# Patient Record
Sex: Male | Born: 1945 | Race: White | Hispanic: No | Smoking: Never smoker
Health system: Southern US, Community
[De-identification: ages and names within clinical notes are randomized; demographics above are authoritative.]

## PROBLEM LIST (undated history)

## (undated) DIAGNOSIS — C9110 Chronic lymphocytic leukemia of B-cell type not having achieved remission: Secondary | ICD-10-CM

## (undated) DIAGNOSIS — N529 Male erectile dysfunction, unspecified: Secondary | ICD-10-CM

## (undated) DIAGNOSIS — I10 Essential (primary) hypertension: Secondary | ICD-10-CM

## (undated) DIAGNOSIS — M1990 Unspecified osteoarthritis, unspecified site: Secondary | ICD-10-CM

## (undated) DIAGNOSIS — I2699 Other pulmonary embolism without acute cor pulmonale: Secondary | ICD-10-CM

## (undated) DIAGNOSIS — D689 Coagulation defect, unspecified: Secondary | ICD-10-CM

## (undated) DIAGNOSIS — G8929 Other chronic pain: Secondary | ICD-10-CM

## (undated) DIAGNOSIS — M79671 Pain in right foot: Secondary | ICD-10-CM

## (undated) DIAGNOSIS — E785 Hyperlipidemia, unspecified: Secondary | ICD-10-CM

## (undated) DIAGNOSIS — I319 Disease of pericardium, unspecified: Secondary | ICD-10-CM

## (undated) DIAGNOSIS — Z86718 Personal history of other venous thrombosis and embolism: Secondary | ICD-10-CM

## (undated) DIAGNOSIS — I8390 Asymptomatic varicose veins of unspecified lower extremity: Secondary | ICD-10-CM

## (undated) DIAGNOSIS — I824Z9 Acute embolism and thrombosis of unspecified deep veins of unspecified distal lower extremity: Secondary | ICD-10-CM

## (undated) HISTORY — DX: Male erectile dysfunction, unspecified: N52.9

## (undated) HISTORY — DX: Coagulation defect, unspecified: D68.9

## (undated) HISTORY — DX: Disease of pericardium, unspecified: I31.9

## (undated) HISTORY — PX: INGUINAL HERNIA REPAIR: SUR1180

## (undated) HISTORY — DX: Personal history of other venous thrombosis and embolism: Z86.718

## (undated) HISTORY — DX: Essential (primary) hypertension: I10

## (undated) HISTORY — DX: Other chronic pain: G89.29

## (undated) HISTORY — DX: Other pulmonary embolism without acute cor pulmonale: I26.99

## (undated) HISTORY — PX: OTHER SURGICAL HISTORY: SHX169

## (undated) HISTORY — DX: Chronic lymphocytic leukemia of B-cell type not having achieved remission: C91.10

## (undated) HISTORY — DX: Pain in right foot: M79.671

## (undated) HISTORY — DX: Hyperlipidemia, unspecified: E78.5

## (undated) HISTORY — DX: Unspecified osteoarthritis, unspecified site: M19.90

## (undated) HISTORY — PX: COLONOSCOPY: SHX174

---

## 2004-10-15 ENCOUNTER — Ambulatory Visit: Payer: Self-pay | Admitting: Oncology

## 2004-10-16 ENCOUNTER — Ambulatory Visit: Admission: RE | Admit: 2004-10-16 | Discharge: 2004-10-16 | Payer: Self-pay | Admitting: Oncology

## 2004-12-03 ENCOUNTER — Ambulatory Visit: Payer: Self-pay | Admitting: Oncology

## 2005-02-03 ENCOUNTER — Ambulatory Visit: Payer: Self-pay | Admitting: Oncology

## 2005-04-07 ENCOUNTER — Ambulatory Visit: Payer: Self-pay | Admitting: Oncology

## 2005-06-09 ENCOUNTER — Ambulatory Visit: Payer: Self-pay | Admitting: Oncology

## 2005-07-16 ENCOUNTER — Ambulatory Visit: Admission: RE | Admit: 2005-07-16 | Discharge: 2005-07-16 | Payer: Self-pay | Admitting: Oncology

## 2005-08-05 ENCOUNTER — Ambulatory Visit: Payer: Self-pay | Admitting: Oncology

## 2005-10-01 ENCOUNTER — Ambulatory Visit: Payer: Self-pay | Admitting: Oncology

## 2005-12-06 ENCOUNTER — Ambulatory Visit: Payer: Self-pay | Admitting: Oncology

## 2006-02-03 ENCOUNTER — Ambulatory Visit: Payer: Self-pay | Admitting: Oncology

## 2006-03-04 LAB — PROTIME-INR
INR: 2.4 (ref 2.00–3.50)
Protime: 18.8 Seconds (ref 10.6–13.4)

## 2006-03-29 ENCOUNTER — Ambulatory Visit: Payer: Self-pay | Admitting: Oncology

## 2006-04-04 LAB — PROTIME-INR
INR: 2.4 (ref 2.00–3.50)
Protime: 18.9 Seconds — ABNORMAL HIGH (ref 10.6–13.4)

## 2006-05-09 LAB — PROTIME-INR
INR: 2.3 (ref 2.00–3.50)
Protime: 18.6 Seconds — ABNORMAL HIGH (ref 10.6–13.4)

## 2006-05-25 ENCOUNTER — Ambulatory Visit: Payer: Self-pay | Admitting: Oncology

## 2006-06-02 LAB — PROTIME-INR
INR: 2.1 (ref 2.00–3.50)
Protime: 17.8 Seconds — ABNORMAL HIGH (ref 10.6–13.4)

## 2006-07-13 ENCOUNTER — Ambulatory Visit: Payer: Self-pay | Admitting: Oncology

## 2006-07-15 LAB — PROTIME-INR
INR: 2.3 (ref 2.00–3.50)
Protime: 27.6 Seconds — ABNORMAL HIGH (ref 10.6–13.4)

## 2006-08-12 LAB — PROTIME-INR
INR: 1.9 — ABNORMAL LOW (ref 2.00–3.50)
Protime: 22.8 Seconds — ABNORMAL HIGH (ref 10.6–13.4)

## 2006-09-07 ENCOUNTER — Ambulatory Visit: Payer: Self-pay | Admitting: Oncology

## 2006-09-09 LAB — PROTIME-INR
INR: 2.8 (ref 2.00–3.50)
Protime: 33.6 Seconds — ABNORMAL HIGH (ref 10.6–13.4)

## 2006-09-30 LAB — PROTIME-INR
INR: 2.9 (ref 2.00–3.50)
Protime: 34.8 Seconds — ABNORMAL HIGH (ref 10.6–13.4)

## 2006-10-21 LAB — PROTIME-INR
INR: 3 (ref 2.00–3.50)
Protime: 36 Seconds — ABNORMAL HIGH (ref 10.6–13.4)

## 2006-11-01 ENCOUNTER — Ambulatory Visit: Payer: Self-pay | Admitting: Oncology

## 2006-11-04 LAB — PROTIME-INR
INR: 2.1 (ref 2.00–3.50)
Protime: 25.2 Seconds — ABNORMAL HIGH (ref 10.6–13.4)

## 2006-11-30 LAB — PROTIME-INR
INR: 3.8 — ABNORMAL HIGH (ref 2.00–3.50)
Protime: 45.6 Seconds — ABNORMAL HIGH (ref 10.6–13.4)

## 2006-12-02 ENCOUNTER — Ambulatory Visit: Payer: Self-pay | Admitting: Oncology

## 2006-12-02 LAB — PROTIME-INR
INR: 1.7 — ABNORMAL LOW (ref 2.00–3.50)
Protime: 20.4 Seconds — ABNORMAL HIGH (ref 10.6–13.4)

## 2006-12-14 LAB — PROTIME-INR
INR: 2.5 (ref 2.00–3.50)
Protime: 30 Seconds — ABNORMAL HIGH (ref 10.6–13.4)

## 2007-01-05 ENCOUNTER — Ambulatory Visit (HOSPITAL_COMMUNITY): Admission: RE | Admit: 2007-01-05 | Discharge: 2007-01-06 | Payer: Self-pay | Admitting: General Surgery

## 2007-01-05 ENCOUNTER — Encounter (INDEPENDENT_AMBULATORY_CARE_PROVIDER_SITE_OTHER): Payer: Self-pay | Admitting: Specialist

## 2007-01-06 ENCOUNTER — Ambulatory Visit: Payer: Self-pay | Admitting: Oncology

## 2007-01-09 LAB — PROTIME-INR
INR: 1.4 — ABNORMAL LOW (ref 2.00–3.50)
Protime: 16.8 Seconds — ABNORMAL HIGH (ref 10.6–13.4)

## 2007-01-11 LAB — PROTIME-INR
INR: 1.5 — ABNORMAL LOW (ref 2.00–3.50)
Protime: 18 Seconds — ABNORMAL HIGH (ref 10.6–13.4)

## 2007-01-13 ENCOUNTER — Ambulatory Visit: Payer: Self-pay | Admitting: Oncology

## 2007-01-18 LAB — PROTIME-INR
INR: 2.5 (ref 2.00–3.50)
Protime: 30 Seconds — ABNORMAL HIGH (ref 10.6–13.4)

## 2007-02-03 LAB — PROTIME-INR
INR: 1.9 — ABNORMAL LOW (ref 2.00–3.50)
Protime: 22.8 Seconds — ABNORMAL HIGH (ref 10.6–13.4)

## 2007-02-20 LAB — PROTIME-INR
INR: 2.4 (ref 2.00–3.50)
Protime: 28.8 Seconds — ABNORMAL HIGH (ref 10.6–13.4)

## 2007-04-03 ENCOUNTER — Ambulatory Visit: Payer: Self-pay | Admitting: Oncology

## 2007-04-05 LAB — PROTIME-INR
INR: 2.2 (ref 2.00–3.50)
Protime: 26.4 Seconds — ABNORMAL HIGH (ref 10.6–13.4)

## 2007-05-10 LAB — PROTIME-INR
INR: 2.4 (ref 2.00–3.50)
Protime: 28.8 Seconds — ABNORMAL HIGH (ref 10.6–13.4)

## 2007-06-07 ENCOUNTER — Ambulatory Visit: Payer: Self-pay | Admitting: Oncology

## 2007-06-09 LAB — PROTIME-INR
INR: 2.7 (ref 2.00–3.50)
Protime: 32.4 Seconds — ABNORMAL HIGH (ref 10.6–13.4)

## 2007-07-07 LAB — PROTIME-INR
INR: 2 (ref 2.00–3.50)
Protime: 24 Seconds — ABNORMAL HIGH (ref 10.6–13.4)

## 2007-08-08 ENCOUNTER — Ambulatory Visit: Payer: Self-pay | Admitting: Oncology

## 2007-08-10 LAB — PROTIME-INR
INR: 2.8 (ref 2.00–3.50)
Protime: 33.6 Seconds — ABNORMAL HIGH (ref 10.6–13.4)

## 2007-09-07 LAB — PROTIME-INR
INR: 2.5 (ref 2.00–3.50)
Protime: 30 Seconds — ABNORMAL HIGH (ref 10.6–13.4)

## 2007-10-03 ENCOUNTER — Ambulatory Visit: Payer: Self-pay | Admitting: Oncology

## 2007-10-05 LAB — PROTIME-INR
INR: 2.4 (ref 2.00–3.50)
Protime: 28.8 Seconds — ABNORMAL HIGH (ref 10.6–13.4)

## 2007-11-02 LAB — PROTIME-INR
INR: 2.9 (ref 2.00–3.50)
Protime: 34.8 Seconds — ABNORMAL HIGH (ref 10.6–13.4)

## 2007-11-29 ENCOUNTER — Ambulatory Visit: Payer: Self-pay | Admitting: Oncology

## 2007-12-04 LAB — PROTIME-INR
INR: 2.4 (ref 2.00–3.50)
Protime: 28.8 Seconds — ABNORMAL HIGH (ref 10.6–13.4)

## 2008-01-02 LAB — PROTIME-INR
INR: 2 (ref 2.00–3.50)
Protime: 24 Seconds — ABNORMAL HIGH (ref 10.6–13.4)

## 2008-02-02 ENCOUNTER — Ambulatory Visit: Payer: Self-pay | Admitting: Oncology

## 2008-02-06 LAB — PROTIME-INR
INR: 2.6 (ref 2.00–3.50)
Protime: 31.2 Seconds — ABNORMAL HIGH (ref 10.6–13.4)

## 2008-03-29 ENCOUNTER — Ambulatory Visit: Payer: Self-pay | Admitting: Oncology

## 2008-04-02 LAB — PROTIME-INR
INR: 2.3 (ref 2.00–3.50)
Protime: 27.6 Seconds — ABNORMAL HIGH (ref 10.6–13.4)

## 2008-05-14 ENCOUNTER — Ambulatory Visit: Payer: Self-pay | Admitting: Oncology

## 2008-05-14 LAB — PROTIME-INR
INR: 3.2 (ref 2.00–3.50)
Protime: 38.4 Seconds — ABNORMAL HIGH (ref 10.6–13.4)

## 2008-06-04 LAB — PROTIME-INR
INR: 2.7 (ref 2.00–3.50)
Protime: 32.4 Seconds — ABNORMAL HIGH (ref 10.6–13.4)

## 2008-07-02 ENCOUNTER — Ambulatory Visit: Payer: Self-pay | Admitting: Oncology

## 2008-07-02 LAB — PROTIME-INR
INR: 2.2 (ref 2.00–3.50)
Protime: 26.4 Seconds — ABNORMAL HIGH (ref 10.6–13.4)

## 2008-08-09 LAB — PROTIME-INR
INR: 3.1 (ref 2.00–3.50)
Protime: 37.2 Seconds — ABNORMAL HIGH (ref 10.6–13.4)

## 2008-09-02 ENCOUNTER — Ambulatory Visit: Payer: Self-pay | Admitting: Oncology

## 2008-09-04 LAB — PROTIME-INR
INR: 3.1 (ref 2.00–3.50)
Protime: 37.2 Seconds — ABNORMAL HIGH (ref 10.6–13.4)

## 2008-10-02 LAB — PROTIME-INR
INR: 3.2 (ref 2.00–3.50)
Protime: 38.4 Seconds — ABNORMAL HIGH (ref 10.6–13.4)

## 2008-10-28 ENCOUNTER — Ambulatory Visit: Payer: Self-pay | Admitting: Oncology

## 2008-10-30 LAB — PROTIME-INR
INR: 3.9 — ABNORMAL HIGH (ref 2.00–3.50)
Protime: 46.8 Seconds — ABNORMAL HIGH (ref 10.6–13.4)

## 2008-11-28 LAB — PROTIME-INR
INR: 3.2 (ref 2.00–3.50)
Protime: 38.4 Seconds — ABNORMAL HIGH (ref 10.6–13.4)

## 2008-12-30 ENCOUNTER — Ambulatory Visit: Payer: Self-pay | Admitting: Oncology

## 2009-01-01 LAB — CBC WITH DIFFERENTIAL/PLATELET
BASO%: 0.6 % (ref 0.0–2.0)
Basophils Absolute: 0 10*3/uL (ref 0.0–0.1)
EOS%: 4.6 % (ref 0.0–7.0)
Eosinophils Absolute: 0.3 10*3/uL (ref 0.0–0.5)
HCT: 43.3 % (ref 38.7–49.9)
HGB: 15.1 g/dL (ref 13.0–17.1)
LYMPH%: 26.5 % (ref 14.0–48.0)
MCH: 31.9 pg (ref 28.0–33.4)
MCHC: 35 g/dL (ref 32.0–35.9)
MCV: 91.3 fL (ref 81.6–98.0)
MONO#: 0.6 10*3/uL (ref 0.1–0.9)
MONO%: 9 % (ref 0.0–13.0)
NEUT#: 3.9 10*3/uL (ref 1.5–6.5)
NEUT%: 59.3 % (ref 40.0–75.0)
Platelets: 137 10*3/uL — ABNORMAL LOW (ref 145–400)
RBC: 4.74 10*6/uL (ref 4.20–5.71)
RDW: 12.3 % (ref 11.2–14.6)
WBC: 6.6 10*3/uL (ref 4.0–10.0)
lymph#: 1.7 10*3/uL (ref 0.9–3.3)

## 2009-01-01 LAB — PROTIME-INR
INR: 2.6 (ref 2.00–3.50)
Protime: 31.2 Seconds — ABNORMAL HIGH (ref 10.6–13.4)

## 2009-01-29 LAB — CBC WITH DIFFERENTIAL/PLATELET
BASO%: 0.4 % (ref 0.0–2.0)
Basophils Absolute: 0 10*3/uL (ref 0.0–0.1)
EOS%: 5.3 % (ref 0.0–7.0)
Eosinophils Absolute: 0.4 10*3/uL (ref 0.0–0.5)
HCT: 45.7 % (ref 38.4–49.9)
HGB: 16.2 g/dL (ref 13.0–17.1)
LYMPH%: 26.6 % (ref 14.0–49.0)
MCH: 30.9 pg (ref 27.2–33.4)
MCHC: 35.4 g/dL (ref 32.0–36.0)
MCV: 87.2 fL (ref 79.3–98.0)
MONO#: 0.7 10*3/uL (ref 0.1–0.9)
MONO%: 9.8 % (ref 0.0–14.0)
NEUT#: 4.2 10*3/uL (ref 1.5–6.5)
NEUT%: 57.9 % (ref 39.0–75.0)
Platelets: 118 10*3/uL — ABNORMAL LOW (ref 140–400)
RBC: 5.24 10*6/uL (ref 4.20–5.82)
RDW: 12 % (ref 11.0–14.6)
WBC: 7.2 10*3/uL (ref 4.0–10.3)
lymph#: 1.9 10*3/uL (ref 0.9–3.3)
nRBC: 0 % (ref 0–0)

## 2009-01-29 LAB — PROTIME-INR
INR: 2.7 (ref 2.00–3.50)
Protime: 32.4 Seconds — ABNORMAL HIGH (ref 10.6–13.4)

## 2009-02-20 ENCOUNTER — Ambulatory Visit: Payer: Self-pay | Admitting: Oncology

## 2009-02-24 LAB — PROTIME-INR
INR: 2.2 (ref 2.00–3.50)
Protime: 26.4 Seconds — ABNORMAL HIGH (ref 10.6–13.4)

## 2009-04-02 LAB — PROTIME-INR
INR: 2.9 (ref 2.00–3.50)
Protime: 34.8 Seconds — ABNORMAL HIGH (ref 10.6–13.4)

## 2009-04-29 ENCOUNTER — Ambulatory Visit: Payer: Self-pay | Admitting: Oncology

## 2009-05-01 LAB — PROTIME-INR
INR: 2.3 (ref 2.00–3.50)
Protime: 27.6 Seconds — ABNORMAL HIGH (ref 10.6–13.4)

## 2009-05-30 ENCOUNTER — Ambulatory Visit: Payer: Self-pay | Admitting: Oncology

## 2009-06-04 LAB — CBC WITH DIFFERENTIAL/PLATELET
BASO%: 0.3 % (ref 0.0–2.0)
Basophils Absolute: 0 10*3/uL (ref 0.0–0.1)
EOS%: 4.3 % (ref 0.0–7.0)
Eosinophils Absolute: 0.3 10*3/uL (ref 0.0–0.5)
HCT: 41.9 % (ref 38.4–49.9)
HGB: 14.8 g/dL (ref 13.0–17.1)
LYMPH%: 31.9 % (ref 14.0–49.0)
MCH: 31.2 pg (ref 27.2–33.4)
MCHC: 35.3 g/dL (ref 32.0–36.0)
MCV: 88.2 fL (ref 79.3–98.0)
MONO#: 0.6 10*3/uL (ref 0.1–0.9)
MONO%: 8.9 % (ref 0.0–14.0)
NEUT#: 3.4 10*3/uL (ref 1.5–6.5)
NEUT%: 54.6 % (ref 39.0–75.0)
Platelets: 112 10*3/uL — ABNORMAL LOW (ref 140–400)
RBC: 4.75 10*6/uL (ref 4.20–5.82)
RDW: 12.4 % (ref 11.0–14.6)
WBC: 6.3 10*3/uL (ref 4.0–10.3)
lymph#: 2 10*3/uL (ref 0.9–3.3)

## 2009-06-04 LAB — CHCC SMEAR

## 2009-06-04 LAB — PROTIME-INR
INR: 1.7 — ABNORMAL LOW (ref 2.00–3.50)
Protime: 20.4 Seconds — ABNORMAL HIGH (ref 10.6–13.4)

## 2009-07-01 ENCOUNTER — Ambulatory Visit: Payer: Self-pay | Admitting: Oncology

## 2009-07-03 LAB — PROTIME-INR
INR: 2.3 (ref 2.00–3.50)
Protime: 27.6 Seconds — ABNORMAL HIGH (ref 10.6–13.4)

## 2009-07-31 ENCOUNTER — Ambulatory Visit: Payer: Self-pay | Admitting: Oncology

## 2009-08-05 LAB — PROTIME-INR
INR: 2.5 (ref 2.00–3.50)
Protime: 30 Seconds — ABNORMAL HIGH (ref 10.6–13.4)

## 2009-09-02 ENCOUNTER — Encounter: Payer: Self-pay | Admitting: Oncology

## 2009-09-02 ENCOUNTER — Ambulatory Visit: Admission: RE | Admit: 2009-09-02 | Discharge: 2009-09-02 | Payer: Self-pay | Admitting: Oncology

## 2009-09-02 ENCOUNTER — Ambulatory Visit: Payer: Self-pay | Admitting: Vascular Surgery

## 2009-09-02 ENCOUNTER — Ambulatory Visit: Payer: Self-pay | Admitting: Oncology

## 2009-09-04 LAB — PROTIME-INR
INR: 1.8 — ABNORMAL LOW (ref 2.00–3.50)
Protime: 21.6 Seconds — ABNORMAL HIGH (ref 10.6–13.4)

## 2009-10-03 ENCOUNTER — Ambulatory Visit: Payer: Self-pay | Admitting: Oncology

## 2009-10-07 LAB — PROTIME-INR
INR: 2.7 (ref 2.00–3.50)
Protime: 32.4 Seconds — ABNORMAL HIGH (ref 10.6–13.4)

## 2009-11-03 ENCOUNTER — Ambulatory Visit: Payer: Self-pay | Admitting: Oncology

## 2009-11-06 LAB — PROTIME-INR
INR: 3.5 (ref 2.00–3.50)
Protime: 42 Seconds — ABNORMAL HIGH (ref 10.6–13.4)

## 2009-11-20 LAB — PROTIME-INR
INR: 3.1 (ref 2.00–3.50)
Protime: 37.2 Seconds — ABNORMAL HIGH (ref 10.6–13.4)

## 2009-11-20 LAB — CBC WITH DIFFERENTIAL/PLATELET
BASO%: 0.5 % (ref 0.0–2.0)
Basophils Absolute: 0 10*3/uL (ref 0.0–0.1)
EOS%: 4.2 % (ref 0.0–7.0)
Eosinophils Absolute: 0.3 10*3/uL (ref 0.0–0.5)
HCT: 43 % (ref 38.4–49.9)
HGB: 15 g/dL (ref 13.0–17.1)
LYMPH%: 30.6 % (ref 14.0–49.0)
MCH: 31.8 pg (ref 27.2–33.4)
MCHC: 34.9 g/dL (ref 32.0–36.0)
MCV: 91.3 fL (ref 79.3–98.0)
MONO#: 0.5 10*3/uL (ref 0.1–0.9)
MONO%: 6.6 % (ref 0.0–14.0)
NEUT#: 4.7 10*3/uL (ref 1.5–6.5)
NEUT%: 58.1 % (ref 39.0–75.0)
Platelets: 121 10*3/uL — ABNORMAL LOW (ref 140–400)
RBC: 4.71 10*6/uL (ref 4.20–5.82)
RDW: 12.6 % (ref 11.0–14.6)
WBC: 8 10*3/uL (ref 4.0–10.3)
lymph#: 2.5 10*3/uL (ref 0.9–3.3)
nRBC: 0 % (ref 0–0)

## 2009-12-04 ENCOUNTER — Ambulatory Visit: Payer: Self-pay | Admitting: Oncology

## 2009-12-08 LAB — PROTIME-INR
INR: 2.9 (ref 2.00–3.50)
Protime: 34.8 Seconds — ABNORMAL HIGH (ref 10.6–13.4)

## 2010-01-06 ENCOUNTER — Ambulatory Visit: Payer: Self-pay | Admitting: Oncology

## 2010-01-08 LAB — PROTIME-INR
INR: 2.8 (ref 2.00–3.50)
Protime: 33.6 s — ABNORMAL HIGH (ref 10.6–13.4)

## 2010-02-03 LAB — PROTIME-INR
INR: 1.9 — ABNORMAL LOW (ref 2.00–3.50)
Protime: 22.8 Seconds — ABNORMAL HIGH (ref 10.6–13.4)

## 2010-02-03 LAB — CBC WITH DIFFERENTIAL/PLATELET
BASO%: 0.8 % (ref 0.0–2.0)
Basophils Absolute: 0.1 10*3/uL (ref 0.0–0.1)
EOS%: 4.4 % (ref 0.0–7.0)
Eosinophils Absolute: 0.3 10*3/uL (ref 0.0–0.5)
HCT: 46.1 % (ref 38.4–49.9)
HGB: 16.1 g/dL (ref 13.0–17.1)
LYMPH%: 29.2 % (ref 14.0–49.0)
MCH: 32 pg (ref 27.2–33.4)
MCHC: 34.9 g/dL (ref 32.0–36.0)
MCV: 91.7 fL (ref 79.3–98.0)
MONO#: 0.7 10*3/uL (ref 0.1–0.9)
MONO%: 9.2 % (ref 0.0–14.0)
NEUT#: 4.3 10*3/uL (ref 1.5–6.5)
NEUT%: 56.4 % (ref 39.0–75.0)
Platelets: 125 10*3/uL — ABNORMAL LOW (ref 140–400)
RBC: 5.03 10*6/uL (ref 4.20–5.82)
RDW: 12.3 % (ref 11.0–14.6)
WBC: 7.5 10*3/uL (ref 4.0–10.3)
lymph#: 2.2 10*3/uL (ref 0.9–3.3)

## 2010-02-26 ENCOUNTER — Ambulatory Visit: Payer: Self-pay | Admitting: Oncology

## 2010-03-02 LAB — PROTIME-INR
INR: 1.9 — ABNORMAL LOW (ref 2.00–3.50)
Protime: 22.8 Seconds — ABNORMAL HIGH (ref 10.6–13.4)

## 2010-03-30 ENCOUNTER — Ambulatory Visit: Payer: Self-pay | Admitting: Oncology

## 2010-03-30 LAB — PROTIME-INR
INR: 2.4 (ref 2.00–3.50)
Protime: 28.8 Seconds — ABNORMAL HIGH (ref 10.6–13.4)

## 2010-04-29 ENCOUNTER — Ambulatory Visit: Payer: Self-pay | Admitting: Oncology

## 2010-04-30 LAB — PROTIME-INR
INR: 2.8 (ref 2.00–3.50)
Protime: 33.6 Seconds — ABNORMAL HIGH (ref 10.6–13.4)

## 2010-04-30 LAB — CBC WITH DIFFERENTIAL/PLATELET
BASO%: 0.5 % (ref 0.0–2.0)
Basophils Absolute: 0 10*3/uL (ref 0.0–0.1)
EOS%: 4.9 % (ref 0.0–7.0)
Eosinophils Absolute: 0.4 10*3/uL (ref 0.0–0.5)
HCT: 42.2 % (ref 38.4–49.9)
HGB: 14.6 g/dL (ref 13.0–17.1)
LYMPH%: 31.9 % (ref 14.0–49.0)
MCH: 31.2 pg (ref 27.2–33.4)
MCHC: 34.6 g/dL (ref 32.0–36.0)
MCV: 90.2 fL (ref 79.3–98.0)
MONO#: 0.6 10*3/uL (ref 0.1–0.9)
MONO%: 8.2 % (ref 0.0–14.0)
NEUT#: 4 10*3/uL (ref 1.5–6.5)
NEUT%: 54.5 % (ref 39.0–75.0)
Platelets: 104 10*3/uL — ABNORMAL LOW (ref 140–400)
RBC: 4.68 10*6/uL (ref 4.20–5.82)
RDW: 12.5 % (ref 11.0–14.6)
WBC: 7.4 10*3/uL (ref 4.0–10.3)
lymph#: 2.4 10*3/uL (ref 0.9–3.3)
nRBC: 0 % (ref 0–0)

## 2010-06-02 ENCOUNTER — Ambulatory Visit: Payer: Self-pay | Admitting: Oncology

## 2010-06-04 LAB — CBC WITH DIFFERENTIAL/PLATELET
BASO%: 0.5 % (ref 0.0–2.0)
Basophils Absolute: 0 10*3/uL (ref 0.0–0.1)
EOS%: 3.9 % (ref 0.0–7.0)
Eosinophils Absolute: 0.3 10*3/uL (ref 0.0–0.5)
HCT: 44.9 % (ref 38.4–49.9)
HGB: 15.6 g/dL (ref 13.0–17.1)
LYMPH%: 30.5 % (ref 14.0–49.0)
MCH: 31.5 pg (ref 27.2–33.4)
MCHC: 34.7 g/dL (ref 32.0–36.0)
MCV: 90.7 fL (ref 79.3–98.0)
MONO#: 0.6 10*3/uL (ref 0.1–0.9)
MONO%: 7.3 % (ref 0.0–14.0)
NEUT#: 4.7 10*3/uL (ref 1.5–6.5)
NEUT%: 57.8 % (ref 39.0–75.0)
Platelets: 121 10*3/uL — ABNORMAL LOW (ref 140–400)
RBC: 4.95 10*6/uL (ref 4.20–5.82)
RDW: 12.6 % (ref 11.0–14.6)
WBC: 8 10*3/uL (ref 4.0–10.3)
lymph#: 2.5 10*3/uL (ref 0.9–3.3)
nRBC: 0 % (ref 0–0)

## 2010-06-04 LAB — PROTIME-INR
INR: 2 (ref 2.00–3.50)
Protime: 24 Seconds — ABNORMAL HIGH (ref 10.6–13.4)

## 2010-07-02 ENCOUNTER — Ambulatory Visit: Payer: Self-pay | Admitting: Oncology

## 2010-07-02 LAB — PROTIME-INR
INR: 2.5 (ref 2.00–3.50)
Protime: 30 Seconds — ABNORMAL HIGH (ref 10.6–13.4)

## 2010-08-06 ENCOUNTER — Ambulatory Visit: Payer: Self-pay | Admitting: Oncology

## 2010-08-06 LAB — CBC WITH DIFFERENTIAL/PLATELET
BASO%: 0.3 % (ref 0.0–2.0)
Basophils Absolute: 0 10*3/uL (ref 0.0–0.1)
EOS%: 4.1 % (ref 0.0–7.0)
Eosinophils Absolute: 0.4 10*3/uL (ref 0.0–0.5)
HCT: 46.7 % (ref 38.4–49.9)
HGB: 16.2 g/dL (ref 13.0–17.1)
LYMPH%: 26 % (ref 14.0–49.0)
MCH: 31.6 pg (ref 27.2–33.4)
MCHC: 34.7 g/dL (ref 32.0–36.0)
MCV: 91.2 fL (ref 79.3–98.0)
MONO#: 0.6 10*3/uL (ref 0.1–0.9)
MONO%: 5.7 % (ref 0.0–14.0)
NEUT#: 6.2 10*3/uL (ref 1.5–6.5)
NEUT%: 63.9 % (ref 39.0–75.0)
Platelets: 123 10*3/uL — ABNORMAL LOW (ref 140–400)
RBC: 5.12 10*6/uL (ref 4.20–5.82)
RDW: 12.5 % (ref 11.0–14.6)
WBC: 9.6 10*3/uL (ref 4.0–10.3)
lymph#: 2.5 10*3/uL (ref 0.9–3.3)
nRBC: 0 % (ref 0–0)

## 2010-08-06 LAB — PROTIME-INR
INR: 2.7 (ref 2.00–3.50)
Protime: 32.4 Seconds — ABNORMAL HIGH (ref 10.6–13.4)

## 2010-08-31 LAB — PROTIME-INR
INR: 2.1 (ref 2.00–3.50)
Protime: 25.2 Seconds — ABNORMAL HIGH (ref 10.6–13.4)

## 2010-09-29 ENCOUNTER — Ambulatory Visit: Payer: Self-pay | Admitting: Oncology

## 2010-10-01 LAB — PROTIME-INR
INR: 2.2 (ref 2.00–3.50)
Protime: 26.4 Seconds — ABNORMAL HIGH (ref 10.6–13.4)

## 2010-11-03 ENCOUNTER — Ambulatory Visit: Payer: Self-pay | Admitting: Oncology

## 2010-11-05 LAB — PROTIME-INR
INR: 1.9 — ABNORMAL LOW (ref 2.00–3.50)
Protime: 22.8 Seconds — ABNORMAL HIGH (ref 10.6–13.4)

## 2010-11-05 LAB — CBC WITH DIFFERENTIAL/PLATELET
BASO%: 0.8 % (ref 0.0–2.0)
Basophils Absolute: 0.1 10*3/uL (ref 0.0–0.1)
EOS%: 6.3 % (ref 0.0–7.0)
Eosinophils Absolute: 0.6 10*3/uL — ABNORMAL HIGH (ref 0.0–0.5)
HCT: 47.4 % (ref 38.4–49.9)
HGB: 16.5 g/dL (ref 13.0–17.1)
LYMPH%: 26.3 % (ref 14.0–49.0)
MCH: 31.8 pg (ref 27.2–33.4)
MCHC: 34.8 g/dL (ref 32.0–36.0)
MCV: 91.3 fL (ref 79.3–98.0)
MONO#: 0.7 10*3/uL (ref 0.1–0.9)
MONO%: 7.2 % (ref 0.0–14.0)
NEUT#: 5.4 10*3/uL (ref 1.5–6.5)
NEUT%: 59.4 % (ref 39.0–75.0)
Platelets: 120 10*3/uL — ABNORMAL LOW (ref 140–400)
RBC: 5.19 10*6/uL (ref 4.20–5.82)
RDW: 12.3 % (ref 11.0–14.6)
WBC: 9.1 10*3/uL (ref 4.0–10.3)
lymph#: 2.4 10*3/uL (ref 0.9–3.3)
nRBC: 0 % (ref 0–0)

## 2010-12-02 ENCOUNTER — Ambulatory Visit: Payer: Self-pay | Admitting: Cardiovascular Disease

## 2010-12-04 ENCOUNTER — Ambulatory Visit: Payer: Self-pay | Admitting: Oncology

## 2010-12-08 LAB — PROTIME-INR
INR: 2 (ref 2.00–3.50)
Protime: 24 Seconds — ABNORMAL HIGH (ref 10.6–13.4)

## 2011-01-07 ENCOUNTER — Telehealth (INDEPENDENT_AMBULATORY_CARE_PROVIDER_SITE_OTHER): Payer: Self-pay | Admitting: Radiology

## 2011-01-11 ENCOUNTER — Encounter: Payer: Self-pay | Admitting: *Deleted

## 2011-01-11 ENCOUNTER — Encounter (INDEPENDENT_AMBULATORY_CARE_PROVIDER_SITE_OTHER): Payer: Self-pay | Admitting: *Deleted

## 2011-01-11 ENCOUNTER — Encounter: Payer: Self-pay | Admitting: Cardiovascular Disease

## 2011-01-11 ENCOUNTER — Ambulatory Visit (HOSPITAL_COMMUNITY): Payer: MEDICARE | Attending: Cardiovascular Disease

## 2011-01-11 DIAGNOSIS — R948 Abnormal results of function studies of other organs and systems: Secondary | ICD-10-CM | POA: Insufficient documentation

## 2011-01-11 DIAGNOSIS — I4949 Other premature depolarization: Secondary | ICD-10-CM

## 2011-01-11 DIAGNOSIS — R9430 Abnormal result of cardiovascular function study, unspecified: Secondary | ICD-10-CM

## 2011-01-11 DIAGNOSIS — I491 Atrial premature depolarization: Secondary | ICD-10-CM

## 2011-01-12 ENCOUNTER — Other Ambulatory Visit: Payer: Self-pay | Admitting: Oncology

## 2011-01-12 ENCOUNTER — Encounter (HOSPITAL_BASED_OUTPATIENT_CLINIC_OR_DEPARTMENT_OTHER): Payer: MEDICARE | Admitting: Oncology

## 2011-01-12 DIAGNOSIS — Z7901 Long term (current) use of anticoagulants: Secondary | ICD-10-CM

## 2011-01-12 DIAGNOSIS — D696 Thrombocytopenia, unspecified: Secondary | ICD-10-CM

## 2011-01-12 DIAGNOSIS — R42 Dizziness and giddiness: Secondary | ICD-10-CM

## 2011-01-12 DIAGNOSIS — Z86718 Personal history of other venous thrombosis and embolism: Secondary | ICD-10-CM

## 2011-01-12 LAB — PROTIME-INR
INR: 2.1 (ref 2.00–3.50)
Protime: 25.2 Seconds — ABNORMAL HIGH (ref 10.6–13.4)

## 2011-01-14 NOTE — Progress Notes (Signed)
Summary: nuc pre-procedure  Phone Note Outgoing Call   Call placed by: Harlow Asa CNMT Call placed to: Patient Reason for Call: Confirm/change Appt     Nuclear Med Background Indications for Stress Test: Evaluation for Ischemia  Indications Comments: Abn. CT scan  History: CT/MRI  History Comments: (+) coronary calcium     Nuclear Pre-Procedure Cardiac Risk Factors: Hypertension, Lipids

## 2011-01-18 ENCOUNTER — Ambulatory Visit (INDEPENDENT_AMBULATORY_CARE_PROVIDER_SITE_OTHER): Payer: MEDICARE | Admitting: Cardiovascular Disease

## 2011-01-18 DIAGNOSIS — R9439 Abnormal result of other cardiovascular function study: Secondary | ICD-10-CM

## 2011-01-18 DIAGNOSIS — Z7901 Long term (current) use of anticoagulants: Secondary | ICD-10-CM

## 2011-01-20 NOTE — Assessment & Plan Note (Signed)
Summary: Cardiology Nuclear Testing  Nuclear Med Background Indications for Stress Test: Evaluation for Ischemia  Indications Comments: Abnormal Cardiac CT   History: CT/MRI, Echo  History Comments:  ~10 yrs Echo:OK per patient; 11/11 Cardiac CT:sig. coronary calcium   Symptoms Comments: No cardiac complaints.   Nuclear Pre-Procedure Cardiac Risk Factors: Hypertension, Lipids Caffeine/Decaff Intake: None NPO After: 8:00 PM Lungs: Clear. IV 0.9% NS with Angio Cath: 18g     IV Site: R Antecubital IV Started by: Stanton Kidney, EMT-P Chest Size (in) 44     Height (in): 73 Weight (lb): 210 BMI: 27.81 Tech Comments: Atenolol held > 48 hours, per patient.   Nuclear Med Study 1 or 2 day study:  1 day     Stress Test Type:  Stress Reading MD:  Cassell Clement, MD     Referring MD:  Kristeen Miss, MD Resting Radionuclide:  Technetium 74m Tetrofosmin     Resting Radionuclide Dose:  10.9 mCi  Stress Radionuclide:  Technetium 73m Tetrofosmin     Stress Radionuclide Dose:  33 mCi   Stress Protocol Exercise Time (min):  4:46 min     Max HR:  144 bpm     Predicted Max HR:  155 bpm  Max Systolic BP: 226 mm Hg     Percent Max HR:  92.90 %     METS: 6.7 Rate Pressure Product:  66440    Stress Test Technologist:  [employer], CMA-N     Nuclear Technologist:  Domenic Polite, CNMT  Rest Procedure  Myocardial perfusion imaging was performed at rest 45 minutes following the intravenous administration of Technetium 45m Tetrofosmin.  Stress Procedure  The patient exercised for 4:46.  The patient stopped due to hypertensive response, 226/115.  He denied any chest pain.  There were no diagnostic ST-T wave changes.  There were occasional PVC's with couplets and PAC's.  Technetium 46m Tetrofosmin was injected at peak exercise and myocardial perfusion imaging was performed after a brief delay.  QPS Raw Data Images:  Normal; no motion artifact; normal heart/lung ratio. Stress Images:  Normal  homogeneous uptake in all areas of the myocardium. Rest Images:  Normal homogeneous uptake in all areas of the myocardium. Subtraction (SDS):  No evidence of ischemia. Transient Ischemic Dilatation:  1.11  (Normal <1.22)  Lung/Heart Ratio:  .30  (Normal <0.45)  Quantitative Gated Spect Images QGS EDV:  167 ml QGS ESV:  88 ml QGS EF:  47 % QGS cine images:  No segmental wall motion abnormalities.  Findings Low risk nuclear study   Evidence for LV Dysfunction LV Dysfunction    Overall Impression  Exercise Capacity: Fair exercise capacity. BP Response: Hypertensive blood pressure response. Clinical Symptoms: No chest pain ECG Impression: No significant ST segment change suggestive of ischemia. Overall Impression: Low risk stress nuclear study. Overall Impression Comments: Normal apical thinning. No reversible ischemia. Mild depression of LV systolic function with EF 47%.  Consider 2D echo to evaluate LV function further.  Appended Document: Cardiology Nuclear Testing copy sent to dr.nasher

## 2011-01-21 ENCOUNTER — Inpatient Hospital Stay (HOSPITAL_BASED_OUTPATIENT_CLINIC_OR_DEPARTMENT_OTHER)
Admission: RE | Admit: 2011-01-21 | Discharge: 2011-01-21 | Disposition: A | Discharge status: Home / Self Care | Payer: MEDICARE | Source: Ambulatory Visit | Attending: Cardiovascular Disease | Admitting: Cardiovascular Disease

## 2011-01-21 DIAGNOSIS — I2584 Coronary atherosclerosis due to calcified coronary lesion: Secondary | ICD-10-CM | POA: Insufficient documentation

## 2011-01-21 DIAGNOSIS — I2510 Atherosclerotic heart disease of native coronary artery without angina pectoris: Secondary | ICD-10-CM | POA: Insufficient documentation

## 2011-01-21 DIAGNOSIS — Z86718 Personal history of other venous thrombosis and embolism: Secondary | ICD-10-CM | POA: Insufficient documentation

## 2011-01-21 DIAGNOSIS — R9439 Abnormal result of other cardiovascular function study: Secondary | ICD-10-CM | POA: Insufficient documentation

## 2011-01-21 DIAGNOSIS — Z86711 Personal history of pulmonary embolism: Secondary | ICD-10-CM | POA: Insufficient documentation

## 2011-01-21 DIAGNOSIS — R9430 Abnormal result of cardiovascular function study, unspecified: Secondary | ICD-10-CM

## 2011-01-21 DIAGNOSIS — E7800 Pure hypercholesterolemia, unspecified: Secondary | ICD-10-CM | POA: Insufficient documentation

## 2011-01-21 HISTORY — PX: CARDIAC CATHETERIZATION: SHX172

## 2011-01-27 ENCOUNTER — Ambulatory Visit (INDEPENDENT_AMBULATORY_CARE_PROVIDER_SITE_OTHER): Payer: MEDICARE | Admitting: Nurse Practitioner

## 2011-01-27 DIAGNOSIS — I10 Essential (primary) hypertension: Secondary | ICD-10-CM

## 2011-01-28 NOTE — Procedures (Signed)
  NAMEHERSHY, FLENNER NO.:  [id number]  MEDICAL RECORD NO.:  [id number]           PATIENT TYPE:  LOCATION:                                 FACILITY:  PHYSICIAN:  Vesta Mixer, M.D. DATE OF BIRTH:  14-Nov-1946  DATE OF PROCEDURE:  01/21/2011 DATE OF DISCHARGE:                           CARDIAC CATHETERIZATION   Chad Gross is a middle-aged gentleman with a history of recurrent DVT and pulmonary emboli.  He also has a history of hypercholesterolemia.  He was referred to me when he was found to have an elevated coronary calcium score.  We performed a stress Myoview study on him, which revealed an apical defect with an ejection fraction of 47%.  We referred him for heart catheterization for further evaluation.  PROCEDURE:  Left heart catheterization with coronary angiography.  The right femoral artery was easily cannulated using modified Seldinger technique.  HEMODYNAMICS:  The LV pressure is 119/20.  Aortic pressure is 118/55.  ANGIOGRAPHY:  Left main:  The left main is fairly normal.  There is mild- to-moderate degree of calcification in the left main and extending into the LAD.  LAD:  LAD has mild calcification.  There are minor luminal irregularities in the proximal, mid, and distal LAD.  The first diagonal artery is a moderate-sized vessel and has minor luminal irregularities. There are no critical stenosis.  The second diagonal vessel is a fairly small vessel and is otherwise normal.  The left circumflex artery is a moderate-sized system.  It gives off a fairly high obtuse marginal artery, which is normal.  The distal circumflex artery is unremarkable and provides flow to a small posterolateral branch.  Right coronary artery:  The right coronary artery is extremely large and is dominant.  There are minor luminal irregularities throughout the RCA, but no discrete stenoses.  The posterior descending artery and the posterior lateral segment  artery are normal.  Left ventriculogram:  The left ventriculogram was performed in a 30 RAO position.  It reveals overall well-preserved left ventricular systolic function.  Ejection fraction appears to be 50-55%.  The apex contracts normally.  COMPLICATIONS:  None.  CONCLUSIONS: 1. Minor coronary artery irregularities. 2. Normal left ventricular systolic function.  We will continue with     medical therapy.     Vesta Mixer, M.D.     PJN/MEDQ  D:  01/21/2011  T:  01/21/2011  Job:  [id number]  cc:   Gloriajean Dell. Andrey Campanile, M.D.  Electronically Signed by Kristeen Miss M.D. on 01/28/2011 06:16:50 PM

## 2011-02-02 ENCOUNTER — Ambulatory Visit: Payer: MEDICARE | Admitting: *Deleted

## 2011-02-09 ENCOUNTER — Encounter (HOSPITAL_BASED_OUTPATIENT_CLINIC_OR_DEPARTMENT_OTHER): Payer: MEDICARE | Admitting: Oncology

## 2011-02-09 ENCOUNTER — Other Ambulatory Visit: Payer: Self-pay | Admitting: Oncology

## 2011-02-09 DIAGNOSIS — Z7901 Long term (current) use of anticoagulants: Secondary | ICD-10-CM

## 2011-02-09 DIAGNOSIS — Z86718 Personal history of other venous thrombosis and embolism: Secondary | ICD-10-CM

## 2011-02-09 DIAGNOSIS — D696 Thrombocytopenia, unspecified: Secondary | ICD-10-CM

## 2011-02-09 DIAGNOSIS — R42 Dizziness and giddiness: Secondary | ICD-10-CM

## 2011-02-09 LAB — PROTIME-INR
INR: 2.1 (ref 2.00–3.50)
Protime: 25.2 Seconds — ABNORMAL HIGH (ref 10.6–13.4)

## 2011-02-11 ENCOUNTER — Ambulatory Visit (INDEPENDENT_AMBULATORY_CARE_PROVIDER_SITE_OTHER): Payer: MEDICARE | Admitting: Cardiovascular Disease

## 2011-02-11 DIAGNOSIS — I2699 Other pulmonary embolism without acute cor pulmonale: Secondary | ICD-10-CM

## 2011-02-11 DIAGNOSIS — I119 Hypertensive heart disease without heart failure: Secondary | ICD-10-CM

## 2011-03-02 ENCOUNTER — Telehealth: Payer: Self-pay | Admitting: Cardiovascular Disease

## 2011-03-02 NOTE — Telephone Encounter (Signed)
MEDICATIONS AND MEASUREMENT OF POTASSIUM LEVEL THAT ARE BEING SENT. PLACED CHART IN BOX.

## 2011-03-02 NOTE — Telephone Encounter (Signed)
Pt called to give bp results; 129/68/ 137/76 102/56 118/73  136/84  153/91 this was this am reading after 2 cups of coffee and med had been taken . Pt told to continue with HCTZ at current dose of 12.5mg . Pt complained of mid sternum heart burn type sensation after eating usually after dinner, pt feels it is indigestion and has not tried any otc meds, has had sensation off and on for weeks. Pt verbalized understanding to call back if no relief from malanta, tums or prilosec, which ever he tries or go to ER. Pt was content to try otc med and would call with any further questions or concerns. Dr Elease Hashimoto informed, no orders followed. Jodette Tamryn Popko rn

## 2011-03-09 ENCOUNTER — Other Ambulatory Visit: Payer: Self-pay | Admitting: Oncology

## 2011-03-09 ENCOUNTER — Encounter (HOSPITAL_BASED_OUTPATIENT_CLINIC_OR_DEPARTMENT_OTHER): Payer: MEDICARE | Admitting: Oncology

## 2011-03-09 DIAGNOSIS — Z86718 Personal history of other venous thrombosis and embolism: Secondary | ICD-10-CM

## 2011-03-09 DIAGNOSIS — Z7901 Long term (current) use of anticoagulants: Secondary | ICD-10-CM

## 2011-03-09 DIAGNOSIS — D696 Thrombocytopenia, unspecified: Secondary | ICD-10-CM

## 2011-03-09 DIAGNOSIS — R42 Dizziness and giddiness: Secondary | ICD-10-CM

## 2011-03-09 LAB — CBC WITH DIFFERENTIAL/PLATELET
BASO%: 0.5 % (ref 0.0–2.0)
Basophils Absolute: 0 10*3/uL (ref 0.0–0.1)
EOS%: 3.2 % (ref 0.0–7.0)
Eosinophils Absolute: 0.2 10*3/uL (ref 0.0–0.5)
HCT: 42.2 % (ref 38.4–49.9)
HGB: 14.6 g/dL (ref 13.0–17.1)
LYMPH%: 32.6 % (ref 14.0–49.0)
MCH: 31.8 pg (ref 27.2–33.4)
MCHC: 34.6 g/dL (ref 32.0–36.0)
MCV: 91.8 fL (ref 79.3–98.0)
MONO#: 0.6 10*3/uL (ref 0.1–0.9)
MONO%: 9.1 % (ref 0.0–14.0)
NEUT#: 3.9 10*3/uL (ref 1.5–6.5)
NEUT%: 54.6 % (ref 39.0–75.0)
Platelets: 138 10*3/uL — ABNORMAL LOW (ref 140–400)
RBC: 4.6 10*6/uL (ref 4.20–5.82)
RDW: 11.9 % (ref 11.0–14.6)
WBC: 7.1 10*3/uL (ref 4.0–10.3)
lymph#: 2.3 10*3/uL (ref 0.9–3.3)

## 2011-03-09 LAB — BASIC METABOLIC PANEL
BUN: 17 mg/dL (ref 6–23)
CO2: 27 mEq/L (ref 19–32)
Calcium: 9.4 mg/dL (ref 8.4–10.5)
Chloride: 103 mEq/L (ref 96–112)
Creatinine, Ser: 1.3 mg/dL (ref 0.40–1.50)
Glucose, Bld: 80 mg/dL (ref 70–99)
Potassium: 4.3 mEq/L (ref 3.5–5.3)
Sodium: 140 mEq/L (ref 135–145)

## 2011-03-09 LAB — PROTIME-INR
INR: 2.8 (ref 2.00–3.50)
Protime: 33.6 Seconds — ABNORMAL HIGH (ref 10.6–13.4)

## 2011-04-06 ENCOUNTER — Other Ambulatory Visit: Payer: Self-pay | Admitting: Oncology

## 2011-04-06 ENCOUNTER — Encounter (HOSPITAL_BASED_OUTPATIENT_CLINIC_OR_DEPARTMENT_OTHER): Payer: MEDICARE | Admitting: Oncology

## 2011-04-06 DIAGNOSIS — D696 Thrombocytopenia, unspecified: Secondary | ICD-10-CM

## 2011-04-06 LAB — BASIC METABOLIC PANEL
BUN: 21 mg/dL (ref 6–23)
CO2: 23 mEq/L (ref 19–32)
Calcium: 9.1 mg/dL (ref 8.4–10.5)
Chloride: 103 mEq/L (ref 96–112)
Creatinine, Ser: 1.31 mg/dL (ref 0.40–1.50)
Glucose, Bld: 78 mg/dL (ref 70–99)
Potassium: 4.4 mEq/L (ref 3.5–5.3)
Sodium: 137 mEq/L (ref 135–145)

## 2011-04-06 LAB — PROTIME-INR
INR: 2.2 (ref 2.00–3.50)
Protime: 26.4 Seconds — ABNORMAL HIGH (ref 10.6–13.4)

## 2011-04-16 ENCOUNTER — Other Ambulatory Visit: Payer: Self-pay | Admitting: *Deleted

## 2011-04-16 ENCOUNTER — Telehealth: Payer: Self-pay | Admitting: Cardiovascular Disease

## 2011-04-16 DIAGNOSIS — E785 Hyperlipidemia, unspecified: Secondary | ICD-10-CM

## 2011-04-16 NOTE — Telephone Encounter (Signed)
Given to amy to call.Alfonso Ramus RN

## 2011-04-16 NOTE — Telephone Encounter (Signed)
Called to schedule a visit and wanted to ensure that the blood work orders are placed so that his insurance will pay for them. Also wanted to speak with Amy to ensure that our offices have been receiving the potassium blood work from Dr. Alcide Evener. Really wanted to speak with AMY about everything....Marland KitchenMarland KitchenPlease call back. I have pulled the chart.

## 2011-04-16 NOTE — Op Note (Signed)
NAME:  KEVON, TENCH            ACCOUNT NO.:  [id number]   MEDICAL RECORD NO.:  [id number]          PATIENT TYPE:  AMB   LOCATION:  DAY                          FACILITY:  [facility]   PHYSICIAN:  Ollen Gross. Vernell Morgans, M.D. DATE OF BIRTH:  [birth year]   DATE OF PROCEDURE:  01/05/2007  DATE OF DISCHARGE:                               OPERATIVE REPORT   PREOPERATIVE DIAGNOSIS:  Bilateral inguinal hernias.   POSTOPERATIVE DIAGNOSIS:  Bilateral hernias, left direct and right  indirect.   PROCEDURE:  Bilateral inguinal hernia repairs with mesh.   SURGEON:  Dr. Carolynne Edouard   ANESTHESIA:  General endotracheal.   PROCEDURE:  After informed consent was obtained, the patient was brought  to the operating room, placed in the supine position on the operating  room table.  After adequate induction of general anesthesia, the  patient's abdomen and both groins were prepped with Betadine and draped  in the usual sterile manner.  Attention was first turned to the right  groin.  This right groin area was infiltrated with 0.25% Marcaine.  A  small incision was made from the edge of the pubic tubercle towards the  anterior cephalic spine.  This incision was carried down through the  skin and subcutaneous tissue sharply with the electrocautery until the  fascia of the external oblique was encountered.  Gelpi retractors were  deployed.  The external oblique was opened along its fibers towards the  apex of the external ring with Metzenbaum scissors and 15 blade knife.  Blunt dissection was then carried out of the cord structures until they  could be surrounded between 2 fingers.  A 1/2-inch Penrose drain was  placed around the cord structures for retraction purposes.  The  ilioinguinal nerve was identified and was involved with some scar  tissue.  It was therefore clamped proximally and distally, divided and  ligated with 3-0 silk ties.  The cord was then gently skeletonized by  blunt hemostat dissection and some  sharp dissection with the  electrocautery.  A hernia sac was identified.  It was gently separated  from the rest of the cord structures; a hernia sac was opened.  There  were no visceral contents within the sac.  The sac was then ligated near  its base with 2-0 silk suture ligature.  The distal sac was excised and  sent to pathology, and the stump of the sac was allowed to retract back  below the floor of the canal.  The floor of the canal was then repaired  with a running 2-0 Prolene stitch.  The tails of the Prolene were left  long near the cord.  A 6 x 6 piece of Ultra-Pro mesh was cut in half,  and one half of this mesh was then cut to fit.  The mesh was sewed  inferiorly to the shelving edge of the inguinal ligament with a running  2-0 Prolene stitch.  The tails of the previous Prolene were brought  through the mesh and tied down.  Tails were cut in the mesh laterally,  and the tails were wrapped around the cord structures superiorly.  The  mesh was sewed to the muscle for aponeurotic strength layer of the  transversalis with interrupted 2-0 Prolene vertical mattress stitches.  The tails of the mesh were then anchored lateral to the cord to the  shelving edge of the inguinal ligament with interrupted 2-0 Prolene  stitch.  Once this was accomplished, the mesh was in good position  without any tension.  The wound was irrigated with copious amounts of  saline.  The external oblique was then reapproximated with running 2-0  Vicryl stitch, and the wound was infiltrated with more 0.25% Marcaine.  The subcutaneous fascia was closed with a running 3-0 Vicryl stitch, and  the skin was closed with a running 4-0 Monocryl subcuticular stitch.  This area was then covered with a lap pad.  Attention was then turned to  the left groin.  The left groin was infiltrated with 0.25% Marcaine.  A  small incision was made from the edge of the pubic tubercle on the left  towards the anterior cephalic spine.   This incision was carried down  through the skin and subcutaneous tissue sharply with the electrocautery  until the fascia of the external oblique was encountered.  A couple of  small bridging veins were clamped with hemostats, divided, and ligated  with 3-0 silk ties.  Gelpi retractors were deployed.  The external  oblique was opened along its fibers to the apex of the external ring  using a 15 blade knife and Metzenbaum scissors.  Blunt dissection was  then carried out of the cord structures until they could be surrounded  between 2 fingers.  A 1/2-inch Penrose drain was placed around the cord  structures for retraction purposes.  The cord itself was skeletonized,  but no hernia sac was identified with the cord.  Medial to the cord,  there was a definite bulging defect in the floor of the inguinal canal.  This was able to be easily reduced, and the floor of the canal was then  repaired with a running 2-0 Prolene stitch.  The tails of the Prolene  were left long.  The other half of the mesh was chosen and cut to fit.  The mesh was sewed inferiorly to the shelving edge of the inguinal  ligament with a running 2-0 Prolene stitch.  Tails were cut in the mesh  laterally.  The tails were first Prolene that repaired the floor were  brought through the mesh and tied down.  Prior to doing this, the  ilioinguinal nerve was also identified and was also involved with scar  and was therefore clamped proximally and distally with a hemostat,  divided and ligated with 3-0 silk ties.  The mesh was sewed superiorly  to the muscular aponeurotic strength layer of the transversalis with  interrupted 2-0 Prolene vertical mattress stitches.  The tails of mesh  were anchored lateral to the cord to the shelving edge of inguinal  ligament with an interrupted 2-0 Prolene stitch.  Once this was  accomplished, the mesh was in good position.  The hernia was nicely repaired.  The wound was irrigated with copious  amounts of saline.  The  external oblique was then reapproximated with a running 2-0 Vicryl  stitch.  The wound was infiltrated with more 0.25% Marcaine.  The  subcutaneous fascia was closed with a running 3-0 Vicryl stitch, and the  skin was closed with a running 4-0 Monocryl subcuticular stitch.  Benzoin and Steri-Strips and sterile dressings were applied, and the  patient's testicles were down in the scrotum at the end of the case.  The patient tolerated the procedure well.  At the end of the case, all  needle, sponge, instrument counts were correct.  The patient was then  awakened and taken to recovery in stable condition.      Ollen Gross. Vernell Morgans, M.D.  Electronically Signed     PST/MEDQ  D:  01/05/2007  T:  01/05/2007  Job:  [id number]

## 2011-05-11 ENCOUNTER — Encounter (HOSPITAL_BASED_OUTPATIENT_CLINIC_OR_DEPARTMENT_OTHER): Payer: MEDICARE | Admitting: Oncology

## 2011-05-11 ENCOUNTER — Other Ambulatory Visit: Payer: Self-pay | Admitting: Oncology

## 2011-05-11 DIAGNOSIS — D696 Thrombocytopenia, unspecified: Secondary | ICD-10-CM

## 2011-05-11 DIAGNOSIS — Z86718 Personal history of other venous thrombosis and embolism: Secondary | ICD-10-CM

## 2011-05-11 DIAGNOSIS — Z7901 Long term (current) use of anticoagulants: Secondary | ICD-10-CM

## 2011-05-11 DIAGNOSIS — R42 Dizziness and giddiness: Secondary | ICD-10-CM

## 2011-05-11 LAB — PROTIME-INR
INR: 2.3 (ref 2.00–3.50)
Protime: 27.6 Seconds — ABNORMAL HIGH (ref 10.6–13.4)

## 2011-05-11 LAB — BASIC METABOLIC PANEL
BUN: 14 mg/dL (ref 6–23)
CO2: 24 mEq/L (ref 19–32)
Calcium: 8.8 mg/dL (ref 8.4–10.5)
Chloride: 105 mEq/L (ref 96–112)
Creatinine, Ser: 1.16 mg/dL (ref 0.50–1.35)
Glucose, Bld: 117 mg/dL — ABNORMAL HIGH (ref 70–99)
Potassium: 3.8 mEq/L (ref 3.5–5.3)
Sodium: 138 mEq/L (ref 135–145)

## 2011-05-20 ENCOUNTER — Encounter: Payer: Self-pay | Admitting: Cardiovascular Disease

## 2011-05-27 ENCOUNTER — Other Ambulatory Visit (INDEPENDENT_AMBULATORY_CARE_PROVIDER_SITE_OTHER): Payer: MEDICARE | Admitting: *Deleted

## 2011-05-27 ENCOUNTER — Other Ambulatory Visit: Payer: Self-pay | Admitting: *Deleted

## 2011-05-27 ENCOUNTER — Encounter: Payer: Self-pay | Admitting: Cardiovascular Disease

## 2011-05-27 ENCOUNTER — Ambulatory Visit (INDEPENDENT_AMBULATORY_CARE_PROVIDER_SITE_OTHER): Payer: MEDICARE | Admitting: Cardiovascular Disease

## 2011-05-27 VITALS — BP 128/92 | HR 70 | Ht 71.0 in | Wt 208.4 lb

## 2011-05-27 DIAGNOSIS — E785 Hyperlipidemia, unspecified: Secondary | ICD-10-CM | POA: Insufficient documentation

## 2011-05-27 DIAGNOSIS — I2510 Atherosclerotic heart disease of native coronary artery without angina pectoris: Secondary | ICD-10-CM | POA: Insufficient documentation

## 2011-05-27 DIAGNOSIS — I10 Essential (primary) hypertension: Secondary | ICD-10-CM | POA: Insufficient documentation

## 2011-05-27 LAB — BASIC METABOLIC PANEL
BUN: 18 mg/dL (ref 6–23)
CO2: 29 mEq/L (ref 19–32)
Calcium: 8.9 mg/dL (ref 8.4–10.5)
Chloride: 103 mEq/L (ref 96–112)
Creatinine, Ser: 1.2 mg/dL (ref 0.4–1.5)
GFR: 67.7 mL/min (ref >60.00)
Glucose, Bld: 88 mg/dL (ref 70–99)
Potassium: 3.9 mEq/L (ref 3.5–5.1)
Sodium: 138 mEq/L (ref 135–145)

## 2011-05-27 LAB — HEPATIC FUNCTION PANEL
ALT: 20 U/L (ref 0–53)
AST: 19 U/L (ref 0–37)
Albumin: 4.2 g/dL (ref 3.5–5.2)
Alkaline Phosphatase: 32 U/L — ABNORMAL LOW (ref 39–117)
Bilirubin, Direct: 0.2 mg/dL (ref 0.0–0.3)
Total Bilirubin: 0.8 mg/dL (ref 0.3–1.2)
Total Protein: 6.9 g/dL (ref 6.0–8.3)

## 2011-05-27 LAB — LIPID PANEL
Cholesterol: 128 mg/dL (ref 0–200)
HDL: 50.9 mg/dL (ref >39.00)
LDL Cholesterol: 59 mg/dL (ref 0–99)
Total CHOL/HDL Ratio: 3
Triglycerides: 92 mg/dL (ref 0.0–149.0)
VLDL: 18.4 mg/dL (ref 0.0–40.0)

## 2011-05-27 MED ORDER — POTASSIUM CHLORIDE ER 10 MEQ PO TBCR: 10.0000 meq | EXTENDED_RELEASE_TABLET | Freq: Two times a day (BID) | ORAL | Status: DC

## 2011-05-27 MED ORDER — HYDROCHLOROTHIAZIDE 25 MG PO TABS: 25.0000 mg | ORAL_TABLET | Freq: Every day | ORAL | Status: DC

## 2011-05-27 NOTE — Patient Instructions (Signed)
Continue to watch your salt intake.

## 2011-05-27 NOTE — Progress Notes (Signed)
Chad Gross Date of Birth  [birth year] [facility] / [facility] [address]     [address] [place], [place]  [address] [phone]  Fax  [phone]  History of Present Illness:  Pt is doing well.  BP readings have been slightly elevated.  NO chest pain.  Current Outpatient Prescriptions on File Prior to Visit  Medication Sig Dispense Refill  . aspirin 81 MG tablet Take 81 mg by mouth daily.        Marland Kitchen levocetirizine (XYZAL) 5 MG tablet Take 5 mg by mouth every evening.        Marland Kitchen losartan (COZAAR) 100 MG tablet Take 100 mg by mouth daily.        . meloxicam (MOBIC) 7.5 MG tablet Take 7.5 mg by mouth daily.        . Multiple Vitamin (MULTIVITAMIN) tablet Take 1 tablet by mouth daily.        Marland Kitchen omega-3 acid ethyl esters (LOVAZA) 1 G capsule Take 2 g by mouth 2 (two) times daily.        . pravastatin (PRAVACHOL) 40 MG tablet Take 40 mg by mouth daily.        . vardenafil (LEVITRA) 20 MG tablet Take 20 mg by mouth daily as needed.        . warfarin (COUMADIN) 10 MG tablet Take 10 mg by mouth as directed.          No Known Allergies  Past Medical History  Diagnosis Date  . Hyperlipidemia   . Hypertension   . History of DVT (deep vein thrombosis)   . Pulmonary embolism   . ED (erectile dysfunction)   . Pericarditis     Past Surgical History  Procedure Date  . Cardiac catheterization 01/21/2011    EF 50-55%  . Inguinal hernia repair     BILATERAL  . [place] fliter placement     History  Smoking status  . Never Smoker   Smokeless tobacco  . Not on file    History  Alcohol Use No    Family History  Problem Relation Age of Onset  . Breast cancer Mother   . Hypertension Mother   . Heart failure Father   . Pneumonia Father   . Hypertension Father     Reviw of Systems:  Reviewed in the HPI.  All other systems are negative.  Physical Exam: BP 128/92  Pulse 70  Ht 5\' 11"  (1.803 m)  Wt 208 lb 6.4 oz (94.53 kg)  BMI 29.07  kg/m2 The patient is alert and oriented x 3.  The mood and affect are normal.   Skin: warm and dry.  Color is normal.    HEENT:   the sclera are nonicteric.  The mucous membranes are moist.  The carotids are 2+ without bruits.  There is no thyromegaly.  There is no JVD.    Lungs: clear.  The chest wall is non tender.    Heart: regular rate with a normal S1 and S2.  There are no murmurs, gallops, or rubs. The PMI is not displaced.     Abdomin: good bowel sounds.  There is no guarding or rebound.  There is no hepatosplenomegaly or tenderness.  There are no masses.   Extremities:  no clubbing, cyanosis, or edema.  The legs are without rashes.  The distal pulses are intact.   Neuro:  Cranial nerves II - XII are intact.  Motor and sensory functions are intact.  The gait is normal.  Assessment / Plan:

## 2011-05-27 NOTE — Telephone Encounter (Signed)
Fax received from pharmacy. Refill completed. Jodette Lada Fulbright RN  

## 2011-05-27 NOTE — Assessment & Plan Note (Signed)
The patient has an elevated coronary calcium score. His stress Myoview study was negative for ischemia. His ejection fraction was 47%. Followup heart catheterization revealed minor coronary artery irregularities. His ejection fraction was 50-55% by cardiac catheterization.  He continues to be asymptomatic. He's not had any symptoms. We'll continue with his same medications.

## 2011-05-27 NOTE — Assessment & Plan Note (Signed)
His blood pressure remains a little elevated. We'll increase his HCTZ to 25 mg a day. We will add potassium chloride 10 mEq a day. I'll see him again in 6 months. He'll begin labwork from Dr. Franklyn Lor

## 2011-05-28 ENCOUNTER — Other Ambulatory Visit: Payer: Self-pay | Admitting: *Deleted

## 2011-05-28 DIAGNOSIS — I10 Essential (primary) hypertension: Secondary | ICD-10-CM

## 2011-05-28 MED ORDER — POTASSIUM CHLORIDE ER 10 MEQ PO TBCR: 10.0000 meq | EXTENDED_RELEASE_TABLET | Freq: Two times a day (BID) | ORAL | Status: DC

## 2011-05-28 NOTE — Telephone Encounter (Signed)
Fax received from pharmacy. Refill completed. Jodette Deauna Yaw RN  

## 2011-06-01 NOTE — Progress Notes (Signed)
msg left of normal labs

## 2011-06-03 ENCOUNTER — Encounter (HOSPITAL_BASED_OUTPATIENT_CLINIC_OR_DEPARTMENT_OTHER): Payer: MEDICARE | Admitting: Oncology

## 2011-06-03 ENCOUNTER — Other Ambulatory Visit: Payer: Self-pay | Admitting: Oncology

## 2011-06-03 DIAGNOSIS — R42 Dizziness and giddiness: Secondary | ICD-10-CM

## 2011-06-03 DIAGNOSIS — Z7901 Long term (current) use of anticoagulants: Secondary | ICD-10-CM

## 2011-06-03 DIAGNOSIS — Z86718 Personal history of other venous thrombosis and embolism: Secondary | ICD-10-CM

## 2011-06-03 DIAGNOSIS — D696 Thrombocytopenia, unspecified: Secondary | ICD-10-CM

## 2011-06-03 LAB — PROTIME-INR
INR: 1.8 — ABNORMAL LOW (ref 2.00–3.50)
Protime: 21.6 Seconds — ABNORMAL HIGH (ref 10.6–13.4)

## 2011-06-03 LAB — BASIC METABOLIC PANEL
BUN: 20 mg/dL (ref 6–23)
CO2: 25 mEq/L (ref 19–32)
Calcium: 8.9 mg/dL (ref 8.4–10.5)
Chloride: 102 mEq/L (ref 96–112)
Creatinine, Ser: 1.39 mg/dL — ABNORMAL HIGH (ref 0.50–1.35)
Glucose, Bld: 88 mg/dL (ref 70–99)
Potassium: 4 mEq/L (ref 3.5–5.3)
Sodium: 134 mEq/L — ABNORMAL LOW (ref 135–145)

## 2011-07-13 ENCOUNTER — Encounter (HOSPITAL_BASED_OUTPATIENT_CLINIC_OR_DEPARTMENT_OTHER): Payer: MEDICARE | Admitting: Oncology

## 2011-07-13 ENCOUNTER — Other Ambulatory Visit: Payer: Self-pay | Admitting: Oncology

## 2011-07-13 DIAGNOSIS — R42 Dizziness and giddiness: Secondary | ICD-10-CM

## 2011-07-13 DIAGNOSIS — Z86718 Personal history of other venous thrombosis and embolism: Secondary | ICD-10-CM

## 2011-07-13 DIAGNOSIS — D696 Thrombocytopenia, unspecified: Secondary | ICD-10-CM

## 2011-07-13 DIAGNOSIS — Z7901 Long term (current) use of anticoagulants: Secondary | ICD-10-CM

## 2011-07-13 LAB — BASIC METABOLIC PANEL
BUN: 19 mg/dL (ref 6–23)
CO2: 26 mEq/L (ref 19–32)
Calcium: 9.3 mg/dL (ref 8.4–10.5)
Chloride: 103 mEq/L (ref 96–112)
Creatinine, Ser: 1.23 mg/dL (ref 0.50–1.35)
Glucose, Bld: 89 mg/dL (ref 70–99)
Potassium: 4.1 mEq/L (ref 3.5–5.3)
Sodium: 135 mEq/L (ref 135–145)

## 2011-07-13 LAB — PROTIME-INR
INR: 2 (ref 2.00–3.50)
Protime: 24 Seconds — ABNORMAL HIGH (ref 10.6–13.4)

## 2011-07-19 ENCOUNTER — Telehealth: Payer: Self-pay | Admitting: Cardiovascular Disease

## 2011-07-19 DIAGNOSIS — I10 Essential (primary) hypertension: Secondary | ICD-10-CM

## 2011-07-19 MED ORDER — POTASSIUM CHLORIDE ER 10 MEQ PO TBCR: 10.0000 meq | EXTENDED_RELEASE_TABLET | Freq: Two times a day (BID) | ORAL | Status: DC

## 2011-07-19 NOTE — Telephone Encounter (Signed)
Patient request refill. Done Jodette Maiana Hennigan RN  

## 2011-07-19 NOTE — Telephone Encounter (Signed)
Pt would like to change a prescription that has been put in to [facility] in [place] for Klor-con from a 30day to 90day supply, please call pt back to confirm this has been done, if pt doesn't pick up he states it's ok to leave message, pt is aware that if he doesn't hear anything by 5pm he should hear back tomorrow, chart in box

## 2011-08-16 ENCOUNTER — Telehealth: Payer: Self-pay | Admitting: Cardiovascular Disease

## 2011-08-16 ENCOUNTER — Encounter (HOSPITAL_BASED_OUTPATIENT_CLINIC_OR_DEPARTMENT_OTHER): Payer: MEDICARE | Admitting: Oncology

## 2011-08-16 ENCOUNTER — Other Ambulatory Visit: Payer: Self-pay | Admitting: Oncology

## 2011-08-16 DIAGNOSIS — R42 Dizziness and giddiness: Secondary | ICD-10-CM

## 2011-08-16 DIAGNOSIS — Z86718 Personal history of other venous thrombosis and embolism: Secondary | ICD-10-CM

## 2011-08-16 DIAGNOSIS — Z7901 Long term (current) use of anticoagulants: Secondary | ICD-10-CM

## 2011-08-16 DIAGNOSIS — D696 Thrombocytopenia, unspecified: Secondary | ICD-10-CM

## 2011-08-16 LAB — CBC WITH DIFFERENTIAL/PLATELET
BASO%: 0.5 % (ref 0.0–2.0)
Basophils Absolute: 0 10*3/uL (ref 0.0–0.1)
EOS%: 3 % (ref 0.0–7.0)
Eosinophils Absolute: 0.2 10*3/uL (ref 0.0–0.5)
HCT: 44.8 % (ref 38.4–49.9)
HGB: 15.5 g/dL (ref 13.0–17.1)
LYMPH%: 38.2 % (ref 14.0–49.0)
MCH: 32.3 pg (ref 27.2–33.4)
MCHC: 34.7 g/dL (ref 32.0–36.0)
MCV: 93.2 fL (ref 79.3–98.0)
MONO#: 0.5 10*3/uL (ref 0.1–0.9)
MONO%: 7.3 % (ref 0.0–14.0)
NEUT#: 3.4 10*3/uL (ref 1.5–6.5)
NEUT%: 51 % (ref 39.0–75.0)
Platelets: 127 10*3/uL — ABNORMAL LOW (ref 140–400)
RBC: 4.81 10*6/uL (ref 4.20–5.82)
RDW: 12.4 % (ref 11.0–14.6)
WBC: 6.6 10*3/uL (ref 4.0–10.3)
lymph#: 2.5 10*3/uL (ref 0.9–3.3)

## 2011-08-16 LAB — BASIC METABOLIC PANEL
BUN: 17 mg/dL (ref 6–23)
CO2: 27 mEq/L (ref 19–32)
Calcium: 9.4 mg/dL (ref 8.4–10.5)
Chloride: 101 mEq/L (ref 96–112)
Creatinine, Ser: 1.31 mg/dL (ref 0.50–1.35)
Glucose, Bld: 91 mg/dL (ref 70–99)
Potassium: 4.4 mEq/L (ref 3.5–5.3)
Sodium: 136 mEq/L (ref 135–145)

## 2011-08-16 LAB — PROTIME-INR
INR: 1.8 — ABNORMAL LOW (ref 2.00–3.50)
Protime: 21.6 Seconds — ABNORMAL HIGH (ref 10.6–13.4)

## 2011-08-16 NOTE — Telephone Encounter (Signed)
Called msg that K+ levels have been in normal range, Dr Franklyn Lor is ordering the labs and to call there to see if they are ordering for a reason / call with any questions or concerns.

## 2011-08-16 NOTE — Telephone Encounter (Signed)
Pt calling wanting to know if pt needs to continue to do potassium work up every month. Please return pt call to discuss further.

## 2011-09-22 ENCOUNTER — Encounter (HOSPITAL_BASED_OUTPATIENT_CLINIC_OR_DEPARTMENT_OTHER): Payer: MEDICARE | Admitting: Oncology

## 2011-09-22 ENCOUNTER — Other Ambulatory Visit: Payer: Self-pay | Admitting: Oncology

## 2011-09-22 DIAGNOSIS — Z86718 Personal history of other venous thrombosis and embolism: Secondary | ICD-10-CM

## 2011-09-22 DIAGNOSIS — Z7901 Long term (current) use of anticoagulants: Secondary | ICD-10-CM

## 2011-09-22 DIAGNOSIS — R42 Dizziness and giddiness: Secondary | ICD-10-CM

## 2011-09-22 DIAGNOSIS — D696 Thrombocytopenia, unspecified: Secondary | ICD-10-CM

## 2011-09-22 LAB — PROTIME-INR
INR: 2.1 (ref 2.00–3.50)
Protime: 25.2 Seconds — ABNORMAL HIGH (ref 10.6–13.4)

## 2011-10-18 ENCOUNTER — Other Ambulatory Visit: Payer: MEDICARE | Admitting: Lab

## 2011-10-26 ENCOUNTER — Other Ambulatory Visit (HOSPITAL_BASED_OUTPATIENT_CLINIC_OR_DEPARTMENT_OTHER): Payer: MEDICARE | Admitting: Lab

## 2011-10-26 ENCOUNTER — Other Ambulatory Visit: Payer: Self-pay | Admitting: Oncology

## 2011-10-26 DIAGNOSIS — D696 Thrombocytopenia, unspecified: Secondary | ICD-10-CM

## 2011-10-26 DIAGNOSIS — R42 Dizziness and giddiness: Secondary | ICD-10-CM

## 2011-10-26 DIAGNOSIS — Z5181 Encounter for therapeutic drug level monitoring: Secondary | ICD-10-CM

## 2011-10-26 DIAGNOSIS — Z7901 Long term (current) use of anticoagulants: Secondary | ICD-10-CM

## 2011-10-26 LAB — PROTIME-INR
INR: 1.6 — ABNORMAL LOW (ref 2.00–3.50)
Protime: 19.2 Seconds — ABNORMAL HIGH (ref 10.6–13.4)

## 2011-11-01 ENCOUNTER — Telehealth: Payer: Self-pay | Admitting: *Deleted

## 2011-11-01 NOTE — Telephone Encounter (Signed)
Message copied by Wandalee Ferdinand on Mon Nov 01, 2011  7:22 PM ------      Message from: Ladene Artist      Created: Fri Oct 29, 2011  7:22 AM       Please call patient, same coumadin. Check PT 1 month.  We will adjust dose next month if PT remains low

## 2011-11-01 NOTE — Telephone Encounter (Signed)
Made patient aware of MD orders. He admits he missed a dose before Thanksgiving.

## 2011-11-15 ENCOUNTER — Encounter: Payer: Self-pay | Admitting: Cardiovascular Disease

## 2011-11-17 ENCOUNTER — Other Ambulatory Visit (HOSPITAL_BASED_OUTPATIENT_CLINIC_OR_DEPARTMENT_OTHER): Payer: MEDICARE | Admitting: Lab

## 2011-11-17 ENCOUNTER — Other Ambulatory Visit: Payer: Self-pay | Admitting: Oncology

## 2011-11-17 DIAGNOSIS — Z86718 Personal history of other venous thrombosis and embolism: Secondary | ICD-10-CM

## 2011-11-17 DIAGNOSIS — Z7901 Long term (current) use of anticoagulants: Secondary | ICD-10-CM

## 2011-11-17 DIAGNOSIS — R42 Dizziness and giddiness: Secondary | ICD-10-CM

## 2011-11-17 DIAGNOSIS — D696 Thrombocytopenia, unspecified: Secondary | ICD-10-CM

## 2011-11-17 LAB — PROTIME-INR
INR: 2 (ref 2.00–3.50)
Protime: 24 Seconds — ABNORMAL HIGH (ref 10.6–13.4)

## 2011-11-18 ENCOUNTER — Other Ambulatory Visit: Payer: Self-pay | Admitting: *Deleted

## 2011-11-18 ENCOUNTER — Telehealth: Payer: Self-pay | Admitting: *Deleted

## 2011-11-18 DIAGNOSIS — I82409 Acute embolism and thrombosis of unspecified deep veins of unspecified lower extremity: Secondary | ICD-10-CM

## 2011-11-18 DIAGNOSIS — D696 Thrombocytopenia, unspecified: Secondary | ICD-10-CM

## 2011-11-18 NOTE — Telephone Encounter (Signed)
Left message on voicemail for pt to continue same dose of Coumadin. Will need to check PT INR in one month. Pt due for CBC in January as well. Orders to schedulers for appts.

## 2011-11-18 NOTE — Telephone Encounter (Signed)
Message copied by Caleb Popp on Thu Nov 18, 2011  2:03 PM ------      Message from: [name] B      Created: Wed Nov 17, 2011 10:13 PM       Please call patient, same coumadin, check PT in 1 month

## 2011-11-19 ENCOUNTER — Telehealth: Payer: Self-pay | Admitting: Oncology

## 2011-11-19 NOTE — Telephone Encounter (Signed)
S/w the pt and he is aware to pick up his jan 2013 appt calendar and for him to pick up the rest of his appt calendars at that time

## 2011-12-20 ENCOUNTER — Other Ambulatory Visit: Payer: MEDICARE | Admitting: Lab

## 2011-12-20 DIAGNOSIS — D696 Thrombocytopenia, unspecified: Secondary | ICD-10-CM

## 2011-12-20 DIAGNOSIS — I82409 Acute embolism and thrombosis of unspecified deep veins of unspecified lower extremity: Secondary | ICD-10-CM

## 2011-12-20 LAB — CBC WITH DIFFERENTIAL/PLATELET
BASO%: 0.3 % (ref 0.0–2.0)
Basophils Absolute: 0 10*3/uL (ref 0.0–0.1)
EOS%: 3.5 % (ref 0.0–7.0)
Eosinophils Absolute: 0.3 10*3/uL (ref 0.0–0.5)
HCT: 44.2 % (ref 38.4–49.9)
HGB: 15.5 g/dL (ref 13.0–17.1)
LYMPH%: 32.6 % (ref 14.0–49.0)
MCH: 31.3 pg (ref 27.2–33.4)
MCHC: 35.1 g/dL (ref 32.0–36.0)
MCV: 89.3 fL (ref 79.3–98.0)
MONO#: 0.6 10*3/uL (ref 0.1–0.9)
MONO%: 7 % (ref 0.0–14.0)
NEUT#: 5 10*3/uL (ref 1.5–6.5)
NEUT%: 56.6 % (ref 39.0–75.0)
Platelets: 132 10*3/uL — ABNORMAL LOW (ref 140–400)
RBC: 4.95 10*6/uL (ref 4.20–5.82)
RDW: 12.3 % (ref 11.0–14.6)
WBC: 8.8 10*3/uL (ref 4.0–10.3)
lymph#: 2.9 10*3/uL (ref 0.9–3.3)
nRBC: 0 % (ref 0–0)

## 2011-12-20 LAB — PROTIME-INR
INR: 1.9 — ABNORMAL LOW (ref 2.00–3.50)
Protime: 22.8 Seconds — ABNORMAL HIGH (ref 10.6–13.4)

## 2011-12-21 ENCOUNTER — Telehealth: Payer: Self-pay | Admitting: *Deleted

## 2011-12-21 NOTE — Telephone Encounter (Signed)
Message copied by Wandalee Ferdinand on Tue Dec 21, 2011  7:15 PM ------      Message from: [name] B      Created: Mon Dec 20, 2011  8:43 PM       Please call patient, same coumadin , check PT 1 month, platelets stable

## 2011-12-21 NOTE — Telephone Encounter (Signed)
Patient notified to continue Coumadin 10 mg daily--recheck in 1 month. CBC is stable.

## 2012-01-17 ENCOUNTER — Other Ambulatory Visit (HOSPITAL_BASED_OUTPATIENT_CLINIC_OR_DEPARTMENT_OTHER): Payer: MEDICARE | Admitting: Lab

## 2012-01-17 DIAGNOSIS — D696 Thrombocytopenia, unspecified: Secondary | ICD-10-CM

## 2012-01-17 DIAGNOSIS — Z86718 Personal history of other venous thrombosis and embolism: Secondary | ICD-10-CM

## 2012-01-17 DIAGNOSIS — Z7901 Long term (current) use of anticoagulants: Secondary | ICD-10-CM

## 2012-01-17 LAB — PROTIME-INR
INR: 2.4 (ref 2.00–3.50)
Protime: 28.8 Seconds — ABNORMAL HIGH (ref 10.6–13.4)

## 2012-01-17 LAB — CBC WITH DIFFERENTIAL/PLATELET
BASO%: 0.3 % (ref 0.0–2.0)
Basophils Absolute: 0 10*3/uL (ref 0.0–0.1)
EOS%: 3.5 % (ref 0.0–7.0)
Eosinophils Absolute: 0.3 10*3/uL (ref 0.0–0.5)
HCT: 39.5 % (ref 38.4–49.9)
HGB: 13.8 g/dL (ref 13.0–17.1)
LYMPH%: 32.9 % (ref 14.0–49.0)
MCH: 31.5 pg (ref 27.2–33.4)
MCHC: 34.9 g/dL (ref 32.0–36.0)
MCV: 90.2 fL (ref 79.3–98.0)
MONO#: 0.8 10*3/uL (ref 0.1–0.9)
MONO%: 8.3 % (ref 0.0–14.0)
NEUT#: 5.2 10*3/uL (ref 1.5–6.5)
NEUT%: 55 % (ref 39.0–75.0)
Platelets: 118 10*3/uL — ABNORMAL LOW (ref 140–400)
RBC: 4.38 10*6/uL (ref 4.20–5.82)
RDW: 12.7 % (ref 11.0–14.6)
WBC: 9.5 10*3/uL (ref 4.0–10.3)
lymph#: 3.1 10*3/uL (ref 0.9–3.3)
nRBC: 0 % (ref 0–0)

## 2012-01-18 ENCOUNTER — Telehealth: Payer: Self-pay | Admitting: *Deleted

## 2012-01-18 NOTE — Progress Notes (Signed)
No answer at home phone.

## 2012-01-18 NOTE — Telephone Encounter (Signed)
Results--No answer

## 2012-01-19 ENCOUNTER — Telehealth: Payer: Self-pay | Admitting: *Deleted

## 2012-01-19 DIAGNOSIS — I82409 Acute embolism and thrombosis of unspecified deep veins of unspecified lower extremity: Secondary | ICD-10-CM

## 2012-01-19 DIAGNOSIS — D696 Thrombocytopenia, unspecified: Secondary | ICD-10-CM

## 2012-01-19 NOTE — Telephone Encounter (Signed)
Message copied by Caleb Popp on Wed Jan 19, 2012  3:35 PM ------      Message from: [name] B      Created: Tue Jan 18, 2012  8:11 AM       Please call patient, same coumadin, stable mild thrombocytopenia, pt 32month, cbc 3 months with pt

## 2012-01-19 NOTE — Telephone Encounter (Signed)
Called pt, instructed him to continue same dose of Coumadin, check PT one month. Platelet count is stable, will check in 3mos.He verbalized understanding.

## 2012-02-14 ENCOUNTER — Other Ambulatory Visit (HOSPITAL_BASED_OUTPATIENT_CLINIC_OR_DEPARTMENT_OTHER): Payer: MEDICARE | Admitting: Lab

## 2012-02-14 DIAGNOSIS — I82409 Acute embolism and thrombosis of unspecified deep veins of unspecified lower extremity: Secondary | ICD-10-CM

## 2012-02-14 LAB — PROTIME-INR
INR: 2.4 (ref 2.00–3.50)
Protime: 28.8 Seconds — ABNORMAL HIGH (ref 10.6–13.4)

## 2012-02-17 ENCOUNTER — Telehealth: Payer: Self-pay | Admitting: *Deleted

## 2012-02-17 NOTE — Telephone Encounter (Signed)
Left message on voicemail for pt to continue same dose, follow up as scheduled.

## 2012-02-17 NOTE — Telephone Encounter (Signed)
Message copied by Caleb Popp on Thu Feb 17, 2012  1:05 PM ------      Message from: [name] B      Created: Wed Feb 16, 2012  5:51 PM       Please call patient, same coumadin, check pt IN 1 MONTH

## 2012-03-13 ENCOUNTER — Telehealth: Payer: Self-pay | Admitting: Oncology

## 2012-03-13 ENCOUNTER — Other Ambulatory Visit (HOSPITAL_BASED_OUTPATIENT_CLINIC_OR_DEPARTMENT_OTHER): Payer: MEDICARE | Admitting: Lab

## 2012-03-13 DIAGNOSIS — I82409 Acute embolism and thrombosis of unspecified deep veins of unspecified lower extremity: Secondary | ICD-10-CM

## 2012-03-13 LAB — PROTIME-INR
INR: 2.5 (ref 2.00–3.50)
Protime: 30 Seconds — ABNORMAL HIGH (ref 10.6–13.4)

## 2012-03-13 NOTE — Telephone Encounter (Signed)
pt came by and r/s appt on 05/14 to 05/17

## 2012-03-14 ENCOUNTER — Telehealth: Payer: Self-pay | Admitting: *Deleted

## 2012-03-14 NOTE — Telephone Encounter (Signed)
Message copied by Wandalee Ferdinand on Tue Mar 14, 2012  6:46 PM ------      Message from: Ladene Artist      Created: Tue Mar 14, 2012 12:57 AM       Please call patient, same coumadin, check pt in 1 month

## 2012-03-14 NOTE — Telephone Encounter (Signed)
Patient notified of results.

## 2012-04-11 ENCOUNTER — Other Ambulatory Visit: Payer: MEDICARE | Admitting: Lab

## 2012-04-14 ENCOUNTER — Other Ambulatory Visit (HOSPITAL_BASED_OUTPATIENT_CLINIC_OR_DEPARTMENT_OTHER): Payer: MEDICARE | Admitting: Lab

## 2012-04-14 DIAGNOSIS — D696 Thrombocytopenia, unspecified: Secondary | ICD-10-CM

## 2012-04-14 DIAGNOSIS — I82409 Acute embolism and thrombosis of unspecified deep veins of unspecified lower extremity: Secondary | ICD-10-CM

## 2012-04-14 LAB — CBC WITH DIFFERENTIAL/PLATELET
BASO%: 0.1 % (ref 0.0–2.0)
Basophils Absolute: 0 10*3/uL (ref 0.0–0.1)
EOS%: 2.3 % (ref 0.0–7.0)
Eosinophils Absolute: 0.3 10*3/uL (ref 0.0–0.5)
HCT: 42.9 % (ref 38.4–49.9)
HGB: 14.9 g/dL (ref 13.0–17.1)
LYMPH%: 20.6 % (ref 14.0–49.0)
MCH: 31 pg (ref 27.2–33.4)
MCHC: 34.7 g/dL (ref 32.0–36.0)
MCV: 89.4 fL (ref 79.3–98.0)
MONO#: 1 10*3/uL — ABNORMAL HIGH (ref 0.1–0.9)
MONO%: 7.9 % (ref 0.0–14.0)
NEUT#: 8.6 10*3/uL — ABNORMAL HIGH (ref 1.5–6.5)
NEUT%: 69.1 % (ref 39.0–75.0)
Platelets: 112 10*3/uL — ABNORMAL LOW (ref 140–400)
RBC: 4.8 10*6/uL (ref 4.20–5.82)
RDW: 12.4 % (ref 11.0–14.6)
WBC: 12.4 10*3/uL — ABNORMAL HIGH (ref 4.0–10.3)
lymph#: 2.5 10*3/uL (ref 0.9–3.3)
nRBC: 0 % (ref 0–0)

## 2012-04-14 LAB — PROTIME-INR
INR: 2.3 (ref 2.00–3.50)
Protime: 27.6 Seconds — ABNORMAL HIGH (ref 10.6–13.4)

## 2012-04-17 ENCOUNTER — Telehealth: Payer: Self-pay | Admitting: *Deleted

## 2012-04-17 DIAGNOSIS — I2699 Other pulmonary embolism without acute cor pulmonale: Secondary | ICD-10-CM

## 2012-04-17 NOTE — Telephone Encounter (Signed)
Notified patient of results. He requests K+ check with next CBC. OK per MD.

## 2012-04-17 NOTE — Telephone Encounter (Signed)
Message copied by Wandalee Ferdinand on Mon Apr 17, 2012  4:29 PM ------      Message from: Ladene Artist      Created: Sat Apr 15, 2012  6:23 PM       Please call patient, same coumadin      Check PT 1 month      Cbc 3 months

## 2012-05-09 ENCOUNTER — Other Ambulatory Visit (HOSPITAL_BASED_OUTPATIENT_CLINIC_OR_DEPARTMENT_OTHER): Payer: MEDICARE | Admitting: Lab

## 2012-05-09 DIAGNOSIS — I82409 Acute embolism and thrombosis of unspecified deep veins of unspecified lower extremity: Secondary | ICD-10-CM

## 2012-05-09 LAB — PROTIME-INR
INR: 2.5 (ref 2.00–3.50)
Protime: 30 Seconds — ABNORMAL HIGH (ref 10.6–13.4)

## 2012-05-10 ENCOUNTER — Telehealth: Payer: Self-pay | Admitting: *Deleted

## 2012-05-10 NOTE — Telephone Encounter (Signed)
Called pt, instructed him to continue same dose of Coumadin. Next appt confirmed. Pt voiced understanding.

## 2012-05-10 NOTE — Telephone Encounter (Signed)
Message copied by Caleb Popp on Wed May 10, 2012  3:30 PM ------      Message from: Ladene Artist      Created: Tue May 09, 2012 10:22 PM       Please call patient, same coumadin, check PT in 1 month

## 2012-06-06 ENCOUNTER — Other Ambulatory Visit (HOSPITAL_BASED_OUTPATIENT_CLINIC_OR_DEPARTMENT_OTHER): Payer: MEDICARE | Admitting: Lab

## 2012-06-06 DIAGNOSIS — I82409 Acute embolism and thrombosis of unspecified deep veins of unspecified lower extremity: Secondary | ICD-10-CM

## 2012-06-06 DIAGNOSIS — I824Z9 Acute embolism and thrombosis of unspecified deep veins of unspecified distal lower extremity: Secondary | ICD-10-CM

## 2012-06-06 LAB — PROTIME-INR
INR: 1.8 — ABNORMAL LOW (ref 2.00–3.50)
Protime: 21.6 Seconds — ABNORMAL HIGH (ref 10.6–13.4)

## 2012-06-07 ENCOUNTER — Telehealth: Payer: Self-pay | Admitting: *Deleted

## 2012-06-07 NOTE — Telephone Encounter (Signed)
Pt left note asking if it was OK to take a testosterone supplement from [facility]. Reviewed with Dr. Truett Perna: Have pt take supplement to his pharmacist to review for potential interaction with Coumadin. Be aware that hormones can potentially increase risk for blood clots. Pt voiced understanding. Stated he will have his pharmacist check active ingredients in supplement.  Instructed him to continue same dose of Coumadin for now.

## 2012-07-04 ENCOUNTER — Telehealth: Payer: Self-pay | Admitting: *Deleted

## 2012-07-04 ENCOUNTER — Other Ambulatory Visit (HOSPITAL_BASED_OUTPATIENT_CLINIC_OR_DEPARTMENT_OTHER): Payer: MEDICARE | Admitting: Lab

## 2012-07-04 DIAGNOSIS — I82409 Acute embolism and thrombosis of unspecified deep veins of unspecified lower extremity: Secondary | ICD-10-CM

## 2012-07-04 DIAGNOSIS — I824Z9 Acute embolism and thrombosis of unspecified deep veins of unspecified distal lower extremity: Secondary | ICD-10-CM

## 2012-07-04 DIAGNOSIS — I2699 Other pulmonary embolism without acute cor pulmonale: Secondary | ICD-10-CM

## 2012-07-04 LAB — CBC WITH DIFFERENTIAL/PLATELET
BASO%: 0.7 % (ref 0.0–2.0)
Basophils Absolute: 0.1 10*3/uL (ref 0.0–0.1)
EOS%: 4.3 % (ref 0.0–7.0)
Eosinophils Absolute: 0.3 10*3/uL (ref 0.0–0.5)
HCT: 41.8 % (ref 38.4–49.9)
HGB: 14.2 g/dL (ref 13.0–17.1)
LYMPH%: 36.2 % (ref 14.0–49.0)
MCH: 31.3 pg (ref 27.2–33.4)
MCHC: 33.9 g/dL (ref 32.0–36.0)
MCV: 92.1 fL (ref 79.3–98.0)
MONO#: 0.6 10*3/uL (ref 0.1–0.9)
MONO%: 7.7 % (ref 0.0–14.0)
NEUT#: 4.1 10*3/uL (ref 1.5–6.5)
NEUT%: 51.1 % (ref 39.0–75.0)
Platelets: 123 10*3/uL — ABNORMAL LOW (ref 140–400)
RBC: 4.54 10*6/uL (ref 4.20–5.82)
RDW: 13.4 % (ref 11.0–14.6)
WBC: 7.9 10*3/uL (ref 4.0–10.3)
lymph#: 2.9 10*3/uL (ref 0.9–3.3)

## 2012-07-04 LAB — BASIC METABOLIC PANEL
BUN: 11 mg/dL (ref 6–23)
CO2: 25 mEq/L (ref 19–32)
Calcium: 9 mg/dL (ref 8.4–10.5)
Chloride: 105 mEq/L (ref 96–112)
Creatinine, Ser: 1.05 mg/dL (ref 0.50–1.35)
Glucose, Bld: 79 mg/dL (ref 70–99)
Potassium: 4.3 mEq/L (ref 3.5–5.3)
Sodium: 138 mEq/L (ref 135–145)

## 2012-07-04 LAB — PROTIME-INR
INR: 1.2 — ABNORMAL LOW (ref 2.00–3.50)
Protime: 14.4 Seconds — ABNORMAL HIGH (ref 10.6–13.4)

## 2012-07-04 NOTE — Telephone Encounter (Signed)
Attempted to call patient to inquire if he had missed any coumadin doses are started any new meds or diet. No answer or machine at home # and he has requested in past not to call his cell #.

## 2012-07-06 ENCOUNTER — Telehealth: Payer: Self-pay | Admitting: *Deleted

## 2012-07-06 DIAGNOSIS — Z86718 Personal history of other venous thrombosis and embolism: Secondary | ICD-10-CM

## 2012-07-06 NOTE — Telephone Encounter (Signed)
Left message on home machine to call office regarding labs.

## 2012-07-06 NOTE — Telephone Encounter (Signed)
lmonvm for pt re appt for 8/20 @ 1:15pm and mailed schedule.

## 2012-07-06 NOTE — Telephone Encounter (Signed)
Message copied by Caleb Popp on Thu Jul 06, 2012 12:32 PM ------      Message from: Ladene Artist      Created: Tue Jul 04, 2012 10:56 PM       Please call patient, ? Taking coumadin, ? New meds.      If yes, repeat PT in 2 weeks      Platelets are stable

## 2012-07-06 NOTE — Telephone Encounter (Signed)
Pt left message on voicemail requesting appt to check PT INR. Orders entered for 2 week lab. Left message for pt to call office to let us know if he had missed any doses or had any medication or diet changes.

## 2012-07-07 ENCOUNTER — Telehealth: Payer: Self-pay | Admitting: *Deleted

## 2012-07-07 NOTE — Telephone Encounter (Signed)
Call from pt reporting the only medication change was an OTC testosterone he got from [facility]. Pt reports he had pharmacist review med and was told it should not interfere with Coumadin. Request sent to schedulers for 2 week lab appt to recheck INR.

## 2012-07-11 ENCOUNTER — Other Ambulatory Visit: Payer: MEDICARE | Admitting: Lab

## 2012-07-18 ENCOUNTER — Telehealth: Payer: Self-pay | Admitting: *Deleted

## 2012-07-18 ENCOUNTER — Other Ambulatory Visit (HOSPITAL_BASED_OUTPATIENT_CLINIC_OR_DEPARTMENT_OTHER): Payer: MEDICARE | Admitting: Lab

## 2012-07-18 DIAGNOSIS — Z86718 Personal history of other venous thrombosis and embolism: Secondary | ICD-10-CM

## 2012-07-18 DIAGNOSIS — I824Z9 Acute embolism and thrombosis of unspecified deep veins of unspecified distal lower extremity: Secondary | ICD-10-CM

## 2012-07-18 DIAGNOSIS — I82409 Acute embolism and thrombosis of unspecified deep veins of unspecified lower extremity: Secondary | ICD-10-CM

## 2012-07-18 LAB — PROTIME-INR
INR: 1.6 — ABNORMAL LOW (ref 2.00–3.50)
Protime: 19.2 Seconds — ABNORMAL HIGH (ref 10.6–13.4)

## 2012-07-19 ENCOUNTER — Other Ambulatory Visit: Payer: Self-pay | Admitting: *Deleted

## 2012-07-19 MED ORDER — WARFARIN SODIUM 2 MG PO TABS: 2.0000 mg | ORAL_TABLET | ORAL | Status: DC

## 2012-07-19 NOTE — Telephone Encounter (Signed)
Called pt, instructed him to increase Coumadin dose to 12 mg on MWF. 10 mg all other days, per Dr. Truett Perna. Pt voiced understanding and read instructions back to RN. Pt is scheduled for lab/MD 08/01/12. Pt reports he held his TEST-X180 supplement to see if it affected his INR. Asking if OK to resume taking this, he would like to continue. Yes, will adjust Coumadin as necessary.

## 2012-07-28 ENCOUNTER — Other Ambulatory Visit: Payer: Self-pay | Admitting: *Deleted

## 2012-07-28 DIAGNOSIS — Z7901 Long term (current) use of anticoagulants: Secondary | ICD-10-CM

## 2012-08-01 ENCOUNTER — Ambulatory Visit (HOSPITAL_BASED_OUTPATIENT_CLINIC_OR_DEPARTMENT_OTHER): Payer: MEDICARE | Admitting: Oncology

## 2012-08-01 ENCOUNTER — Telehealth: Payer: Self-pay | Admitting: Oncology

## 2012-08-01 ENCOUNTER — Other Ambulatory Visit: Payer: Self-pay

## 2012-08-01 ENCOUNTER — Other Ambulatory Visit (HOSPITAL_BASED_OUTPATIENT_CLINIC_OR_DEPARTMENT_OTHER): Payer: MEDICARE | Admitting: Lab

## 2012-08-01 VITALS — BP 134/81 | HR 63 | Temp 96.9°F | Resp 18 | Ht 71.0 in | Wt 192.2 lb

## 2012-08-01 DIAGNOSIS — I82409 Acute embolism and thrombosis of unspecified deep veins of unspecified lower extremity: Secondary | ICD-10-CM

## 2012-08-01 DIAGNOSIS — I2699 Other pulmonary embolism without acute cor pulmonale: Secondary | ICD-10-CM

## 2012-08-01 DIAGNOSIS — Z5181 Encounter for therapeutic drug level monitoring: Secondary | ICD-10-CM

## 2012-08-01 DIAGNOSIS — D696 Thrombocytopenia, unspecified: Secondary | ICD-10-CM

## 2012-08-01 DIAGNOSIS — N529 Male erectile dysfunction, unspecified: Secondary | ICD-10-CM

## 2012-08-01 DIAGNOSIS — Z7901 Long term (current) use of anticoagulants: Secondary | ICD-10-CM

## 2012-08-01 DIAGNOSIS — O2230 Deep phlebothrombosis in pregnancy, unspecified trimester: Secondary | ICD-10-CM

## 2012-08-01 DIAGNOSIS — I749 Embolism and thrombosis of unspecified artery: Secondary | ICD-10-CM

## 2012-08-01 LAB — CBC WITH DIFFERENTIAL/PLATELET
BASO%: 0.3 % (ref 0.0–2.0)
Basophils Absolute: 0 10*3/uL (ref 0.0–0.1)
EOS%: 3.5 % (ref 0.0–7.0)
Eosinophils Absolute: 0.3 10*3/uL (ref 0.0–0.5)
HCT: 41.1 % (ref 38.4–49.9)
HGB: 14.4 g/dL (ref 13.0–17.1)
LYMPH%: 40.8 % (ref 14.0–49.0)
MCH: 31.3 pg (ref 27.2–33.4)
MCHC: 35 g/dL (ref 32.0–36.0)
MCV: 89.3 fL (ref 79.3–98.0)
MONO#: 0.6 10*3/uL (ref 0.1–0.9)
MONO%: 7.6 % (ref 0.0–14.0)
NEUT#: 3.7 10*3/uL (ref 1.5–6.5)
NEUT%: 47.8 % (ref 39.0–75.0)
Platelets: 129 10*3/uL — ABNORMAL LOW (ref 140–400)
RBC: 4.6 10*6/uL (ref 4.20–5.82)
RDW: 13.2 % (ref 11.0–14.6)
WBC: 7.8 10*3/uL (ref 4.0–10.3)
lymph#: 3.2 10*3/uL (ref 0.9–3.3)
nRBC: 0 % (ref 0–0)

## 2012-08-01 LAB — PROTIME-INR
INR: 2.1 (ref 2.00–3.50)
Protime: 25.2 Seconds — ABNORMAL HIGH (ref 10.6–13.4)

## 2012-08-01 NOTE — Progress Notes (Signed)
   [facility]    OFFICE PROGRESS NOTE   INTERVAL HISTORY:   He returns as scheduled. No symptom of venous thrombosis. No bleeding. He is seeing Dr. Annabell Howells for treatment of erectile dysfunction. He is taking a "testosterone booster ". He plans to begin a trial of MUSE.  [name] reports being diagnosed with "pneumonia "several weeks ago. He had a fever and cough. The symptoms have resolved. He lost 20 pounds during this episode. He now has a good appetite.  Objective:  Vital signs in last 24 hours:  Blood pressure 134/81, pulse 63, temperature 96.9 F (36.1 C), temperature source Oral, resp. rate 18, height 5\' 11"  (1.803 m), weight 192 lb 3.2 oz (87.181 kg).    HEENT: Neck without mass Lymphatics: No cervical, supraclavicular, or axillary nodes Resp: Lungs with end inspiratory rhonchi at the upper posterior chest bilaterally, no respiratory distress, good air movement bilaterally Cardio: Regular rate and rhythm GI: No hepatomegaly, nontender Vascular: Chronic stasis change at the low leg bilaterally with slight enlargement of the right compared to the left lower leg    Portacath/PICC-without erythema  Lab Results:  Lab Results  Component Value Date   WBC 7.8 08/01/2012   HGB 14.4 08/01/2012   HCT 41.1 08/01/2012   MCV 89.3 08/01/2012   PLT 129* 08/01/2012   ANC 3.7 PT/INR 2.1    Medications: I have reviewed the patient's current medications.  Assessment/Plan: 1. 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  We did not change the Coumadin dose today.  He will return for a monthly PT check.   2. Indwelling IVC catheter.  3. 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  4.     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  5.     erectile dysfunction-followed by Dr. Annabell Howells  Disposition:  He will continue indefinite warfarin anticoagulation.  He will return for a PT check in one month. [name] is scheduled for an office visit in 9 months.  He will continue followup with Dr. Annabell Howells for management of erectile dysfunction. I recommended he seek medical attention if there is a further weight loss.   [name], MD  08/01/2012  4:01 PM

## 2012-08-01 NOTE — Telephone Encounter (Signed)
Patient had refills on both Coumadin 10mg  and Coumadin 2mg ; [facility] to refill both meds today.

## 2012-08-01 NOTE — Patient Instructions (Signed)
TYRIEK HOFMAN Mar 31, 1946 [id number]  [facility] Discharge Instructions  Your exam findings, labs and results were discussed with your MD today.  Filed Vitals:   08/01/12 1446  BP: 134/81  Pulse: 63  Temp: 96.9 F (36.1 C)  Resp: 18   Current outpatient prescriptions:aspirin 81 MG tablet, Take 81 mg by mouth daily.  , Disp: , Rfl: ;  levocetirizine (XYZAL) 5 MG tablet, Take 5 mg by mouth every evening.  , Disp: , Rfl: ;  losartan (COZAAR) 100 MG tablet, Take 100 mg by mouth daily.  , Disp: , Rfl: ;  meloxicam (MOBIC) 7.5 MG tablet, Take 7.5 mg by mouth daily.  , Disp: , Rfl: ;  metaxalone (SKELAXIN) 800 MG tablet, Take 800 mg by mouth as needed.  , Disp: , Rfl:  Multiple Vitamin (MULTIVITAMIN) tablet, Take 1 tablet by mouth daily.  , Disp: , Rfl: ;  Multiple Vitamin (MULTIVITAMIN) tablet, Take 1 tablet by mouth daily. Calcium, Magnesium, & Zinc, Disp: , Rfl: ;  omega-3 acid ethyl esters (LOVAZA) 1 G capsule, Take 2 g by mouth 2 (two) times daily.  , Disp: , Rfl: ;  OVER THE COUNTER MEDICATION, 2 (two) times daily. TEST-X180, Disp: , Rfl:  pravastatin (PRAVACHOL) 40 MG tablet, Take 40 mg by mouth daily.  , Disp: , Rfl: ;  vardenafil (LEVITRA) 20 MG tablet, Take 20 mg by mouth daily as needed.  , Disp: , Rfl: ;  warfarin (COUMADIN) 10 MG tablet, Take 10 mg by mouth as directed.  , Disp: , Rfl: ;  warfarin (COUMADIN) 2 MG tablet, Take 1 tablet (2 mg total) by mouth as directed., Disp: 15 tablet, Rfl: 3 hydrochlorothiazide 25 MG tablet, Take 1 tablet (25 mg total) by mouth daily., Disp: 90 tablet, Rfl: 3;  potassium chloride (K-DUR) 10 MEQ tablet, Take 1 tablet (10 mEq total) by mouth 2 (two) times daily., Disp: 180 tablet, Rfl: 3  Please visit scheduling to obtain calendar for future appointments.  Please call the [facility] at [phone] during business hours should you have any further questions or need assistance in obtaining follow-up care. If you have a  medical emergency, please dial 911.  Special Instructions:

## 2012-08-01 NOTE — Telephone Encounter (Signed)
pts made and printed for pt aom °

## 2012-08-15 ENCOUNTER — Ambulatory Visit: Payer: MEDICARE | Admitting: Oncology

## 2012-08-15 ENCOUNTER — Other Ambulatory Visit: Payer: MEDICARE | Admitting: Lab

## 2012-08-22 ENCOUNTER — Other Ambulatory Visit: Payer: Self-pay | Admitting: Oncology

## 2012-08-22 DIAGNOSIS — I82409 Acute embolism and thrombosis of unspecified deep veins of unspecified lower extremity: Secondary | ICD-10-CM

## 2012-09-05 ENCOUNTER — Other Ambulatory Visit (HOSPITAL_BASED_OUTPATIENT_CLINIC_OR_DEPARTMENT_OTHER): Payer: MEDICARE

## 2012-09-05 DIAGNOSIS — I82409 Acute embolism and thrombosis of unspecified deep veins of unspecified lower extremity: Secondary | ICD-10-CM

## 2012-09-05 DIAGNOSIS — O2230 Deep phlebothrombosis in pregnancy, unspecified trimester: Secondary | ICD-10-CM

## 2012-09-05 LAB — PROTIME-INR
INR: 1.7 — ABNORMAL LOW (ref 2.00–3.50)
Protime: 20.4 Seconds — ABNORMAL HIGH (ref 10.6–13.4)

## 2012-09-08 ENCOUNTER — Telehealth: Payer: Self-pay | Admitting: *Deleted

## 2012-09-08 NOTE — Telephone Encounter (Signed)
Called pt, instructed him to continue same dose of Coumadin, per Dr. Truett Perna. He confirms he takes Coumadin 12 mg MWF  10 mg all other days. Next appointment confirmed.

## 2012-09-08 NOTE — Telephone Encounter (Signed)
Message copied by Caleb Popp on Fri Sep 08, 2012  3:12 PM ------      Message from: [name] B      Created: Wed Sep 06, 2012  8:49 PM       Please call patient, same coumadin, check PT 1 month

## 2012-09-20 ENCOUNTER — Other Ambulatory Visit: Payer: Self-pay | Admitting: *Deleted

## 2012-09-20 ENCOUNTER — Telehealth: Payer: Self-pay | Admitting: Oncology

## 2012-09-20 ENCOUNTER — Telehealth: Payer: Self-pay | Admitting: *Deleted

## 2012-09-20 DIAGNOSIS — I2510 Atherosclerotic heart disease of native coronary artery without angina pectoris: Secondary | ICD-10-CM

## 2012-09-20 NOTE — Telephone Encounter (Signed)
Lab scheduled for Friday , Amy  Horton , RN will call patient

## 2012-09-20 NOTE — Telephone Encounter (Signed)
Received call from pt stating "have a bruise on my left arm, saw my urologist today and he said I might need to call and let you know."  Called and spoke with pt; states about "saucer plate size on my left arm"  Denies any other bruising; Confirmed still taking Coumadin 12 mg MWF 10 mg all other days and Aspirin daily. Next lab appt 10/10/12.  Note to Dr. Truett Perna  1550 Called and spoke with pt; per Dr. Truett Perna labs appt made for 09/22/12 at 11am.  Pt verbalized understanding.

## 2012-09-22 ENCOUNTER — Other Ambulatory Visit (HOSPITAL_BASED_OUTPATIENT_CLINIC_OR_DEPARTMENT_OTHER): Payer: MEDICARE | Admitting: Lab

## 2012-09-22 DIAGNOSIS — I2510 Atherosclerotic heart disease of native coronary artery without angina pectoris: Secondary | ICD-10-CM

## 2012-09-22 LAB — CBC WITH DIFFERENTIAL/PLATELET
BASO%: 0.2 % (ref 0.0–2.0)
Basophils Absolute: 0 10*3/uL (ref 0.0–0.1)
EOS%: 2.6 % (ref 0.0–7.0)
Eosinophils Absolute: 0.2 10*3/uL (ref 0.0–0.5)
HCT: 44.9 % (ref 38.4–49.9)
HGB: 15.5 g/dL (ref 13.0–17.1)
LYMPH%: 33.3 % (ref 14.0–49.0)
MCH: 31.1 pg (ref 27.2–33.4)
MCHC: 34.5 g/dL (ref 32.0–36.0)
MCV: 90.2 fL (ref 79.3–98.0)
MONO#: 0.5 10*3/uL (ref 0.1–0.9)
MONO%: 6.4 % (ref 0.0–14.0)
NEUT#: 4.8 10*3/uL (ref 1.5–6.5)
NEUT%: 57.5 % (ref 39.0–75.0)
Platelets: 115 10*3/uL — ABNORMAL LOW (ref 140–400)
RBC: 4.98 10*6/uL (ref 4.20–5.82)
RDW: 12.5 % (ref 11.0–14.6)
WBC: 8.4 10*3/uL (ref 4.0–10.3)
lymph#: 2.8 10*3/uL (ref 0.9–3.3)
nRBC: 0 % (ref 0–0)

## 2012-09-22 LAB — PROTIME-INR
INR: 2.1 (ref 2.00–3.50)
Protime: 25.2 Seconds — ABNORMAL HIGH (ref 10.6–13.4)

## 2012-09-30 ENCOUNTER — Other Ambulatory Visit: Payer: Self-pay | Admitting: Oncology

## 2012-09-30 DIAGNOSIS — I82409 Acute embolism and thrombosis of unspecified deep veins of unspecified lower extremity: Secondary | ICD-10-CM

## 2012-09-30 DIAGNOSIS — D696 Thrombocytopenia, unspecified: Secondary | ICD-10-CM

## 2012-10-09 ENCOUNTER — Telehealth: Payer: Self-pay | Admitting: Oncology

## 2012-10-09 NOTE — Telephone Encounter (Signed)
pt called in and moved his 11/12 appt to 11/13 for his lab

## 2012-10-10 ENCOUNTER — Other Ambulatory Visit: Payer: MEDICARE | Admitting: Lab

## 2012-10-11 ENCOUNTER — Other Ambulatory Visit (HOSPITAL_BASED_OUTPATIENT_CLINIC_OR_DEPARTMENT_OTHER): Payer: MEDICARE

## 2012-10-11 ENCOUNTER — Other Ambulatory Visit: Payer: Self-pay | Admitting: *Deleted

## 2012-10-11 DIAGNOSIS — I2510 Atherosclerotic heart disease of native coronary artery without angina pectoris: Secondary | ICD-10-CM

## 2012-10-11 LAB — PROTIME-INR
INR: 2 (ref 2.00–3.50)
Protime: 24 Seconds — ABNORMAL HIGH (ref 10.6–13.4)

## 2012-10-13 ENCOUNTER — Telehealth: Payer: Self-pay | Admitting: *Deleted

## 2012-10-13 NOTE — Telephone Encounter (Signed)
Message copied by Wandalee Ferdinand on Fri Oct 13, 2012 10:24 AM ------      Message from: [name] B      Created: Thu Oct 12, 2012  8:54 PM       Please call patient, same coumadin check PT 1 month

## 2012-10-13 NOTE — Telephone Encounter (Signed)
Spoke with patient regarding PT/INR results. Will remain on same dose Coumadin 10mg  daily and 12mg  MWF. Also made him aware of last month platelet count-stable.

## 2012-11-06 ENCOUNTER — Other Ambulatory Visit: Payer: Self-pay | Admitting: *Deleted

## 2012-11-06 DIAGNOSIS — I2510 Atherosclerotic heart disease of native coronary artery without angina pectoris: Secondary | ICD-10-CM

## 2012-11-07 ENCOUNTER — Other Ambulatory Visit (HOSPITAL_BASED_OUTPATIENT_CLINIC_OR_DEPARTMENT_OTHER): Payer: MEDICARE | Admitting: Lab

## 2012-11-07 DIAGNOSIS — I2510 Atherosclerotic heart disease of native coronary artery without angina pectoris: Secondary | ICD-10-CM

## 2012-11-07 LAB — PROTIME-INR
INR: 2.3 (ref 2.00–3.50)
Protime: 27.6 Seconds — ABNORMAL HIGH (ref 10.6–13.4)

## 2012-11-08 ENCOUNTER — Telehealth: Payer: Self-pay | Admitting: *Deleted

## 2012-11-08 NOTE — Telephone Encounter (Signed)
Calling to ask if OK for him to take CoQ-10 100 mg daily in regards to his Coumadin?

## 2012-11-08 NOTE — Telephone Encounter (Signed)
Message copied by Caleb Popp on Wed Nov 08, 2012  5:17 PM ------      Message from: Ladene Artist      Created: Tue Nov 07, 2012  8:55 PM       Please call patient, same coumadin, f/u PT 1 month

## 2012-11-08 NOTE — Telephone Encounter (Signed)
Left message on voicemail for pt to call office. Continue same dose of Coumadin, per Dr. Truett Perna.

## 2012-12-12 ENCOUNTER — Other Ambulatory Visit (HOSPITAL_BASED_OUTPATIENT_CLINIC_OR_DEPARTMENT_OTHER): Payer: MEDICARE | Admitting: Lab

## 2012-12-12 DIAGNOSIS — D696 Thrombocytopenia, unspecified: Secondary | ICD-10-CM

## 2012-12-12 DIAGNOSIS — O2230 Deep phlebothrombosis in pregnancy, unspecified trimester: Secondary | ICD-10-CM

## 2012-12-12 LAB — CBC & DIFF AND RETIC
BASO%: 0.2 % (ref 0.0–2.0)
Basophils Absolute: 0 10*3/uL (ref 0.0–0.1)
EOS%: 2 % (ref 0.0–7.0)
Eosinophils Absolute: 0.2 10*3/uL (ref 0.0–0.5)
HCT: 42.4 % (ref 38.4–49.9)
HGB: 14.8 g/dL (ref 13.0–17.1)
Immature Retic Fract: 2.3 % — ABNORMAL LOW (ref 3.00–10.60)
LYMPH%: 24.9 % (ref 14.0–49.0)
MCH: 31.6 pg (ref 27.2–33.4)
MCHC: 34.9 g/dL (ref 32.0–36.0)
MCV: 90.4 fL (ref 79.3–98.0)
MONO#: 0.7 10*3/uL (ref 0.1–0.9)
MONO%: 6.1 % (ref 0.0–14.0)
NEUT#: 7.5 10*3/uL — ABNORMAL HIGH (ref 1.5–6.5)
NEUT%: 66.8 % (ref 39.0–75.0)
Platelets: 121 10*3/uL — ABNORMAL LOW (ref 140–400)
RBC: 4.69 10*6/uL (ref 4.20–5.82)
RDW: 12.5 % (ref 11.0–14.6)
Retic %: 1.19 % (ref 0.80–1.80)
Retic Ct Abs: 55.81 10*3/uL (ref 34.80–93.90)
WBC: 11.2 10*3/uL — ABNORMAL HIGH (ref 4.0–10.3)
lymph#: 2.8 10*3/uL (ref 0.9–3.3)
nRBC: 0 % (ref 0–0)

## 2012-12-12 LAB — PROTIME-INR
INR: 2.2 (ref 2.00–3.50)
Protime: 26.4 Seconds — ABNORMAL HIGH (ref 10.6–13.4)

## 2012-12-13 ENCOUNTER — Telehealth: Payer: Self-pay | Admitting: *Deleted

## 2012-12-13 NOTE — Telephone Encounter (Signed)
Message copied by Caleb Popp on Wed Dec 13, 2012  8:41 AM ------      Message from: [name] B      Created: Tue Dec 12, 2012 10:07 PM       Please call patient, same coumadin, check PT 1 mo.

## 2012-12-13 NOTE — Telephone Encounter (Signed)
Called pt with lab results: Continue same dose of Coumadin- check PT in one month.  He is taking Coumadin 12 mg MWF 10 mg all other days. CBC results given as well. Pt voiced understanding.

## 2013-01-01 ENCOUNTER — Telehealth: Payer: Self-pay | Admitting: *Deleted

## 2013-01-01 NOTE — Telephone Encounter (Signed)
Message from pt reporting he had CT and CBC done by his urologist, Dr. Annabell Howells. Asking if Dr. Truett Perna needs to see him in regards to what was shown on CT. Reviewed with Dr. Truett Perna: Have results sent to office, per conversation with Dr. Annabell Howells, not concerning at this time.

## 2013-01-09 ENCOUNTER — Other Ambulatory Visit (HOSPITAL_BASED_OUTPATIENT_CLINIC_OR_DEPARTMENT_OTHER): Payer: MEDICARE | Admitting: Lab

## 2013-01-09 DIAGNOSIS — O2230 Deep phlebothrombosis in pregnancy, unspecified trimester: Secondary | ICD-10-CM

## 2013-01-09 DIAGNOSIS — I82409 Acute embolism and thrombosis of unspecified deep veins of unspecified lower extremity: Secondary | ICD-10-CM

## 2013-01-09 LAB — PROTIME-INR
INR: 2.3 (ref 2.00–3.50)
Protime: 27.6 Seconds — ABNORMAL HIGH (ref 10.6–13.4)

## 2013-01-10 ENCOUNTER — Telehealth: Payer: Self-pay | Admitting: *Deleted

## 2013-01-10 NOTE — Telephone Encounter (Signed)
Message copied by Wandalee Ferdinand on Wed Jan 10, 2013  3:13 PM ------      Message from: Ladene Artist      Created: Tue Jan 09, 2013 10:38 PM       Please call patient, same coumadin, f/u as scheduled ------

## 2013-01-10 NOTE — Telephone Encounter (Signed)
Continue coumadin 10mg  daily/12mg  MWF and recheck in 1 month. Patient notified.

## 2013-02-01 ENCOUNTER — Telehealth: Payer: Self-pay | Admitting: Cardiovascular Disease

## 2013-02-01 NOTE — Telephone Encounter (Signed)
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

## 2013-02-01 NOTE — Telephone Encounter (Signed)
Dr Truett Perna is going to advise on coumadin, pt thinks he needs a cardiac clearance for penile implant, but not sure, I called Dr Grier Rocher office @ [phone] and left msg to call back and leave a msg if he needs cardiac clearance. If so, pt needs an appointment.

## 2013-02-05 NOTE — Telephone Encounter (Signed)
Left another msg at dr office to call back if need cardiac clearance.

## 2013-02-06 ENCOUNTER — Other Ambulatory Visit: Payer: Self-pay | Admitting: *Deleted

## 2013-02-06 ENCOUNTER — Other Ambulatory Visit (HOSPITAL_BASED_OUTPATIENT_CLINIC_OR_DEPARTMENT_OTHER): Payer: MEDICARE

## 2013-02-06 DIAGNOSIS — I82409 Acute embolism and thrombosis of unspecified deep veins of unspecified lower extremity: Secondary | ICD-10-CM

## 2013-02-06 DIAGNOSIS — Z86718 Personal history of other venous thrombosis and embolism: Secondary | ICD-10-CM

## 2013-02-06 LAB — PROTIME-INR
INR: 2.3 (ref 2.00–3.50)
Protime: 27.6 Seconds — ABNORMAL HIGH (ref 10.6–13.4)

## 2013-02-08 ENCOUNTER — Telehealth: Payer: Self-pay | Admitting: *Deleted

## 2013-02-08 NOTE — Telephone Encounter (Signed)
Having penile implant for ED at [facility] on 03/19/13 per Dr. Hoy Morn. Needs to hold Coumadin for 5 days prior to procedure. Asking if he will need Lovenox bridge? Can contact the PA at [phone]. Requests he also be called back with plan.

## 2013-02-09 ENCOUNTER — Telehealth: Payer: Self-pay | Admitting: *Deleted

## 2013-02-09 NOTE — Telephone Encounter (Signed)
Per Dr. Truett Perna: Pt will not need Lovenox bridge for his procedure next month. Continue same dose of Coumadin.

## 2013-02-09 NOTE — Telephone Encounter (Signed)
Message copied by Caleb Popp on Fri Feb 09, 2013  3:45 PM ------      Message from: [name] B      Created: Wed Feb 07, 2013  9:58 PM       Please call patient, same coumdin, check PT in 1 mo. ------

## 2013-02-12 NOTE — Telephone Encounter (Signed)
MSG left with pt that we have not received or heard back from surgeon's office stating wether or not he needs cardiac clearance, told him to call back if needed and to set app, number provided.

## 2013-03-06 ENCOUNTER — Other Ambulatory Visit (HOSPITAL_BASED_OUTPATIENT_CLINIC_OR_DEPARTMENT_OTHER): Payer: MEDICARE | Admitting: Lab

## 2013-03-06 DIAGNOSIS — Z86718 Personal history of other venous thrombosis and embolism: Secondary | ICD-10-CM

## 2013-03-06 LAB — PROTIME-INR
INR: 2.4 (ref 2.00–3.50)
Protime: 28.8 Seconds — ABNORMAL HIGH (ref 10.6–13.4)

## 2013-03-07 ENCOUNTER — Telehealth: Payer: Self-pay | Admitting: *Deleted

## 2013-03-07 NOTE — Telephone Encounter (Signed)
Message copied by Caleb Popp on Wed Mar 07, 2013  1:26 PM ------      Message from: Ladene Artist      Created: Tue Mar 06, 2013  9:17 PM       Please call patient, same coumadin, check PT 1 month ------

## 2013-03-20 ENCOUNTER — Telehealth: Payer: Self-pay | Admitting: *Deleted

## 2013-03-20 NOTE — Telephone Encounter (Signed)
Received call from Chad Gross, with [facility]. He reviewed earlier Coumadin inquiry with physician, they recommend pt remain off of anticoagulation for 1 week post procedure. Pt is already scheduled for lab 5/13.

## 2013-03-20 NOTE — Telephone Encounter (Signed)
Call from pt asking when he should resume his Coumadin. Had his procedure today, after holding Coumadin 5 days. Per Dr. Truett Perna: Start Coumadin today. Check PT INR in 2 weeks. Called pt, he reports his urologist suggested he remain off Coumadin for another week. Left message for [facility] to clarify.

## 2013-04-10 ENCOUNTER — Other Ambulatory Visit (HOSPITAL_BASED_OUTPATIENT_CLINIC_OR_DEPARTMENT_OTHER): Payer: MEDICARE | Admitting: Lab

## 2013-04-10 DIAGNOSIS — Z86718 Personal history of other venous thrombosis and embolism: Secondary | ICD-10-CM

## 2013-04-10 LAB — PROTIME-INR
INR: 2.2 (ref 2.00–3.50)
Protime: 26.4 Seconds — ABNORMAL HIGH (ref 10.6–13.4)

## 2013-04-11 ENCOUNTER — Telehealth: Payer: Self-pay | Admitting: *Deleted

## 2013-04-11 DIAGNOSIS — Z86718 Personal history of other venous thrombosis and embolism: Secondary | ICD-10-CM

## 2013-04-11 NOTE — Telephone Encounter (Signed)
Message copied by Caleb Popp on Wed Apr 11, 2013  3:53 PM ------      Message from: Ladene Artist      Created: Tue Apr 10, 2013  9:48 PM       Please call patient, same coumadin, check PT 1 month ------

## 2013-04-11 NOTE — Telephone Encounter (Signed)
Left message on voicemail for pt to continue same dose of Coumadin, check PT with next visit. Requested he call office to confirm.

## 2013-04-12 ENCOUNTER — Encounter: Payer: Self-pay | Admitting: Cardiovascular Disease

## 2013-05-08 ENCOUNTER — Other Ambulatory Visit (HOSPITAL_BASED_OUTPATIENT_CLINIC_OR_DEPARTMENT_OTHER): Payer: MEDICARE | Admitting: Lab

## 2013-05-08 ENCOUNTER — Ambulatory Visit (HOSPITAL_BASED_OUTPATIENT_CLINIC_OR_DEPARTMENT_OTHER): Payer: MEDICARE | Admitting: Oncology

## 2013-05-08 ENCOUNTER — Other Ambulatory Visit: Payer: Self-pay | Admitting: *Deleted

## 2013-05-08 VITALS — BP 135/84 | HR 82 | Temp 98.5°F | Resp 19 | Ht 71.0 in | Wt 195.0 lb

## 2013-05-08 DIAGNOSIS — D696 Thrombocytopenia, unspecified: Secondary | ICD-10-CM

## 2013-05-08 DIAGNOSIS — I82409 Acute embolism and thrombosis of unspecified deep veins of unspecified lower extremity: Secondary | ICD-10-CM

## 2013-05-08 DIAGNOSIS — Z86718 Personal history of other venous thrombosis and embolism: Secondary | ICD-10-CM

## 2013-05-08 LAB — PROTIME-INR
INR: 3.2 (ref 2.00–3.50)
Protime: 38.4 Seconds — ABNORMAL HIGH (ref 10.6–13.4)

## 2013-05-08 MED ORDER — WARFARIN SODIUM 2 MG PO TABS: ORAL_TABLET | ORAL | Status: DC

## 2013-05-08 MED ORDER — WARFARIN SODIUM 10 MG PO TABS: ORAL_TABLET | ORAL | Status: DC

## 2013-05-08 NOTE — Progress Notes (Signed)
Pt given written prescriptions for Coumadin 10 mg and Coumadin 2 mg. Pt's insurance is changing in July. He will need to use mail order then.

## 2013-05-08 NOTE — Progress Notes (Signed)
   [facility]    OFFICE PROGRESS NOTE   INTERVAL HISTORY:   He returns as scheduled. He underwent placement of a penile implant in April of this year. He was off of Coumadin for approximately 2 weeks. The Coumadin was resumed and he reports no symptom of recurrent venous thrombosis. He complains of stiffness and discomfort in the left neck for the past several months. No arm weakness or numbness.  Objective:  Vital signs in last 24 hours:  Blood pressure 135/84, pulse 82, temperature 98.5 F (36.9 C), temperature source Oral, resp. rate 19, height 5\' 11"  (1.803 m), weight 195 lb (88.451 kg).    HEENT: Neck without mass. Tenderness over the left neck musculature. Lymphatics: No cervical, supraclavicular, axillary, or inguinal nodes Resp: Lungs clear bilaterally Cardio: Regular rate and rhythm GI: No hepatosplenomegaly Vascular: Chronic stasis change at the low leg bilaterally, slight enlargement of the right compared to the left lower leg Musculoskeletal: Mild discomfort with neck rotation to the left    Lab Results:  Lab Results  Component Value Date   WBC 11.2* 12/12/2012   HGB 14.8 12/12/2012   HCT 42.4 12/12/2012   MCV 90.4 12/12/2012   PLT 121* 12/12/2012   PT/INR-3.2   Medications: I have reviewed the patient's current medications.  Assessment/Plan: 1. 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 We did not change the Coumadin dose today. He will return for a monthly PT check.  2. Indwelling IVC catheter.  3. 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        4. 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        5. erectile dysfunction-followed by Dr. Annabell Howells , status post placement of a penile implant at [facility] in April 2014       6. Neck discomfort actually likely related to benign musculoskeletal condition-he will see Dr. Andrey Campanile   7. CT of the abdomen/pelvis at [facility] u[facility] February 2014 for evaluation of hematuria-12 mm external iliac nodes and left              periaortic retroperitoneal lymph node-11 mm  Disposition:  He appears stable from a hematologic standpoint. I suspect the neck pain is related to a benign musculoskeletal condition. He will see Dr. Andrey Campanile. He continues Coumadin anticoagulation. He will discuss the indication for continuing aspirin with Dr. Andrey Campanile.  He has chronic mild thrombocytopenia. We will check a platelet count when he returns for a lab visit in one month. 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  The small pelvic lymph nodes seen on the CT in February are most likely benign. He could have an early low-grade lymphoproliferative disorder, but has no symptoms to suggest this at present. We will consider a repeat CT if he develops symptoms suggestive of lymphoma or metastatic adenopathy.  Mr. Sudbury will return for an office visit in 9 months.   [name], MD  05/08/2013  3:56 PM

## 2013-05-08 NOTE — Telephone Encounter (Signed)
gv and printed appt sched and avs for pt  °

## 2013-06-12 ENCOUNTER — Other Ambulatory Visit (HOSPITAL_BASED_OUTPATIENT_CLINIC_OR_DEPARTMENT_OTHER): Payer: MEDICARE | Admitting: Lab

## 2013-06-12 ENCOUNTER — Telehealth: Payer: Self-pay | Admitting: *Deleted

## 2013-06-12 DIAGNOSIS — D696 Thrombocytopenia, unspecified: Secondary | ICD-10-CM

## 2013-06-12 DIAGNOSIS — I82409 Acute embolism and thrombosis of unspecified deep veins of unspecified lower extremity: Secondary | ICD-10-CM

## 2013-06-12 LAB — CBC & DIFF AND RETIC
BASO%: 0.3 % (ref 0.0–2.0)
Basophils Absolute: 0 10*3/uL (ref 0.0–0.1)
EOS%: 4.4 % (ref 0.0–7.0)
Eosinophils Absolute: 0.4 10*3/uL (ref 0.0–0.5)
HCT: 41.1 % (ref 38.4–49.9)
HGB: 14.1 g/dL (ref 13.0–17.1)
Immature Retic Fract: 1.6 % — ABNORMAL LOW (ref 3.00–10.60)
LYMPH%: 38.4 % (ref 14.0–49.0)
MCH: 30.9 pg (ref 27.2–33.4)
MCHC: 34.3 g/dL (ref 32.0–36.0)
MCV: 90.1 fL (ref 79.3–98.0)
MONO#: 0.5 10*3/uL (ref 0.1–0.9)
MONO%: 6 % (ref 0.0–14.0)
NEUT#: 4.4 10*3/uL (ref 1.5–6.5)
NEUT%: 50.9 % (ref 39.0–75.0)
Platelets: 139 10*3/uL — ABNORMAL LOW (ref 140–400)
RBC: 4.56 10*6/uL (ref 4.20–5.82)
RDW: 12.3 % (ref 11.0–14.6)
Retic %: 0.95 % (ref 0.80–1.80)
Retic Ct Abs: 43.32 10*3/uL (ref 34.80–93.90)
WBC: 8.7 10*3/uL (ref 4.0–10.3)
lymph#: 3.3 10*3/uL (ref 0.9–3.3)
nRBC: 0 % (ref 0–0)

## 2013-06-12 LAB — PROTIME-INR
INR: 2.1 (ref 2.00–3.50)
Protime: 25.2 Seconds — ABNORMAL HIGH (ref 10.6–13.4)

## 2013-06-12 NOTE — Telephone Encounter (Signed)
Patient called to follow up on CBC results-improved. Requests copy mailed to home (does not have MyChart). Cardiologist wants him on ASA 81 mg daily. Is also taking Meloxicam 7.5 mg daily for his arthritis per PCP, who told him to stop the ASA, but his cardiologist wants him to continue both as well as the warfarin. He is asking for Dr. Kalman Drape input on this. INR today 2.10-on Coumadin 10 mg daily & 12 mg MWF

## 2013-06-12 NOTE — Telephone Encounter (Signed)
Per Dr. Truett Perna : continue same coumadin and recheck in August as scheduled. There is potential bleeding risk with ASA/Coumadin and low platelets. No absolute contraindication. He will defer to cardiology. Sent this via mail with his lab results as requested.

## 2013-06-20 NOTE — Telephone Encounter (Signed)
Pt called again regarding taking ASA.  Reported that his cardiologist is not involved with this decision but his PCP wanted him to continue taking ASA.  Would like to re-clarify with Dr. Truett Perna regarding this question.  Per Dr. Truett Perna, informed pt that decision is up to his PCP.  Re-iterated there is a potential bleeding risk with ASA/Coumadin and low platelets but no absolute contraindication.  Pt verbalized understanding and stated he would follow what PCP instructed him to do.

## 2013-07-10 ENCOUNTER — Other Ambulatory Visit: Payer: MEDICARE

## 2013-07-12 ENCOUNTER — Telehealth: Payer: Self-pay | Admitting: *Deleted

## 2013-07-12 ENCOUNTER — Other Ambulatory Visit: Payer: Self-pay | Admitting: *Deleted

## 2013-07-12 ENCOUNTER — Other Ambulatory Visit (HOSPITAL_BASED_OUTPATIENT_CLINIC_OR_DEPARTMENT_OTHER): Payer: MEDICARE | Admitting: Lab

## 2013-07-12 DIAGNOSIS — I82409 Acute embolism and thrombosis of unspecified deep veins of unspecified lower extremity: Secondary | ICD-10-CM

## 2013-07-12 LAB — PROTIME-INR
INR: 2.5 (ref 2.00–3.50)
Protime: 30 Seconds — ABNORMAL HIGH (ref 10.6–13.4)

## 2013-07-12 NOTE — Telephone Encounter (Signed)
Continue 10 mg daily & 12 mg MWF. F/U as scheduled. Patient aware.

## 2013-08-07 ENCOUNTER — Other Ambulatory Visit: Payer: MEDICARE | Admitting: Lab

## 2013-08-07 ENCOUNTER — Telehealth: Payer: Self-pay | Admitting: Oncology

## 2013-08-07 NOTE — Telephone Encounter (Signed)
Pt called and returned call pt cancel labs today r/s to tomorrow 9/10

## 2013-08-08 ENCOUNTER — Other Ambulatory Visit (HOSPITAL_BASED_OUTPATIENT_CLINIC_OR_DEPARTMENT_OTHER): Payer: MEDICARE | Admitting: Lab

## 2013-08-08 DIAGNOSIS — I82409 Acute embolism and thrombosis of unspecified deep veins of unspecified lower extremity: Secondary | ICD-10-CM

## 2013-08-08 LAB — PROTIME-INR
INR: 2.6 (ref 2.00–3.50)
Protime: 31.2 Seconds — ABNORMAL HIGH (ref 10.6–13.4)

## 2013-08-10 ENCOUNTER — Telehealth: Payer: Self-pay | Admitting: *Deleted

## 2013-08-10 NOTE — Telephone Encounter (Signed)
Left VM to continue Coumadin 10mg  daily & 12 mg MWF. Return 1 month.

## 2013-08-10 NOTE — Telephone Encounter (Signed)
Message copied by Wandalee Ferdinand on Fri Aug 10, 2013 11:57 AM ------      Message from: [name] B      Created: Wed Aug 08, 2013  9:17 PM       Please call patient, same coumadin, check PT in 1 month ------

## 2013-08-27 ENCOUNTER — Telehealth: Payer: Self-pay | Admitting: *Deleted

## 2013-08-27 DIAGNOSIS — I82409 Acute embolism and thrombosis of unspecified deep veins of unspecified lower extremity: Secondary | ICD-10-CM | POA: Insufficient documentation

## 2013-08-27 DIAGNOSIS — I10 Essential (primary) hypertension: Secondary | ICD-10-CM | POA: Insufficient documentation

## 2013-08-28 NOTE — Telephone Encounter (Signed)
Late entry for 9/29 at 1720: Pt called to request we cancel all his upcoming appts. He has "temporarily relocated" to [place]. Pt will have release of info faxed to office with new provider's info to send medical records. Pt reports he is being referred to a Coumadin Clinic in [place].

## 2013-08-30 ENCOUNTER — Telehealth: Payer: Self-pay | Admitting: *Deleted

## 2013-08-30 NOTE — Telephone Encounter (Signed)
Release of information forwarded to medical records department to send info to [facility].

## 2013-08-31 ENCOUNTER — Telehealth: Payer: Self-pay | Admitting: Oncology

## 2013-08-31 NOTE — Telephone Encounter (Signed)
Faxed pt medical records to [facility]

## 2013-09-04 ENCOUNTER — Other Ambulatory Visit: Payer: MEDICARE

## 2013-09-05 ENCOUNTER — Telehealth: Payer: Self-pay | Admitting: *Deleted

## 2013-09-05 NOTE — Telephone Encounter (Signed)
Pt had sent an email through [place] system, but not MYCHART. His request was to be provided a phone/ fax number for Dr Corbin Ade to request records. I called and left a msg with out ov phone and fax number. Asked him not to send emails in this way. Pt needs ov.

## 2013-10-02 ENCOUNTER — Other Ambulatory Visit: Payer: MEDICARE

## 2013-10-30 ENCOUNTER — Other Ambulatory Visit: Payer: MEDICARE

## 2013-11-27 ENCOUNTER — Other Ambulatory Visit: Payer: MEDICARE

## 2013-12-05 DIAGNOSIS — M545 Low back pain, unspecified: Secondary | ICD-10-CM | POA: Insufficient documentation

## 2013-12-05 DIAGNOSIS — J309 Allergic rhinitis, unspecified: Secondary | ICD-10-CM | POA: Insufficient documentation

## 2013-12-25 ENCOUNTER — Other Ambulatory Visit: Payer: MEDICARE

## 2014-01-22 ENCOUNTER — Other Ambulatory Visit: Payer: MEDICARE | Admitting: Lab

## 2014-01-22 ENCOUNTER — Ambulatory Visit: Payer: MEDICARE | Admitting: Oncology

## 2015-05-06 ENCOUNTER — Ambulatory Visit: Payer: Medicare Other

## 2015-05-06 NOTE — Pre-Procedure Instructions (Signed)
Low risk procedure. No testing per anesthesia. No orders to note. Patient states scheduled with Dr. Pernell Dupre, not Dr. Cinda Quest. Called office [phone]; Fax: [phone] spoke with Shanda Bumps and left message for Sophia.

## 2015-05-28 ENCOUNTER — Encounter
Admission: RE | Disposition: A | Discharge status: Home / Self Care | Payer: Self-pay | Source: Ambulatory Visit | Attending: Gastroenterology

## 2015-05-28 ENCOUNTER — Ambulatory Visit: Payer: Medicare Other | Admitting: Physician Assistant

## 2015-05-28 ENCOUNTER — Ambulatory Visit
Admission: RE | Admit: 2015-05-28 | Discharge: 2015-05-28 | Disposition: A | Discharge status: Home / Self Care | Payer: Medicare Other | Source: Ambulatory Visit | Attending: Gastroenterology | Admitting: Gastroenterology

## 2015-05-28 ENCOUNTER — Ambulatory Visit: Payer: Medicare Other | Admitting: Gastroenterology

## 2015-05-28 DIAGNOSIS — Z1211 Encounter for screening for malignant neoplasm of colon: Secondary | ICD-10-CM | POA: Insufficient documentation

## 2015-05-28 DIAGNOSIS — K648 Other hemorrhoids: Secondary | ICD-10-CM | POA: Insufficient documentation

## 2015-05-28 DIAGNOSIS — K5730 Diverticulosis of large intestine without perforation or abscess without bleeding: Secondary | ICD-10-CM | POA: Insufficient documentation

## 2015-05-28 DIAGNOSIS — E785 Hyperlipidemia, unspecified: Secondary | ICD-10-CM | POA: Insufficient documentation

## 2015-05-28 DIAGNOSIS — I10 Essential (primary) hypertension: Secondary | ICD-10-CM | POA: Insufficient documentation

## 2015-05-28 DIAGNOSIS — Z7901 Long term (current) use of anticoagulants: Secondary | ICD-10-CM | POA: Insufficient documentation

## 2015-05-28 DIAGNOSIS — Z86718 Personal history of other venous thrombosis and embolism: Secondary | ICD-10-CM | POA: Insufficient documentation

## 2015-05-28 HISTORY — DX: Asymptomatic varicose veins of unspecified lower extremity: I83.90

## 2015-05-28 HISTORY — DX: Essential (primary) hypertension: I10

## 2015-05-28 HISTORY — DX: Acute embolism and thrombosis of unspecified deep veins of unspecified distal lower extremity: I82.4Z9

## 2015-05-28 SURGERY — DONT USE, USE 1094-COLONOSCOPY, DIAGNOSTIC (SCREENING)
Anesthesia: Anesthesia General | Site: Abdomen | Wound class: Clean Contaminated

## 2015-05-28 MED ORDER — LIDOCAINE HCL 2 % IJ SOLN
INTRAMUSCULAR | Status: DC | PRN
Start: 2015-05-28 — End: 2015-05-28
  Administered 2015-05-28: 40 mg

## 2015-05-28 MED ORDER — PROPOFOL 10 MG/ML IV EMUL (WRAP)
INTRAVENOUS | Status: AC
Start: 2015-05-28 — End: ?
  Filled 2015-05-28: qty 40

## 2015-05-28 MED ORDER — LIDOCAINE HCL 2 % IJ SOLN
INTRAMUSCULAR | Status: AC
Start: 2015-05-28 — End: ?
  Filled 2015-05-28: qty 20

## 2015-05-28 MED ORDER — PROPOFOL 10 MG/ML IV EMUL (WRAP)
INTRAVENOUS | Status: DC | PRN
Start: 2015-05-28 — End: 2015-05-28
  Administered 2015-05-28: 100 mg via INTRAVENOUS

## 2015-05-28 MED ORDER — LACTATED RINGERS IV SOLN
INTRAVENOUS | Status: DC
Start: 2015-05-28 — End: 2015-05-28

## 2015-05-28 MED ORDER — FENTANYL CITRATE (PF) 50 MCG/ML IJ SOLN (WRAP)
INTRAMUSCULAR | Status: DC | PRN
Start: 2015-05-28 — End: 2015-05-28
  Administered 2015-05-28: 25 ug via INTRAVENOUS

## 2015-05-28 MED ORDER — FENTANYL CITRATE (PF) 50 MCG/ML IJ SOLN (WRAP)
INTRAMUSCULAR | Status: AC
Start: 2015-05-28 — End: ?
  Filled 2015-05-28: qty 2

## 2015-05-28 SURGICAL SUPPLY — 30 items
FORCEPS BIOPSY L240 CM JUMBO MICROMESH (Instrument)
FORCEPS BIOPSY L240 CM JUMBO MICROMESH TEETH STREAMLINE CATHETER (Instrument) IMPLANT
FORCEPS BIOPSY L240 CM LARGE CAPACITY (Instrument)
FORCEPS BIOPSY L240 CM MICROMESH TEETH STREAMLINE CATHETER NEEDLE (Instrument) IMPLANT
FORCEPS BX SS JMB RJ 4 2.8MM 240CM STRL (Instrument)
FORCEPS BX SS LG CPC RJ 4 2.4MM 240CM (Instrument)
GLOVES EXAM NITRILE ETS LG NS (Glove) ×4 IMPLANT
GOWN ISL PP PE REG LG LF FULL BCK NK TIE (Gown) ×4
GOWN ISOLATION REGULAR LARGE FULL BACK NECK TIE ELASTIC CUFF (Gown) ×2 IMPLANT
MARKER ENDOSCOPIC PERMANENT INDICATION (Syringes, Needles)
MARKER ENDOSCOPIC PERMANENT INDICATION DARK SYRINGE SPOT EX 5 ML (Syringes, Needles) IMPLANT
MARKER ESCP 5ML SPOT EX PERM INDCT DRK (Syringes, Needles)
NEEDLE CARR-LOCKE INJECT 25GX5 (Needles) ×2 IMPLANT
PAD ELECTROSRG GRND REM W CRD (Procedure Accessories) IMPLANT
PROBE ELECTROSURGICAL L220 CM FLEXIBLE (Procedure Accessories)
PROBE ELECTROSURGICAL L220 CM FLEXIBLE STRAIGHT FIRE OD2.3 MM FIAPC (Procedure Accessories) IMPLANT
PROBE ESURG FIAPC 2.3MM 220CM STRL FLXB (Procedure Accessories)
SNARE ELECTRO SURG 20MM (Procedure Accessories) IMPLANT
SNARE ESCP MIC CPTVTR 13MM 240IN STRL (GE Lab Supplies)
SNARE SENSATION OVAL 27MM (Endoscopic Supplies) IMPLANT
SNARE SMALL HEXAGON CAPTIVATOR STIFF ENDOSCOPIC POLYPECTOMY (GE Lab Supplies) IMPLANT
SPONGE GAUZE L4 IN X W4 IN 16 PLY (Dressing) ×1
SPONGE GAUZE L4 IN X W4 IN 16 PLY MAXIMUM ABSORBENT USP TYPE VII (Dressing) ×1 IMPLANT
SPONGE GZE CTTN CRTY 4X4IN LF NS 16 PLY (Dressing) ×1
SYRINGE 50 ML GRADUATE NONPYROGENIC DEHP (Syringes, Needles)
SYRINGE 50 ML GRADUATE NONPYROGENIC DEHP FREE PVC FREE BD MEDICAL (Syringes, Needles) IMPLANT
SYRINGE MED 50ML LF STRL GRAD N-PYRG (Syringes, Needles)
TRAP  MUCOUS SPECIMEN 40CC (Procedure Accessories) IMPLANT
WATER STERILE PLASTIC POUR BOTTLE 250 ML (Irrigation Solutions) ×1 IMPLANT
WATER STRL 250ML LF PLS PR BTL (Irrigation Solutions) ×1

## 2015-05-28 NOTE — H&P (Signed)
GI PRE PROCEDURE NOTE    Proceduralist Comments:   Review of Systems and Past Medical / Surgical History performed: Yes     Indications:Colon cancer screening    Previous Adverse Reaction to Anesthesia or Sedation (if yes, describe): No    Physical Exam / Laboratory Data (If applicable)   Airway Classification: Class II    General: Alert and cooperative  Lungs: Lungs clear to auscultation  Cardiac: RRR, normal S1S2.    Abdomen: Soft, non tender. Normal active bowel sounds  Other:     No labs drawn    American Society of Anesthesiologists (ASA) Physical Status Classification:   ASA 2 - Patient with mild systemic disease with no functional limitations    Planned Sedation:   Deep sedation with anesthesia    Attestation:   Ryan Hunter has been reassessed immediately prior to the procedure and is an appropriate candidate for the planned sedation and procedure. Risks, benefits and alternatives to the planned procedure and sedation have been explained to the patient or guardian:  yes        Signed by: Graciella Belton

## 2015-05-28 NOTE — Discharge Instructions (Signed)
Colonoscopy Discharge Instructions  General Instructions:  1. Following sedation, your judgement, perception, and coordination are considered impaired. Even though you may feel awake and alert, you are considered legally intoxicated. Therefore, until the next morning;   Do not Drive   Do not operate appliances or equipment that requires reaction time (e.g.Stove, electrical tools, machinery)   Do not sign legal documents or be involved in important decisions.   Do not smoke if alone   Do not drink alcoholic beverages   Go directly home and rest for several hours before resuming your routine activities.   It is highly recommended to have a responsible adult stay with you for the next 24 hours    2. Tenderness, swelling or pain may occur at the IV site where you received sedation. If you experience this, apply warm soaks to the area. Notify your physician if this persists.    Instructions Specific To Procedures - Report To Physician Any Of The Following:    Colon/Sigmoidoscopy/Proctoscopy   1. Severe and persistent abdominal pain/bloating which does not subside within 2-3 hours   2. Large amount of rectal bleeding (some mucosal blood streaking may occur, especially if biopsy or polypectomy was done or if hemorrhoids are present.   3. Nausea/vomitting   4. Fevers/Chills within 24 hours after procedure. Temp>101deg F     In Addition:   If polyp has been removed, DO NOT take aspirin or aspirin containing products (e.g. Anacin, Alka Seltzer, Bufferin, Etc.) or non-steroidal anti-inflammatory drugs (e.g. Advil, Motrin, etc.) for *** days unless otherwise advised by doctor. Tylenol  or extra Strength Tylenol is permitted.    Additional Discharge Instructions  Your diet after the procedure:  Start with something light (Toast, Jello, Soup, Etc.), Then Resume to Regular Diet as Tolerated. Nothing Spicy, Greasy or Fried Foods Today.  Special Instructions: ***  Prescriptions given: {YES (DEF)/NO:21773}  Patient education  literature given; {YES (DEF)/NO:21773}      If you have questions or problems contact your MD immediately. If you need immediate attention, call your MD, 911 and/or go to nearest emergency room.

## 2015-05-28 NOTE — Progress Notes (Signed)
Awake, denies pain, tolerating sips of ginger ale

## 2015-05-28 NOTE — Anesthesia Postprocedure Evaluation (Signed)
Anesthesia Post Evaluation    Patient: Ryan Hunter    Procedures performed: Procedure(s) with comments:  COLONOSCOPY - COLONOSCOPY  ANES=MCA; Q1=N    Anesthesia type: General TIVA    Patient location:Phase II PACU    Vitals: Reviewed and Stable.  See nurses notes for vital signs.      Post pain: Patient not complaining of pain, continue current therapy     Mental Status:awake    Respiratory Function: Doing well on room air    Cardiovascular: stable    Nausea/Vomiting: patient not complaining of nausea or vomiting    Hydration Status: adequate    Post assessment: no apparent anesthetic complications, no reportable events and no evidence of recall    Vernona Rieger C. Filipescu-Turner, MD

## 2015-05-28 NOTE — Anesthesia Preprocedure Evaluation (Addendum)
Anesthesia Evaluation    AIRWAY    Mallampati: I    TM distance: >3 FB  Neck ROM: full  Mouth Opening:full   CARDIOVASCULAR    regular and normal       DENTAL    no notable dental hx     PULMONARY    clear to auscultation     OTHER FINDINGS                      Anesthesia Plan    ASA 2     general               (Discussed health history and anesthesia -plan. )      Detailed anesthesia plan: general IV        Post op pain management: per surgeon    informed consent obtained      pertinent labs reviewed

## 2015-05-29 ENCOUNTER — Encounter: Payer: Self-pay | Admitting: Gastroenterology

## 2016-12-22 ENCOUNTER — Telehealth: Payer: Self-pay | Admitting: *Deleted

## 2016-12-22 NOTE — Telephone Encounter (Signed)
Call from pt reporting he has moved back to this area. Requests to reestablish care with Dr. Benay Spice.

## 2016-12-23 NOTE — Telephone Encounter (Signed)
Ok, 45 min. Next 2 months Be sure getting PT checked if still on coumadin

## 2016-12-23 NOTE — Telephone Encounter (Signed)
Returned call to pt, he reports he is moving back in the summer or fall. He remains on Coumadin. Having INR checked monthly. Instructed him to call back about a month prior to moving. He agreed to do so, he will also request MD send a final note from his last visit.

## 2017-06-22 ENCOUNTER — Telehealth: Payer: Self-pay

## 2017-06-22 DIAGNOSIS — Z7901 Long term (current) use of anticoagulants: Secondary | ICD-10-CM

## 2017-06-22 NOTE — Telephone Encounter (Signed)
Pt has a move in date of Sept 25th. He is wanting to make an appt to re-establish with Dr Benay Spice. He is still taking warfarin and wants Dr Benay Spice to manage this.   He is also wanting a referral or suggestion of PCP in [place]. Male preferably without heavy accent.  His new address will be [place], [place],  [address]. His phone number is not changed.

## 2017-08-05 NOTE — Telephone Encounter (Signed)
Message to schedulers for lab early Oct, lab/office early Dec.

## 2017-08-05 NOTE — Addendum Note (Signed)
Addended by: Brien Few on: 08/05/2017 03:58 PM   Modules accepted: Orders

## 2017-08-30 ENCOUNTER — Other Ambulatory Visit (HOSPITAL_BASED_OUTPATIENT_CLINIC_OR_DEPARTMENT_OTHER): Payer: MEDICARE

## 2017-08-30 ENCOUNTER — Telehealth: Payer: Self-pay | Admitting: *Deleted

## 2017-08-30 DIAGNOSIS — Z86718 Personal history of other venous thrombosis and embolism: Secondary | ICD-10-CM | POA: Diagnosis not present

## 2017-08-30 DIAGNOSIS — Z7901 Long term (current) use of anticoagulants: Secondary | ICD-10-CM

## 2017-08-30 LAB — PROTIME-INR
INR: 2.4 (ref 2.00–3.50)
Protime: 28.8 Seconds — ABNORMAL HIGH (ref 10.6–13.4)

## 2017-08-30 NOTE — Telephone Encounter (Signed)
-----   Message from Ladell Pier, MD sent at 08/30/2017  1:53 PM EDT ----- Please call patient, pt is therapeutic, same coumadin, repeat PT 1 month

## 2017-08-30 NOTE — Telephone Encounter (Signed)
Left message for pt to continue same dose of Coumadin. Check lab in one month. Requested he call office with current dose.

## 2017-08-30 NOTE — Telephone Encounter (Signed)
Pt dropped off a log of INR results and Coumadin dosages from 2014 to present. He is currently taking 10mg  daily.

## 2017-08-31 ENCOUNTER — Telehealth: Payer: Self-pay | Admitting: Oncology

## 2017-08-31 NOTE — Telephone Encounter (Signed)
Scheduled appt per 10/2 sch message - patient is aware of appt date and time.

## 2017-09-21 ENCOUNTER — Telehealth: Payer: Self-pay

## 2017-09-21 NOTE — Telephone Encounter (Signed)
Dr Romie Minus DDS is asking how long Dr Benay Spice wants pt to be off warfarin before he gets 2 teeth extracted. Pt is not scheduled yet awaiting Dr Gearldine Shown response.   Dr Romie Minus phone is [phone]

## 2017-09-22 NOTE — Telephone Encounter (Signed)
Per Dr. Benay Spice: Hold Coumadin for 3 nights prior to tooth extraction. Resume evening of extraction. Called Dr. Mingo Amber office with this information.

## 2017-09-26 ENCOUNTER — Telehealth: Payer: Self-pay

## 2017-09-26 NOTE — Telephone Encounter (Signed)
Pt switched to [facility] when he moved to [facility]. His new mail order pharmacy is [facility] delivery.  Pt will be bringing a list of his meds on 11/5 and will highlight what he needs refilled at this time. He is asking for a 90 day 1 time supply so he has time to get established with a new PCP.  He is also asking for suggestions from Dr Benay Spice for PCP.

## 2017-09-30 ENCOUNTER — Other Ambulatory Visit: Payer: PRIVATE HEALTH INSURANCE

## 2017-10-03 ENCOUNTER — Telehealth: Payer: Self-pay | Admitting: *Deleted

## 2017-10-03 ENCOUNTER — Other Ambulatory Visit: Payer: PRIVATE HEALTH INSURANCE

## 2017-10-03 ENCOUNTER — Other Ambulatory Visit (HOSPITAL_BASED_OUTPATIENT_CLINIC_OR_DEPARTMENT_OTHER): Payer: MEDICARE

## 2017-10-03 DIAGNOSIS — Z7901 Long term (current) use of anticoagulants: Secondary | ICD-10-CM

## 2017-10-03 DIAGNOSIS — Z86718 Personal history of other venous thrombosis and embolism: Secondary | ICD-10-CM

## 2017-10-03 LAB — PROTIME-INR
INR: 2 (ref 2.00–3.50)
Protime: 24 Seconds — ABNORMAL HIGH (ref 10.6–13.4)

## 2017-10-03 MED ORDER — WARFARIN SODIUM 10 MG PO TABS: 10.0000 mg | ORAL_TABLET | Freq: Every day | ORAL | 0 refills | Status: DC

## 2017-10-03 NOTE — Telephone Encounter (Signed)
Pt dropped off a list of his medications, requests a refill for 90 day supply. Also asking who Dr. Benay Spice recommends as PCP locally. Tooth extraction is scheduled for 11/9. Pt will take his last dose of Coumadin tonight and will resume the evening of 11/9. Message to schedulers for lab on 11/16. Today's INR reviewed by Dr. Benay Spice: Pt to proceed with above plan for Coumadin dosing. Warfarin refilled at mail order pharmacy. Per MD: Recommend [facility]. Contact PCP for refills of maintenance medications until seen by [place]. Pt voiced understanding. Pt reports he will be out of town 11/16. Instructed him to have lab checked 11/15. He voiced understanding.

## 2017-10-04 ENCOUNTER — Telehealth: Payer: Self-pay | Admitting: Oncology

## 2017-10-04 NOTE — Telephone Encounter (Signed)
Scheduled appt per 11/5 sch message - lab only - patient is aware of appt date and time.

## 2017-10-12 ENCOUNTER — Other Ambulatory Visit: Payer: Self-pay | Admitting: *Deleted

## 2017-10-12 MED ORDER — WARFARIN SODIUM 10 MG PO TABS: 10.0000 mg | ORAL_TABLET | Freq: Every day | ORAL | 0 refills | Status: DC

## 2017-10-12 NOTE — Telephone Encounter (Signed)
Message from pt requesting warfarin to be sent to [place]. He will switch to [facility] order in January. Script e-scribed.

## 2017-10-13 ENCOUNTER — Other Ambulatory Visit: Payer: Self-pay | Admitting: *Deleted

## 2017-10-13 ENCOUNTER — Other Ambulatory Visit (HOSPITAL_BASED_OUTPATIENT_CLINIC_OR_DEPARTMENT_OTHER): Payer: MEDICARE

## 2017-10-13 DIAGNOSIS — Z86718 Personal history of other venous thrombosis and embolism: Secondary | ICD-10-CM

## 2017-10-13 DIAGNOSIS — Z7901 Long term (current) use of anticoagulants: Secondary | ICD-10-CM

## 2017-10-13 LAB — PROTIME-INR
INR: 1.4 — ABNORMAL LOW (ref 2.00–3.50)
Protime: 16.8 Seconds — ABNORMAL HIGH (ref 10.6–13.4)

## 2017-11-07 ENCOUNTER — Ambulatory Visit: Payer: PRIVATE HEALTH INSURANCE | Admitting: Oncology

## 2017-11-07 ENCOUNTER — Other Ambulatory Visit: Payer: PRIVATE HEALTH INSURANCE

## 2017-11-09 ENCOUNTER — Other Ambulatory Visit: Payer: PRIVATE HEALTH INSURANCE

## 2017-11-09 ENCOUNTER — Ambulatory Visit: Payer: PRIVATE HEALTH INSURANCE | Admitting: Oncology

## 2017-11-15 ENCOUNTER — Encounter: Payer: Self-pay | Admitting: Oncology

## 2017-11-15 ENCOUNTER — Telehealth: Payer: Self-pay | Admitting: Oncology

## 2017-11-15 ENCOUNTER — Ambulatory Visit (HOSPITAL_BASED_OUTPATIENT_CLINIC_OR_DEPARTMENT_OTHER): Payer: MEDICARE | Admitting: Oncology

## 2017-11-15 ENCOUNTER — Other Ambulatory Visit (HOSPITAL_BASED_OUTPATIENT_CLINIC_OR_DEPARTMENT_OTHER): Payer: MEDICARE

## 2017-11-15 ENCOUNTER — Other Ambulatory Visit: Payer: Self-pay

## 2017-11-15 VITALS — BP 148/83 | HR 65 | Temp 97.8°F | Resp 18 | Ht 71.0 in | Wt 216.0 lb

## 2017-11-15 DIAGNOSIS — Z7901 Long term (current) use of anticoagulants: Secondary | ICD-10-CM | POA: Diagnosis not present

## 2017-11-15 DIAGNOSIS — I82503 Chronic embolism and thrombosis of unspecified deep veins of lower extremity, bilateral: Secondary | ICD-10-CM | POA: Diagnosis present

## 2017-11-15 DIAGNOSIS — I824Z9 Acute embolism and thrombosis of unspecified deep veins of unspecified distal lower extremity: Secondary | ICD-10-CM

## 2017-11-15 DIAGNOSIS — D696 Thrombocytopenia, unspecified: Secondary | ICD-10-CM | POA: Diagnosis not present

## 2017-11-15 LAB — PROTIME-INR
INR: 2.1 (ref 2.00–3.50)
Protime: 25.2 Seconds — ABNORMAL HIGH (ref 10.6–13.4)

## 2017-11-15 NOTE — Progress Notes (Signed)
  [place] OFFICE PROGRESS NOTE   Diagnosis: Recurrent venous thrombosis, indefinite Coumadin anticoagulation  INTERVAL HISTORY:   [facility].  He moved out of town.  He has returned to [facility] and is here to reestablish care.  He continues Coumadin anticoagulation.  He denies bleeding or symptoms of thrombosis.  He recently had several tooth extractions.  While in [place] he had a lower extremity duplex examination 02/15/2017.  He was noted to have evidence of DVTs in both legs.  He was evaluated for chronic venous stasis and prescribed support stockings.  He reports pain at the lateral aspect of the right foot for several years.  He saw a foot physician while in [place].  Objective:  Vital signs in last 24 hours:  Blood pressure (!) 148/83, pulse 65, temperature 97.8 F (36.6 C), temperature source Oral, resp. rate 18, height 5\' 11"  (1.803 m), weight 216 lb (98 kg), SpO2 99 %.   Resp: Lungs clear bilaterally Cardio: Regular rate and rhythm GI: No hepatosplenomegaly, nontender Vascular: No edema, chronic stasis change at the lower leg bilaterally with varicosities at the legs and feet.  The right leg is slightly larger than the left leg. Musculoskeletal: Bony prominence at the lateral aspect of the right foot.   Portacath/PICC-without erythema  Lab Results:   Lab Results  Component Value Date   INR 2.10 11/15/2017     Medications: I have reviewed the patient's current medications.   Assessment/Plan: 1. 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 He will return for a monthly PT check.  2. Indwelling IVC catheter.  3. 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        4. 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        5. erectile dysfunction-followed by Dr. [name] , status post placement  of a penile implant at [facility] in April 2014       6. Neck discomfort actually likely related to benign musculoskeletal condition-he will see Dr. [name]       7. CT of the abdomen/pelvis at [facility] urology February 2014 for evaluation of hematuria-12 mm external iliac nodes and left              periaortic retroperitoneal lymph node-11 mm    Disposition: Chad Gross appears unchanged compared to when I saw him 2014.  He continues indefinite Coumadin anticoagulation.  The PT/INR is therapeutic.  He will continue Coumadin at current dose.  He will return for a monthly PT/INR check.  He will be scheduled for an office visit in 1 year.  He has a history of chronic thrombocytopenia.  We will check a CBC when he returns next month.  I made a referral to [facility] internal medicine to establish with a primary care physician.  25 minutes were spent with the patient today.  The majority of the time was used for counseling and coordination of care.  Betsy Coder, MD  11/15/2017  11:11 AM

## 2017-11-15 NOTE — Telephone Encounter (Signed)
Scheduled appt per 12/18 - per GBS suppose to be monthly labs - gave patient AVS and calender per los.

## 2017-12-14 ENCOUNTER — Inpatient Hospital Stay: Payer: MEDICARE | Attending: Oncology

## 2017-12-14 ENCOUNTER — Telehealth: Payer: Self-pay | Admitting: *Deleted

## 2017-12-14 DIAGNOSIS — Z86718 Personal history of other venous thrombosis and embolism: Secondary | ICD-10-CM | POA: Diagnosis not present

## 2017-12-14 DIAGNOSIS — Z7901 Long term (current) use of anticoagulants: Secondary | ICD-10-CM | POA: Insufficient documentation

## 2017-12-14 DIAGNOSIS — D696 Thrombocytopenia, unspecified: Secondary | ICD-10-CM

## 2017-12-14 DIAGNOSIS — I824Z9 Acute embolism and thrombosis of unspecified deep veins of unspecified distal lower extremity: Secondary | ICD-10-CM

## 2017-12-14 LAB — CBC WITH DIFFERENTIAL/PLATELET
Basophils Absolute: 0.1 10*3/uL (ref 0.0–0.1)
Basophils Relative: 1 %
Eosinophils Absolute: 0.2 10*3/uL (ref 0.0–0.5)
Eosinophils Relative: 1 %
HCT: 45.4 % (ref 38.4–49.9)
Hemoglobin: 15 g/dL (ref 13.0–17.1)
Lymphocytes Relative: 72 %
Lymphs Abs: 14.5 10*3/uL — ABNORMAL HIGH (ref 0.9–3.3)
MCH: 30.5 pg (ref 27.2–33.4)
MCHC: 33 g/dL (ref 32.0–36.0)
MCV: 92.4 fL (ref 79.3–98.0)
Monocytes Absolute: 0.9 10*3/uL (ref 0.1–0.9)
Monocytes Relative: 4 %
Neutro Abs: 4.3 10*3/uL (ref 1.5–6.5)
Neutrophils Relative %: 22 %
Platelets: 129 10*3/uL — ABNORMAL LOW (ref 140–400)
RBC: 4.91 MIL/uL (ref 4.20–5.82)
RDW: 12.5 % (ref 11.0–15.6)
WBC: 20.1 10*3/uL — ABNORMAL HIGH (ref 4.0–10.3)

## 2017-12-14 LAB — PROTIME-INR
INR: 2.54
Prothrombin Time: 27.1 seconds — ABNORMAL HIGH (ref 11.4–15.2)

## 2017-12-14 NOTE — Telephone Encounter (Signed)
Ask him to come in for cbc/smear and flow cytometry, PT INR prior to 2/11

## 2017-12-14 NOTE — Telephone Encounter (Signed)
-----   Message from Ladell Pier, MD sent at 12/14/2017  3:45 PM EST ----- Please call patient, PT is therapeutic, continue Coumadin same dose. 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

## 2017-12-14 NOTE — Telephone Encounter (Signed)
Called lab, they are unable to send refrigerated specimen for flow cytometry.  Called pt with INR result. Instructed him to continue same dose of Coumadin (confirmed 10 mg daily). Informed him of CBC result, elevated WBC. Pt reports he is getting over a cold, denies any other symptoms of infection. He reports he can come in for lab anytime but will be going out of town 2/11-01/31/18.  Will review with MD for further instructions re: flow cytometry.

## 2017-12-15 NOTE — Telephone Encounter (Signed)
Called pt with lab appt 1/29 @ 1100. Message to schedulers.

## 2017-12-21 ENCOUNTER — Other Ambulatory Visit: Payer: Self-pay | Admitting: *Deleted

## 2017-12-21 DIAGNOSIS — D696 Thrombocytopenia, unspecified: Secondary | ICD-10-CM

## 2017-12-27 ENCOUNTER — Inpatient Hospital Stay: Payer: MEDICARE

## 2017-12-27 DIAGNOSIS — Z86718 Personal history of other venous thrombosis and embolism: Secondary | ICD-10-CM | POA: Diagnosis not present

## 2017-12-27 DIAGNOSIS — D696 Thrombocytopenia, unspecified: Secondary | ICD-10-CM

## 2017-12-27 LAB — CBC WITH DIFFERENTIAL (CANCER CENTER ONLY)
Basophils Absolute: 0.1 10*3/uL (ref 0.0–0.1)
Basophils Relative: 1 %
Eosinophils Absolute: 0.3 10*3/uL (ref 0.0–0.5)
Eosinophils Relative: 2 %
HCT: 44.1 % (ref 38.4–49.9)
Hemoglobin: 14.6 g/dL (ref 13.0–17.1)
Lymphocytes Relative: 65 %
Lymphs Abs: 11.8 10*3/uL — ABNORMAL HIGH (ref 0.9–3.3)
MCH: 30.5 pg (ref 27.2–33.4)
MCHC: 33.1 g/dL (ref 32.0–36.0)
MCV: 92.3 fL (ref 79.3–98.0)
Monocytes Absolute: 0.9 10*3/uL (ref 0.1–0.9)
Monocytes Relative: 5 %
Neutro Abs: 4.7 10*3/uL (ref 1.5–6.5)
Neutrophils Relative %: 27 %
Platelet Count: 96 10*3/uL — ABNORMAL LOW (ref 140–400)
RBC: 4.78 MIL/uL (ref 4.20–5.82)
RDW: 12.8 % (ref 11.0–15.6)
WBC Count: 17.8 10*3/uL — ABNORMAL HIGH (ref 4.0–10.3)

## 2017-12-27 LAB — SAVE SMEAR

## 2017-12-28 ENCOUNTER — Encounter: Payer: Self-pay | Admitting: Oncology

## 2017-12-30 ENCOUNTER — Telehealth: Payer: Self-pay

## 2017-12-30 NOTE — Telephone Encounter (Signed)
Called pt regarding report of a cold.LVM for pt to call regarding any worsening/concerning symptoms. Informed that Dr. Benay Spice will call regarding results.

## 2018-01-02 ENCOUNTER — Encounter: Payer: Self-pay | Admitting: Oncology

## 2018-01-03 ENCOUNTER — Telehealth: Payer: Self-pay | Admitting: *Deleted

## 2018-01-03 NOTE — Telephone Encounter (Signed)
Late entry for 12/30/17: Dr. Benay Spice called pt with lab results.

## 2018-01-03 NOTE — Telephone Encounter (Signed)
-----   Message from Ladell Pier, MD sent at 12/30/2017  2:07 PM EST ----- Chad Gross has a peripheral lymphocytosis.  He has mild thrombocytopenia. Review of the peripheral blood smear is consistent with a diagnosis of CLL.  There are increased numbers of mature appearing lymphocytes.  The platelets appear normal in number.  The smear is thick making it difficult to assess the red cell morphology, but the red cells appear unremarkable.  I discussed the diagnosis of CLL with Chad Gross by telephone.  No treatment is indicated at present.  We will discuss this further when he returns for an office visit.  We discussed the increased risk of infection in patients with chronic lymphocytic leukemia.  He will stay up-to-date on pneumonia and influenza vaccines.  He will seek medical attention for symptoms of an infection.

## 2018-01-06 ENCOUNTER — Inpatient Hospital Stay: Payer: MEDICARE | Attending: Oncology | Admitting: Oncology

## 2018-01-06 ENCOUNTER — Other Ambulatory Visit: Payer: Self-pay | Admitting: *Deleted

## 2018-01-06 ENCOUNTER — Telehealth: Payer: Self-pay | Admitting: Oncology

## 2018-01-06 ENCOUNTER — Encounter: Payer: Self-pay | Admitting: Oncology

## 2018-01-06 ENCOUNTER — Inpatient Hospital Stay: Payer: MEDICARE

## 2018-01-06 VITALS — BP 155/81 | HR 73 | Temp 98.3°F | Resp 18 | Ht 71.0 in | Wt 212.7 lb

## 2018-01-06 DIAGNOSIS — Z7901 Long term (current) use of anticoagulants: Secondary | ICD-10-CM | POA: Insufficient documentation

## 2018-01-06 DIAGNOSIS — C9110 Chronic lymphocytic leukemia of B-cell type not having achieved remission: Secondary | ICD-10-CM | POA: Insufficient documentation

## 2018-01-06 DIAGNOSIS — D696 Thrombocytopenia, unspecified: Secondary | ICD-10-CM | POA: Insufficient documentation

## 2018-01-06 DIAGNOSIS — I824Z9 Acute embolism and thrombosis of unspecified deep veins of unspecified distal lower extremity: Secondary | ICD-10-CM

## 2018-01-06 DIAGNOSIS — D6859 Other primary thrombophilia: Secondary | ICD-10-CM | POA: Diagnosis not present

## 2018-01-06 DIAGNOSIS — Z23 Encounter for immunization: Secondary | ICD-10-CM | POA: Insufficient documentation

## 2018-01-06 DIAGNOSIS — N529 Male erectile dysfunction, unspecified: Secondary | ICD-10-CM | POA: Diagnosis not present

## 2018-01-06 LAB — CBC WITH DIFFERENTIAL/PLATELET
Basophils Absolute: 0 10*3/uL (ref 0.0–0.1)
Basophils Relative: 0 %
Eosinophils Absolute: 0.4 10*3/uL (ref 0.0–0.5)
Eosinophils Relative: 2 %
HCT: 44.4 % (ref 38.4–49.9)
Hemoglobin: 15.1 g/dL (ref 13.0–17.1)
Lymphocytes Relative: 76 %
Lymphs Abs: 18.5 10*3/uL — ABNORMAL HIGH (ref 0.9–3.3)
MCH: 31.5 pg (ref 27.2–33.4)
MCHC: 34 g/dL (ref 32.0–36.0)
MCV: 92.7 fL (ref 79.3–98.0)
Monocytes Absolute: 1 10*3/uL — ABNORMAL HIGH (ref 0.1–0.9)
Monocytes Relative: 4 %
Neutro Abs: 4.4 10*3/uL (ref 1.5–6.5)
Neutrophils Relative %: 18 %
Platelets: 113 10*3/uL — ABNORMAL LOW (ref 140–400)
RBC: 4.79 MIL/uL (ref 4.20–5.82)
RDW: 12.6 % (ref 11.0–14.6)
WBC: 24.2 10*3/uL — ABNORMAL HIGH (ref 4.0–10.3)
nRBC: 0 /100 WBC

## 2018-01-06 LAB — FLOW CYTOMETRY

## 2018-01-06 LAB — PROTIME-INR
INR: 2.87
Prothrombin Time: 29.9 seconds — ABNORMAL HIGH (ref 11.4–15.2)

## 2018-01-06 MED ORDER — WARFARIN SODIUM 10 MG PO TABS
10.0000 mg | ORAL_TABLET | Freq: Every day | ORAL | 3 refills | Status: DC
Start: 2018-01-06 — End: 2019-01-16

## 2018-01-06 MED ORDER — PNEUMOCOCCAL 13-VAL CONJ VACC IM SUSP
0.5000 mL | Freq: Once | INTRAMUSCULAR | Status: AC
  Administered 2018-01-06: 0.5 mL via INTRAMUSCULAR
  Filled 2018-01-06: qty 0.5

## 2018-01-06 NOTE — Telephone Encounter (Signed)
Per 2/8 patient decline avs and calendar

## 2018-01-06 NOTE — Telephone Encounter (Signed)
Notified pt of INR result. Continue same dose of Coumadin, per Dr. Benay Spice. He voiced understanding.

## 2018-01-06 NOTE — Progress Notes (Signed)
  [place] OFFICE PROGRESS NOTE   Diagnosis: Hypercoagulation syndrome, CLL  INTERVAL HISTORY:   Chad Gross was recently found to have a peripheral lymphocytosis.  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  I discussed this with him by telephone.  He returns today for further discussion.  He has a good appetite.  No fever or night sweats.  He has not noticed palpable lymph nodes.  He continues Coumadin anticoagulation.  Objective:  Vital signs in last 24 hours:  Blood pressure (!) 155/81, pulse 73, temperature 98.3 F (36.8 C), temperature source Oral, resp. rate 18, height 5\' 11"  (1.803 m), weight 212 lb 11.2 oz (96.5 kg), SpO2 96 %.    HEENT: Neck without mass Lymphatics: 1/2-1 cm low bilateral cervical and submental nodes.  1/2-1 cm bilateral axillary nodes.  No inguinal nodes. Resp: Lungs clear bilaterally Cardio: Regular rate and rhythm GI: No hepatosplenomegaly Vascular: No leg edema      Lab Results:  Lab Results  Component Value Date   WBC 24.2 (H) 01/06/2018   HGB 15.1 01/06/2018   HCT 44.4 01/06/2018   MCV 92.7 01/06/2018   PLT 113 (L) 01/06/2018   NEUTROABS 4.4 01/06/2018    CMP     Component Value Date/Time   NA 138 07/04/2012 1344   K 4.3 07/04/2012 1344   CL 105 07/04/2012 1344   CO2 25 07/04/2012 1344   GLUCOSE 79 07/04/2012 1344   BUN 11 07/04/2012 1344   CREATININE 1.05 07/04/2012 1344   CALCIUM 9.0 07/04/2012 1344   PROT 6.9 05/27/2011 1000   ALBUMIN 4.2 05/27/2011 1000   AST 19 05/27/2011 1000   ALT 20 05/27/2011 1000   ALKPHOS 32 (L) 05/27/2011 1000   BILITOT 0.8 05/27/2011 1000     Medications: I have reviewed the patient's current medications.   Assessment/Plan: 1. 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 He will return for a monthly PT check.  2. Indwelling IVC catheter.  3. 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    4. Mild thrombocytopenia, stable and chronic.  Likely secondary to CLL 5. erectile dysfunction-followed by Dr. Jeffie Pollock , status post placement of a penile implant at [facility] in April 2014  7. CT of the abdomen/pelvis at [facility] urology February 2014 for evaluation of hematuria-12 mm external iliac nodes and leftperiaortic retroperitoneal lymph node-11 mm-potentially related to CLL      8.  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, peripheral lymphocytosis and small palpable lymph nodes   Disposition: Chad Gross continues Coumadin anticoagulation.  He has been diagnosed with chronic lymphocytic leukemia.  He is asymptomatic and appears to have early stage disease.  I discussed the diagnosis, prognosis, and indications for treatment with him.  There is no indication for treatment at present.  He is at increased risk for infections.  He will seek medical attention for signs/symptoms of an infection.  He will stay up-to-date on the influenza and pneumonia vaccines.  He received a 13 valent pneumococcal vaccine today.  He will return for a PT/INR in 1 month.  He will be scheduled for an office visit and CBC in 3 months.  25 minutes were spent with the patient today.  The majority of the time was used for counseling and coordination of care.  Betsy Coder, MD  01/06/2018  4:28 PM

## 2018-01-09 ENCOUNTER — Other Ambulatory Visit: Payer: PRIVATE HEALTH INSURANCE

## 2018-02-07 ENCOUNTER — Encounter: Payer: Self-pay | Admitting: Internal Medicine

## 2018-02-07 ENCOUNTER — Ambulatory Visit (INDEPENDENT_AMBULATORY_CARE_PROVIDER_SITE_OTHER): Payer: MEDICARE | Admitting: Internal Medicine

## 2018-02-07 ENCOUNTER — Inpatient Hospital Stay: Payer: MEDICARE | Attending: Oncology

## 2018-02-07 ENCOUNTER — Other Ambulatory Visit (INDEPENDENT_AMBULATORY_CARE_PROVIDER_SITE_OTHER): Payer: MEDICARE

## 2018-02-07 VITALS — BP 126/88 | HR 64 | Temp 98.4°F | Ht 71.0 in | Wt 213.0 lb

## 2018-02-07 DIAGNOSIS — Z0000 Encounter for general adult medical examination without abnormal findings: Secondary | ICD-10-CM

## 2018-02-07 DIAGNOSIS — M1990 Unspecified osteoarthritis, unspecified site: Secondary | ICD-10-CM | POA: Insufficient documentation

## 2018-02-07 DIAGNOSIS — Z7901 Long term (current) use of anticoagulants: Secondary | ICD-10-CM | POA: Insufficient documentation

## 2018-02-07 DIAGNOSIS — Z1159 Encounter for screening for other viral diseases: Secondary | ICD-10-CM

## 2018-02-07 DIAGNOSIS — N529 Male erectile dysfunction, unspecified: Secondary | ICD-10-CM | POA: Insufficient documentation

## 2018-02-07 DIAGNOSIS — Z0001 Encounter for general adult medical examination with abnormal findings: Secondary | ICD-10-CM | POA: Insufficient documentation

## 2018-02-07 DIAGNOSIS — D696 Thrombocytopenia, unspecified: Secondary | ICD-10-CM | POA: Insufficient documentation

## 2018-02-07 DIAGNOSIS — R220 Localized swelling, mass and lump, head: Secondary | ICD-10-CM | POA: Diagnosis not present

## 2018-02-07 DIAGNOSIS — M79671 Pain in right foot: Secondary | ICD-10-CM

## 2018-02-07 DIAGNOSIS — C9110 Chronic lymphocytic leukemia of B-cell type not having achieved remission: Secondary | ICD-10-CM | POA: Diagnosis present

## 2018-02-07 DIAGNOSIS — D6859 Other primary thrombophilia: Secondary | ICD-10-CM | POA: Insufficient documentation

## 2018-02-07 DIAGNOSIS — I82409 Acute embolism and thrombosis of unspecified deep veins of unspecified lower extremity: Secondary | ICD-10-CM | POA: Insufficient documentation

## 2018-02-07 DIAGNOSIS — I2699 Other pulmonary embolism without acute cor pulmonale: Secondary | ICD-10-CM | POA: Insufficient documentation

## 2018-02-07 DIAGNOSIS — H8110 Benign paroxysmal vertigo, unspecified ear: Secondary | ICD-10-CM | POA: Insufficient documentation

## 2018-02-07 DIAGNOSIS — G8929 Other chronic pain: Secondary | ICD-10-CM

## 2018-02-07 DIAGNOSIS — C9190 Lymphoid leukemia, unspecified not having achieved remission: Secondary | ICD-10-CM | POA: Diagnosis not present

## 2018-02-07 DIAGNOSIS — I319 Disease of pericardium, unspecified: Secondary | ICD-10-CM | POA: Insufficient documentation

## 2018-02-07 DIAGNOSIS — R161 Splenomegaly, not elsewhere classified: Secondary | ICD-10-CM | POA: Insufficient documentation

## 2018-02-07 HISTORY — DX: Chronic lymphocytic leukemia of B-cell type not having achieved remission: C91.10

## 2018-02-07 HISTORY — DX: Other chronic pain: G89.29

## 2018-02-07 LAB — CBC WITH DIFFERENTIAL/PLATELET
Basophils Absolute: 0.1 10*3/uL (ref 0.0–0.1)
Basophils Relative: 0.3 % (ref 0.0–3.0)
Eosinophils Absolute: 0.4 10*3/uL (ref 0.0–0.7)
Eosinophils Relative: 1.5 % (ref 0.0–5.0)
HCT: 46.5 % (ref 39.0–52.0)
Hemoglobin: 15.8 g/dL (ref 13.0–17.0)
Lymphocytes Relative: 75.6 % — ABNORMAL HIGH (ref 12.0–46.0)
Lymphs Abs: 19.9 10*3/uL — ABNORMAL HIGH (ref 0.7–4.0)
MCHC: 34.1 g/dL (ref 30.0–36.0)
MCV: 91.9 fl (ref 78.0–100.0)
Monocytes Absolute: 0.6 10*3/uL (ref 0.1–1.0)
Monocytes Relative: 2.2 % — ABNORMAL LOW (ref 3.0–12.0)
Neutro Abs: 5.4 10*3/uL (ref 1.4–7.7)
Neutrophils Relative %: 20.4 % — ABNORMAL LOW (ref 43.0–77.0)
Platelets: 119 10*3/uL — ABNORMAL LOW (ref 150.0–400.0)
RBC: 5.06 Mil/uL (ref 4.22–5.81)
RDW: 13.2 % (ref 11.5–15.5)
WBC: 26.4 10*3/uL (ref 4.0–10.5)

## 2018-02-07 LAB — HEPATIC FUNCTION PANEL
ALT: 18 U/L (ref 0–53)
AST: 18 U/L (ref 0–37)
Albumin: 4.7 g/dL (ref 3.5–5.2)
Alkaline Phosphatase: 38 U/L — ABNORMAL LOW (ref 39–117)
Bilirubin, Direct: 0.1 mg/dL (ref 0.0–0.3)
Total Bilirubin: 0.7 mg/dL (ref 0.2–1.2)
Total Protein: 7 g/dL (ref 6.0–8.3)

## 2018-02-07 LAB — BASIC METABOLIC PANEL
BUN: 18 mg/dL (ref 6–23)
CO2: 32 mEq/L (ref 19–32)
Calcium: 9.5 mg/dL (ref 8.4–10.5)
Chloride: 99 mEq/L (ref 96–112)
Creatinine, Ser: 1.33 mg/dL (ref 0.40–1.50)
GFR: 56.12 mL/min — ABNORMAL LOW (ref >60.00)
Glucose, Bld: 94 mg/dL (ref 70–99)
Potassium: 4.2 mEq/L (ref 3.5–5.1)
Sodium: 138 mEq/L (ref 135–145)

## 2018-02-07 LAB — LIPID PANEL
Cholesterol: 141 mg/dL (ref 0–200)
HDL: 45.9 mg/dL (ref >39.00)
LDL Cholesterol: 68 mg/dL (ref 0–99)
NonHDL: 94.7
Total CHOL/HDL Ratio: 3
Triglycerides: 136 mg/dL (ref 0.0–149.0)
VLDL: 27.2 mg/dL (ref 0.0–40.0)

## 2018-02-07 LAB — PROTIME-INR
INR: 1.86
Prothrombin Time: 21.3 seconds — ABNORMAL HIGH (ref 11.4–15.2)

## 2018-02-07 LAB — PSA: PSA: 0.86 ng/mL (ref 0.10–4.00)

## 2018-02-07 LAB — TSH: TSH: 3.56 u[IU]/mL (ref 0.35–4.50)

## 2018-02-07 MED ORDER — TRAMADOL HCL 50 MG PO TABS: 50.0000 mg | ORAL_TABLET | Freq: Four times a day (QID) | ORAL | 3 refills | Status: DC | PRN

## 2018-02-07 MED ORDER — MELOXICAM 7.5 MG PO TABS: 7.5000 mg | ORAL_TABLET | Freq: Every day | ORAL | 3 refills | Status: DC

## 2018-02-07 MED ORDER — PRAVASTATIN SODIUM 40 MG PO TABS: 40.0000 mg | ORAL_TABLET | Freq: Every day | ORAL | 3 refills | Status: DC

## 2018-02-07 MED ORDER — LOSARTAN POTASSIUM-HCTZ 50-12.5 MG PO TABS: 1.0000 | ORAL_TABLET | Freq: Every day | ORAL | 3 refills | Status: DC

## 2018-02-07 MED ORDER — ZOSTER VAC RECOMB ADJUVANTED 50 MCG/0.5ML IM SUSR: 0.5000 mL | Freq: Once | INTRAMUSCULAR | 1 refills | Status: AC

## 2018-02-07 MED ORDER — LEVOCETIRIZINE DIHYDROCHLORIDE 5 MG PO TABS: 5.0000 mg | ORAL_TABLET | Freq: Every evening | ORAL | 3 refills | Status: DC | PRN

## 2018-02-07 NOTE — Patient Instructions (Signed)
Please continue all other medications as before, and refills have been done if requested.  Please have the pharmacy call with any other refills you may need.  Please continue your efforts at being more active, low cholesterol diet, and weight control.  You are otherwise up to date with prevention measures today.  Please keep your appointments with your specialists as you may have planned  You will be contacted regarding the referral for: Abdomen ultrasound  Please go to the LAB in the Basement (turn left off the elevator) for the tests to be done today  You will be contacted by phone if any changes need to be made immediately.  Otherwise, you will receive a letter about your results with an explanation, but please check with MyChart first.  Please remember to sign up for MyChart if you have not done so, as this will be important to you in the future with finding out test results, communicating by private email, and scheduling acute appointments online when needed.  Please return in 1 year for your yearly visit, or sooner if needed

## 2018-02-07 NOTE — Progress Notes (Signed)
Subjective:    Patient ID: Chad Gross, male    DOB: 1946-06-30, 72 y.o.   MRN: [id number]  HPI  Here for wellness and f/u;  Overall doing ok;  Pt denies Chest pain, worsening SOB, DOE, wheezing, orthopnea, PND, worsening LE edema, palpitations, dizziness or syncope.  Pt denies neurological change such as new headache, facial or extremity weakness.  Pt denies polydipsia, polyuria, or low sugar symptoms. Pt states overall good compliance with treatment and medications, good tolerability, and has been trying to follow appropriate diet.  Pt denies worsening depressive symptoms, suicidal ideation or panic. No fever, night sweats, wt loss, loss of appetite, or other constitutional symptoms.  Pt states good ability with ADL's, has low fall risk, home safety reviewed and adequate, no other significant changes in hearing or vision, and only occasionally active with exercise. Has appt for f/u CLL in May 2019 after recent diagnosis. Pt continues to have recurring LBP without change in severity, bowel or bladder change, fever, wt loss,  worsening LE pain/numbness/weakness, gait change or falls. - takes baclofen daily.    No other new complaints or interval hx Past Medical History:  Diagnosis Date  . Chronic foot pain, right 02/07/2018  . CLL (chronic lymphocytic leukemia) ([place]) 02/07/2018  . ED (erectile dysfunction)   . History of DVT (deep vein thrombosis)   . Hyperlipidemia   . Hypertension   . Pericarditis   . Pulmonary embolism [facility])    Past Surgical History:  Procedure Laterality Date  . ABDOMINAL HYSTERECTOMY    . CARDIAC CATHETERIZATION  01/21/2011   EF 50-55%  . GREENFIELD FLITER PLACEMENT    . INGUINAL HERNIA REPAIR     BILATERAL    reports that  has never smoked. he has never used smokeless tobacco. He reports that he does not drink alcohol or use drugs. family history includes Breast cancer in his mother; Heart failure in his father; Hypertension in his father and mother; Pneumonia  in his father. No Known Allergies Current Outpatient Medications on File Prior to Visit  Medication Sig Dispense Refill  . baclofen (LIORESAL) 10 MG tablet Take 10 mg by mouth daily.    . Calcium-Magnesium-Vitamin D (CALCIUM MAGNESIUM PO) Take 1 tablet by mouth daily.    . Coenzyme Q10 (COQ10) 100 MG CAPS Take 100 mg by mouth daily.    . Multiple Vitamin (MULTIVITAMIN) tablet Take 1 tablet by mouth daily.      . Multiple Vitamin (MULTIVITAMIN) tablet Take 1 tablet by mouth daily. Calcium, Magnesium, & Zinc    . warfarin (COUMADIN) 10 MG tablet Take 1 tablet (10 mg total) by mouth daily at 6 PM. TAKE 1 TABLET DAILY AS DIRECTED. 90 tablet 3   No current facility-administered medications on file prior to visit.    Review of Systems Constitutional: Negative for other unusual diaphoresis, sweats, appetite or weight changes HENT: Negative for other worsening hearing loss, ear pain, facial swelling, mouth sores or neck stiffness.   Eyes: Negative for other worsening pain, redness or other visual disturbance.  Respiratory: Negative for other stridor or swelling Cardiovascular: Negative for other palpitations or other chest pain  Gastrointestinal: Negative for worsening diarrhea or loose stools, blood in stool, distention or other pain Genitourinary: Negative for hematuria, flank pain or other change in urine volume.  Musculoskeletal: Negative for myalgias or other joint swelling.  Skin: Negative for other color change, or other wound or worsening drainage.  Neurological: Negative for other syncope or numbness. Hematological: Negative  for other adenopathy or swelling Psychiatric/Behavioral: Negative for hallucinations, other worsening agitation, SI, self-injury, or new decreased concentration All other systm neg per pt    Objective:   Physical Exam BP 126/88   Pulse 64   Temp 98.4 F (36.9 C) (Oral)   Ht 5\' 11"  (1.803 m)   Wt 213 lb (96.6 kg)   SpO2 98%   BMI 29.71 kg/m  VS noted,    Constitutional: Pt is oriented to person, place, and time. Appears well-developed and well-nourished, in no significant distress and comfortable Head: Normocephalic and atraumatic  Eyes: Conjunctivae and EOM are normal. Pupils are equal, round, and reactive to light Right Ear: External ear normal without discharge Left Ear: External ear normal without discharge Nose: Nose without discharge or deformity Mouth/Throat: Oropharynx is without other ulcerations and moist  Neck: Normal range of motion. Neck supple. No JVD present. No tracheal deviation present or significant neck LA or mass except for several ? Significant firm < 1 cm subq nodularity near the left ankle of jaw Cardiovascular: Normal rate, regular rhythm, normal heart sounds and intact distal pulses Pulmonary/Chest: WOB normal and breath sounds without rales or wheezing  Abdominal: Soft. Bowel sounds are normal. NT. I suspect mild splenomegaly by exam Musculoskeletal: Normal range of motion. Exhibits no edema Lymphadenopathy: Has no other cervical adenopathy.  Neurological: Pt is alert and oriented to person, place, and time. Pt has normal reflexes. No cranial nerve deficit. Motor grossly intact, Gait intact Skin: Skin is warm and dry. No rash noted or new ulcerations Psychiatric:  Has normal mood and affect. Behavior is normal without agitation No other exam findings    Assessment & Plan:

## 2018-02-07 NOTE — Assessment & Plan Note (Signed)

## 2018-02-07 NOTE — Assessment & Plan Note (Signed)
For f/u cbc today given possible left neck LA and ? Spenomegaly, has f/u currently May 2019 with oncology

## 2018-02-08 ENCOUNTER — Other Ambulatory Visit: Payer: PRIVATE HEALTH INSURANCE

## 2018-02-08 ENCOUNTER — Telehealth: Payer: Self-pay

## 2018-02-08 LAB — HEPATITIS C ANTIBODY
Hepatitis C Ab: NONREACTIVE
SIGNAL TO CUT-OFF: 0.02 (ref <1.00)

## 2018-02-08 NOTE — Telephone Encounter (Addendum)
Pt voiced understanding and alerted that he will send a message via MyChart for Dr. Benay Spice   ----- Message from Ladell Pier, MD sent at 02/07/2018 11:20 PM EDT ----- Please call patient, continue same coumadin, check PT in 1 month

## 2018-02-10 ENCOUNTER — Encounter: Payer: Self-pay | Admitting: Oncology

## 2018-02-21 ENCOUNTER — Encounter: Payer: Self-pay | Admitting: Internal Medicine

## 2018-02-21 MED ORDER — LOSARTAN POTASSIUM 50 MG PO TABS: 50.0000 mg | ORAL_TABLET | Freq: Every day | ORAL | 3 refills | Status: DC

## 2018-02-21 MED ORDER — HYDROCHLOROTHIAZIDE 12.5 MG PO TABS: 12.5000 mg | ORAL_TABLET | Freq: Every day | ORAL | 3 refills | Status: DC

## 2018-02-27 ENCOUNTER — Other Ambulatory Visit: Payer: Self-pay | Admitting: Internal Medicine

## 2018-02-27 DIAGNOSIS — C9110 Chronic lymphocytic leukemia of B-cell type not having achieved remission: Secondary | ICD-10-CM

## 2018-02-27 DIAGNOSIS — R161 Splenomegaly, not elsewhere classified: Secondary | ICD-10-CM

## 2018-03-02 ENCOUNTER — Encounter: Payer: Self-pay | Admitting: Internal Medicine

## 2018-03-02 ENCOUNTER — Encounter: Payer: Self-pay | Admitting: Oncology

## 2018-03-10 ENCOUNTER — Encounter: Payer: Self-pay | Admitting: Oncology

## 2018-03-13 ENCOUNTER — Encounter: Payer: Self-pay | Admitting: Internal Medicine

## 2018-03-13 ENCOUNTER — Other Ambulatory Visit: Payer: Self-pay | Admitting: Internal Medicine

## 2018-03-13 DIAGNOSIS — D72829 Elevated white blood cell count, unspecified: Secondary | ICD-10-CM

## 2018-03-14 ENCOUNTER — Other Ambulatory Visit: Payer: Self-pay | Admitting: Nurse Practitioner

## 2018-03-14 DIAGNOSIS — I82409 Acute embolism and thrombosis of unspecified deep veins of unspecified lower extremity: Secondary | ICD-10-CM

## 2018-03-14 DIAGNOSIS — Z7901 Long term (current) use of anticoagulants: Secondary | ICD-10-CM

## 2018-03-15 ENCOUNTER — Telehealth: Payer: Self-pay

## 2018-03-15 ENCOUNTER — Inpatient Hospital Stay: Payer: MEDICARE | Attending: Oncology

## 2018-03-15 DIAGNOSIS — N529 Male erectile dysfunction, unspecified: Secondary | ICD-10-CM | POA: Diagnosis not present

## 2018-03-15 DIAGNOSIS — D696 Thrombocytopenia, unspecified: Secondary | ICD-10-CM | POA: Insufficient documentation

## 2018-03-15 DIAGNOSIS — Z7901 Long term (current) use of anticoagulants: Secondary | ICD-10-CM | POA: Diagnosis not present

## 2018-03-15 DIAGNOSIS — C9110 Chronic lymphocytic leukemia of B-cell type not having achieved remission: Secondary | ICD-10-CM | POA: Insufficient documentation

## 2018-03-15 DIAGNOSIS — D6859 Other primary thrombophilia: Secondary | ICD-10-CM | POA: Insufficient documentation

## 2018-03-15 DIAGNOSIS — I82409 Acute embolism and thrombosis of unspecified deep veins of unspecified lower extremity: Secondary | ICD-10-CM

## 2018-03-15 LAB — PROTIME-INR
INR: 1.88
Prothrombin Time: 21.4 seconds — ABNORMAL HIGH (ref 11.4–15.2)

## 2018-03-15 NOTE — Telephone Encounter (Signed)
Labs Faxed to Dr. Judi Cong office per pt request

## 2018-03-16 ENCOUNTER — Telehealth: Payer: Self-pay

## 2018-03-16 NOTE — Telephone Encounter (Addendum)
Pt voiced understanding of message below   ----- Message from Owens Shark, NP sent at 03/16/2018  8:51 AM EDT ----- Please let him know to continue Coumadin at current dose.  Repeat PT/INR in 1 month.

## 2018-03-18 ENCOUNTER — Encounter: Payer: Self-pay | Admitting: Internal Medicine

## 2018-03-20 ENCOUNTER — Telehealth: Payer: Self-pay | Admitting: Oncology

## 2018-03-20 NOTE — Telephone Encounter (Signed)
Appointment scheduled per referral/ patient notified also

## 2018-03-22 ENCOUNTER — Ambulatory Visit
Admission: RE | Admit: 2018-03-22 | Discharge: 2018-03-22 | Disposition: A | Discharge status: Home / Self Care | Payer: MEDICARE | Source: Ambulatory Visit | Attending: Internal Medicine | Admitting: Internal Medicine

## 2018-03-22 DIAGNOSIS — R161 Splenomegaly, not elsewhere classified: Secondary | ICD-10-CM

## 2018-03-22 DIAGNOSIS — C9110 Chronic lymphocytic leukemia of B-cell type not having achieved remission: Secondary | ICD-10-CM

## 2018-03-24 ENCOUNTER — Telehealth: Payer: Self-pay | Admitting: *Deleted

## 2018-03-24 ENCOUNTER — Encounter: Payer: Self-pay | Admitting: Oncology

## 2018-03-24 NOTE — Telephone Encounter (Signed)
Pt has been referred back to Dr. Benay Spice for elevated WBC. Pt has known CLL diagnosis. Left message for him to call office, per Dr. Benay Spice: Follow up 5/9 as scheduled unless having new issues.

## 2018-03-25 ENCOUNTER — Encounter: Payer: Self-pay | Admitting: Oncology

## 2018-03-27 ENCOUNTER — Ambulatory Visit: Payer: PRIVATE HEALTH INSURANCE | Admitting: Oncology

## 2018-03-28 ENCOUNTER — Encounter: Payer: Self-pay | Admitting: Internal Medicine

## 2018-04-06 ENCOUNTER — Telehealth: Payer: Self-pay | Admitting: Oncology

## 2018-04-06 ENCOUNTER — Inpatient Hospital Stay: Payer: MEDICARE | Attending: Oncology | Admitting: Oncology

## 2018-04-06 ENCOUNTER — Inpatient Hospital Stay: Payer: MEDICARE

## 2018-04-06 VITALS — BP 132/86 | HR 73 | Temp 98.1°F | Resp 17 | Ht 71.0 in | Wt 215.9 lb

## 2018-04-06 DIAGNOSIS — C9110 Chronic lymphocytic leukemia of B-cell type not having achieved remission: Secondary | ICD-10-CM | POA: Diagnosis present

## 2018-04-06 DIAGNOSIS — I82409 Acute embolism and thrombosis of unspecified deep veins of unspecified lower extremity: Secondary | ICD-10-CM

## 2018-04-06 DIAGNOSIS — Z86718 Personal history of other venous thrombosis and embolism: Secondary | ICD-10-CM | POA: Diagnosis not present

## 2018-04-06 DIAGNOSIS — Z7901 Long term (current) use of anticoagulants: Secondary | ICD-10-CM

## 2018-04-06 DIAGNOSIS — M7989 Other specified soft tissue disorders: Secondary | ICD-10-CM | POA: Insufficient documentation

## 2018-04-06 DIAGNOSIS — D696 Thrombocytopenia, unspecified: Secondary | ICD-10-CM | POA: Diagnosis not present

## 2018-04-06 DIAGNOSIS — M25461 Effusion, right knee: Secondary | ICD-10-CM | POA: Insufficient documentation

## 2018-04-06 LAB — CBC WITH DIFFERENTIAL (CANCER CENTER ONLY)
Basophils Absolute: 0 10*3/uL (ref 0.0–0.1)
Basophils Relative: 0 %
Eosinophils Absolute: 0.3 10*3/uL (ref 0.0–0.5)
Eosinophils Relative: 1 %
HCT: 45.5 % (ref 38.4–49.9)
Hemoglobin: 15.4 g/dL (ref 13.0–17.1)
Lymphocytes Relative: 77 %
Lymphs Abs: 14.9 10*3/uL — ABNORMAL HIGH (ref 0.9–3.3)
MCH: 31.4 pg (ref 27.2–33.4)
MCHC: 33.8 g/dL (ref 32.0–36.0)
MCV: 92.9 fL (ref 79.3–98.0)
Monocytes Absolute: 1 10*3/uL — ABNORMAL HIGH (ref 0.1–0.9)
Monocytes Relative: 5 %
Neutro Abs: 3.3 10*3/uL (ref 1.5–6.5)
Neutrophils Relative %: 17 %
Platelet Count: 97 10*3/uL — ABNORMAL LOW (ref 140–400)
RBC: 4.9 MIL/uL (ref 4.20–5.82)
RDW: 13 % (ref 11.0–14.6)
WBC Count: 19.6 10*3/uL — ABNORMAL HIGH (ref 4.0–10.3)

## 2018-04-06 LAB — PROTIME-INR
INR: 1.72
Prothrombin Time: 20 seconds — ABNORMAL HIGH (ref 11.4–15.2)

## 2018-04-06 LAB — LACTATE DEHYDROGENASE: LDH: 211 U/L (ref 125–245)

## 2018-04-06 NOTE — Progress Notes (Signed)
  [place] OFFICE PROGRESS NOTE   Diagnosis: CLL, anticoagulation therapy  INTERVAL HISTORY:   Chad Gross returns as scheduled.  He feels well.  No bleeding or symptom of thrombosis.  No fever or night sweats.  Good appetite.  Objective:  Vital signs in last 24 hours:  Blood pressure 132/86, pulse 73, temperature 98.1 F (36.7 C), temperature source Oral, resp. rate 17, height 5\' 11"  (1.803 m), weight 215 lb 14.4 oz (97.9 kg), SpO2 98 %.    HEENT: Neck without mass Lymphatics: Soft 1-2 cm submandibular and submental nodes.  2 cm low left posterior cervical/scalene node.  1-2 cm right axillary nodes Resp: Lungs clear bilaterally Cardio: Regular rate and rhythm GI: No hepatosplenomegaly Vascular: No leg edema Skin: Multiple erythematous dry lesions over the forehead and scalp    Lab Results:  Lab Results  Component Value Date   WBC 19.6 (H) 04/06/2018   HGB 15.4 04/06/2018   HCT 45.5 04/06/2018   MCV 92.9 04/06/2018   PLT 97 (L) 04/06/2018   NEUTROABS 3.3 04/06/2018   Absolute lymphocyte count 14.9 CMP     Component Value Date/Time   NA 138 02/07/2018 1543   K 4.2 02/07/2018 1543   CL 99 02/07/2018 1543   CO2 32 02/07/2018 1543   GLUCOSE 94 02/07/2018 1543   BUN 18 02/07/2018 1543   CREATININE 1.33 02/07/2018 1543   CALCIUM 9.5 02/07/2018 1543   PROT 7.0 02/07/2018 1543   ALBUMIN 4.7 02/07/2018 1543   AST 18 02/07/2018 1543   ALT 18 02/07/2018 1543   ALKPHOS 38 (L) 02/07/2018 1543   BILITOT 0.7 02/07/2018 1543    Lab Results  Component Value Date   INR 1.88 03/15/2018     Medications: I have reviewed the patient's current medications.   Assessment/Plan: 1. 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 He will return for a monthly PT check.  2. Indwelling IVC catheter.  3. 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  4. Mild thrombocytopenia,  stable and chronic.  Likely secondary to CLL 5. erectile dysfunction-followed by Dr. Jeffie Pollock , status post placement of a penile implant at [facility] in April 2014  7. CT of the abdomen/pelvis at [facility] urology February 2014 for evaluation of hematuria-12 mm external iliac nodes and leftperiaortic retroperitoneal lymph node-11 mm-potentially related to CLL      8.  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, peripheral lymphocytosis and small palpable lymph nodes    Disposition: Mr. Biven appears stable.  He has CLL.  He has early stage CLL and is asymptomatic at present.  There is no indication for treating the CLL.  He understands that he is at increased risk for infections with the CLL.  He will stay up-to-date on influenza and pneumonia vaccines.  The mild thrombocytopenia is likely secondary to CLL.  We will follow-up on the PT/INR from today.  He will contact us for bleeding. He will schedule a dermatology appointment to evaluate the skin lesions over the face and scalp.  He will return for a PT/INR in 1 month and a CBC in 2 months.  He will be scheduled for a 85-month office visit.  We will adjust the Coumadin dose as needed.   Betsy Coder, MD  04/06/2018  11:12 AM

## 2018-04-06 NOTE — Telephone Encounter (Signed)
Appointments scheduled AVS printed per 5/9 los

## 2018-04-07 ENCOUNTER — Telehealth: Payer: Self-pay

## 2018-04-07 NOTE — Telephone Encounter (Addendum)
Spoke to pt. In regards to note below. Pt verbalized understanding of keeping the coumadin dose the same.  ----- Message from Ladell Pier, MD sent at 04/06/2018  3:50 PM EDT ----- Please call patient, same coumadin, if INR still below 2 next month we will increase the coumadin dose

## 2018-04-10 LAB — IMMUNOFIXATION ELECTROPHORESIS
IgA: 168 mg/dL (ref 61–437)
IgG (Immunoglobin G), Serum: 838 mg/dL (ref 700–1600)
IgM (Immunoglobulin M), Srm: 58 mg/dL (ref 15–143)
Total Protein ELP: 6.4 g/dL (ref 6.0–8.5)

## 2018-04-11 ENCOUNTER — Encounter: Payer: Self-pay | Admitting: Oncology

## 2018-04-12 ENCOUNTER — Encounter: Payer: Self-pay | Admitting: Oncology

## 2018-04-12 DIAGNOSIS — I82409 Acute embolism and thrombosis of unspecified deep veins of unspecified lower extremity: Secondary | ICD-10-CM

## 2018-04-13 NOTE — Telephone Encounter (Signed)
Pt called today to report the RLE pain and swelling is worse. Reviewed with Dr. Benay Spice: Verbal order received and read back for RLE doppler. Office visit 5/17@ 1230 to review result, evaluate swelling. Pt voiced understanding. Requested I leave message with doppler appt. Returned call, informed pt of doppler appt 04/14/18 @ 0900 at [facility]. He voiced understanding.

## 2018-04-14 ENCOUNTER — Telehealth: Payer: Self-pay | Admitting: Oncology

## 2018-04-14 ENCOUNTER — Telehealth: Payer: Self-pay

## 2018-04-14 ENCOUNTER — Inpatient Hospital Stay: Payer: MEDICARE

## 2018-04-14 ENCOUNTER — Other Ambulatory Visit: Payer: Self-pay | Admitting: *Deleted

## 2018-04-14 ENCOUNTER — Inpatient Hospital Stay (HOSPITAL_BASED_OUTPATIENT_CLINIC_OR_DEPARTMENT_OTHER): Payer: MEDICARE | Admitting: Oncology

## 2018-04-14 ENCOUNTER — Ambulatory Visit (HOSPITAL_COMMUNITY)
Admission: RE | Admit: 2018-04-14 | Discharge: 2018-04-14 | Disposition: A | Discharge status: Home / Self Care | Payer: MEDICARE | Source: Ambulatory Visit | Attending: Oncology | Admitting: Oncology

## 2018-04-14 VITALS — BP 133/82 | HR 69 | Temp 98.2°F | Resp 18 | Wt 218.2 lb

## 2018-04-14 DIAGNOSIS — Z7901 Long term (current) use of anticoagulants: Secondary | ICD-10-CM | POA: Diagnosis not present

## 2018-04-14 DIAGNOSIS — C9110 Chronic lymphocytic leukemia of B-cell type not having achieved remission: Secondary | ICD-10-CM | POA: Diagnosis not present

## 2018-04-14 DIAGNOSIS — I82409 Acute embolism and thrombosis of unspecified deep veins of unspecified lower extremity: Secondary | ICD-10-CM | POA: Diagnosis present

## 2018-04-14 DIAGNOSIS — R936 Abnormal findings on diagnostic imaging of limbs: Secondary | ICD-10-CM | POA: Diagnosis not present

## 2018-04-14 DIAGNOSIS — M25461 Effusion, right knee: Secondary | ICD-10-CM

## 2018-04-14 DIAGNOSIS — M7989 Other specified soft tissue disorders: Secondary | ICD-10-CM

## 2018-04-14 DIAGNOSIS — Z86718 Personal history of other venous thrombosis and embolism: Secondary | ICD-10-CM

## 2018-04-14 LAB — PROTIME-INR
INR: 1.83
Prothrombin Time: 21 seconds — ABNORMAL HIGH (ref 11.4–15.2)

## 2018-04-14 LAB — D-DIMER, QUANTITATIVE: D-Dimer, Quant: 0.38 ug/mL-FEU (ref 0.00–0.50)

## 2018-04-14 MED ORDER — WARFARIN SODIUM 2.5 MG PO TABS: 2.5000 mg | ORAL_TABLET | ORAL | 0 refills | Status: DC

## 2018-04-14 NOTE — Progress Notes (Signed)
Right lower extremity venous duplex has been completed. There is evidence of age indeterminate deep vein thrombosis involving the popliteal, and intramuscular gastrocnemius veins of the right lower extremity. There is also evidence of age indeterminate superficial vein thrombosis involving the short saphenous vein of the right lower extremity. Results were given to [facility] at Dr. Gearldine Shown office.  04/14/18 9:28 AM Carlos Levering RVT

## 2018-04-14 NOTE — Telephone Encounter (Signed)
Appointments scheduled AVS/Calendar printed per 5/17 los °

## 2018-04-14 NOTE — Progress Notes (Signed)
Chad Gross OFFICE PROGRESS NOTE   Diagnosis: CLL, hypercoagulation syndrome  INTERVAL HISTORY:   Chad Gross returns prior to his scheduled visit.  He reports discomfort at the right popliteal and upper calf region beginning 04/09/2018.  He has noted increased swelling of the right leg.  He recently took a trip to [place].  No trauma.  He is taking Coumadin.  The pain is worse with weightbearing.  The pain is not as severe as discomfort he is experienced with previous deep vein thromboses.  Objective:  Vital signs in last 24 hours:  There were no vitals taken for this visit.     Vascular: There are varicosities and chronic stasis change at the legs bilaterally.  The right leg is larger than the right side.  No erythema or palpable cord.   Musculoskeletal: There is swelling surrounding the right knee joint.  This is mild.  There appears to be a mild effusion with suprapatellar fullness.  He has discomfort with flexion/extension at the right knee.    Lab Results:  Lab Results  Component Value Date   WBC 19.6 (H) 04/06/2018   HGB 15.4 04/06/2018   HCT 45.5 04/06/2018   MCV 92.9 04/06/2018   PLT 97 (L) 04/06/2018   NEUTROABS 3.3 04/06/2018    CMP     Component Value Date/Time   NA 138 02/07/2018 1543   K 4.2 02/07/2018 1543   CL 99 02/07/2018 1543   CO2 32 02/07/2018 1543   GLUCOSE 94 02/07/2018 1543   BUN 18 02/07/2018 1543   CREATININE 1.33 02/07/2018 1543   CALCIUM 9.5 02/07/2018 1543   PROT 7.0 02/07/2018 1543   ALBUMIN 4.7 02/07/2018 1543   AST 18 02/07/2018 1543   ALT 18 02/07/2018 1543   ALKPHOS 38 (L) 02/07/2018 1543   BILITOT 0.7 02/07/2018 1543    No results found for: CEA1  Lab Results  Component Value Date   INR 1.83 04/14/2018     Medications: I have reviewed the patient's current medications.   Assessment/Plan: 1. 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 He will  return for a monthly PT check.  2. Indwelling IVC catheter.  3. 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  4. 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 erectile dysfunction-followed by Dr. [name] , status post placement of a penile implant at [facility] in April 2014  7. CT of the abdomen/pelvis at [facility] urology February 2014 for evaluation of hematuria-12 mm external iliac nodes and leftperiaortic retroperitoneal lymph node-11 mm-potentially related to CLL 8.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, peripheral lymphocytosis and small palpable lymph nodes    Disposition: Chad Gross presents for an unscheduled visit.  He has discomfort in the right leg.  He appears to have a right knee effusion.  The pain is worse with weightbearing and appears to be mainly localized to the right knee.  I have a low clinical suspicion for an acute deep vein thrombosis.  A Doppler of the right lower extremity reveals age indeterminate deep vein thrombosis in the popliteal and gastrocnemius veins.  There is also age-indeterminate thrombosis in the short saphenous vein.  The PT/INR is subtherapeutic.  We will check a d-dimer.We will initiate a short course of Lovenox if the d-dimer returns elevated.  We will adjust the Coumadin dose and ask him to return for a repeat PT/INR in 2 weeks.  I suspect the pain and swelling are related to a benign musculoskeletal condition involving the right knee.  He will apply ice and elevate the leg.  He will contact us if the pain is not improved over the next week and we will make an orthopedic referral.  25 minutes were spent with the patient today.  The majority of the time was used for counseling and coordination of care.  Betsy Coder, MD  04/14/2018  3:38 PM

## 2018-04-14 NOTE — Telephone Encounter (Signed)
Pt was seen in office to discuss doppler result.

## 2018-04-14 NOTE — Telephone Encounter (Signed)
Left detailed message on voicemail for pt to call office. Per Dr. Benay Spice: D-Dimer was negative. Increase coumadin dose to 12.5mg  QMWF. Repeat lab in 2 weeks. Requested pt call back to confirm instructions.

## 2018-04-14 NOTE — Telephone Encounter (Signed)
Per Marya Amsler, Vascular Ultrasound Tech, "right lower extremity venous duplex completed. DVT in popliteal Gastocnemius noted. Difficult to tell if old or new. Same clot in right short saphenous."  [phone] call back number for Marya Amsler if any questions. Please follow-up

## 2018-04-17 ENCOUNTER — Encounter: Payer: Self-pay | Admitting: Oncology

## 2018-04-17 ENCOUNTER — Ambulatory Visit: Payer: PRIVATE HEALTH INSURANCE | Admitting: Oncology

## 2018-04-18 ENCOUNTER — Encounter: Payer: Self-pay | Admitting: Oncology

## 2018-04-19 ENCOUNTER — Other Ambulatory Visit: Payer: PRIVATE HEALTH INSURANCE

## 2018-04-19 ENCOUNTER — Telehealth: Payer: Self-pay

## 2018-04-19 NOTE — Telephone Encounter (Signed)
Spoke with pt regarding MyChart message. Pt confirmed "swelling in back of right leg and behind right knee". Pt denies, tenderness, redness, bruising, or heat on back of leg/knee. Pt states "there's pain when I stand up from sitting down for like a few minutes then it goes away. It feels like its getting a little better, but I want someone to look at it". This RN voiced understanding. Information provided for [facility]. Will send referral for patient. Pt voiced understanding

## 2018-04-20 ENCOUNTER — Encounter: Payer: Self-pay | Admitting: Oncology

## 2018-05-01 ENCOUNTER — Other Ambulatory Visit: Payer: Self-pay | Admitting: Orthopaedic Surgery

## 2018-05-01 ENCOUNTER — Telehealth: Payer: Self-pay

## 2018-05-01 NOTE — Telephone Encounter (Signed)
Call from pt to report he's having "surgery on right knee for a torn meniscus". Pt states that Dr. Dan Europe will call Dr. Benay Spice to consult about "when to stop the coumadin if I need to". Per Dr. Benay Spice, will await call from MD to determine action with coumadin.  Pt states that they "are looking to schedule surgery on this Thursday". Will consult with MD.   Per Dr. Benay Spice, surgeon can make decision regarding coumadin, if needed Dr. Benay Spice can decide. LVM for pt

## 2018-05-03 ENCOUNTER — Encounter (HOSPITAL_COMMUNITY)
Admission: RE | Admit: 2018-05-03 | Discharge: 2018-05-03 | Disposition: A | Discharge status: Home / Self Care | Payer: MEDICARE | Source: Ambulatory Visit | Attending: Orthopaedic Surgery | Admitting: Orthopaedic Surgery

## 2018-05-03 ENCOUNTER — Encounter (HOSPITAL_COMMUNITY): Payer: Self-pay

## 2018-05-03 DIAGNOSIS — Z86718 Personal history of other venous thrombosis and embolism: Secondary | ICD-10-CM | POA: Diagnosis not present

## 2018-05-03 DIAGNOSIS — Z79899 Other long term (current) drug therapy: Secondary | ICD-10-CM | POA: Diagnosis not present

## 2018-05-03 DIAGNOSIS — M94261 Chondromalacia, right knee: Secondary | ICD-10-CM | POA: Diagnosis not present

## 2018-05-03 DIAGNOSIS — Z7901 Long term (current) use of anticoagulants: Secondary | ICD-10-CM | POA: Diagnosis not present

## 2018-05-03 DIAGNOSIS — I2510 Atherosclerotic heart disease of native coronary artery without angina pectoris: Secondary | ICD-10-CM | POA: Diagnosis not present

## 2018-05-03 DIAGNOSIS — M23221 Derangement of posterior horn of medial meniscus due to old tear or injury, right knee: Secondary | ICD-10-CM | POA: Diagnosis present

## 2018-05-03 DIAGNOSIS — C9110 Chronic lymphocytic leukemia of B-cell type not having achieved remission: Secondary | ICD-10-CM | POA: Diagnosis not present

## 2018-05-03 DIAGNOSIS — E785 Hyperlipidemia, unspecified: Secondary | ICD-10-CM | POA: Diagnosis not present

## 2018-05-03 DIAGNOSIS — I10 Essential (primary) hypertension: Secondary | ICD-10-CM | POA: Diagnosis not present

## 2018-05-03 DIAGNOSIS — Z86711 Personal history of pulmonary embolism: Secondary | ICD-10-CM | POA: Diagnosis not present

## 2018-05-03 LAB — CBC
HCT: 45.4 % (ref 39.0–52.0)
Hemoglobin: 15.3 g/dL (ref 13.0–17.0)
MCH: 31 pg (ref 26.0–34.0)
MCHC: 33.7 g/dL (ref 30.0–36.0)
MCV: 92.1 fL (ref 78.0–100.0)
Platelets: 122 10*3/uL — ABNORMAL LOW (ref 150–400)
RBC: 4.93 MIL/uL (ref 4.22–5.81)
RDW: 12.3 % (ref 11.5–15.5)
WBC: 26.9 10*3/uL — ABNORMAL HIGH (ref 4.0–10.5)

## 2018-05-03 LAB — BASIC METABOLIC PANEL
Anion gap: 6 (ref 5–15)
BUN: 22 mg/dL — ABNORMAL HIGH (ref 6–20)
CO2: 26 mmol/L (ref 22–32)
Calcium: 9 mg/dL (ref 8.9–10.3)
Chloride: 105 mmol/L (ref 101–111)
Creatinine, Ser: 1.31 mg/dL — ABNORMAL HIGH (ref 0.61–1.24)
GFR calc Af Amer: >60 mL/min (ref >60)
GFR calc non Af Amer: 53 mL/min — ABNORMAL LOW (ref >60)
Glucose, Bld: 79 mg/dL (ref 65–99)
Potassium: 4.8 mmol/L (ref 3.5–5.1)
Sodium: 137 mmol/L (ref 135–145)

## 2018-05-03 NOTE — H&P (Signed)
Chad Gross is an 72 y.o. male.   Chief Complaint: Right knee pain HPI: Chad Gross is here again about his right knee which continues to bother him terribly.  We aspirated and injected a couple of weeks ago and it helped for a day or two.  His knee is again swollen.  He has been through the Doppler which ruled out a DVT and has also now been through an MRI scan for review of which he presents today.  He has intermittent severe pain.  This does wake him from sleep.  MRI:  I reviewed an MRI scan films and report of a study done at the [facility] on 04/29/18.  This shows an impressive posterior horn medial meniscus tear with a paucity of degenerative change.  Past Medical History:  Diagnosis Date  . Chronic foot pain, right 02/07/2018  . CLL (chronic lymphocytic leukemia) ([place]) 02/07/2018  . ED (erectile dysfunction)   . History of DVT (deep vein thrombosis)   . Hyperlipidemia   . Hypertension   . Pericarditis   . Pulmonary embolism [facility])     Past Surgical History:  Procedure Laterality Date  . CARDIAC CATHETERIZATION  01/21/2011   EF 50-55%  . GREENFIELD FLITER PLACEMENT    . INGUINAL HERNIA REPAIR     BILATERAL    Family History  Problem Relation Age of Onset  . Breast cancer Mother   . Hypertension Mother   . Heart failure Father   . Pneumonia Father   . Hypertension Father    Social History:  reports that he has never smoked. He has never used smokeless tobacco. He reports that he drinks alcohol. He reports that he does not use drugs.  Allergies: No Known Allergies  No medications prior to admission.    Results for orders placed or performed during the hospital encounter of 05/03/18 (from the past 48 hour(s))  CBC     Status: Abnormal   Collection Time: 05/03/18 11:12 AM  Result Value Ref Range   WBC 26.9 (H) 4.0 - 10.5 K/uL   RBC 4.93 4.22 - 5.81 MIL/uL   Hemoglobin 15.3 13.0 - 17.0 g/dL   HCT 45.4 39.0 - 52.0 %   MCV 92.1 78.0 - 100.0 fL   MCH 31.0 26.0 - 34.0 pg    MCHC 33.7 30.0 - 36.0 g/dL   RDW 12.3 11.5 - 15.5 %   Platelets 122 (L) 150 - 400 K/uL    Comment: Performed at [facility] [address], [place], [place] [address]  Basic metabolic panel     Status: Abnormal   Collection Time: 05/03/18 11:12 AM  Result Value Ref Range   Sodium 137 135 - 145 mmol/L   Potassium 4.8 3.5 - 5.1 mmol/L   Chloride 105 101 - 111 mmol/L   CO2 26 22 - 32 mmol/L   Glucose, Bld 79 65 - 99 mg/dL   BUN 22 (H) 6 - 20 mg/dL   Creatinine, Ser 1.31 (H) 0.61 - 1.24 mg/dL   Calcium 9.0 8.9 - 10.3 mg/dL   GFR calc non Af Amer 53 (L) >60 mL/min   GFR calc Af Amer >60 >60 mL/min    Comment: (NOTE) The eGFR has been calculated using the CKD EPI equation. This calculation has not been validated in all clinical situations. eGFR's persistently <60 mL/min signify possible Chronic Kidney Disease.    Anion gap 6 5 - 15    Comment: Performed at [facility], [address] [place], [place] [address]   No results found.  Review of Systems  Musculoskeletal: Positive for joint pain.       Right knee  All other systems reviewed and are negative.   Weight 99 kg (218 lb 4.1 oz). Physical Exam  Constitutional: He is oriented to person, place, and time. He appears well-developed and well-nourished.  HENT:  Head: Normocephalic and atraumatic.  Eyes: Pupils are equal, round, and reactive to light.  Neck: Normal range of motion.  Cardiovascular: Normal rate and regular rhythm.  Respiratory: Effort normal.  GI: Soft.  Musculoskeletal:  Right knee again has 2+ effusion.  He has pain more medial than lateral.  His motion is about 0-100 at which point he has some terrible pain.  Hip motion is full and straight leg raise is negative.  Neurological: He is alert and oriented to person, place, and time.  Skin: Skin is warm and dry.  Psychiatric: He has a normal mood and affect. His behavior is normal. Judgment and thought content normal.      Assessment/Plan Assessment: Right knee torn medial meniscus by MRI 2019 injected 04/19/18  Plan: Chad Gross has some terrible pain in the leg.  He did not respond for very long to aspiration and injection.  By MRI scan he has an impressive meniscus tear and very little degenerative change.  I think we can help him with a knee arthroscopy. I reviewed risks of anesthesia and infection as well as potential for DVT related to a knee arthroscopy.  I've stressed the importance of some postoperative physical therapy to optimize results and we will try to set up an appointment.  Two to four weeks for recovery would be typical but that is a little variable.  We will try to set up this surgery at some point in the coming weeks.  I would be inclined to simply leave him on his Coumadin and perform the operation potentially with a tourniquet.  Anahlia Iseminger, Larwance Sachs, PA-C 05/03/2018, 1:57 PM

## 2018-05-03 NOTE — Pre-Procedure Instructions (Signed)
   Chad Gross  05/03/2018     [id number] [address], [place] [address] [place] [address] Phone: [phone] Fax: [phone]   Your procedure is scheduled on Thursday, May 04, 2018.  Report to [facility] Admitting at 10:45 A.M.  Call this number if you have problems the morning of surgery:  [phone]   Remember: Follow surgeon's instructions regarding warfarin (COUMADIN)  Do not eat food or drink liquids after midnight  Take these medicines the morning of surgery with A SIP OF WATER: if needed:   acetaminophen (TYLENOL) OR traMADol (ULTRAM) for pain Stop taking Aspirin ( unless advised otherwise by surgeon),vitamins, fish oil,GLUCOSAMINE CHONDROITIN, Coenzyme Q10 (COQ10)   and herbal medications. Do not take any NSAIDs ie: Ibuprofen, Advil, Naproxen ( Aleve), Meloxicam ( Mobic), Motrin, BC and Goody Powder; stop now.  Do not wear jewelry, make-up or nail polish.  Do not wear lotions, powders, or perfumes, or deodorant.  Do not shave 48 hours prior to surgery.  Men may shave face and neck.  Do not bring valuables to the hospital.  [facility] is not responsible for any belongings or valuables.  Contacts, dentures or bridgework may not be worn into surgery.  Patients discharged the day of surgery will not be allowed to drive home.  Special instructions: Shower tonight and tomorrow morning with CHG. Please read over the following fact sheets that you were given. Pain Booklet, Coughing and Deep Breathing and Surgical Site Infection Prevention

## 2018-05-03 NOTE — Progress Notes (Signed)
Pt is to conti. Coumadin per Dr. Rhona Raider. Pt,ptt were not drawn pat. DR notified,Angela pa notified.oked to draw DOS

## 2018-05-03 NOTE — Progress Notes (Signed)
Anesthesia Chart Review:   Case:  [id number] Date/Time:  05/04/18 1230   Procedure:  ARTHROSCOPY KNEE (Right )   Anesthesia type:  Choice   Pre-op diagnosis:  RIGHT KNEE MEDIAL MENISCAL TEAR AND CHONDROMALACIA   Location:  [facility] OR ROOM 02 / [place] [facility]   Surgeon:  Melrose Nakayama, MD      DISCUSSION: - Pt is a 72 year old Gross with hx hypercoagulation and recurrent PE, DVT; has CLL  - Per H&P by Loni Dolly, PA, pt to remain on coumadin perioperatively and tourniquet will be used for surgery   VS: BP (!) 151/84   Pulse 76   Temp 36.7 C   Resp 18   Ht 5\' 11"  (1.803 m)   Wt 213 lb 8 oz (96.8 kg)   SpO2 98%   BMI Chad.78 kg/m    PROVIDERS: PCP is Biagio Borg, MD Hem-onc is Betsy Coder, MD  - Pt saw cardiologist Mertie Moores, MD in 2012 for elevated coronary calcium score.  Stress test showed apical defect and EF 47%; cardiac cath showed normal EF and only minor luminal irregularities.    LABS: Labs reviewed: Acceptable for surgery.  - WBC count 26.9. This is consistent with prior labs; pt has CLL - PT and PTT will be obtained day of surgery   (all labs ordered are listed, but only abnormal results are displayed)  Labs Reviewed  CBC - Abnormal; Notable for the following components:      Result Value   WBC 26.9 (*)    Platelets 122 (*)    All other components within normal limits  BASIC METABOLIC PANEL - Abnormal; Notable for the following components:   BUN 22 (*)    Creatinine, Ser 1.31 (*)    GFR calc non Af Amer 53 (*)    All other components within normal limits    EKG 05/03/18: NSR. Minimal voltage criteria for LVH, may be normal variant   CV:  Cardiac cath 01/21/11:  1. Minor coronary artery irregularities. 2. Normal left ventricular systolic function.  We will continue with medical therapy.   Past Medical History:  Diagnosis Date  . Chronic foot pain, right 02/07/2018  . CLL (chronic lymphocytic leukemia) ([place]) 02/07/2018  . ED (erectile dysfunction)   .  History of DVT (deep vein thrombosis)   . Hyperlipidemia   . Hypertension   . Pericarditis   . Pulmonary embolism [facility])     Past Surgical History:  Procedure Laterality Date  . CARDIAC CATHETERIZATION  01/21/2011   EF 50-55%  . GREENFIELD FLITER PLACEMENT    . INGUINAL HERNIA REPAIR     BILATERAL    MEDICATIONS: . acetaminophen (TYLENOL) 500 MG tablet  . baclofen (LIORESAL) 10 MG tablet  . CALCIUM-MAGNESIUM-ZINC PO  . Coenzyme Q10 (COQ10) 100 MG CAPS  . GLUCOSAMINE CHONDROITIN COMPLX PO  . hydrochlorothiazide (HYDRODIURIL) 12.5 MG tablet  . levocetirizine (XYZAL) 5 MG tablet  . losartan (COZAAR) 50 MG tablet  . meloxicam (MOBIC) 7.5 MG tablet  . Multiple Vitamins-Minerals (SENIOR MULTIVITAMIN PLUS PO)  . Omega-3 Fatty Acids (FISH OIL PO)  . pravastatin (PRAVACHOL) 40 MG tablet  . traMADol (ULTRAM) 50 MG tablet  . warfarin (COUMADIN) 10 MG tablet  . warfarin (COUMADIN) 2.5 MG tablet   No current facility-administered medications for this encounter.    - Pt to remain on coumadin perioperatively    If labs acceptable day of surgery, I anticipate pt can proceed with surgery as scheduled.  Willeen Cass, FNP-BC [facility]/Anesthesiology Phone: [phone] 05/03/2018 3:06 PM

## 2018-05-04 ENCOUNTER — Encounter (HOSPITAL_COMMUNITY)
Admission: RE | Disposition: A | Discharge status: Home / Self Care | Payer: Self-pay | Source: Ambulatory Visit | Attending: Orthopaedic Surgery

## 2018-05-04 ENCOUNTER — Ambulatory Visit (HOSPITAL_COMMUNITY)
Admission: RE | Admit: 2018-05-04 | Discharge: 2018-05-04 | Disposition: A | Discharge status: Home / Self Care | Payer: MEDICARE | Source: Ambulatory Visit | Attending: Orthopaedic Surgery | Admitting: Orthopaedic Surgery

## 2018-05-04 ENCOUNTER — Ambulatory Visit (HOSPITAL_COMMUNITY): Payer: MEDICARE | Admitting: Emergency Medicine

## 2018-05-04 ENCOUNTER — Other Ambulatory Visit: Payer: Self-pay

## 2018-05-04 ENCOUNTER — Ambulatory Visit (HOSPITAL_COMMUNITY): Payer: MEDICARE | Admitting: Certified Registered"

## 2018-05-04 ENCOUNTER — Encounter (HOSPITAL_COMMUNITY): Payer: Self-pay | Admitting: *Deleted

## 2018-05-04 DIAGNOSIS — Z79899 Other long term (current) drug therapy: Secondary | ICD-10-CM | POA: Insufficient documentation

## 2018-05-04 DIAGNOSIS — M23221 Derangement of posterior horn of medial meniscus due to old tear or injury, right knee: Secondary | ICD-10-CM | POA: Diagnosis not present

## 2018-05-04 DIAGNOSIS — I2510 Atherosclerotic heart disease of native coronary artery without angina pectoris: Secondary | ICD-10-CM | POA: Insufficient documentation

## 2018-05-04 DIAGNOSIS — M94261 Chondromalacia, right knee: Secondary | ICD-10-CM | POA: Insufficient documentation

## 2018-05-04 DIAGNOSIS — C9110 Chronic lymphocytic leukemia of B-cell type not having achieved remission: Secondary | ICD-10-CM | POA: Insufficient documentation

## 2018-05-04 DIAGNOSIS — Z86711 Personal history of pulmonary embolism: Secondary | ICD-10-CM | POA: Insufficient documentation

## 2018-05-04 DIAGNOSIS — E785 Hyperlipidemia, unspecified: Secondary | ICD-10-CM | POA: Insufficient documentation

## 2018-05-04 DIAGNOSIS — Z7901 Long term (current) use of anticoagulants: Secondary | ICD-10-CM | POA: Insufficient documentation

## 2018-05-04 DIAGNOSIS — I10 Essential (primary) hypertension: Secondary | ICD-10-CM | POA: Diagnosis not present

## 2018-05-04 DIAGNOSIS — Z86718 Personal history of other venous thrombosis and embolism: Secondary | ICD-10-CM | POA: Insufficient documentation

## 2018-05-04 HISTORY — PX: KNEE ARTHROSCOPY: SHX127

## 2018-05-04 LAB — PROTIME-INR
INR: 1.6
Prothrombin Time: 18.9 seconds — ABNORMAL HIGH (ref 11.4–15.2)

## 2018-05-04 LAB — APTT: aPTT: 29 seconds (ref 24–36)

## 2018-05-04 SURGERY — ARTHROSCOPY, KNEE
Anesthesia: General | Site: Knee | Laterality: Right

## 2018-05-04 MED ORDER — SODIUM CHLORIDE 0.9 % IR SOLN
Status: DC | PRN
  Administered 2018-05-04: 3000 mL

## 2018-05-04 MED ORDER — OXYCODONE HCL 5 MG PO TABS
5.0000 mg | ORAL_TABLET | Freq: Once | ORAL | Status: AC | PRN
  Administered 2018-05-04: 5 mg via ORAL

## 2018-05-04 MED ORDER — PROPOFOL 10 MG/ML IV BOLUS
INTRAVENOUS | Status: DC | PRN
  Administered 2018-05-04: 150 mg via INTRAVENOUS

## 2018-05-04 MED ORDER — BUPIVACAINE-EPINEPHRINE (PF) 0.5% -1:200000 IJ SOLN
INTRAMUSCULAR | Status: AC
  Filled 2018-05-04: qty 30

## 2018-05-04 MED ORDER — HYDROMORPHONE HCL 2 MG/ML IJ SOLN
INTRAMUSCULAR | Status: DC
Start: 2018-05-04 — End: 2018-05-04
  Filled 2018-05-04: qty 1

## 2018-05-04 MED ORDER — PROMETHAZINE HCL 25 MG/ML IJ SOLN: 6.2500 mg | INTRAMUSCULAR | Status: DC | PRN

## 2018-05-04 MED ORDER — BUPIVACAINE-EPINEPHRINE 0.5% -1:200000 IJ SOLN
INTRAMUSCULAR | Status: DC | PRN
  Administered 2018-05-04: 20 mL

## 2018-05-04 MED ORDER — LIDOCAINE 2% (20 MG/ML) 5 ML SYRINGE
INTRAMUSCULAR | Status: DC | PRN
  Administered 2018-05-04: 100 mg via INTRAVENOUS

## 2018-05-04 MED ORDER — CHLORHEXIDINE GLUCONATE 4 % EX LIQD: 60.0000 mL | Freq: Once | CUTANEOUS | Status: DC

## 2018-05-04 MED ORDER — CEFAZOLIN SODIUM-DEXTROSE 2-4 GM/100ML-% IV SOLN
2.0000 g | INTRAVENOUS | Status: AC
  Administered 2018-05-04: 2 g via INTRAVENOUS

## 2018-05-04 MED ORDER — MIDAZOLAM HCL 5 MG/5ML IJ SOLN
INTRAMUSCULAR | Status: DC | PRN
  Administered 2018-05-04: 1 mg via INTRAVENOUS

## 2018-05-04 MED ORDER — HYDROMORPHONE HCL 2 MG/ML IJ SOLN
0.2500 mg | INTRAMUSCULAR | Status: DC | PRN
  Administered 2018-05-04 (×2): 0.5 mg via INTRAVENOUS

## 2018-05-04 MED ORDER — OXYCODONE HCL 5 MG PO TABS
ORAL_TABLET | ORAL | Status: AC
  Filled 2018-05-04: qty 1

## 2018-05-04 MED ORDER — OXYCODONE HCL 5 MG/5ML PO SOLN: 5.0000 mg | Freq: Once | ORAL | Status: AC | PRN

## 2018-05-04 MED ORDER — MIDAZOLAM HCL 2 MG/2ML IJ SOLN
INTRAMUSCULAR | Status: AC
  Filled 2018-05-04: qty 2

## 2018-05-04 MED ORDER — LACTATED RINGERS IV SOLN
INTRAVENOUS | Status: DC
  Administered 2018-05-04: 11:00:00 via INTRAVENOUS

## 2018-05-04 MED ORDER — FENTANYL CITRATE (PF) 250 MCG/5ML IJ SOLN
INTRAMUSCULAR | Status: DC | PRN
  Administered 2018-05-04: 25 ug via INTRAVENOUS
  Administered 2018-05-04: 50 ug via INTRAVENOUS
  Administered 2018-05-04 (×2): 25 ug via INTRAVENOUS

## 2018-05-04 MED ORDER — FENTANYL CITRATE (PF) 250 MCG/5ML IJ SOLN
INTRAMUSCULAR | Status: AC
  Filled 2018-05-04: qty 5

## 2018-05-04 MED ORDER — HYDROCODONE-ACETAMINOPHEN 5-325 MG PO TABS: 1.0000 | ORAL_TABLET | Freq: Four times a day (QID) | ORAL | 0 refills | Status: DC | PRN

## 2018-05-04 SURGICAL SUPPLY — 41 items
BANDAGE ACE 6X5 VEL STRL LF (GAUZE/BANDAGES/DRESSINGS) ×3 IMPLANT
BANDAGE ESMARK 6X9 LF (GAUZE/BANDAGES/DRESSINGS) IMPLANT
BLADE CUTTER GATOR 3.5 (BLADE) ×2 IMPLANT
BLADE GREAT WHITE 4.2 (BLADE) ×2 IMPLANT
BLADE GREAT WHITE 4.2MM (BLADE) ×1
BLADE SURG 11 STRL SS (BLADE) ×2 IMPLANT
BNDG CMPR 9X6 STRL LF SNTH (GAUZE/BANDAGES/DRESSINGS) ×1
BNDG ESMARK 6X9 LF (GAUZE/BANDAGES/DRESSINGS) ×3
BNDG GAUZE ELAST 4 BULKY (GAUZE/BANDAGES/DRESSINGS) ×3 IMPLANT
COVER SURGICAL LIGHT HANDLE (MISCELLANEOUS) ×3 IMPLANT
CUFF TOURNIQUET SINGLE 34IN LL (TOURNIQUET CUFF) ×2 IMPLANT
CUFF TOURNIQUET SINGLE 44IN (TOURNIQUET CUFF) IMPLANT
DRAPE ARTHROSCOPY W/POUCH 114 (DRAPES) ×3 IMPLANT
DRAPE U-SHAPE 47X51 STRL (DRAPES) ×3 IMPLANT
DRSG EMULSION OIL 3X3 NADH (GAUZE/BANDAGES/DRESSINGS) ×3 IMPLANT
DRSG PAD ABDOMINAL 8X10 ST (GAUZE/BANDAGES/DRESSINGS) ×3 IMPLANT
DURAPREP 26ML APPLICATOR (WOUND CARE) ×3 IMPLANT
GAUZE SPONGE 4X4 12PLY STRL (GAUZE/BANDAGES/DRESSINGS) ×3 IMPLANT
GLOVE BIO SURGEON STRL SZ8 (GLOVE) ×12 IMPLANT
GLOVE BIOGEL PI IND STRL 8 (GLOVE) ×1 IMPLANT
GLOVE BIOGEL PI INDICATOR 8 (GLOVE) ×2
GOWN STRL REUS W/ TWL LRG LVL3 (GOWN DISPOSABLE) ×3 IMPLANT
GOWN STRL REUS W/ TWL XL LVL3 (GOWN DISPOSABLE) ×3 IMPLANT
GOWN STRL REUS W/TWL 2XL LVL3 (GOWN DISPOSABLE) ×3 IMPLANT
GOWN STRL REUS W/TWL LRG LVL3 (GOWN DISPOSABLE) ×9
GOWN STRL REUS W/TWL XL LVL3 (GOWN DISPOSABLE) ×9
KIT TURNOVER KIT B (KITS) ×3 IMPLANT
MANIFOLD NEPTUNE II (INSTRUMENTS) ×3 IMPLANT
NDL 18GX1X1/2 (RX/OR ONLY) (NEEDLE) ×1 IMPLANT
NDL SPNL 18GX3.5 QUINCKE PK (NEEDLE) IMPLANT
NEEDLE 18GX1X1/2 (RX/OR ONLY) (NEEDLE) ×3 IMPLANT
NEEDLE 22X1 1/2 (OR ONLY) (NEEDLE) ×2 IMPLANT
NEEDLE SPNL 18GX3.5 QUINCKE PK (NEEDLE) IMPLANT
PACK ARTHROSCOPY DSU (CUSTOM PROCEDURE TRAY) ×3 IMPLANT
PAD ABD 8X10 STRL (GAUZE/BANDAGES/DRESSINGS) ×2 IMPLANT
PAD ARMBOARD 7.5X6 YLW CONV (MISCELLANEOUS) ×6 IMPLANT
SET ARTHROSCOPY TUBING (MISCELLANEOUS) ×3
SET ARTHROSCOPY TUBING LN (MISCELLANEOUS) ×1 IMPLANT
SYR CONTROL 10ML LL (SYRINGE) ×3 IMPLANT
TOWEL OR 17X24 6PK STRL BLUE (TOWEL DISPOSABLE) ×6 IMPLANT
WATER STERILE IRR 1000ML POUR (IV SOLUTION) ×3 IMPLANT

## 2018-05-04 NOTE — Anesthesia Postprocedure Evaluation (Signed)
Anesthesia Post Note  Patient: Chad Gross  Procedure(s) Performed: ARTHROSCOPY KNEE, PARTIAL MENISCECTOMY AND CHONDROPLASTY (Right Knee)     Patient location during evaluation: PACU Anesthesia Type: General Level of consciousness: awake and alert Pain management: pain level controlled Vital Signs Assessment: post-procedure vital signs reviewed and stable Respiratory status: spontaneous breathing, nonlabored ventilation and respiratory function stable Cardiovascular status: blood pressure returned to baseline and stable Postop Assessment: no apparent nausea or vomiting Anesthetic complications: no    Last Vitals:  Vitals:   05/04/18 1415 05/04/18 1430  BP: (!) 148/75 (!) 155/79  Pulse: 67 65  Resp: 15 13  Temp:  36.6 C  SpO2: 96% 97%    Last Pain:  Vitals:   05/04/18 1430  TempSrc:   PainSc: 3         RLE Motor Response: Purposeful movement;Responds to commands (05/04/18 1430) RLE Sensation: Full sensation (05/04/18 1430)      Lynda Rainwater

## 2018-05-04 NOTE — Anesthesia Preprocedure Evaluation (Signed)
Anesthesia Evaluation  Patient identified by MRN, date of birth, ID band Patient awake    Reviewed: Allergy & Precautions, NPO status , Patient's Chart, lab work & pertinent test results  Airway Mallampati: II  TM Distance: >3 FB Neck ROM: Full    Dental no notable dental hx.    Pulmonary neg pulmonary ROS,    Pulmonary exam normal breath sounds clear to auscultation       Cardiovascular hypertension, Pt. on medications + CAD  negative cardio ROS Normal cardiovascular exam Rhythm:Regular Rate:Normal     Neuro/Psych negative neurological ROS  negative psych ROS   GI/Hepatic negative GI ROS, Neg liver ROS,   Endo/Other  negative endocrine ROS  Renal/GU negative Renal ROS  negative genitourinary   Musculoskeletal negative musculoskeletal ROS (+) Arthritis ,   Abdominal   Peds negative pediatric ROS (+)  Hematology negative hematology ROS (+)   Anesthesia Other Findings   Reproductive/Obstetrics negative OB ROS                             Anesthesia Physical Anesthesia Plan  ASA: III  Anesthesia Plan: General   Post-op Pain Management:    Induction: Intravenous  PONV Risk Score and Plan: 2 and Ondansetron and Midazolam  Airway Management Planned: LMA  Additional Equipment:   Intra-op Plan:   Post-operative Plan: Extubation in OR  Informed Consent: I have reviewed the patients History and Physical, chart, labs and discussed the procedure including the risks, benefits and alternatives for the proposed anesthesia with the patient or authorized representative who has indicated his/her understanding and acceptance.   Dental advisory given  Plan Discussed with: CRNA  Anesthesia Plan Comments:         Anesthesia Quick Evaluation

## 2018-05-04 NOTE — Op Note (Signed)
Chad Gross [id number] 05/04/2018   PRE-OP DIAGNOSIS: right TMM and CP  POST-OP DIAGNOSIS: same  PROCEDURE: right knee scope with PMM and CP  ANESTHESIA: general  Hicks Feick G   Dictation #:  [id number]

## 2018-05-04 NOTE — Transfer of Care (Signed)
Immediate Anesthesia Transfer of Care Note  Patient: Chad Gross  Procedure(s) Performed: ARTHROSCOPY KNEE, PARTIAL MENISCECTOMY AND CHONDROPLASTY (Right Knee)  Patient Location: PACU  Anesthesia Type:General  Level of Consciousness: drowsy  Airway & Oxygen Therapy: Patient Spontanous Breathing and Patient connected to nasal cannula oxygen  Post-op Assessment: Report given to RN and Post -op Vital signs reviewed and stable  Post vital signs: Reviewed and stable  Last Vitals:  Vitals Value Taken Time  BP 122/67 05/04/2018  1:27 PM  Temp    Pulse 70 05/04/2018  1:28 PM  Resp 28 05/04/2018  1:28 PM  SpO2 95 % 05/04/2018  1:28 PM  Vitals shown include unvalidated device data.  Last Pain:  Vitals:   05/04/18 1101  TempSrc:   PainSc: 6       Patients Stated Pain Goal: 2 (27/51/70 0174)  Complications: No apparent anesthesia complications

## 2018-05-04 NOTE — Progress Notes (Signed)
Orthopedic Tech Progress Note Patient Details:  Chad Gross [birth year] [id number]  Ortho Devices Type of Ortho Device: Crutches Ortho Device/Splint Interventions: Ordered, Adjustment   Post Interventions Patient Tolerated: Well Instructions Provided: Care of device   Braulio Bosch 05/04/2018, 2:37 PM

## 2018-05-04 NOTE — Anesthesia Procedure Notes (Signed)
Procedure Name: LMA Insertion Date/Time: 05/04/2018 12:46 PM Performed by: Imagene Riches, CRNA Pre-anesthesia Checklist: Patient identified, Emergency Drugs available, Suction available and Patient being monitored Patient Re-evaluated:Patient Re-evaluated prior to induction Oxygen Delivery Method: Circle System Utilized Preoxygenation: Pre-oxygenation with 100% oxygen Induction Type: IV induction Ventilation: Mask ventilation without difficulty LMA: LMA inserted LMA Size: 4.0 Number of attempts: 1 Airway Equipment and Method: Bite block Placement Confirmation: positive ETCO2 Tube secured with: Tape Dental Injury: Teeth and Oropharynx as per pre-operative assessment

## 2018-05-04 NOTE — Interval H&P Note (Signed)
History and Physical Interval Note:  05/04/2018 12:08 PM  Chad Gross  has presented today for surgery, with the diagnosis of RIGHT KNEE MEDIAL MENISCAL TEAR AND CHONDROMALACIA  The various methods of treatment have been discussed with the patient and family. After consideration of risks, benefits and other options for treatment, the patient has consented to  Procedure(s): ARTHROSCOPY KNEE (Right) as a surgical intervention .  The patient's history has been reviewed, patient examined, no change in status, stable for surgery.  I have reviewed the patient's chart and labs.  Questions were answered to the patient's satisfaction.     Anet Logsdon G

## 2018-05-04 NOTE — Op Note (Signed)
NAME: Chad Gross, KNOTH MEDICAL RECORD LZ:[id number] ACCOUNT [id number] DATE OF BIRTH:03-05-1946 FACILITY: [facility] LOCATION: [place], [place]  OPERATIVE REPORT  DATE OF PROCEDURE:  05/04/2018  PREOPERATIVE DIAGNOSES: 1.  Right knee torn medial meniscus. 2.  Right knee chondromalacia patella.  POSTOPERATIVE DIAGNOSES:   1.  Right knee torn medial meniscus. 2.  Right knee chondromalacia patella.  PROCEDURE: 1.  Right knee partial medial meniscectomy. 2.  Right knee abrasion chondroplasty of patellofemoral.  ANESTHESIA:  General.  ATTENDING SURGEON:  Melrose Nakayama, MD.  ASSISTANT:  Loni Dolly, PA.  INDICATIONS:  The patient is a 72 year old man with a couple of months of intense right knee pain.  This has persisted despite aspiration and injection and some other conservative measures.  By MRI scan, he has a significant posterior horn medial  meniscus tear with some mild degenerative change.  He is offered an arthroscopy.  Informed operative consent was obtained after discussion of possible complications including reaction to anesthesia and infection.  SUMMARY OF FINDINGS:  Under general anesthesia an arthroscopy of the right knee was performed.  Suprapatellar pouch was benign with the patellofemoral joint exhibited some focal breakdown on the undersurface of the patella.  A chondroplasty was done  along with abrasion of bleeding bone and some small areas.  In the medial compartment, he had a degenerative tear of the posterior horn of the medial meniscus with a displaceable flap.  I performed about a 10% partial medial meniscectomy using basket and  shaver, taking this back to a stable rim.  He had minimal degenerative change in this compartment.  The ACL looked normal and the lateral meniscus was benign.  He was discharged home same day.  DESCRIPTION OF PROCEDURE:  The patient was taken to the operating suite where general anesthetic was applied without  difficulty.  He was positioned supine and prepped and draped in normal sterile fashion.  After the administration of preop IV Kefzol and  an appropriate time-out, and arthroscopy of the right knee was performed through a total of 2 portals.  Findings were as noted above and procedure consisted predominantly of the partial medial meniscectomy, which was done with various baskets and  shavers, taking this back to stable tissue.  We also performed a chondroplasty and abrasion as described above.  The knee was thoroughly irrigated at the end of the case, followed by placement of Marcaine with epinephrine and morphine.  Adaptic was  placed over the portals followed by dry gauze and a loose Ace wrap.  ESTIMATED BLOOD LOSS AND FLUIDS:  Obtained from anesthesia records.  DISPOSITION:  The patient was taken to recovery room in stable condition.  He was scheduled to go home same day and follow up in my office in less than a week.  I will contact him by phone tonight.  TN/NUANCE  D:05/04/2018 T:05/04/2018 JOB:[id number]

## 2018-05-05 ENCOUNTER — Encounter (HOSPITAL_COMMUNITY): Payer: Self-pay | Admitting: Orthopaedic Surgery

## 2018-05-17 ENCOUNTER — Inpatient Hospital Stay: Payer: MEDICARE | Attending: Oncology

## 2018-05-17 DIAGNOSIS — Z7901 Long term (current) use of anticoagulants: Secondary | ICD-10-CM | POA: Diagnosis not present

## 2018-05-17 DIAGNOSIS — I82409 Acute embolism and thrombosis of unspecified deep veins of unspecified lower extremity: Secondary | ICD-10-CM

## 2018-05-17 DIAGNOSIS — D6859 Other primary thrombophilia: Secondary | ICD-10-CM | POA: Diagnosis not present

## 2018-05-17 LAB — PROTIME-INR
INR: 2.09
Prothrombin Time: 23.3 seconds — ABNORMAL HIGH (ref 11.4–15.2)

## 2018-05-19 ENCOUNTER — Telehealth: Payer: Self-pay

## 2018-05-19 DIAGNOSIS — Z7901 Long term (current) use of anticoagulants: Secondary | ICD-10-CM

## 2018-05-19 DIAGNOSIS — C9110 Chronic lymphocytic leukemia of B-cell type not having achieved remission: Secondary | ICD-10-CM

## 2018-05-19 NOTE — Telephone Encounter (Addendum)
Pt voiced understanding of message below ----- Message from Chad Pier, MD sent at 05/17/2018  3:00 PM EDT ----- Please call patient, PT/INR looks good, continue Coumadin at the same dose, repeat PT/INR in 1 month

## 2018-06-02 ENCOUNTER — Other Ambulatory Visit: Payer: Self-pay | Admitting: Oncology

## 2018-06-02 DIAGNOSIS — I82409 Acute embolism and thrombosis of unspecified deep veins of unspecified lower extremity: Secondary | ICD-10-CM

## 2018-06-05 ENCOUNTER — Other Ambulatory Visit: Payer: Self-pay | Admitting: Oncology

## 2018-06-05 DIAGNOSIS — I82409 Acute embolism and thrombosis of unspecified deep veins of unspecified lower extremity: Secondary | ICD-10-CM

## 2018-06-07 MED ORDER — WARFARIN SODIUM 2.5 MG PO TABS: 2.5000 mg | ORAL_TABLET | ORAL | 0 refills | Status: DC

## 2018-06-14 ENCOUNTER — Telehealth: Payer: Self-pay

## 2018-06-14 ENCOUNTER — Inpatient Hospital Stay: Payer: MEDICARE | Attending: Oncology

## 2018-06-14 DIAGNOSIS — C9110 Chronic lymphocytic leukemia of B-cell type not having achieved remission: Secondary | ICD-10-CM

## 2018-06-14 DIAGNOSIS — D6859 Other primary thrombophilia: Secondary | ICD-10-CM | POA: Insufficient documentation

## 2018-06-14 DIAGNOSIS — I82409 Acute embolism and thrombosis of unspecified deep veins of unspecified lower extremity: Secondary | ICD-10-CM

## 2018-06-14 LAB — CBC WITH DIFFERENTIAL (CANCER CENTER ONLY)
Basophils Absolute: 0.1 10*3/uL (ref 0.0–0.1)
Basophils Relative: 1 %
Eosinophils Absolute: 0.3 10*3/uL (ref 0.0–0.5)
Eosinophils Relative: 1 %
HCT: 41.6 % (ref 38.4–49.9)
Hemoglobin: 13.9 g/dL (ref 13.0–17.1)
Lymphocytes Relative: 77 %
Lymphs Abs: 18.1 10*3/uL — ABNORMAL HIGH (ref 0.9–3.3)
MCH: 31.2 pg (ref 27.2–33.4)
MCHC: 33.4 g/dL (ref 32.0–36.0)
MCV: 93.4 fL (ref 79.3–98.0)
Monocytes Absolute: 0.7 10*3/uL (ref 0.1–0.9)
Monocytes Relative: 3 %
Neutro Abs: 4.3 10*3/uL (ref 1.5–6.5)
Neutrophils Relative %: 18 %
Platelet Count: 98 10*3/uL — ABNORMAL LOW (ref 140–400)
RBC: 4.45 MIL/uL (ref 4.20–5.82)
RDW: 13.3 % (ref 11.0–14.6)
WBC Count: 23.4 10*3/uL — ABNORMAL HIGH (ref 4.0–10.3)

## 2018-06-14 LAB — PROTIME-INR
INR: 3.43
Prothrombin Time: 34.3 seconds — ABNORMAL HIGH (ref 11.4–15.2)

## 2018-06-14 NOTE — Telephone Encounter (Signed)
Patient requested date change due to he will be out of town. Per 7/17 walk in (move 21st to 22nd at 3 Pm)

## 2018-06-16 ENCOUNTER — Telehealth: Payer: Self-pay

## 2018-06-16 ENCOUNTER — Telehealth: Payer: Self-pay | Admitting: Oncology

## 2018-06-16 DIAGNOSIS — Z7901 Long term (current) use of anticoagulants: Secondary | ICD-10-CM

## 2018-06-16 NOTE — Telephone Encounter (Signed)
Scheduled appt per 7/19 sch message - pt aware of appt - my chart active

## 2018-06-16 NOTE — Telephone Encounter (Addendum)
Pt voiced understanding-   ---- Message from Ladell Pier, MD sent at 06/14/2018  4:25 PM EDT ----- Please call patient, PT INR slightly above goal range, continue coumadin same dose, repeat PT INR 1 week

## 2018-06-21 ENCOUNTER — Inpatient Hospital Stay: Payer: MEDICARE

## 2018-06-21 ENCOUNTER — Telehealth: Payer: Self-pay | Admitting: Emergency Medicine

## 2018-06-21 DIAGNOSIS — Z7901 Long term (current) use of anticoagulants: Secondary | ICD-10-CM

## 2018-06-21 DIAGNOSIS — C9110 Chronic lymphocytic leukemia of B-cell type not having achieved remission: Secondary | ICD-10-CM | POA: Diagnosis not present

## 2018-06-21 LAB — PROTIME-INR
INR: 2.92
Prothrombin Time: 30.2 seconds — ABNORMAL HIGH (ref 11.4–15.2)

## 2018-06-21 NOTE — Telephone Encounter (Addendum)
Pt verbalized understanding   ----- Message from Ladell Pier, MD sent at 06/21/2018  3:17 PM EDT ----- Please call patient, same coumadin, f/u PT INR and cbc 1 months if not scheduled sooner

## 2018-06-30 ENCOUNTER — Encounter: Payer: Self-pay | Admitting: Oncology

## 2018-07-03 ENCOUNTER — Other Ambulatory Visit: Payer: Self-pay

## 2018-07-03 DIAGNOSIS — I82409 Acute embolism and thrombosis of unspecified deep veins of unspecified lower extremity: Secondary | ICD-10-CM

## 2018-07-03 MED ORDER — WARFARIN SODIUM 2.5 MG PO TABS: 2.5000 mg | ORAL_TABLET | ORAL | 0 refills | Status: DC

## 2018-07-12 ENCOUNTER — Encounter: Payer: Self-pay | Admitting: Oncology

## 2018-07-13 NOTE — Telephone Encounter (Signed)
Please call him, I recommend a flu shot, it is okay to get it here, we will not have the flu shot until sometime in September

## 2018-07-19 ENCOUNTER — Other Ambulatory Visit: Payer: PRIVATE HEALTH INSURANCE

## 2018-07-20 ENCOUNTER — Inpatient Hospital Stay: Payer: MEDICARE | Attending: Oncology

## 2018-07-20 DIAGNOSIS — Z86718 Personal history of other venous thrombosis and embolism: Secondary | ICD-10-CM | POA: Insufficient documentation

## 2018-07-20 DIAGNOSIS — Z7901 Long term (current) use of anticoagulants: Secondary | ICD-10-CM | POA: Diagnosis present

## 2018-07-20 LAB — PROTIME-INR
INR: 2.34
Prothrombin Time: 25.5 seconds — ABNORMAL HIGH (ref 11.4–15.2)

## 2018-07-23 ENCOUNTER — Encounter: Payer: Self-pay | Admitting: Oncology

## 2018-08-09 ENCOUNTER — Other Ambulatory Visit: Payer: Self-pay | Admitting: Internal Medicine

## 2018-08-09 NOTE — Telephone Encounter (Signed)
Per pharmacy, med is on backorder.

## 2018-08-11 ENCOUNTER — Encounter: Payer: Self-pay | Admitting: Internal Medicine

## 2018-08-11 ENCOUNTER — Encounter: Payer: Self-pay | Admitting: Oncology

## 2018-08-11 ENCOUNTER — Other Ambulatory Visit: Payer: Self-pay | Admitting: Internal Medicine

## 2018-08-11 MED ORDER — LOSARTAN POTASSIUM 25 MG PO TABS: 25.0000 mg | ORAL_TABLET | Freq: Two times a day (BID) | ORAL | 5 refills | Status: DC

## 2018-08-11 NOTE — Telephone Encounter (Signed)
Done erx 

## 2018-08-15 ENCOUNTER — Other Ambulatory Visit: Payer: PRIVATE HEALTH INSURANCE

## 2018-08-15 ENCOUNTER — Inpatient Hospital Stay: Payer: MEDICARE | Attending: Oncology | Admitting: Nurse Practitioner

## 2018-08-15 ENCOUNTER — Inpatient Hospital Stay: Payer: MEDICARE

## 2018-08-15 ENCOUNTER — Ambulatory Visit: Payer: PRIVATE HEALTH INSURANCE | Admitting: Oncology

## 2018-08-15 ENCOUNTER — Encounter: Payer: Self-pay | Admitting: Nurse Practitioner

## 2018-08-15 VITALS — BP 113/67 | HR 83 | Temp 98.7°F | Resp 18 | Ht 71.0 in | Wt 221.4 lb

## 2018-08-15 DIAGNOSIS — Z86718 Personal history of other venous thrombosis and embolism: Secondary | ICD-10-CM

## 2018-08-15 DIAGNOSIS — C9110 Chronic lymphocytic leukemia of B-cell type not having achieved remission: Secondary | ICD-10-CM | POA: Diagnosis present

## 2018-08-15 DIAGNOSIS — D696 Thrombocytopenia, unspecified: Secondary | ICD-10-CM | POA: Insufficient documentation

## 2018-08-15 DIAGNOSIS — D6859 Other primary thrombophilia: Secondary | ICD-10-CM | POA: Insufficient documentation

## 2018-08-15 DIAGNOSIS — Z7901 Long term (current) use of anticoagulants: Secondary | ICD-10-CM

## 2018-08-15 DIAGNOSIS — Z23 Encounter for immunization: Secondary | ICD-10-CM | POA: Insufficient documentation

## 2018-08-15 DIAGNOSIS — I82409 Acute embolism and thrombosis of unspecified deep veins of unspecified lower extremity: Secondary | ICD-10-CM

## 2018-08-15 LAB — CBC WITH DIFFERENTIAL (CANCER CENTER ONLY)
Basophils Absolute: 0.1 10*3/uL (ref 0.0–0.1)
Basophils Relative: 1 %
Eosinophils Absolute: 0.2 10*3/uL (ref 0.0–0.5)
Eosinophils Relative: 1 %
HCT: 43.1 % (ref 38.4–49.9)
Hemoglobin: 14.5 g/dL (ref 13.0–17.1)
Lymphocytes Relative: 76 %
Lymphs Abs: 15.6 10*3/uL — ABNORMAL HIGH (ref 0.9–3.3)
MCH: 31.4 pg (ref 27.2–33.4)
MCHC: 33.6 g/dL (ref 32.0–36.0)
MCV: 93.4 fL (ref 79.3–98.0)
Monocytes Absolute: 0.5 10*3/uL (ref 0.1–0.9)
Monocytes Relative: 2 %
Neutro Abs: 4.1 10*3/uL (ref 1.5–6.5)
Neutrophils Relative %: 20 %
Platelet Count: 95 10*3/uL — ABNORMAL LOW (ref 140–400)
RBC: 4.61 MIL/uL (ref 4.20–5.82)
RDW: 13.2 % (ref 11.0–14.6)
WBC Count: 20.5 10*3/uL — ABNORMAL HIGH (ref 4.0–10.3)

## 2018-08-15 LAB — PROTIME-INR
INR: 2.23
Prothrombin Time: 24.5 seconds — ABNORMAL HIGH (ref 11.4–15.2)

## 2018-08-15 MED ORDER — INFLUENZA VAC SPLIT HIGH-DOSE 0.5 ML IM SUSY
0.5000 mL | PREFILLED_SYRINGE | INTRAMUSCULAR | Status: AC
  Administered 2018-08-15: 0.5 mL via INTRAMUSCULAR
  Filled 2018-08-15: qty 0.5

## 2018-08-15 MED ORDER — INFLUENZA VAC SPLIT QUAD 0.5 ML IM SUSY
PREFILLED_SYRINGE | INTRAMUSCULAR | Status: AC
  Filled 2018-08-15: qty 0.5

## 2018-08-15 MED ORDER — INFLUENZA VAC SPLIT QUAD 0.5 ML IM SUSY: 0.5000 mL | PREFILLED_SYRINGE | Freq: Once | INTRAMUSCULAR | Status: DC

## 2018-08-15 NOTE — Progress Notes (Addendum)
  [place] OFFICE PROGRESS NOTE   Diagnosis: CLL, hypercoagulation syndrome  INTERVAL HISTORY:   [name] returns as scheduled.  He overall feels well.  No fevers or sweats.  No interim illnesses or infections.  He has persistent edema involving the right leg.  He notes that this increases after a long car ride.  He continues Coumadin, current dose 12.5 milligrams on Mondays Wednesdays and Fridays, 10 mg all other days.  He denies bleeding.  Objective:  Vital signs in last 24 hours:  Blood pressure 113/67, pulse 83, temperature 98.7 F (37.1 C), temperature source Oral, resp. rate 18, height 5\' 11"  (1.803 m), weight 221 lb 6.4 oz (100.4 kg), SpO2 94 %.    HEENT: Neck without mass. Lymphatics: 1 cm submandibular and submental nodes.  1 to 2 cm low left posterior cervical/scalene node.  1 to 2 cm right axillary lymph nodes.  Question small right inguinal lymph node. Resp: Lungs clear bilaterally. Cardio: Regular rate and rhythm. GI: Abdomen soft and nontender.  No hepatosplenomegaly. Vascular: Chronic stasis change at the legs bilaterally.  Bilateral varicosities.  Right lower leg is larger than the left lower leg.   Lab Results:  Lab Results  Component Value Date   WBC 20.5 (H) 08/15/2018   HGB 14.5 08/15/2018   HCT 43.1 08/15/2018   MCV 93.4 08/15/2018   PLT 95 (L) 08/15/2018   NEUTROABS 4.1 08/15/2018    Imaging:  No results found.  Medications: I have reviewed the patient's current medications.  Assessment/Plan: 1. 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 He will return for a monthly PT check.  2. Indwelling IVC catheter.  3. 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  4. 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 erectile dysfunction-followed by Dr. Jeffie Pollock , status post placement of a penile implant at [facility] in April  2014  7. CT of the abdomen/pelvis at [facility] urology February 2014 for evaluation of hematuria-12 mm external iliac nodes and leftperiaortic retroperitoneal lymph node-11 mm-potentially related to CLL 8.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, peripheral lymphocytosis and small palpable lymph nodes  Disposition: [name] appears stable.  He remains asymptomatic from the CLL.  We reviewed the CBC from today and counts are stable.  He understands there is no indication for treating the CLL at present.  We again discussed that he is at increased risk for infections related to the CLL.  He will receive the influenza vaccine today.  When he returns for his next visit in December he will receive the pneumococcal 23 vaccine.  He has a history of recurrent venous thromboembolism.  He is maintained on indefinite Coumadin.  The PT/INR is in therapeutic range.  He will continue Coumadin at the current dose.  Plan to continue monthly PT/INR checks.  He will return for a follow-up visit as scheduled in December of this year.  He will contact the office in the interim with any problems.  Patient seen with Dr. Benay Spice.    [name] ANP/GNP-BC   08/15/2018  3:13 PM  This was a shared visit with [name].  There is no indication for treating the CLL at present.  The PT/INR is therapeutic.  He received an influenza vaccine today.  Julieanne Manson, MD

## 2018-08-16 ENCOUNTER — Other Ambulatory Visit: Payer: PRIVATE HEALTH INSURANCE

## 2018-09-03 ENCOUNTER — Encounter: Payer: Self-pay | Admitting: Oncology

## 2018-09-04 ENCOUNTER — Telehealth: Payer: Self-pay | Admitting: *Deleted

## 2018-09-04 DIAGNOSIS — I82409 Acute embolism and thrombosis of unspecified deep veins of unspecified lower extremity: Secondary | ICD-10-CM

## 2018-09-04 MED ORDER — WARFARIN SODIUM 2.5 MG PO TABS: 2.5000 mg | ORAL_TABLET | ORAL | 3 refills | Status: DC

## 2018-09-04 MED ORDER — WARFARIN SODIUM 2.5 MG PO TABS: 2.5000 mg | ORAL_TABLET | ORAL | 0 refills | Status: DC

## 2018-09-04 NOTE — Telephone Encounter (Signed)
Notified of refill. 

## 2018-09-05 ENCOUNTER — Encounter: Payer: Self-pay | Admitting: Oncology

## 2018-09-19 ENCOUNTER — Encounter: Payer: Self-pay | Admitting: Oncology

## 2018-09-20 ENCOUNTER — Inpatient Hospital Stay: Payer: MEDICARE | Attending: Oncology

## 2018-09-20 DIAGNOSIS — Z86718 Personal history of other venous thrombosis and embolism: Secondary | ICD-10-CM | POA: Diagnosis not present

## 2018-09-20 DIAGNOSIS — D696 Thrombocytopenia, unspecified: Secondary | ICD-10-CM | POA: Insufficient documentation

## 2018-09-20 DIAGNOSIS — Z7901 Long term (current) use of anticoagulants: Secondary | ICD-10-CM | POA: Diagnosis not present

## 2018-09-20 DIAGNOSIS — C9110 Chronic lymphocytic leukemia of B-cell type not having achieved remission: Secondary | ICD-10-CM | POA: Insufficient documentation

## 2018-09-20 DIAGNOSIS — Z23 Encounter for immunization: Secondary | ICD-10-CM | POA: Diagnosis not present

## 2018-09-20 DIAGNOSIS — D6859 Other primary thrombophilia: Secondary | ICD-10-CM | POA: Insufficient documentation

## 2018-09-20 DIAGNOSIS — I82409 Acute embolism and thrombosis of unspecified deep veins of unspecified lower extremity: Secondary | ICD-10-CM

## 2018-09-20 LAB — PROTIME-INR
INR: 1.69
Prothrombin Time: 19.6 seconds — ABNORMAL HIGH (ref 11.4–15.2)

## 2018-09-20 NOTE — Progress Notes (Signed)
Left message for return call.

## 2018-09-22 ENCOUNTER — Other Ambulatory Visit: Payer: Self-pay | Admitting: *Deleted

## 2018-09-22 DIAGNOSIS — Z7901 Long term (current) use of anticoagulants: Secondary | ICD-10-CM

## 2018-10-04 ENCOUNTER — Telehealth: Payer: Self-pay

## 2018-10-04 NOTE — Telephone Encounter (Signed)
Per patient call and requested labs to be moved to [place]. Per 11/6 voice mail return call

## 2018-10-05 ENCOUNTER — Other Ambulatory Visit: Payer: MEDICARE

## 2018-10-06 ENCOUNTER — Inpatient Hospital Stay: Payer: MEDICARE | Attending: Oncology

## 2018-10-06 DIAGNOSIS — C9110 Chronic lymphocytic leukemia of B-cell type not having achieved remission: Secondary | ICD-10-CM | POA: Diagnosis present

## 2018-10-06 DIAGNOSIS — Z7901 Long term (current) use of anticoagulants: Secondary | ICD-10-CM

## 2018-10-06 DIAGNOSIS — D6859 Other primary thrombophilia: Secondary | ICD-10-CM | POA: Insufficient documentation

## 2018-10-06 LAB — PROTIME-INR
INR: 2.23
Prothrombin Time: 24.4 seconds — ABNORMAL HIGH (ref 11.4–15.2)

## 2018-10-16 ENCOUNTER — Other Ambulatory Visit: Payer: Self-pay | Admitting: Internal Medicine

## 2018-10-16 ENCOUNTER — Telehealth: Payer: Self-pay | Admitting: Oncology

## 2018-10-16 ENCOUNTER — Encounter: Payer: Self-pay | Admitting: Oncology

## 2018-10-16 NOTE — Telephone Encounter (Signed)
[facility] CALL DAY - MOVED LAB/FU FROM 12/17 TO 12/20 - LEFT MESSAGE FOR PATIENT. ALSO CONFIRMED 11/20 LAB. SCHEDULE MAILED.

## 2018-10-16 NOTE — Telephone Encounter (Signed)
   LOV:02/07/18 NextOV:02/16/19 Last Filled/Quantity:09/12/18 30#

## 2018-10-16 NOTE — Telephone Encounter (Signed)
Done erx 

## 2018-10-18 ENCOUNTER — Other Ambulatory Visit: Payer: PRIVATE HEALTH INSURANCE

## 2018-10-18 ENCOUNTER — Encounter: Payer: Self-pay | Admitting: Oncology

## 2018-10-19 ENCOUNTER — Encounter: Payer: Self-pay | Admitting: Internal Medicine

## 2018-10-20 MED ORDER — MECLIZINE HCL 12.5 MG PO TABS: 12.5000 mg | ORAL_TABLET | Freq: Three times a day (TID) | ORAL | 1 refills | Status: DC | PRN

## 2018-10-27 ENCOUNTER — Other Ambulatory Visit: Payer: Self-pay | Admitting: *Deleted

## 2018-11-14 ENCOUNTER — Ambulatory Visit: Payer: PRIVATE HEALTH INSURANCE | Admitting: Oncology

## 2018-11-14 ENCOUNTER — Other Ambulatory Visit: Payer: PRIVATE HEALTH INSURANCE

## 2018-11-16 ENCOUNTER — Other Ambulatory Visit: Payer: Self-pay | Admitting: *Deleted

## 2018-11-16 DIAGNOSIS — C9110 Chronic lymphocytic leukemia of B-cell type not having achieved remission: Secondary | ICD-10-CM

## 2018-11-16 DIAGNOSIS — Z7901 Long term (current) use of anticoagulants: Secondary | ICD-10-CM

## 2018-11-17 ENCOUNTER — Telehealth: Payer: Self-pay

## 2018-11-17 ENCOUNTER — Inpatient Hospital Stay: Payer: MEDICARE | Attending: Oncology

## 2018-11-17 ENCOUNTER — Inpatient Hospital Stay (HOSPITAL_BASED_OUTPATIENT_CLINIC_OR_DEPARTMENT_OTHER): Payer: MEDICARE | Admitting: Oncology

## 2018-11-17 ENCOUNTER — Other Ambulatory Visit: Payer: Self-pay | Admitting: Oncology

## 2018-11-17 VITALS — BP 137/72 | HR 80 | Temp 98.1°F | Resp 18 | Ht 71.0 in | Wt 218.0 lb

## 2018-11-17 DIAGNOSIS — Z23 Encounter for immunization: Secondary | ICD-10-CM

## 2018-11-17 DIAGNOSIS — C9110 Chronic lymphocytic leukemia of B-cell type not having achieved remission: Secondary | ICD-10-CM | POA: Diagnosis not present

## 2018-11-17 DIAGNOSIS — Z7901 Long term (current) use of anticoagulants: Secondary | ICD-10-CM | POA: Diagnosis not present

## 2018-11-17 DIAGNOSIS — N529 Male erectile dysfunction, unspecified: Secondary | ICD-10-CM

## 2018-11-17 LAB — CBC WITH DIFFERENTIAL (CANCER CENTER ONLY)
Abs Immature Granulocytes: 0.09 10*3/uL — ABNORMAL HIGH (ref 0.00–0.07)
Basophils Absolute: 0 10*3/uL (ref 0.0–0.1)
Basophils Relative: 0 %
Eosinophils Absolute: 0.3 10*3/uL (ref 0.0–0.5)
Eosinophils Relative: 1 %
HCT: 45.7 % (ref 39.0–52.0)
Hemoglobin: 15 g/dL (ref 13.0–17.0)
Immature Granulocytes: 0 %
Lymphocytes Relative: 76 %
Lymphs Abs: 19.5 10*3/uL — ABNORMAL HIGH (ref 0.7–4.0)
MCH: 31.4 pg (ref 26.0–34.0)
MCHC: 32.8 g/dL (ref 30.0–36.0)
MCV: 95.8 fL (ref 80.0–100.0)
Monocytes Absolute: 0.8 10*3/uL (ref 0.1–1.0)
Monocytes Relative: 3 %
Neutro Abs: 5.1 10*3/uL (ref 1.7–7.7)
Neutrophils Relative %: 20 %
Platelet Count: 91 10*3/uL — ABNORMAL LOW (ref 150–400)
RBC: 4.77 MIL/uL (ref 4.22–5.81)
RDW: 12.4 % (ref 11.5–15.5)
WBC Count: 25.7 10*3/uL — ABNORMAL HIGH (ref 4.0–10.5)
nRBC: 0 % (ref 0.0–0.2)

## 2018-11-17 LAB — PROTIME-INR
INR: 2.31
Prothrombin Time: 25.1 seconds — ABNORMAL HIGH (ref 11.4–15.2)

## 2018-11-17 MED ORDER — PNEUMOCOCCAL VAC POLYVALENT 25 MCG/0.5ML IJ INJ: 0.5000 mL | INJECTION | Freq: Once | INTRAMUSCULAR | Status: DC

## 2018-11-17 NOTE — Progress Notes (Signed)
  [place] OFFICE PROGRESS NOTE   Diagnosis: CLL, anticoagulation therapy  INTERVAL HISTORY:   Mr. Yordy returns for a scheduled visit.  He continues Coumadin anticoagulation.  No bleeding or symptom of thrombosis.  No fever or night sweats.  Good appetite.  He currently has a "cold "with a cough and sinus congestion. He developed positional vertigo 09/29/2018.  This continues to occur intermittently.  He has a history of this in the remote past.  Objective:  Vital signs in last 24 hours:  Blood pressure 137/72, pulse 80, temperature 98.1 F (36.7 C), temperature source Oral, resp. rate 18, height 5\' 11"  (1.803 m), weight 218 lb (98.9 kg), SpO2 97 %.    HEENT: Neck without mass Lymphatics: 1/2-1 cm bilateral low posterior cervical/scalene, left submandibular, submental, and right greater than left axillary nodes Resp: Lungs clear bilaterally in the anterior and posterior lung fields, no respiratory distress Cardio: Regular rate and rhythm GI: No hepatosplenomegaly Vascular: No leg edema  Lab Results:  Lab Results  Component Value Date   WBC 25.7 (H) 11/17/2018   HGB 15.0 11/17/2018   HCT 45.7 11/17/2018   MCV 95.8 11/17/2018   PLT 91 (L) 11/17/2018   NEUTROABS 5.1 11/17/2018  Absolute lymphocyte count 19.5    Lab Results  Component Value Date   INR 2.31 11/17/2018     Medications: I have reviewed the patient's current medications.   Assessment/Plan: 1. 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 He will return for a monthly PT check.  2. Indwelling IVC catheter.  3. 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  4. 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 erectile dysfunction-followed by Dr. Jeffie Pollock , status post placement of a penile implant at [facility] in April 2014  7. CT of the abdomen/pelvis at [facility] urology  February 2014 for evaluation of hematuria-12 mm external iliac nodes and leftperiaortic retroperitoneal lymph node-11 mm-potentially related to CLL 8.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, peripheral lymphocytosis and small palpable lymph nodes   Disposition: He appears stable.  There is no indication for treating the CLL.  The PT/INR is therapeutic.  He will continue Coumadin at the current dose.  He will contact us for spontaneous bleeding or bruising.  He has an upper respiratory infection, likely viral.  He will call for a fever or dyspnea.  He will return for a lab visit and 23 valent pneumococcal vaccine in 1 month.  Mr. Cowin will be scheduled for an office visit in 4 months.  Betsy Coder, MD  11/17/2018  11:43 AM

## 2018-11-17 NOTE — Telephone Encounter (Signed)
PRINTED AVS AND CALENDER OF UPCOMING APPOINTMENT. PER12/20 LOS

## 2018-11-17 NOTE — Addendum Note (Signed)
Addended by: Reyne Dumas E on: 11/17/2018 12:59 PM   Modules accepted: Orders

## 2018-11-20 ENCOUNTER — Other Ambulatory Visit: Payer: Self-pay | Admitting: Internal Medicine

## 2018-12-13 ENCOUNTER — Telehealth: Payer: Self-pay | Admitting: *Deleted

## 2018-12-13 ENCOUNTER — Inpatient Hospital Stay: Payer: MEDICARE | Attending: Oncology

## 2018-12-13 ENCOUNTER — Inpatient Hospital Stay: Payer: MEDICARE

## 2018-12-13 DIAGNOSIS — Z23 Encounter for immunization: Secondary | ICD-10-CM | POA: Insufficient documentation

## 2018-12-13 DIAGNOSIS — C9110 Chronic lymphocytic leukemia of B-cell type not having achieved remission: Secondary | ICD-10-CM

## 2018-12-13 DIAGNOSIS — Z79899 Other long term (current) drug therapy: Secondary | ICD-10-CM | POA: Diagnosis not present

## 2018-12-13 DIAGNOSIS — Z7901 Long term (current) use of anticoagulants: Secondary | ICD-10-CM

## 2018-12-13 LAB — CBC WITH DIFFERENTIAL (CANCER CENTER ONLY)
Abs Immature Granulocytes: 0.1 10*3/uL — ABNORMAL HIGH (ref 0.00–0.07)
Basophils Absolute: 0.1 10*3/uL (ref 0.0–0.1)
Basophils Relative: 0 %
Eosinophils Absolute: 0.3 10*3/uL (ref 0.0–0.5)
Eosinophils Relative: 1 %
HCT: 43 % (ref 39.0–52.0)
Hemoglobin: 14.5 g/dL (ref 13.0–17.0)
Immature Granulocytes: 0 %
Lymphocytes Relative: 81 %
Lymphs Abs: 21.8 10*3/uL — ABNORMAL HIGH (ref 0.7–4.0)
MCH: 31.7 pg (ref 26.0–34.0)
MCHC: 33.7 g/dL (ref 30.0–36.0)
MCV: 93.9 fL (ref 80.0–100.0)
Monocytes Absolute: 1.7 10*3/uL — ABNORMAL HIGH (ref 0.1–1.0)
Monocytes Relative: 6 %
Neutro Abs: 3.2 10*3/uL (ref 1.7–7.7)
Neutrophils Relative %: 12 %
Platelet Count: 80 10*3/uL — ABNORMAL LOW (ref 150–400)
RBC: 4.58 MIL/uL (ref 4.22–5.81)
RDW: 12.5 % (ref 11.5–15.5)
WBC Count: 27.2 10*3/uL — ABNORMAL HIGH (ref 4.0–10.5)
nRBC: 0 % (ref 0.0–0.2)

## 2018-12-13 LAB — PROTIME-INR
INR: 2.5
Prothrombin Time: 26.7 seconds — ABNORMAL HIGH (ref 11.4–15.2)

## 2018-12-13 MED ORDER — PNEUMOCOCCAL VAC POLYVALENT 25 MCG/0.5ML IJ INJ
0.5000 mL | INJECTION | Freq: Once | INTRAMUSCULAR | Status: AC
  Administered 2018-12-13: 0.5 mL via INTRAMUSCULAR
  Filled 2018-12-13: qty 0.5

## 2018-12-13 NOTE — Telephone Encounter (Signed)
-----   Message from Ladell Pier, MD sent at 12/13/2018  4:22 PM EST ----- Please call patient, same coumadin, repeat PT INR and cbc 1 month

## 2018-12-13 NOTE — Telephone Encounter (Signed)
Notified patient of INR/CBC results. Continue same coumadin at 10 mg daily, except 12.5 mg MWF. He knows to call for any fever or bleeding.

## 2018-12-20 ENCOUNTER — Ambulatory Visit (INDEPENDENT_AMBULATORY_CARE_PROVIDER_SITE_OTHER): Payer: MEDICARE | Admitting: Podiatry

## 2018-12-20 ENCOUNTER — Encounter: Payer: Self-pay | Admitting: Podiatry

## 2018-12-20 ENCOUNTER — Ambulatory Visit (INDEPENDENT_AMBULATORY_CARE_PROVIDER_SITE_OTHER): Payer: MEDICARE

## 2018-12-20 ENCOUNTER — Other Ambulatory Visit: Payer: Self-pay | Admitting: Podiatry

## 2018-12-20 VITALS — BP 173/85 | HR 75

## 2018-12-20 DIAGNOSIS — M779 Enthesopathy, unspecified: Secondary | ICD-10-CM

## 2018-12-20 DIAGNOSIS — M79671 Pain in right foot: Secondary | ICD-10-CM

## 2018-12-20 DIAGNOSIS — M7751 Other enthesopathy of right foot: Secondary | ICD-10-CM | POA: Diagnosis not present

## 2018-12-20 MED ORDER — TRIAMCINOLONE ACETONIDE 10 MG/ML IJ SUSP
10.0000 mg | Freq: Once | INTRAMUSCULAR | Status: AC
  Administered 2018-12-20: 10 mg

## 2018-12-20 NOTE — Progress Notes (Signed)
Subjective:   Patient ID: Chad Gross, male   DOB: 73 y.o.   MRN: [id number]   HPI Patient presents stating he has had a number of year history of pain in the outside of his right foot and its been off and on and is worse when he walks.  Patient states that so far has not been able to get rid of it and currently patient does not smoke and would like to be more active   Review of Systems  All other systems reviewed and are negative.       Objective:  Physical Exam Vitals signs and nursing note reviewed.  Constitutional:      Appearance: He is well-developed.  Pulmonary:     Effort: Pulmonary effort is normal.  Musculoskeletal: Normal range of motion.  Skin:    General: Skin is warm.  Neurological:     Mental Status: He is alert.     Neurovascular status found to be intact muscle strength is adequate range of motion within normal limits with patient found to have inflammation pain around the peroneal insertion base of fifth metatarsal and distal to this point with swelling noted and no indication of tendon dysfunction or muscle strength loss.  Patient has good digital perfusion well oriented x3     Assessment:  Acute peroneal tendinitis right with inflammation fluid at insertion with chronic nature also to condition     Plan:  H&P condition reviewed and careful sheath injection administered 3 mg Kenalog 5 mg Xylocaine and then applied a fascial brace to lift up the lateral side of the foot.  Gave instructions for ice therapy anti-inflammatory supportive shoe and may consider orthotics immobilization or other treatments depending on response to treatment  X-ray indicates mild reactivity around the base of the fifth metatarsal right but no indications of stress fracture arthritis

## 2018-12-20 NOTE — Progress Notes (Signed)
DG  °

## 2019-01-08 ENCOUNTER — Telehealth: Payer: Self-pay | Admitting: Podiatry

## 2019-01-08 NOTE — Telephone Encounter (Signed)
I'm a pt of Dr. Mellody Drown and he told me to call when I'm having pain to get squeezed in. If you could call me back at [phone] to see him in the next day or two. Thank you.

## 2019-01-10 ENCOUNTER — Inpatient Hospital Stay: Payer: MEDICARE | Attending: Oncology

## 2019-01-10 DIAGNOSIS — C9110 Chronic lymphocytic leukemia of B-cell type not having achieved remission: Secondary | ICD-10-CM

## 2019-01-10 DIAGNOSIS — Z7901 Long term (current) use of anticoagulants: Secondary | ICD-10-CM | POA: Insufficient documentation

## 2019-01-10 LAB — CBC WITH DIFFERENTIAL (CANCER CENTER ONLY)
Abs Immature Granulocytes: 0.11 10*3/uL — ABNORMAL HIGH (ref 0.00–0.07)
Basophils Absolute: 0.1 10*3/uL (ref 0.0–0.1)
Basophils Relative: 0 %
Eosinophils Absolute: 0.2 10*3/uL (ref 0.0–0.5)
Eosinophils Relative: 1 %
HCT: 43.7 % (ref 39.0–52.0)
Hemoglobin: 14.5 g/dL (ref 13.0–17.0)
Immature Granulocytes: 0 %
Lymphocytes Relative: 85 %
Lymphs Abs: 30.2 10*3/uL — ABNORMAL HIGH (ref 0.7–4.0)
MCH: 31.5 pg (ref 26.0–34.0)
MCHC: 33.2 g/dL (ref 30.0–36.0)
MCV: 94.8 fL (ref 80.0–100.0)
Monocytes Absolute: 1.1 10*3/uL — ABNORMAL HIGH (ref 0.1–1.0)
Monocytes Relative: 3 %
Neutro Abs: 3.7 10*3/uL (ref 1.7–7.7)
Neutrophils Relative %: 11 %
Platelet Count: 81 10*3/uL — ABNORMAL LOW (ref 150–400)
RBC: 4.61 MIL/uL (ref 4.22–5.81)
RDW: 12.5 % (ref 11.5–15.5)
WBC Count: 35.5 10*3/uL — ABNORMAL HIGH (ref 4.0–10.5)
nRBC: 0.1 % (ref 0.0–0.2)

## 2019-01-10 LAB — PROTIME-INR
INR: 2.76
Prothrombin Time: 28.8 seconds — ABNORMAL HIGH (ref 11.4–15.2)

## 2019-01-11 ENCOUNTER — Telehealth: Payer: Self-pay | Admitting: *Deleted

## 2019-01-11 NOTE — Telephone Encounter (Signed)
TCT patient regarding recent CBC results. Reviewed with patient, advised that Dr. Benay Spice feels that they are stable. Pt is to continue to on the same dose of coumadin. Repeat CBC, PT/INR in 1 month. Pt voiced understanding

## 2019-01-11 NOTE — Telephone Encounter (Signed)
-----   Message from Ladell Pier, MD sent at 01/10/2019  2:57 PM EST ----- Please call patient, CBC is stable, continue Coumadin same dose, repeat CBC and PT/INR in 1 month

## 2019-01-12 ENCOUNTER — Other Ambulatory Visit: Payer: Self-pay | Admitting: Internal Medicine

## 2019-01-14 ENCOUNTER — Other Ambulatory Visit: Payer: Self-pay | Admitting: Internal Medicine

## 2019-01-16 ENCOUNTER — Other Ambulatory Visit: Payer: Self-pay | Admitting: *Deleted

## 2019-01-16 DIAGNOSIS — Z7901 Long term (current) use of anticoagulants: Secondary | ICD-10-CM

## 2019-01-16 MED ORDER — WARFARIN SODIUM 10 MG PO TABS: 10.0000 mg | ORAL_TABLET | Freq: Every day | ORAL | 3 refills | Status: DC

## 2019-01-17 ENCOUNTER — Ambulatory Visit: Payer: MEDICARE | Admitting: Podiatry

## 2019-01-22 ENCOUNTER — Encounter: Payer: Self-pay | Admitting: Podiatry

## 2019-01-22 ENCOUNTER — Ambulatory Visit (INDEPENDENT_AMBULATORY_CARE_PROVIDER_SITE_OTHER): Payer: MEDICARE | Admitting: Podiatry

## 2019-01-22 DIAGNOSIS — M7671 Peroneal tendinitis, right leg: Secondary | ICD-10-CM | POA: Diagnosis not present

## 2019-01-22 DIAGNOSIS — M779 Enthesopathy, unspecified: Secondary | ICD-10-CM | POA: Diagnosis not present

## 2019-01-25 NOTE — Progress Notes (Signed)
Subjective:   Patient ID: Chad Gross, male   DOB: 73 y.o.   MRN: [id number]   HPI Patient states the pain is improved but the brace bothers his leg.  States he is having mild discomfort but quite a bit better than previous   ROS      Objective:  Physical Exam  Neurovascular status intact muscle strength is adequate patient found to have discomfort in the peroneal tendon at the insertion base of fifth metatarsal with inflammation fluid noted that is improved with mild pain tenderness upon deep palpation     Assessment:  Improvement peroneal tendinitis right with pain still present upon deep palpation     Plan:  H&P condition reviewed and recommended continued ice therapy supportive shoe gear usage and brace as tolerated.  Patient is discharged and will be seen back as needed and explanation given concerning condition

## 2019-01-26 ENCOUNTER — Encounter: Payer: Self-pay | Admitting: Podiatry

## 2019-02-03 ENCOUNTER — Encounter: Payer: Self-pay | Admitting: Oncology

## 2019-02-09 ENCOUNTER — Encounter: Payer: Self-pay | Admitting: Oncology

## 2019-02-12 ENCOUNTER — Encounter: Payer: Self-pay | Admitting: Podiatry

## 2019-02-13 ENCOUNTER — Other Ambulatory Visit: Payer: Self-pay | Admitting: *Deleted

## 2019-02-13 DIAGNOSIS — Z7901 Long term (current) use of anticoagulants: Secondary | ICD-10-CM

## 2019-02-13 DIAGNOSIS — C9110 Chronic lymphocytic leukemia of B-cell type not having achieved remission: Secondary | ICD-10-CM

## 2019-02-14 ENCOUNTER — Encounter: Payer: Self-pay | Admitting: Podiatry

## 2019-02-14 ENCOUNTER — Inpatient Hospital Stay: Payer: MEDICARE | Attending: Oncology

## 2019-02-14 ENCOUNTER — Ambulatory Visit (INDEPENDENT_AMBULATORY_CARE_PROVIDER_SITE_OTHER): Payer: MEDICARE | Admitting: Podiatry

## 2019-02-14 ENCOUNTER — Other Ambulatory Visit: Payer: Self-pay

## 2019-02-14 DIAGNOSIS — C9110 Chronic lymphocytic leukemia of B-cell type not having achieved remission: Secondary | ICD-10-CM | POA: Insufficient documentation

## 2019-02-14 DIAGNOSIS — Z7901 Long term (current) use of anticoagulants: Secondary | ICD-10-CM

## 2019-02-14 DIAGNOSIS — M722 Plantar fascial fibromatosis: Secondary | ICD-10-CM

## 2019-02-14 DIAGNOSIS — M7671 Peroneal tendinitis, right leg: Secondary | ICD-10-CM

## 2019-02-14 LAB — CBC WITH DIFFERENTIAL (CANCER CENTER ONLY)
Abs Immature Granulocytes: 0.18 10*3/uL — ABNORMAL HIGH (ref 0.00–0.07)
Basophils Absolute: 0 10*3/uL (ref 0.0–0.1)
Basophils Relative: 0 %
Eosinophils Absolute: 0.3 10*3/uL (ref 0.0–0.5)
Eosinophils Relative: 1 %
HCT: 43.9 % (ref 39.0–52.0)
Hemoglobin: 14.3 g/dL (ref 13.0–17.0)
Immature Granulocytes: 0 %
Lymphocytes Relative: 86 %
Lymphs Abs: 38.5 10*3/uL — ABNORMAL HIGH (ref 0.7–4.0)
MCH: 31.5 pg (ref 26.0–34.0)
MCHC: 32.6 g/dL (ref 30.0–36.0)
MCV: 96.7 fL (ref 80.0–100.0)
Monocytes Absolute: 2.1 10*3/uL — ABNORMAL HIGH (ref 0.1–1.0)
Monocytes Relative: 5 %
Neutro Abs: 3.6 10*3/uL (ref 1.7–7.7)
Neutrophils Relative %: 8 %
Platelet Count: 80 10*3/uL — ABNORMAL LOW (ref 150–400)
RBC: 4.54 MIL/uL (ref 4.22–5.81)
RDW: 13.1 % (ref 11.5–15.5)
WBC Count: 44.7 10*3/uL — ABNORMAL HIGH (ref 4.0–10.5)
nRBC: 0 % (ref 0.0–0.2)

## 2019-02-14 LAB — PROTIME-INR
INR: 2.6 — ABNORMAL HIGH (ref 0.8–1.2)
Prothrombin Time: 27.6 seconds — ABNORMAL HIGH (ref 11.4–15.2)

## 2019-02-14 MED ORDER — TRIAMCINOLONE ACETONIDE 10 MG/ML IJ SUSP
10.0000 mg | Freq: Once | INTRAMUSCULAR | Status: AC
  Administered 2019-02-14: 10 mg

## 2019-02-15 ENCOUNTER — Other Ambulatory Visit: Payer: Self-pay | Admitting: Internal Medicine

## 2019-02-16 ENCOUNTER — Other Ambulatory Visit (INDEPENDENT_AMBULATORY_CARE_PROVIDER_SITE_OTHER): Payer: MEDICARE

## 2019-02-16 ENCOUNTER — Ambulatory Visit (INDEPENDENT_AMBULATORY_CARE_PROVIDER_SITE_OTHER): Payer: MEDICARE | Admitting: Internal Medicine

## 2019-02-16 ENCOUNTER — Encounter: Payer: Self-pay | Admitting: Internal Medicine

## 2019-02-16 ENCOUNTER — Other Ambulatory Visit: Payer: MEDICARE

## 2019-02-16 ENCOUNTER — Other Ambulatory Visit: Payer: Self-pay

## 2019-02-16 ENCOUNTER — Telehealth: Payer: Self-pay | Admitting: *Deleted

## 2019-02-16 VITALS — BP 118/76 | HR 76 | Temp 98.0°F | Ht 71.0 in | Wt 213.0 lb

## 2019-02-16 DIAGNOSIS — N320 Bladder-neck obstruction: Secondary | ICD-10-CM

## 2019-02-16 DIAGNOSIS — E785 Hyperlipidemia, unspecified: Secondary | ICD-10-CM

## 2019-02-16 DIAGNOSIS — I10 Essential (primary) hypertension: Secondary | ICD-10-CM

## 2019-02-16 DIAGNOSIS — H9191 Unspecified hearing loss, right ear: Secondary | ICD-10-CM | POA: Insufficient documentation

## 2019-02-16 LAB — PSA: PSA: 2.72 ng/mL (ref 0.10–4.00)

## 2019-02-16 LAB — URINALYSIS, ROUTINE W REFLEX MICROSCOPIC
Bilirubin Urine: NEGATIVE
Ketones, ur: NEGATIVE
Leukocytes,Ua: NEGATIVE
Nitrite: NEGATIVE
Specific Gravity, Urine: >=1.03 — AB (ref 1.000–1.030)
Total Protein, Urine: NEGATIVE
Urine Glucose: NEGATIVE
Urobilinogen, UA: 0.2 (ref 0.0–1.0)
pH: 5.5 (ref 5.0–8.0)

## 2019-02-16 LAB — BASIC METABOLIC PANEL
BUN: 24 mg/dL — ABNORMAL HIGH (ref 6–23)
CO2: 31 mEq/L (ref 19–32)
Calcium: 9.5 mg/dL (ref 8.4–10.5)
Chloride: 103 mEq/L (ref 96–112)
Creatinine, Ser: 1.53 mg/dL — ABNORMAL HIGH (ref 0.40–1.50)
GFR: 44.79 mL/min — ABNORMAL LOW (ref >60.00)
Glucose, Bld: 95 mg/dL (ref 70–99)
Potassium: 4.4 mEq/L (ref 3.5–5.1)
Sodium: 142 mEq/L (ref 135–145)

## 2019-02-16 LAB — LIPID PANEL
Cholesterol: 141 mg/dL (ref 0–200)
HDL: 31.7 mg/dL — ABNORMAL LOW (ref >39.00)
NonHDL: 108.9
Total CHOL/HDL Ratio: 4
Triglycerides: 210 mg/dL — ABNORMAL HIGH (ref 0.0–149.0)
VLDL: 42 mg/dL — ABNORMAL HIGH (ref 0.0–40.0)

## 2019-02-16 LAB — TSH: TSH: 2.53 u[IU]/mL (ref 0.35–4.50)

## 2019-02-16 LAB — HEPATIC FUNCTION PANEL
ALT: 15 U/L (ref 0–53)
AST: 19 U/L (ref 0–37)
Albumin: 4.7 g/dL (ref 3.5–5.2)
Alkaline Phosphatase: 48 U/L (ref 39–117)
Bilirubin, Direct: 0.1 mg/dL (ref 0.0–0.3)
Total Bilirubin: 0.7 mg/dL (ref 0.2–1.2)
Total Protein: 6.6 g/dL (ref 6.0–8.3)

## 2019-02-16 LAB — LDL CHOLESTEROL, DIRECT: Direct LDL: 71 mg/dL

## 2019-02-16 MED ORDER — LEVOCETIRIZINE DIHYDROCHLORIDE 5 MG PO TABS: ORAL_TABLET | ORAL | 3 refills | Status: DC

## 2019-02-16 MED ORDER — LOSARTAN POTASSIUM 50 MG PO TABS: 50.0000 mg | ORAL_TABLET | Freq: Every day | ORAL | 3 refills | Status: DC

## 2019-02-16 MED ORDER — MECLIZINE HCL 12.5 MG PO TABS: 12.5000 mg | ORAL_TABLET | Freq: Three times a day (TID) | ORAL | 1 refills | Status: AC | PRN

## 2019-02-16 MED ORDER — MELOXICAM 7.5 MG PO TABS: 7.5000 mg | ORAL_TABLET | Freq: Every day | ORAL | 2 refills | Status: DC

## 2019-02-16 MED ORDER — PRAVASTATIN SODIUM 40 MG PO TABS: 40.0000 mg | ORAL_TABLET | Freq: Every day | ORAL | 3 refills | Status: DC

## 2019-02-16 MED ORDER — HYDROCHLOROTHIAZIDE 12.5 MG PO TABS: 12.5000 mg | ORAL_TABLET | Freq: Every day | ORAL | 3 refills | Status: DC

## 2019-02-16 MED ORDER — TRAMADOL HCL 50 MG PO TABS: ORAL_TABLET | ORAL | 5 refills | Status: DC

## 2019-02-16 NOTE — Progress Notes (Signed)
Subjective:    Patient ID: Chad Gross, male    DOB: 02/09/1946, 73 y.o.   MRN: [id number]  HPI  Here for yearly f/u;  Overall doing ok;  Pt denies Chest pain, worsening SOB, DOE, wheezing, orthopnea, PND, worsening LE edema, palpitations, dizziness or syncope.  Pt denies neurological change such as new headache, facial or extremity weakness.  Pt denies polydipsia, polyuria, or low sugar symptoms. Pt states overall good compliance with treatment and medications, good tolerability, and has been trying to follow appropriate diet.  Pt denies worsening depressive symptoms, suicidal ideation or panic. No fever, night sweats, wt loss, loss of appetite, or other constitutional symptoms.  Pt states good ability with ADL's, has low fall risk, home safety reviewed and adequate, no other significant changes in hearing or vision, and not active with exercise.  Also with onset 1 wk right hearing loss, without HA, dizziness, sinus congestion, ST , fever or cough.   Past Medical History:  Diagnosis Date  . Chronic foot pain, right 02/07/2018  . CLL (chronic lymphocytic leukemia) ([place]) 02/07/2018  . ED (erectile dysfunction)   . History of DVT (deep vein thrombosis)   . Hyperlipidemia   . Hypertension   . Pericarditis   . Pulmonary embolism [facility])    Past Surgical History:  Procedure Laterality Date  . CARDIAC CATHETERIZATION  01/21/2011   EF 50-55%  . GREENFIELD FLITER PLACEMENT    . INGUINAL HERNIA REPAIR     BILATERAL  . KNEE ARTHROSCOPY Right 05/04/2018   Procedure: ARTHROSCOPY KNEE, PARTIAL MENISCECTOMY AND CHONDROPLASTY;  Surgeon: Melrose Nakayama, MD;  Location: [facility];  Service: Orthopedics;  Laterality: Right;    reports that he has never smoked. He has never used smokeless tobacco. He reports current alcohol use. He reports that he does not use drugs. family history includes Breast cancer in his mother; Heart failure in his father; Hypertension in his father and mother; Pneumonia in his father.  No Known Allergies Current Outpatient Medications on File Prior to Visit  Medication Sig Dispense Refill  . acetaminophen (TYLENOL) 500 MG tablet Take 500-1,000 mg by mouth every 6 (six) hours as needed for moderate pain or headache.    . Calcium-Magnesium-Zinc 167-83-8 MG TABS Take by mouth.    Marland Kitchen CALCIUM-MAGNESIUM-ZINC PO Take 1 tablet by mouth at bedtime.    . Coenzyme Q10 (COQ10) 100 MG CAPS Take 100 mg by mouth daily.    Marland Kitchen GLUCOSAMINE CHONDROITIN COMPLX PO Take 1 tablet by mouth daily.    . Multiple Vitamins-Minerals (SENIOR MULTIVITAMIN PLUS PO) Take 1 tablet by mouth at bedtime.    Marland Kitchen warfarin (COUMADIN) 10 MG tablet Take 1 tablet (10 mg total) by mouth daily at 6 PM. TAKE 1 TABLET DAILY AS DIRECTED. 90 tablet 3  . warfarin (COUMADIN) 2.5 MG tablet Take 1 tablet (2.5 mg total) by mouth every Monday, Wednesday, and Friday. For total dose of 12.5mg  on MWF. 36 tablet 3   No current facility-administered medications on file prior to visit.    Review of Systems  Constitutional: Negative for other unusual diaphoresis or sweats HENT: Negative for ear discharge or swelling Eyes: Negative for other worsening visual disturbances Respiratory: Negative for stridor or other swelling  Gastrointestinal: Negative for worsening distension or other blood Genitourinary: Negative for retention or other urinary change Musculoskeletal: Negative for other MSK pain or swelling Skin: Negative for color change or other new lesions Neurological: Negative for worsening tremors and other numbness  Psychiatric/Behavioral: Negative for  worsening agitation or other fatigue All other system neg per pt    Objective:   Physical Exam BP 118/76   Pulse 76   Temp 98 F (36.7 C) (Oral)   Ht 5\' 11"  (1.803 m)   Wt 213 lb (96.6 kg)   SpO2 93%   BMI 29.71 kg/m  VS noted,  Constitutional: Pt appears in NAD HENT: Head: NCAT.  Right Ear: External ear normal. Right canal clear and normal appearance after wax  impaction irrigated an hearing improved Left Ear: External ear normal.  Eyes: . Pupils are equal, round, and reactive to light. Conjunctivae and EOM are normal Nose: without d/c or deformity Neck: Neck supple. Gross normal ROM Cardiovascular: Normal rate and regular rhythm.   Pulmonary/Chest: Effort normal and breath sounds without rales or wheezing.  Abd:  Soft, NT, ND, + BS, no organomegaly Neurological: Pt is alert. At baseline orientation, motor grossly intact Skin: Skin is warm. No rashes, other new lesions, no LE edema Psychiatric: Pt behavior is normal without agitation  No other exam findings Lab Results  Component Value Date   WBC 44.7 (H) 02/14/2019   HGB 14.3 02/14/2019   HCT 43.9 02/14/2019   PLT 80 (L) 02/14/2019   GLUCOSE 95 02/16/2019   CHOL 141 02/16/2019   TRIG 210.0 (H) 02/16/2019   HDL 31.70 (L) 02/16/2019   LDLDIRECT 71.0 02/16/2019   LDLCALC 68 02/07/2018   ALT 15 02/16/2019   AST 19 02/16/2019   NA 142 02/16/2019   K 4.4 02/16/2019   CL 103 02/16/2019   CREATININE 1.53 (H) 02/16/2019   BUN 24 (H) 02/16/2019   CO2 31 02/16/2019   TSH 2.53 02/16/2019   PSA 2.72 02/16/2019   INR 2.6 (H) 02/14/2019       Assessment & Plan:

## 2019-02-16 NOTE — Telephone Encounter (Signed)
-----   Message from Ladell Pier, MD sent at 02/14/2019  8:08 PM EDT ----- Please call patient, continue same coumadin, f/u as scheduled, lymphocytes increased-but no indication for treating the CLL, platelets stable

## 2019-02-16 NOTE — Patient Instructions (Signed)
Your right ear was irrigated of wax today  .Please continue all other medications as before, and refills have been done if requested.  Please have the pharmacy call with any other refills you may need.  Please continue your efforts at being more active, low cholesterol diet, and weight control.  You are otherwise up to date with prevention measures today.  Please keep your appointments with your specialists as you may have planned  Please go to the LAB in the Basement (turn left off the elevator) for the tests to be done today  You will be contacted by phone if any changes need to be made immediately.  Otherwise, you will receive a letter about your results with an explanation, but please check with MyChart first.  In order to help keep our patients safe and at home we are billing the insurance company for a health consult. You could potentially get a copay to a maximum of $15. Do you agree to this in order to obtain our advice about your concerns?  Please return in 1 year for your yearly visit, or sooner if needed

## 2019-02-16 NOTE — Progress Notes (Signed)
Patient consent obtained. Irrigation with water and peroxide performed. Full view of tympanic membranes after procedure.  Patient tolerated procedure well.   

## 2019-02-16 NOTE — Telephone Encounter (Signed)
TCT patient regarding recent lab results. Spoke with patient. Per Dr. Benay Spice, pt is to continue coumadin at current dose, CBC stable with no indication to treat CLL at this time. Pt voiced understanding. No questions or concerns

## 2019-02-18 NOTE — Progress Notes (Signed)
Subjective:   Patient ID: Chad Gross, male   DOB: 73 y.o.   MRN: [id number]   HPI Patient states having a lot of pain in the sheath of the right peroneal and plantar fascial tendon.  States that it seems worse at night and is having trouble sleeping in his foot does not feel comfortable   ROS      Objective:  Physical Exam  Neurovascular status intact with patient's right peroneal tendon found to be inflamed and painful at its insertion base of fifth metatarsal and the plantar fascia moderately tender with palpation     Assessment:  Peroneal tendinitis along with plantar fascial inflammation right     Plan:  H&P conditions reviewed and I recommended careful sheath injection of the peroneal tendon today 3 mg Kenalog 5 mg Xylocaine and the utilization of night splint to try to support the foot at night and give him more comfort for sleep.  Advised on ice and explained him how to do it and reappoint to recheck

## 2019-02-18 NOTE — Assessment & Plan Note (Signed)
stable overall by history and exam, recent data reviewed with pt, and pt to continue medical treatment as before,  to f/u any worsening symptoms or concerns  

## 2019-02-18 NOTE — Assessment & Plan Note (Signed)
Resolved after irrigation,  to f/u any worsening symptoms or concerns

## 2019-02-20 ENCOUNTER — Encounter: Payer: Self-pay | Admitting: Internal Medicine

## 2019-03-08 ENCOUNTER — Telehealth: Payer: Self-pay | Admitting: Oncology

## 2019-03-08 NOTE — Telephone Encounter (Signed)
Called patient regarding upcoming appointments, set up Webex screen test with patient tomorrow. Patient is aware of upcoming appointment.

## 2019-03-09 ENCOUNTER — Encounter: Payer: Self-pay | Admitting: Oncology

## 2019-03-12 ENCOUNTER — Encounter: Payer: Self-pay | Admitting: Oncology

## 2019-03-13 ENCOUNTER — Other Ambulatory Visit: Payer: Self-pay | Admitting: *Deleted

## 2019-03-13 ENCOUNTER — Inpatient Hospital Stay: Payer: MEDICARE | Attending: Oncology | Admitting: Oncology

## 2019-03-13 ENCOUNTER — Other Ambulatory Visit: Payer: Self-pay

## 2019-03-13 ENCOUNTER — Inpatient Hospital Stay: Payer: MEDICARE

## 2019-03-13 ENCOUNTER — Telehealth: Payer: Self-pay | Admitting: Oncology

## 2019-03-13 ENCOUNTER — Other Ambulatory Visit: Payer: MEDICARE

## 2019-03-13 DIAGNOSIS — Z7901 Long term (current) use of anticoagulants: Secondary | ICD-10-CM

## 2019-03-13 DIAGNOSIS — D696 Thrombocytopenia, unspecified: Secondary | ICD-10-CM | POA: Diagnosis not present

## 2019-03-13 DIAGNOSIS — I82409 Acute embolism and thrombosis of unspecified deep veins of unspecified lower extremity: Secondary | ICD-10-CM

## 2019-03-13 DIAGNOSIS — C9110 Chronic lymphocytic leukemia of B-cell type not having achieved remission: Secondary | ICD-10-CM

## 2019-03-13 DIAGNOSIS — N529 Male erectile dysfunction, unspecified: Secondary | ICD-10-CM | POA: Diagnosis not present

## 2019-03-13 LAB — CBC WITH DIFFERENTIAL (CANCER CENTER ONLY)
Abs Immature Granulocytes: 0.13 10*3/uL — ABNORMAL HIGH (ref 0.00–0.07)
Basophils Absolute: 0 10*3/uL (ref 0.0–0.1)
Basophils Relative: 0 %
Eosinophils Absolute: 0.2 10*3/uL (ref 0.0–0.5)
Eosinophils Relative: 0 %
HCT: 43.5 % (ref 39.0–52.0)
Hemoglobin: 14.3 g/dL (ref 13.0–17.0)
Immature Granulocytes: 0 %
Lymphocytes Relative: 90 %
Lymphs Abs: 40.6 10*3/uL — ABNORMAL HIGH (ref 0.7–4.0)
MCH: 31.6 pg (ref 26.0–34.0)
MCHC: 32.9 g/dL (ref 30.0–36.0)
MCV: 96.2 fL (ref 80.0–100.0)
Monocytes Absolute: 1.4 10*3/uL — ABNORMAL HIGH (ref 0.1–1.0)
Monocytes Relative: 3 %
Neutro Abs: 3.3 10*3/uL (ref 1.7–7.7)
Neutrophils Relative %: 7 %
Platelet Count: 69 10*3/uL — ABNORMAL LOW (ref 150–400)
RBC: 4.52 MIL/uL (ref 4.22–5.81)
RDW: 12.7 % (ref 11.5–15.5)
WBC Count: 45.6 10*3/uL — ABNORMAL HIGH (ref 4.0–10.5)
nRBC: 0 % (ref 0.0–0.2)

## 2019-03-13 LAB — PROTIME-INR
INR: 2.2 — ABNORMAL HIGH (ref 0.8–1.2)
Prothrombin Time: 24.2 seconds — ABNORMAL HIGH (ref 11.4–15.2)

## 2019-03-13 MED ORDER — WARFARIN SODIUM 2.5 MG PO TABS: 2.5000 mg | ORAL_TABLET | ORAL | 3 refills | Status: DC

## 2019-03-13 NOTE — Progress Notes (Signed)
  [place] OFFICE VISIT PROGRESS NOTE  I connected with@ on 03/13/19 at 12:40 PM EDT by video conference and verified that I am speaking with the correct person using two identifiers.    Patient's location: Home Provider's location: Office   Diagnosis: CLL, chronic anticoagulation  INTERVAL HISTORY:   Mr. Zidek reports feeling well.  He continues Coumadin anticoagulation.  No bleeding or symptom of thrombosis.  No change in palpable lymph nodes.  No fever or night sweats.  No recent infection.  Objective:   Lab Results:  Lab Results  Component Value Date   WBC 45.6 (H) 03/13/2019   HGB 14.3 03/13/2019   HCT 43.5 03/13/2019   MCV 96.2 03/13/2019   PLT 69 (L) 03/13/2019   NEUTROABS 3.3 03/13/2019     Medications: I have reviewed the patient's current medications.  Assessment/Plan: 1. 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 He will return for a monthly PT check.  2. Indwelling IVC catheter.  3. 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  4. 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 erectile dysfunction-followed by Dr. Jeffie Pollock , status post placement of a penile implant at [facility] in April 2014  7. CT of the abdomen/pelvis at [facility] urology February 2014 for evaluation of hematuria-12 mm external iliac nodes and leftperiaortic retroperitoneal lymph node-11 mm-potentially related to CLL 8.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, peripheral lymphocytosis and small palpable lymph nodes   Disposition: [name] appears stable.  We reviewed the lab studies from today.  The PT/INR is therapeutic.  He will continue Coumadin at the current dose.  He has stable lymphocytosis and the hemoglobin remains in the normal range.  He  has mild to moderate thrombocytopenia, likely secondary to CLL.  He understands we will need to consider systemic treatment for the CLL if the platelet count falls further.  He knows to contact us for bleeding or bruising.  He will return for lab visit in 1 month.  He will be scheduled for an office and lab visit in 2 months.  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  I recommended he avoid close contact with others during the current viral pandemic.  He is at increased risk for infection and complications of infection with the CLL.   I discussed the assessment and treatment plan with the patient. The patient was provided an opportunity to ask questions and all were answered. The patient agreed with the plan and demonstrated an understanding of the instructions.   The patient was advised to call back or seek an in-person evaluation if the symptoms worsen or if the condition fails to improve as anticipated.  I provided 25 minutes face-to-face and documentation time during this encounter, and > 50% was spent counseling as documented under my assessment & plan.  Betsy Coder ANP/GNP-BC   03/13/2019 12:29 PM

## 2019-03-13 NOTE — Progress Notes (Signed)
WebEx visit w/MD today and patient requested refills on his warfarin 10 mg and 2.5 mg. Still has refills on the 10 mg warfarin, so only refilled the 2.5 mg.

## 2019-03-13 NOTE — Telephone Encounter (Signed)
Scheduled appt per 4/14 los. °

## 2019-03-27 ENCOUNTER — Encounter: Payer: Self-pay | Admitting: Oncology

## 2019-04-09 ENCOUNTER — Other Ambulatory Visit: Payer: Self-pay | Admitting: *Deleted

## 2019-04-09 DIAGNOSIS — Z7901 Long term (current) use of anticoagulants: Secondary | ICD-10-CM

## 2019-04-09 DIAGNOSIS — C9110 Chronic lymphocytic leukemia of B-cell type not having achieved remission: Secondary | ICD-10-CM

## 2019-04-11 ENCOUNTER — Other Ambulatory Visit: Payer: Self-pay

## 2019-04-11 ENCOUNTER — Inpatient Hospital Stay: Payer: MEDICARE | Attending: Oncology

## 2019-04-11 ENCOUNTER — Telehealth: Payer: Self-pay | Admitting: *Deleted

## 2019-04-11 DIAGNOSIS — Z7901 Long term (current) use of anticoagulants: Secondary | ICD-10-CM | POA: Insufficient documentation

## 2019-04-11 DIAGNOSIS — C9110 Chronic lymphocytic leukemia of B-cell type not having achieved remission: Secondary | ICD-10-CM | POA: Diagnosis present

## 2019-04-11 LAB — CBC WITH DIFFERENTIAL (CANCER CENTER ONLY)
Abs Immature Granulocytes: 0.16 10*3/uL — ABNORMAL HIGH (ref 0.00–0.07)
Basophils Absolute: 0 10*3/uL (ref 0.0–0.1)
Basophils Relative: 0 %
Eosinophils Absolute: 0.2 10*3/uL (ref 0.0–0.5)
Eosinophils Relative: 0 %
HCT: 44.6 % (ref 39.0–52.0)
Hemoglobin: 14.4 g/dL (ref 13.0–17.0)
Immature Granulocytes: 0 %
Lymphocytes Relative: 92 %
Lymphs Abs: 50.9 10*3/uL — ABNORMAL HIGH (ref 0.7–4.0)
MCH: 31.5 pg (ref 26.0–34.0)
MCHC: 32.3 g/dL (ref 30.0–36.0)
MCV: 97.6 fL (ref 80.0–100.0)
Monocytes Absolute: 1.8 10*3/uL — ABNORMAL HIGH (ref 0.1–1.0)
Monocytes Relative: 3 %
Neutro Abs: 3 10*3/uL (ref 1.7–7.7)
Neutrophils Relative %: 5 %
Platelet Count: 85 10*3/uL — ABNORMAL LOW (ref 150–400)
RBC: 4.57 MIL/uL (ref 4.22–5.81)
RDW: 13 % (ref 11.5–15.5)
WBC Count: 56.2 10*3/uL (ref 4.0–10.5)
nRBC: 0 % (ref 0.0–0.2)

## 2019-04-11 LAB — PROTIME-INR
INR: 2.8 — ABNORMAL HIGH (ref 0.8–1.2)
Prothrombin Time: 29.4 seconds — ABNORMAL HIGH (ref 11.4–15.2)

## 2019-04-11 NOTE — Telephone Encounter (Signed)
-----   Message from Ladell Pier, MD sent at 04/11/2019  1:41 PM EDT ----- Please call patient, CBC is stable, PT/INR is therapeutic, continue Coumadin same dose, follow-up as scheduled

## 2019-04-11 NOTE — Telephone Encounter (Signed)
TCT patient regarding lab results. Spoke with patient and reviewed his labs with him.  He is a bit concerned that his WBC is steadily climbing and will discuss with Dr. Benay Spice at his next visit onn 05/09/19. He was made aware of continuing same coumadin dose as well.  No further questions or concerns.

## 2019-04-16 ENCOUNTER — Encounter: Payer: Self-pay | Admitting: Oncology

## 2019-04-29 ENCOUNTER — Encounter: Payer: Self-pay | Admitting: Oncology

## 2019-05-09 ENCOUNTER — Inpatient Hospital Stay (HOSPITAL_BASED_OUTPATIENT_CLINIC_OR_DEPARTMENT_OTHER): Payer: MEDICARE | Admitting: Oncology

## 2019-05-09 ENCOUNTER — Other Ambulatory Visit: Payer: Self-pay

## 2019-05-09 ENCOUNTER — Encounter: Payer: Self-pay | Admitting: Oncology

## 2019-05-09 ENCOUNTER — Telehealth: Payer: Self-pay | Admitting: Oncology

## 2019-05-09 ENCOUNTER — Inpatient Hospital Stay: Payer: MEDICARE | Attending: Oncology

## 2019-05-09 VITALS — BP 147/79 | HR 84 | Temp 98.9°F | Resp 18 | Ht 71.0 in | Wt 217.8 lb

## 2019-05-09 DIAGNOSIS — D696 Thrombocytopenia, unspecified: Secondary | ICD-10-CM | POA: Insufficient documentation

## 2019-05-09 DIAGNOSIS — Z7901 Long term (current) use of anticoagulants: Secondary | ICD-10-CM

## 2019-05-09 DIAGNOSIS — C9110 Chronic lymphocytic leukemia of B-cell type not having achieved remission: Secondary | ICD-10-CM | POA: Insufficient documentation

## 2019-05-09 DIAGNOSIS — Z79899 Other long term (current) drug therapy: Secondary | ICD-10-CM | POA: Insufficient documentation

## 2019-05-09 DIAGNOSIS — N529 Male erectile dysfunction, unspecified: Secondary | ICD-10-CM

## 2019-05-09 LAB — CBC WITH DIFFERENTIAL (CANCER CENTER ONLY)
Abs Immature Granulocytes: 0.16 10*3/uL — ABNORMAL HIGH (ref 0.00–0.07)
Basophils Absolute: 0.1 10*3/uL (ref 0.0–0.1)
Basophils Relative: 0 %
Eosinophils Absolute: 0.3 10*3/uL (ref 0.0–0.5)
Eosinophils Relative: 0 %
HCT: 42.5 % (ref 39.0–52.0)
Hemoglobin: 14 g/dL (ref 13.0–17.0)
Immature Granulocytes: 0 %
Lymphocytes Relative: 91 %
Lymphs Abs: 56.9 10*3/uL — ABNORMAL HIGH (ref 0.7–4.0)
MCH: 32 pg (ref 26.0–34.0)
MCHC: 32.9 g/dL (ref 30.0–36.0)
MCV: 97.3 fL (ref 80.0–100.0)
Monocytes Absolute: 1.6 10*3/uL — ABNORMAL HIGH (ref 0.1–1.0)
Monocytes Relative: 3 %
Neutro Abs: 3.5 10*3/uL (ref 1.7–7.7)
Neutrophils Relative %: 6 %
Platelet Count: 94 10*3/uL — ABNORMAL LOW (ref 150–400)
RBC: 4.37 MIL/uL (ref 4.22–5.81)
RDW: 13 % (ref 11.5–15.5)
WBC Count: 62.5 10*3/uL (ref 4.0–10.5)
nRBC: 0 % (ref 0.0–0.2)

## 2019-05-09 LAB — PROTIME-INR
INR: 2.6 — ABNORMAL HIGH (ref 0.8–1.2)
Prothrombin Time: 27.7 s — ABNORMAL HIGH (ref 11.4–15.2)

## 2019-05-09 NOTE — Progress Notes (Signed)
  [place] OFFICE PROGRESS NOTE   Diagnosis: CLL, chronic anticoagulation  INTERVAL HISTORY:   Mr. Stille returns as scheduled.  He feels well.  No fever or night sweats.  Good appetite.  No bleeding.  Objective:  Vital signs in last 24 hours:  Blood pressure (!) 147/79, pulse 84, temperature 98.9 F (37.2 C), temperature source Oral, resp. rate 18, height 5\' 11"  (1.803 m), weight 217 lb 12.8 oz (98.8 kg), SpO2 95 %.    HEENT: Neck without mass Lymphatics: 1-1.5 cm bilateral scalenes/low posterior cervical nodes, 1 cm left submandibular node, 1 cm or less submental nodes, 1 cm right axillary node.  No left axillary or inguinal nodes GI: No hepatosplenomegaly Vascular: The right lower leg is slightly larger than the left side   Lab Results:  Lab Results  Component Value Date   WBC 62.5 (HH) 05/09/2019   HGB 14.0 05/09/2019   HCT 42.5 05/09/2019   MCV 97.3 05/09/2019   PLT 94 (L) 05/09/2019   NEUTROABS 3.5 05/09/2019    CMP  Lab Results  Component Value Date   NA 142 02/16/2019   K 4.4 02/16/2019   CL 103 02/16/2019   CO2 31 02/16/2019   GLUCOSE 95 02/16/2019   BUN 24 (H) 02/16/2019   CREATININE 1.53 (H) 02/16/2019   CALCIUM 9.5 02/16/2019   PROT 6.6 02/16/2019   ALBUMIN 4.7 02/16/2019   AST 19 02/16/2019   ALT 15 02/16/2019   ALKPHOS 48 02/16/2019   BILITOT 0.7 02/16/2019   GFRNONAA 53 (L) 05/03/2018   GFRAA >60 05/03/2018     Medications: I have reviewed the patient's current medications.   Assessment/Plan: 1. 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 He will return for a monthly PT check.  2. Indwelling IVC catheter.  3. 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  4. 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 erectile dysfunction-followed by Dr. Jeffie Pollock , status post placement of a penile implant at [facility] in April  2014  7. CT of the abdomen/pelvis at [facility] urology February 2014 for evaluation of hematuria-12 mm external iliac nodes and leftperiaortic retroperitoneal lymph node-11 mm-potentially related to CLL 8.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, peripheral lymphocytosis and small palpable lymph nodes     Disposition: Mr. Rockett appears unchanged.  The PT/INR is therapeutic.  He appears asymptomatic from the CLL.  The plan is to continue observation.  He is at increased risk for bleeding with mild thrombocytopenia while on Coumadin.  He will call for bleeding or bruising.  He will discontinue meloxicam.  Mr. Raben will maintain social distancing with the [place] pandemic.  Betsy Coder, MD  05/09/2019  11:10 AM

## 2019-05-09 NOTE — Telephone Encounter (Signed)
Scheduled appt per 6/10 los. Left a voice message of appt date and time. °

## 2019-05-14 ENCOUNTER — Other Ambulatory Visit: Payer: Self-pay | Admitting: Internal Medicine

## 2019-06-06 ENCOUNTER — Telehealth: Payer: Self-pay | Admitting: *Deleted

## 2019-06-06 ENCOUNTER — Inpatient Hospital Stay: Payer: MEDICARE | Attending: Oncology

## 2019-06-06 ENCOUNTER — Other Ambulatory Visit: Payer: Self-pay

## 2019-06-06 DIAGNOSIS — C9110 Chronic lymphocytic leukemia of B-cell type not having achieved remission: Secondary | ICD-10-CM | POA: Insufficient documentation

## 2019-06-06 DIAGNOSIS — Z7901 Long term (current) use of anticoagulants: Secondary | ICD-10-CM | POA: Diagnosis not present

## 2019-06-06 LAB — CBC WITH DIFFERENTIAL (CANCER CENTER ONLY)
Abs Immature Granulocytes: 0.15 10*3/uL — ABNORMAL HIGH (ref 0.00–0.07)
Basophils Absolute: 0.1 10*3/uL (ref 0.0–0.1)
Basophils Relative: 0 %
Eosinophils Absolute: 0.3 10*3/uL (ref 0.0–0.5)
Eosinophils Relative: 0 %
HCT: 42 % (ref 39.0–52.0)
Hemoglobin: 13.4 g/dL (ref 13.0–17.0)
Immature Granulocytes: 0 %
Lymphocytes Relative: 92 %
Lymphs Abs: 63.2 10*3/uL — ABNORMAL HIGH (ref 0.7–4.0)
MCH: 31.8 pg (ref 26.0–34.0)
MCHC: 31.9 g/dL (ref 30.0–36.0)
MCV: 99.5 fL (ref 80.0–100.0)
Monocytes Absolute: 2.1 10*3/uL — ABNORMAL HIGH (ref 0.1–1.0)
Monocytes Relative: 3 %
Neutro Abs: 3.4 10*3/uL (ref 1.7–7.7)
Neutrophils Relative %: 5 %
Platelet Count: 77 10*3/uL — ABNORMAL LOW (ref 150–400)
RBC: 4.22 MIL/uL (ref 4.22–5.81)
RDW: 13.3 % (ref 11.5–15.5)
WBC Count: 69.2 10*3/uL (ref 4.0–10.5)
nRBC: 0 % (ref 0.0–0.2)

## 2019-06-06 LAB — PROTIME-INR
INR: 2.4 — ABNORMAL HIGH (ref 0.8–1.2)
Prothrombin Time: 26 seconds — ABNORMAL HIGH (ref 11.4–15.2)

## 2019-06-06 NOTE — Telephone Encounter (Signed)
Left VM with lab results and continue same warfarin (see flowsheet). F/U on 07/11/19.

## 2019-06-19 ENCOUNTER — Encounter: Payer: Self-pay | Admitting: Internal Medicine

## 2019-06-19 ENCOUNTER — Encounter: Payer: Self-pay | Admitting: Oncology

## 2019-06-22 ENCOUNTER — Encounter: Payer: Self-pay | Admitting: Oncology

## 2019-07-11 ENCOUNTER — Inpatient Hospital Stay: Payer: MEDICARE | Attending: Oncology

## 2019-07-11 ENCOUNTER — Telehealth: Payer: Self-pay | Admitting: *Deleted

## 2019-07-11 ENCOUNTER — Other Ambulatory Visit: Payer: Self-pay

## 2019-07-11 ENCOUNTER — Encounter: Payer: Self-pay | Admitting: Nurse Practitioner

## 2019-07-11 ENCOUNTER — Inpatient Hospital Stay (HOSPITAL_BASED_OUTPATIENT_CLINIC_OR_DEPARTMENT_OTHER): Payer: MEDICARE | Admitting: Nurse Practitioner

## 2019-07-11 VITALS — BP 146/83 | HR 92 | Temp 97.1°F | Resp 18 | Ht 71.0 in | Wt 214.2 lb

## 2019-07-11 DIAGNOSIS — Z7901 Long term (current) use of anticoagulants: Secondary | ICD-10-CM | POA: Diagnosis not present

## 2019-07-11 DIAGNOSIS — C9110 Chronic lymphocytic leukemia of B-cell type not having achieved remission: Secondary | ICD-10-CM | POA: Insufficient documentation

## 2019-07-11 DIAGNOSIS — D696 Thrombocytopenia, unspecified: Secondary | ICD-10-CM | POA: Insufficient documentation

## 2019-07-11 DIAGNOSIS — I82409 Acute embolism and thrombosis of unspecified deep veins of unspecified lower extremity: Secondary | ICD-10-CM | POA: Diagnosis not present

## 2019-07-11 DIAGNOSIS — N529 Male erectile dysfunction, unspecified: Secondary | ICD-10-CM | POA: Insufficient documentation

## 2019-07-11 LAB — CBC WITH DIFFERENTIAL (CANCER CENTER ONLY)
Abs Immature Granulocytes: 0.18 10*3/uL — ABNORMAL HIGH (ref 0.00–0.07)
Basophils Absolute: 0 10*3/uL (ref 0.0–0.1)
Basophils Relative: 0 %
Eosinophils Absolute: 0.3 10*3/uL (ref 0.0–0.5)
Eosinophils Relative: 0 %
HCT: 42.5 % (ref 39.0–52.0)
Hemoglobin: 13.6 g/dL (ref 13.0–17.0)
Immature Granulocytes: 0 %
Lymphocytes Relative: 94 %
Lymphs Abs: 70.5 10*3/uL — ABNORMAL HIGH (ref 0.7–4.0)
MCH: 31.6 pg (ref 26.0–34.0)
MCHC: 32 g/dL (ref 30.0–36.0)
MCV: 98.8 fL (ref 80.0–100.0)
Monocytes Absolute: 1.7 10*3/uL — ABNORMAL HIGH (ref 0.1–1.0)
Monocytes Relative: 2 %
Neutro Abs: 2.9 10*3/uL (ref 1.7–7.7)
Neutrophils Relative %: 4 %
Platelet Count: 91 10*3/uL — ABNORMAL LOW (ref 150–400)
RBC: 4.3 MIL/uL (ref 4.22–5.81)
RDW: 13.3 % (ref 11.5–15.5)
WBC Count: 75.6 10*3/uL (ref 4.0–10.5)
nRBC: 0 % (ref 0.0–0.2)

## 2019-07-11 LAB — PROTIME-INR
INR: 2.5 — ABNORMAL HIGH (ref 0.8–1.2)
Prothrombin Time: 26.6 seconds — ABNORMAL HIGH (ref 11.4–15.2)

## 2019-07-11 NOTE — Telephone Encounter (Signed)
Received call report from Marty MT.  "Today's WBC = 75.6."  Reached collaborative nurse  with results.  F/U scheduled today with A.P.P.

## 2019-07-11 NOTE — Progress Notes (Addendum)
  [place] OFFICE PROGRESS NOTE   Diagnosis: CLL, chronic anticoagulation  INTERVAL HISTORY:   Chad Gross returns as scheduled.  He denies spontaneous bleeding.  No fevers or sweats.  He has a good appetite.  No change in peripheral adenopathy.  No interim illnesses or infections.  Objective:  Vital signs in last 24 hours:  Blood pressure (!) 146/83, pulse 92, temperature (!) 97.1 F (36.2 C), temperature source Temporal, resp. rate 18, height 5\' 11"  (1.803 m), weight 214 lb 3 oz (97.2 kg), SpO2 97 %.    HEENT: No thrush or ulcers. Lymphatics: 1 to 1-1/2 cm bilateral scalene/low neck lymph nodes.  Small submental node.  Approximate 1 cm right axillary lymph node.  No left axillary lymph nodes.  Pea-sized inguinal nodes. GI: Abdomen soft and nontender.  No hepatosplenomegaly. Vascular: Right lower leg is larger than the left lower leg.    Lab Results:  Lab Results  Component Value Date   WBC 75.6 (HH) 07/11/2019   HGB 13.6 07/11/2019   HCT 42.5 07/11/2019   MCV 98.8 07/11/2019   PLT 91 (L) 07/11/2019   NEUTROABS PENDING 07/11/2019    Imaging:  No results found.  Medications: I have reviewed the patient's current medications.  Assessment/Plan: 1. 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 He will return for a monthly PT check.  2. Indwelling IVC catheter.  3. 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  4. 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 erectile dysfunction-followed by Dr. Jeffie Pollock , status post placement of a penile implant at [facility] in April 2014  7. CT of the abdomen/pelvis at [facility] urology February 2014 for evaluation of hematuria-12 mm external iliac nodes and leftperiaortic retroperitoneal lymph node-11 mm-potentially related to CLL 8.CLL-flow cytometry of the peripheral blood 12/27/2017 monoclonal  B-cell population consistent with CLL, peripheral lymphocytosis and small palpable lymph nodes  Disposition: Chad Gross appears unchanged.  He is stable in regard to the peripheral adenopathy as well as from a hematologic standpoint.  There is no indication to begin treatment of CLL at present.  The plan is to continue observation.  We will follow-up on the PT/INR from today.  We will contact him if any changes to the Coumadin dose are necessary.  He will return for lab in 1 month, lab and follow-up in 2 months.  Patient seen with Dr. Benay Spice.   [name] ANP/GNP-BC   This was a shared visit with [name].  Chad Gross is stable from a hematologic standpoint.  The plan is to continue observation for the CLL.  Julieanne Manson, MD  07/11/2019  10:22 AM

## 2019-07-13 ENCOUNTER — Telehealth: Payer: Self-pay

## 2019-07-13 NOTE — Telephone Encounter (Signed)
TC to pt per Lattie Haw to let him know that the PT/INR is in goal range. Continue same dose of Coumadin. and Follow-up as scheduled. Pt verbalized understanding. no further problems or concerns at this at this time.

## 2019-07-16 ENCOUNTER — Encounter: Payer: Self-pay | Admitting: Oncology

## 2019-07-17 ENCOUNTER — Other Ambulatory Visit: Payer: Self-pay | Admitting: *Deleted

## 2019-07-17 DIAGNOSIS — Z23 Encounter for immunization: Secondary | ICD-10-CM

## 2019-07-17 MED ORDER — INFLUENZA VAC SPLIT HIGH-DOSE 0.5 ML IM SUSY: 0.5000 mL | PREFILLED_SYRINGE | Freq: Once | INTRAMUSCULAR | Status: DC

## 2019-07-28 ENCOUNTER — Other Ambulatory Visit: Payer: Self-pay | Admitting: Internal Medicine

## 2019-07-30 MED ORDER — LOSARTAN POTASSIUM 25 MG PO TABS: 50.0000 mg | ORAL_TABLET | Freq: Every day | ORAL | 0 refills | Status: DC

## 2019-08-15 ENCOUNTER — Other Ambulatory Visit: Payer: Self-pay

## 2019-08-15 ENCOUNTER — Inpatient Hospital Stay: Payer: MEDICARE | Attending: Oncology

## 2019-08-15 ENCOUNTER — Telehealth: Payer: Self-pay | Admitting: *Deleted

## 2019-08-15 ENCOUNTER — Inpatient Hospital Stay: Payer: MEDICARE

## 2019-08-15 VITALS — BP 130/62 | Temp 98.2°F | Resp 18

## 2019-08-15 DIAGNOSIS — Z23 Encounter for immunization: Secondary | ICD-10-CM | POA: Diagnosis not present

## 2019-08-15 DIAGNOSIS — C9110 Chronic lymphocytic leukemia of B-cell type not having achieved remission: Secondary | ICD-10-CM | POA: Diagnosis present

## 2019-08-15 DIAGNOSIS — Z7901 Long term (current) use of anticoagulants: Secondary | ICD-10-CM

## 2019-08-15 DIAGNOSIS — I82409 Acute embolism and thrombosis of unspecified deep veins of unspecified lower extremity: Secondary | ICD-10-CM

## 2019-08-15 LAB — CBC WITH DIFFERENTIAL (CANCER CENTER ONLY)
Abs Immature Granulocytes: 0.17 10*3/uL — ABNORMAL HIGH (ref 0.00–0.07)
Basophils Absolute: 0 10*3/uL (ref 0.0–0.1)
Basophils Relative: 0 %
Eosinophils Absolute: 0.3 10*3/uL (ref 0.0–0.5)
Eosinophils Relative: 0 %
HCT: 40.9 % (ref 39.0–52.0)
Hemoglobin: 13 g/dL (ref 13.0–17.0)
Immature Granulocytes: 0 %
Lymphocytes Relative: 94 %
Lymphs Abs: 73.7 10*3/uL — ABNORMAL HIGH (ref 0.7–4.0)
MCH: 31.7 pg (ref 26.0–34.0)
MCHC: 31.8 g/dL (ref 30.0–36.0)
MCV: 99.8 fL (ref 80.0–100.0)
Monocytes Absolute: 1.5 10*3/uL — ABNORMAL HIGH (ref 0.1–1.0)
Monocytes Relative: 2 %
Neutro Abs: 2.8 10*3/uL (ref 1.7–7.7)
Neutrophils Relative %: 4 %
Platelet Count: 74 10*3/uL — ABNORMAL LOW (ref 150–400)
RBC: 4.1 MIL/uL — ABNORMAL LOW (ref 4.22–5.81)
RDW: 13.3 % (ref 11.5–15.5)
WBC Count: 78.5 10*3/uL (ref 4.0–10.5)
nRBC: 0 % (ref 0.0–0.2)

## 2019-08-15 LAB — PROTIME-INR
INR: 3 — ABNORMAL HIGH (ref 0.8–1.2)
Prothrombin Time: 30.7 seconds — ABNORMAL HIGH (ref 11.4–15.2)

## 2019-08-15 MED ORDER — INFLUENZA VAC A&B SA ADJ QUAD 0.5 ML IM PRSY
PREFILLED_SYRINGE | INTRAMUSCULAR | Status: AC
  Filled 2019-08-15: qty 0.5

## 2019-08-15 MED ORDER — INFLUENZA VAC A&B SA ADJ QUAD 0.5 ML IM PRSY
0.5000 mL | PREFILLED_SYRINGE | INTRAMUSCULAR | Status: AC
  Administered 2019-08-15: 0.5 mL via INTRAMUSCULAR

## 2019-08-15 NOTE — Telephone Encounter (Signed)
CRITICAL VALUE STICKER  CRITICAL VALUE: WBC = 78.5.  RECEIVER (on-site recipient of call): Cherylynn Ridges RN, Triage [place] NOTIFIED: 08/15/2019 at 1152.   MESSENGER (representative from lab): Kushani MT Myles Gip  MD NOTIFIED: Collaborative.  TIME OF NOTIFICATION: 08/15/2019 at 1220.  RESPONSE: None

## 2019-08-15 NOTE — Telephone Encounter (Signed)
Critical value called in from lab WBC 78.5 Dr Benay Spice made aware

## 2019-08-15 NOTE — Patient Instructions (Signed)
Influenza Virus Vaccine injection What is this medicine? INFLUENZA VIRUS VACCINE (in floo EN zuh VAHY ruhs vak SEEN) helps to reduce the risk of getting influenza also known as the flu. The vaccine only helps protect you against some strains of the flu. This medicine may be used for other purposes; ask your health care provider or pharmacist if you have questions. COMMON BRAND NAME(S): Afluria, Afluria Quadrivalent, Agriflu, Alfuria, FLUAD, Fluarix, Fluarix Quadrivalent, Flublok, Flublok Quadrivalent, FLUCELVAX, Flulaval, Fluvirin, Fluzone, Fluzone High-Dose, Fluzone Intradermal What should I tell my health care provider before I take this medicine? They need to know if you have any of these conditions:  bleeding disorder like hemophilia  fever or infection  Guillain-Barre syndrome or other neurological problems  immune system problems  infection with the human immunodeficiency virus (HIV) or AIDS  low blood platelet counts  multiple sclerosis  an unusual or allergic reaction to influenza virus vaccine, latex, other medicines, foods, dyes, or preservatives. Different brands of vaccines contain different allergens. Some may contain latex or eggs. Talk to your doctor about your allergies to make sure that you get the right vaccine.  pregnant or trying to get pregnant  breast-feeding How should I use this medicine? This vaccine is for injection into a muscle or under the skin. It is given by a health care professional. A copy of Vaccine Information Statements will be given before each vaccination. Read this sheet carefully each time. The sheet may change frequently. Talk to your healthcare provider to see which vaccines are right for you. Some vaccines should not be used in all age groups. Overdosage: If you think you have taken too much of this medicine contact a poison control center or emergency room at once. NOTE: This medicine is only for you. Do not share this medicine with  others. What if I miss a dose? This does not apply. What may interact with this medicine?  chemotherapy or radiation therapy  medicines that lower your immune system like etanercept, anakinra, infliximab, and adalimumab  medicines that treat or prevent blood clots like warfarin  phenytoin  steroid medicines like prednisone or cortisone  theophylline  vaccines This list may not describe all possible interactions. Give your health care provider a list of all the medicines, herbs, non-prescription drugs, or dietary supplements you use. Also tell them if you smoke, drink alcohol, or use illegal drugs. Some items may interact with your medicine. What should I watch for while using this medicine? Report any side effects that do not go away within 3 days to your doctor or health care professional. Call your health care provider if any unusual symptoms occur within 6 weeks of receiving this vaccine. You may still catch the flu, but the illness is not usually as bad. You cannot get the flu from the vaccine. The vaccine will not protect against colds or other illnesses that may cause fever. The vaccine is needed every year. What side effects may I notice from receiving this medicine? Side effects that you should report to your doctor or health care professional as soon as possible:  allergic reactions like skin rash, itching or hives, swelling of the face, lips, or tongue Side effects that usually do not require medical attention (report to your doctor or health care professional if they continue or are bothersome):  fever  headache  muscle aches and pains  pain, tenderness, redness, or swelling at the injection site  tiredness This list may not describe all possible side effects. Call your   doctor for medical advice about side effects. You may report side effects to FDA at 1-800-FDA-1088. Where should I keep my medicine? The vaccine will be given by a health care professional in a  clinic, pharmacy, doctor's office, or other health care setting. You will not be given vaccine doses to store at home. NOTE: This sheet is a summary. It may not cover all possible information. If you have questions about this medicine, talk to your doctor, pharmacist, or health care provider.  2020 Elsevier/Gold Standard (2018-10-10 08:45:43)  

## 2019-08-20 ENCOUNTER — Encounter: Payer: Self-pay | Admitting: Oncology

## 2019-08-21 NOTE — Telephone Encounter (Signed)
Notified patient that counts are stable, PT/INR are therapeutic, continue same dose of coumadin. Repeat CBC, PT/INR next month. Call for any bleeding.  Pt verbalized understanding

## 2019-08-23 ENCOUNTER — Encounter: Payer: Self-pay | Admitting: Oncology

## 2019-08-23 ENCOUNTER — Other Ambulatory Visit: Payer: Self-pay | Admitting: Internal Medicine

## 2019-08-24 ENCOUNTER — Other Ambulatory Visit: Payer: Self-pay

## 2019-08-24 ENCOUNTER — Encounter: Payer: Self-pay | Admitting: Internal Medicine

## 2019-08-24 DIAGNOSIS — Z20822 Contact with and (suspected) exposure to covid-19: Secondary | ICD-10-CM

## 2019-08-24 DIAGNOSIS — Z20828 Contact with and (suspected) exposure to other viral communicable diseases: Secondary | ICD-10-CM

## 2019-08-24 NOTE — Telephone Encounter (Signed)
[place] for staff to contact pt - to please head to [address] ([facility] for [place] testing

## 2019-08-25 LAB — NOVEL CORONAVIRUS, NAA: SARS-CoV-2, NAA: NOT DETECTED

## 2019-09-10 ENCOUNTER — Encounter: Payer: Self-pay | Admitting: Oncology

## 2019-09-12 ENCOUNTER — Other Ambulatory Visit: Payer: Self-pay | Admitting: Internal Medicine

## 2019-09-13 ENCOUNTER — Inpatient Hospital Stay: Payer: MEDICARE

## 2019-09-13 ENCOUNTER — Telehealth: Payer: Self-pay | Admitting: Oncology

## 2019-09-13 ENCOUNTER — Inpatient Hospital Stay: Payer: MEDICARE | Attending: Oncology | Admitting: Oncology

## 2019-09-13 ENCOUNTER — Other Ambulatory Visit: Payer: Self-pay

## 2019-09-13 ENCOUNTER — Telehealth: Payer: Self-pay | Admitting: *Deleted

## 2019-09-13 VITALS — BP 143/70 | HR 93 | Temp 97.8°F | Resp 18 | Ht 71.0 in | Wt 214.9 lb

## 2019-09-13 DIAGNOSIS — N529 Male erectile dysfunction, unspecified: Secondary | ICD-10-CM | POA: Diagnosis not present

## 2019-09-13 DIAGNOSIS — I82409 Acute embolism and thrombosis of unspecified deep veins of unspecified lower extremity: Secondary | ICD-10-CM | POA: Diagnosis not present

## 2019-09-13 DIAGNOSIS — Z7901 Long term (current) use of anticoagulants: Secondary | ICD-10-CM | POA: Diagnosis not present

## 2019-09-13 DIAGNOSIS — C9110 Chronic lymphocytic leukemia of B-cell type not having achieved remission: Secondary | ICD-10-CM | POA: Insufficient documentation

## 2019-09-13 DIAGNOSIS — D696 Thrombocytopenia, unspecified: Secondary | ICD-10-CM | POA: Diagnosis not present

## 2019-09-13 LAB — CBC WITH DIFFERENTIAL (CANCER CENTER ONLY)
Abs Immature Granulocytes: 0.21 10*3/uL — ABNORMAL HIGH (ref 0.00–0.07)
Basophils Absolute: 0 10*3/uL (ref 0.0–0.1)
Basophils Relative: 0 %
Eosinophils Absolute: 0.3 10*3/uL (ref 0.0–0.5)
Eosinophils Relative: 0 %
HCT: 40.7 % (ref 39.0–52.0)
Hemoglobin: 12.8 g/dL — ABNORMAL LOW (ref 13.0–17.0)
Immature Granulocytes: 0 %
Lymphocytes Relative: 93 %
Lymphs Abs: 78.7 10*3/uL — ABNORMAL HIGH (ref 0.7–4.0)
MCH: 31.1 pg (ref 26.0–34.0)
MCHC: 31.4 g/dL (ref 30.0–36.0)
MCV: 99 fL (ref 80.0–100.0)
Monocytes Absolute: 1.6 10*3/uL — ABNORMAL HIGH (ref 0.1–1.0)
Monocytes Relative: 2 %
Neutro Abs: 4.2 10*3/uL (ref 1.7–7.7)
Neutrophils Relative %: 5 %
Platelet Count: 89 10*3/uL — ABNORMAL LOW (ref 150–400)
RBC: 4.11 MIL/uL — ABNORMAL LOW (ref 4.22–5.81)
RDW: 13 % (ref 11.5–15.5)
WBC Count: 85.1 10*3/uL (ref 4.0–10.5)
nRBC: 0 % (ref 0.0–0.2)

## 2019-09-13 LAB — PROTIME-INR
INR: 2.5 — ABNORMAL HIGH (ref 0.8–1.2)
Prothrombin Time: 26.2 seconds — ABNORMAL HIGH (ref 11.4–15.2)

## 2019-09-13 NOTE — Telephone Encounter (Signed)
Scheduled appt per 10/15 los - pt aware of appt date and time

## 2019-09-13 NOTE — Telephone Encounter (Signed)
CRITICAL VALUE STICKER  CRITICAL VALUE: WBC = 85.1.  RECEIVER (on-site recipient of call): Mining engineer, Triage [place].   DATE & TIME NOTIFIED: 09/13/2019 at 1112.   MESSENGER (representative from lab): Kushani MT [employer].  MD NOTIFIED: Collaborative nurse.  TIME OF NOTIFICATION: 09/13/2019 at 1132.  RESPONSE: None. Provider visit in progress.

## 2019-09-13 NOTE — Progress Notes (Signed)
  [name]   Diagnosis: CLL, chronic anticoagulation  INTERVAL HISTORY:   Chad Gross returns as scheduled.  He continues Coumadin.  He had an upper respiratory infection several weeks ago and had a negative Covid test.  He received a shingles vaccine earlier this week.  No fever or anorexia.  Occasional night sweats.  No other complaint.  Objective:  Vital signs in last 24 hours:  Blood pressure (!) 143/70, pulse 93, temperature 97.8 F (36.6 C), temperature source Temporal, resp. rate 18, height 5\' 11"  (1.803 m), weight 214 lb 14.4 oz (97.5 kg), SpO2 98 %.    Lymphatics: 1.5 cm bilateral low posterior cervical nodes, 1-2 cm bilateral axillary nodes, no inguinal nodes GI: No hepatosplenomegaly, firm masslike fullness in the mid upper abdomen with associated tenderness Vascular: No leg edema     Lab Results:  Lab Results  Component Value Date   WBC 85.1 (HH) 09/13/2019   HGB 12.8 (L) 09/13/2019   HCT 40.7 09/13/2019   MCV 99.0 09/13/2019   PLT 89 (L) 09/13/2019   NEUTROABS 4.2 09/13/2019    CMP  Lab Results  Component Value Date   NA 142 02/16/2019   K 4.4 02/16/2019   CL 103 02/16/2019   CO2 31 02/16/2019   GLUCOSE 95 02/16/2019   BUN 24 (H) 02/16/2019   CREATININE 1.53 (H) 02/16/2019   CALCIUM 9.5 02/16/2019   PROT 6.6 02/16/2019   ALBUMIN 4.7 02/16/2019   AST 19 02/16/2019   ALT 15 02/16/2019   ALKPHOS 48 02/16/2019   BILITOT 0.7 02/16/2019   GFRNONAA 53 (L) 05/03/2018   GFRAA >60 05/03/2018     Medications: I have reviewed the patient's current medications.   Assessment/Plan: 1. 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 He will return for a monthly PT check.  2. Indwelling IVC catheter.  3. 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  4. 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 erectile  dysfunction-followed by Dr. Jeffie Pollock , status post placement of a penile implant at [facility] in April 2014  7. CT of the abdomen/pelvis at [facility] urology February 2014 for evaluation of hematuria-12 mm external iliac nodes and leftperiaortic retroperitoneal lymph node-11 mm-potentially related to CLL 8.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, peripheral lymphocytosis and small palpable lymph nodes  Disposition: Chad Gross appears unchanged.  He is stable from a hematologic standpoint.  We will follow up on the PT/INR from today and adjust the Coumadin dose as indicated.  He is at increased risk for bleeding with Coumadin anticoagulation, thrombocytopenia, and meloxicam.  He will discontinue the meloxicam to see if this makes a difference in his arthritis pain.  There is a palpable fullness in the mid upper abdomen on exam today.  He will be referred for a noncontrast abdomen/pelvis CT.  He has a history of renal insufficiency.  Chad Gross will return for an office visit in 1 month.  He will contact us in the interim as needed.  Betsy Coder, MD  09/13/2019  11:43 AM

## 2019-09-20 ENCOUNTER — Ambulatory Visit (HOSPITAL_COMMUNITY)
Admission: RE | Admit: 2019-09-20 | Discharge: 2019-09-20 | Disposition: A | Discharge status: Home / Self Care | Payer: MEDICARE | Source: Ambulatory Visit | Attending: Oncology | Admitting: Oncology

## 2019-09-20 ENCOUNTER — Other Ambulatory Visit: Payer: Self-pay

## 2019-09-20 DIAGNOSIS — C9110 Chronic lymphocytic leukemia of B-cell type not having achieved remission: Secondary | ICD-10-CM | POA: Diagnosis present

## 2019-09-21 ENCOUNTER — Telehealth: Payer: Self-pay | Admitting: *Deleted

## 2019-09-21 NOTE — Telephone Encounter (Signed)
Notified of CT results and to f/u as scheduled

## 2019-09-21 NOTE — Telephone Encounter (Signed)
-----   Message from Ladell Pier, MD sent at 09/20/2019  4:45 PM EDT ----- Please call patient, CT shows enlarged lymph nodes as expected, no large mass,no need to treat at present, f/u as scheduled

## 2019-09-25 ENCOUNTER — Encounter: Payer: Self-pay | Admitting: Oncology

## 2019-10-02 ENCOUNTER — Encounter: Payer: Self-pay | Admitting: Oncology

## 2019-10-18 ENCOUNTER — Other Ambulatory Visit: Payer: Self-pay

## 2019-10-18 ENCOUNTER — Telehealth: Payer: Self-pay | Admitting: Oncology

## 2019-10-18 ENCOUNTER — Inpatient Hospital Stay: Payer: MEDICARE | Attending: Oncology | Admitting: Oncology

## 2019-10-18 ENCOUNTER — Encounter: Payer: Self-pay | Admitting: Oncology

## 2019-10-18 ENCOUNTER — Inpatient Hospital Stay: Payer: MEDICARE

## 2019-10-18 ENCOUNTER — Telehealth: Payer: Self-pay | Admitting: *Deleted

## 2019-10-18 ENCOUNTER — Encounter: Payer: Self-pay | Admitting: *Deleted

## 2019-10-18 VITALS — BP 110/68 | HR 79 | Temp 97.9°F | Resp 18 | Ht 71.0 in | Wt 209.0 lb

## 2019-10-18 DIAGNOSIS — C9110 Chronic lymphocytic leukemia of B-cell type not having achieved remission: Secondary | ICD-10-CM | POA: Diagnosis present

## 2019-10-18 DIAGNOSIS — Z79899 Other long term (current) drug therapy: Secondary | ICD-10-CM | POA: Insufficient documentation

## 2019-10-18 DIAGNOSIS — D696 Thrombocytopenia, unspecified: Secondary | ICD-10-CM | POA: Diagnosis not present

## 2019-10-18 DIAGNOSIS — N529 Male erectile dysfunction, unspecified: Secondary | ICD-10-CM | POA: Insufficient documentation

## 2019-10-18 DIAGNOSIS — I82409 Acute embolism and thrombosis of unspecified deep veins of unspecified lower extremity: Secondary | ICD-10-CM

## 2019-10-18 LAB — CMP (CANCER CENTER ONLY)
ALT: 14 U/L (ref 0–44)
AST: 24 U/L (ref 15–41)
Albumin: 4.2 g/dL (ref 3.5–5.0)
Alkaline Phosphatase: 50 U/L (ref 38–126)
Anion gap: 10 (ref 5–15)
BUN: 22 mg/dL (ref 8–23)
CO2: 27 mmol/L (ref 22–32)
Calcium: 9.4 mg/dL (ref 8.9–10.3)
Chloride: 104 mmol/L (ref 98–111)
Creatinine: 1.6 mg/dL — ABNORMAL HIGH (ref 0.61–1.24)
GFR, Est AFR Am: 48 mL/min — ABNORMAL LOW (ref >60)
GFR, Estimated: 42 mL/min — ABNORMAL LOW (ref >60)
Glucose, Bld: 87 mg/dL (ref 70–99)
Potassium: 4.6 mmol/L (ref 3.5–5.1)
Sodium: 141 mmol/L (ref 135–145)
Total Bilirubin: 0.7 mg/dL (ref 0.3–1.2)
Total Protein: 6.5 g/dL (ref 6.5–8.1)

## 2019-10-18 LAB — CBC WITH DIFFERENTIAL (CANCER CENTER ONLY)
Abs Immature Granulocytes: 0.21 10*3/uL — ABNORMAL HIGH (ref 0.00–0.07)
Basophils Absolute: 0.1 10*3/uL (ref 0.0–0.1)
Basophils Relative: 0 %
Eosinophils Absolute: 0.4 10*3/uL (ref 0.0–0.5)
Eosinophils Relative: 0 %
HCT: 40.3 % (ref 39.0–52.0)
Hemoglobin: 12.7 g/dL — ABNORMAL LOW (ref 13.0–17.0)
Immature Granulocytes: 0 %
Lymphocytes Relative: 95 %
Lymphs Abs: 81.8 10*3/uL — ABNORMAL HIGH (ref 0.7–4.0)
MCH: 30.9 pg (ref 26.0–34.0)
MCHC: 31.5 g/dL (ref 30.0–36.0)
MCV: 98.1 fL (ref 80.0–100.0)
Monocytes Absolute: 1.6 10*3/uL — ABNORMAL HIGH (ref 0.1–1.0)
Monocytes Relative: 2 %
Neutro Abs: 2.8 10*3/uL (ref 1.7–7.7)
Neutrophils Relative %: 3 %
Platelet Count: 74 10*3/uL — ABNORMAL LOW (ref 150–400)
RBC: 4.11 MIL/uL — ABNORMAL LOW (ref 4.22–5.81)
RDW: 13.5 % (ref 11.5–15.5)
WBC Count: 86.8 10*3/uL (ref 4.0–10.5)
nRBC: 0 % (ref 0.0–0.2)

## 2019-10-18 LAB — PROTIME-INR
INR: 2.6 — ABNORMAL HIGH (ref 0.8–1.2)
Prothrombin Time: 27.8 seconds — ABNORMAL HIGH (ref 11.4–15.2)

## 2019-10-18 NOTE — Telephone Encounter (Signed)
Received call from lab stating pt's WBC is 86.8.  Phoned desk RN & left message.  Pt seeing MD today.  Will also notify MD

## 2019-10-18 NOTE — Progress Notes (Signed)
  [place] OFFICE PROGRESS NOTE   Diagnosis: CLL  INTERVAL HISTORY:   Chad Gross returns for scheduled visit.  He feels well.  Good appetite.  He reports intentional weight loss.  No fever.  No change in palpable lymph nodes.  No abdominal pain.  He bleeds easily with trauma.  No other bleeding.  He continues Coumadin anticoagulation.  Objective:  Vital signs in last 24 hours:  Blood pressure 110/68, pulse 79, temperature 97.9 F (36.6 C), temperature source Temporal, resp. rate 18, height 5\' 11"  (1.803 m), weight 209 lb (94.8 kg), SpO2 98 %.    Lymphatics: 1-2 cm low cervical, submental, and axillary nodes  GI: Firm fullness in the mid upper abdomen with mild associated tenderness.  No hepatosplenomegaly Vascular: The right lower leg is larger than the left side     Lab Results:  Lab Results  Component Value Date   WBC 86.8 (HH) 10/18/2019   HGB 12.7 (L) 10/18/2019   HCT 40.3 10/18/2019   MCV 98.1 10/18/2019   PLT 74 (L) 10/18/2019   NEUTROABS PENDING 10/18/2019    CMP  Lab Results  Component Value Date   NA 141 10/18/2019   K 4.6 10/18/2019   CL 104 10/18/2019   CO2 27 10/18/2019   GLUCOSE 87 10/18/2019   BUN 22 10/18/2019   CREATININE 1.60 (H) 10/18/2019   CALCIUM 9.4 10/18/2019   PROT 6.5 10/18/2019   ALBUMIN 4.2 10/18/2019   AST 24 10/18/2019   ALT 14 10/18/2019   ALKPHOS 50 10/18/2019   BILITOT 0.7 10/18/2019   GFRNONAA 42 (L) 10/18/2019   GFRAA 48 (L) 10/18/2019   Medications: I have reviewed the patient's current medications.   Assessment/Plan: 1. 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 He will return for a monthly PT check.  2. Indwelling IVC catheter.  3. 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  4. 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 erectile dysfunction-followed by Dr. [name] , status post  placement of a penile implant at [facility] in April 2014  7. CT of the abdomen/pelvis at [facility] urology February 2014 for evaluation of hematuria-12 mm external iliac nodes and leftperiaortic retroperitoneal lymph node-11 mm-potentially related to CLL 8.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, peripheral lymphocytosis and small palpable lymph nodes           CT abdomen/pelvis 09/18/2019-extensive abdominal pelvic lymphadenopathy including a left periaortic node measuring 5.7 cm    Disposition: Chad Gross appears unchanged.  He is stable from a hematologic standpoint.  There is stable palpable lymphadenopathy and he appears asymptomatic from the CLL.  There is no indication for treating the CLL at present.  The palpable fullness in the upper abdomen appears to be related to bulky periaortic adenopathy.  He is asymptomatic from this and there was no hydronephrosis on the CT.  I reviewed the CT images with him.  The plan is to continue observation.  He will continue Coumadin at current dose.  He will return for an office and lab visit in 6 weeks.  The creatinine is mildly elevated.  He is taking HCTZ and losartan.  We will forward the labs to Dr. Jenny Gross to consider further evaluation.  Chad Coder, MD  10/18/2019  10:57 AM

## 2019-10-18 NOTE — Telephone Encounter (Signed)
Scheduled per 11/19 los, patient will receive updates on My chart. °

## 2019-10-18 NOTE — Progress Notes (Signed)
CBC and CMP results faxed to Dr. Cathlean Cower at patient's request. Dr. Benay Spice requested to note on lab that creatinine is higher and he is on losartan and hctz.

## 2019-11-06 IMAGING — US US ABDOMEN COMPLETE
1 series · 14 of 25 positions shown · non-contrast
Comparison: None.

CLINICAL DATA: Chronic lymphocytic leukemia. Possible splenomegaly.

EXAM:
ABDOMEN ULTRASOUND COMPLETE

[Series 1: us abdomen complete · 0.26mm/px · 14 of 95 slices shown]
[im 1/95]
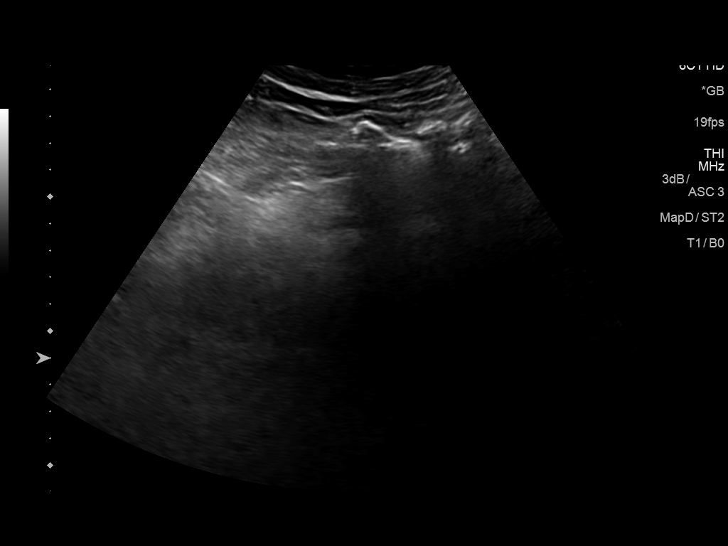
[im 8/95]
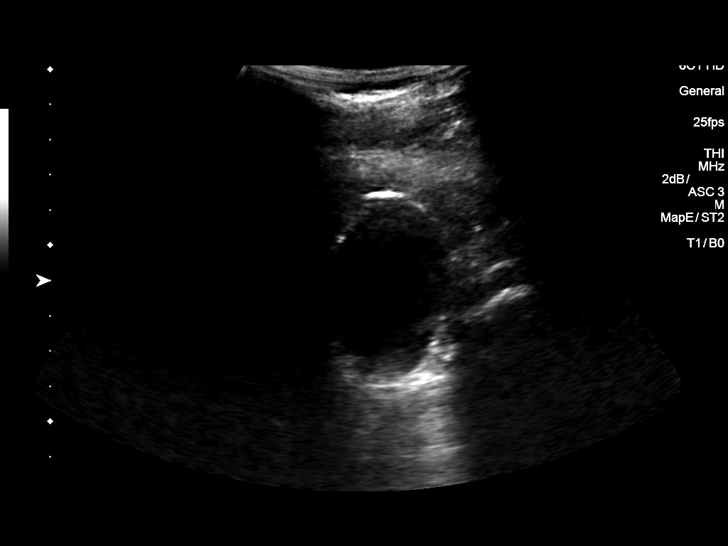
[im 16/95]
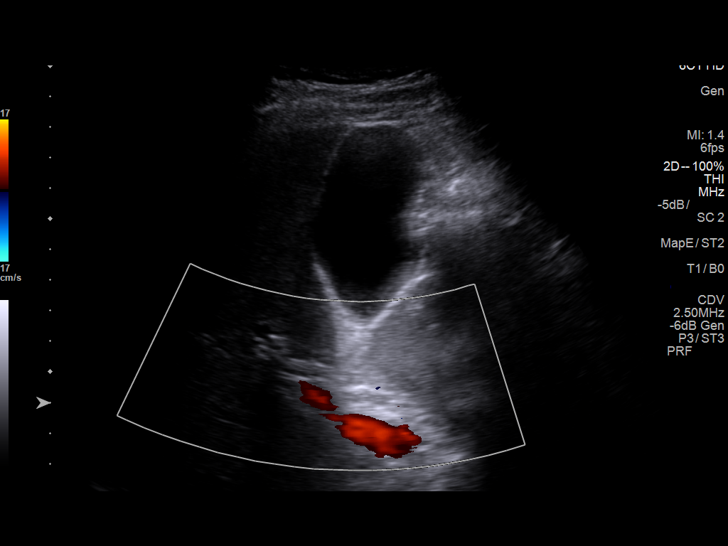
[im 24/95]
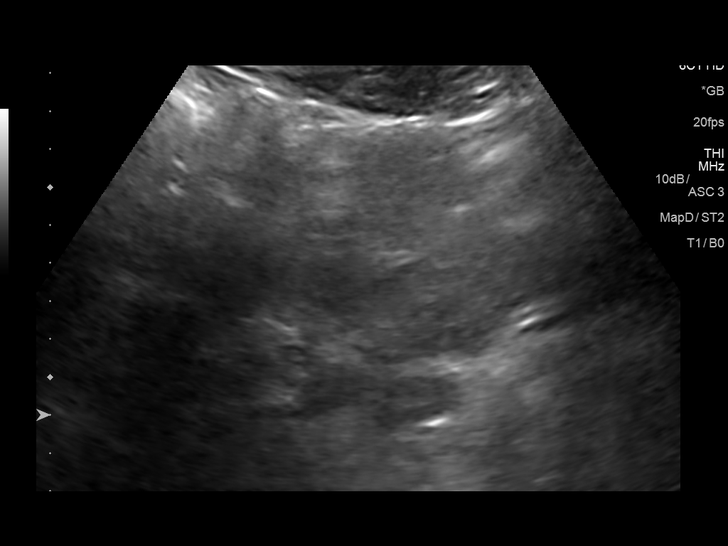
[im 32/95]
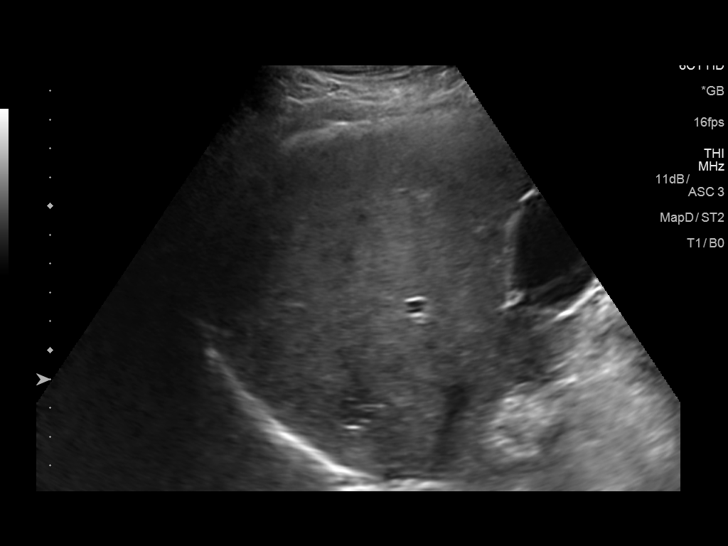
[im 36/95]
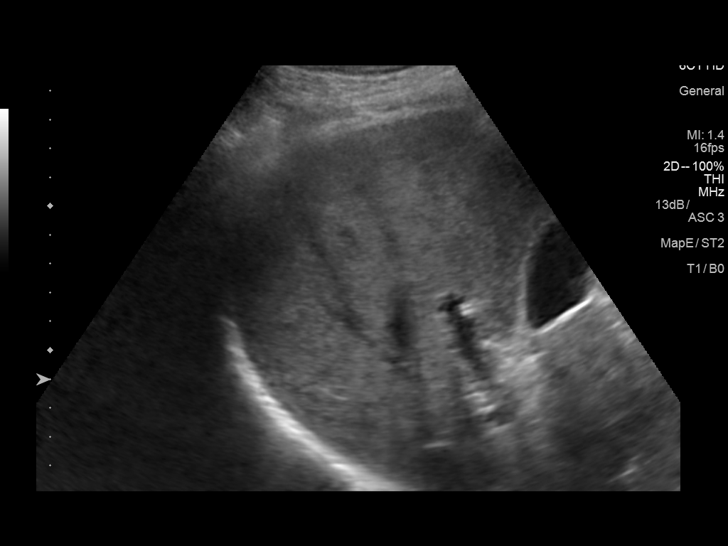
[im 44/95]
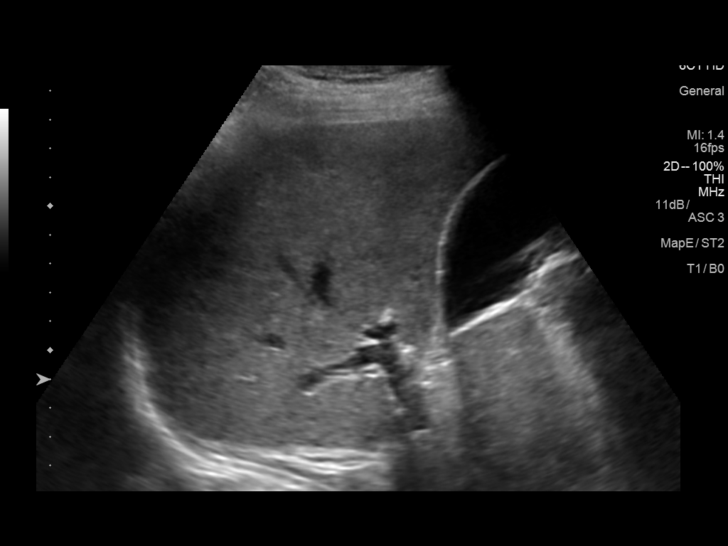
[im 51/95]
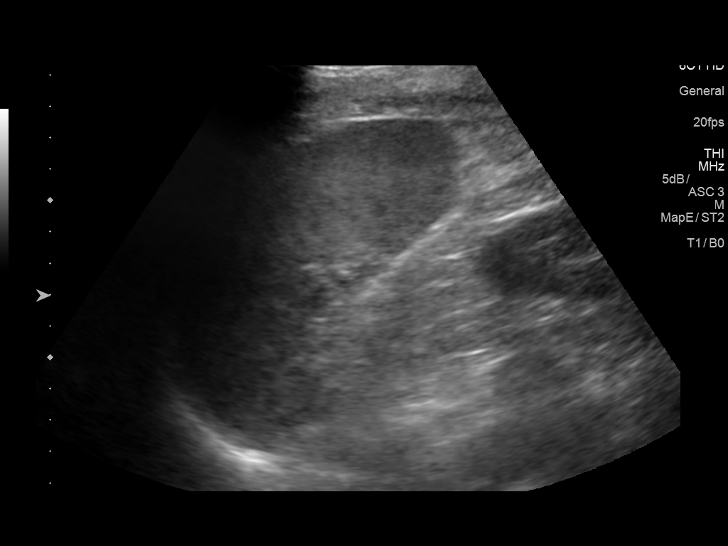
[im 59/95]
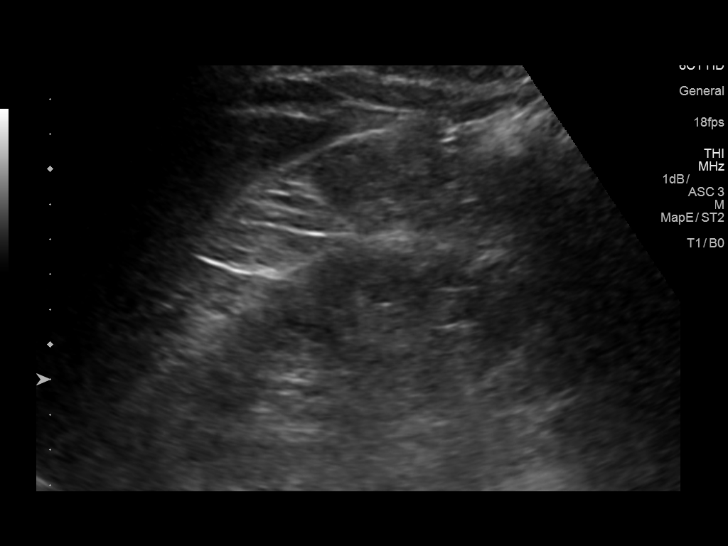
[im 63/95]
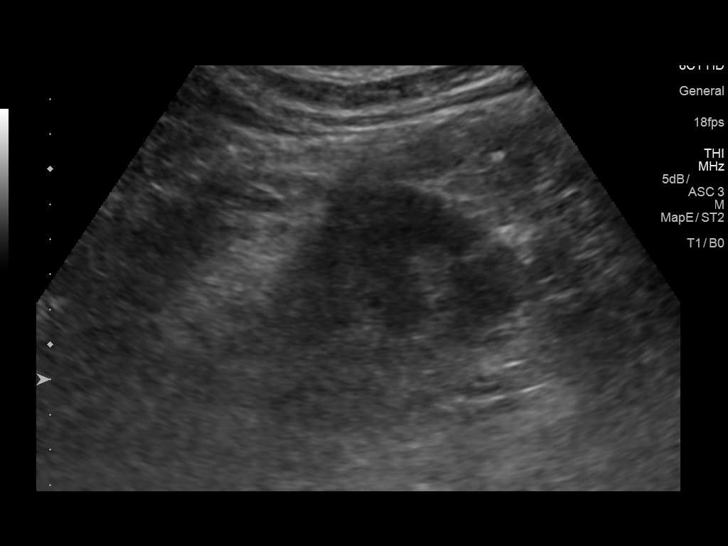
[im 71/95]
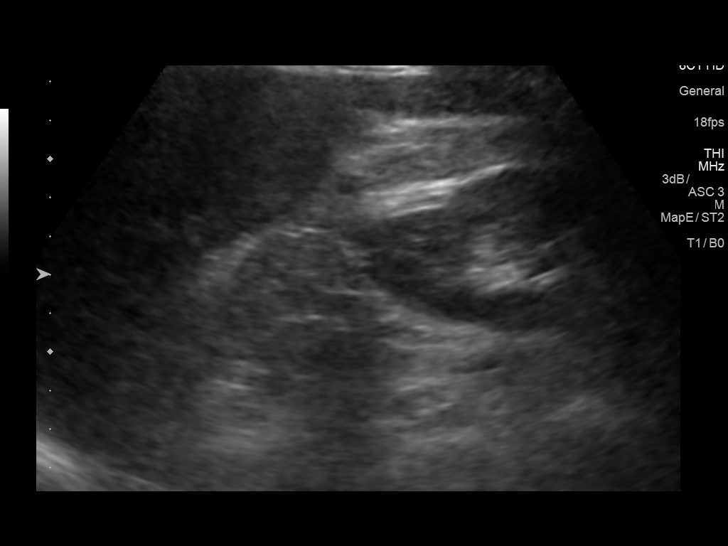
[im 79/95]
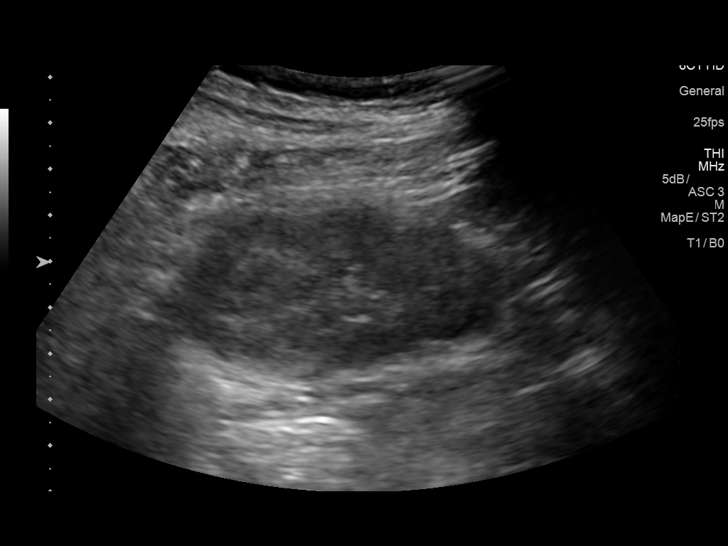
[im 87/95]
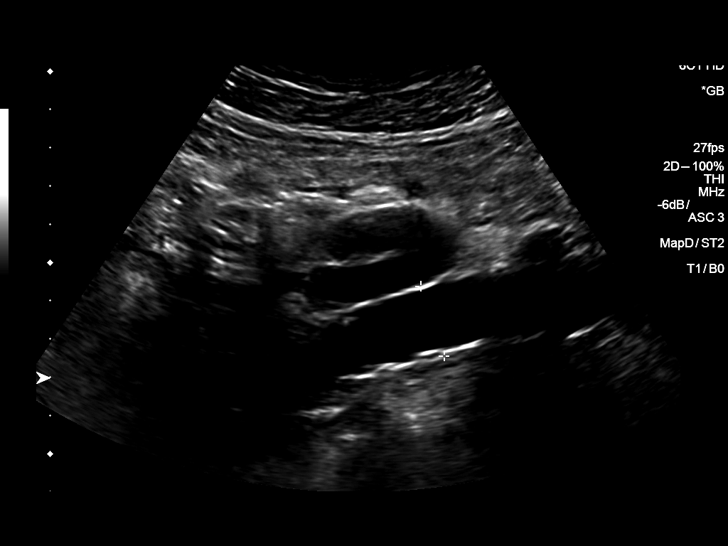
[im 95/95]
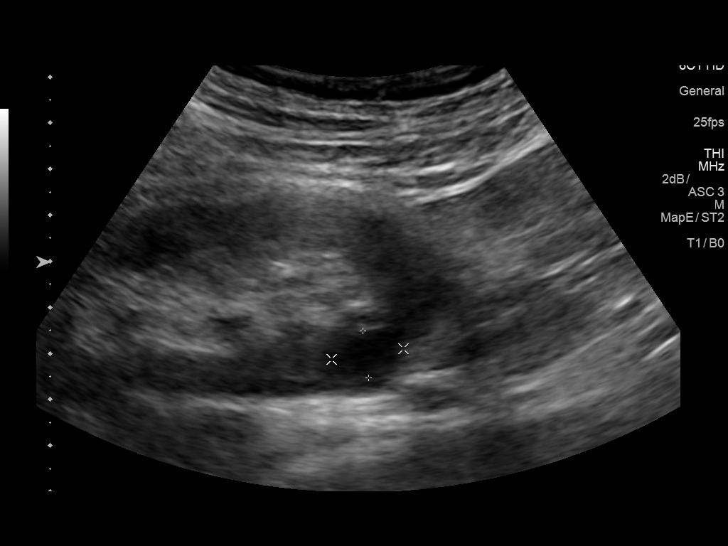

[14 of 25 positions shown; findings below may reference images not displayed]

FINDINGS: Gallbladder: No gallstones or wall thickening visualized. No
sonographic Murphy sign noted by sonographer.

Common bile duct: Diameter: 3.4 mm

Liver: No focal lesion identified. Within normal limits in
parenchymal echogenicity. Portal vein is patent on color Doppler
imaging with normal direction of blood flow towards the liver.

IVC: No abnormality visualized.

Pancreas: Visualized portion unremarkable.

Spleen: Size and appearance within normal limits.  Length of 7.7 cm.

Right Kidney: Length: 10.6 cm. Echogenicity within normal limits. No
mass or hydronephrosis visualized.

Left Kidney: Length: 11.2 cm. Incidental 1 cm lower pole renal cyst.
Echogenicity within normal limits. No mass or hydronephrosis
visualized.

Abdominal aorta: No aneurysm visualized.  AP diameter 2.8 cm.

Other findings: None.
IMPRESSION: Negative abdominal ultrasound.

## 2019-11-27 ENCOUNTER — Encounter: Payer: Self-pay | Admitting: Oncology

## 2019-11-27 ENCOUNTER — Other Ambulatory Visit: Payer: Self-pay | Admitting: Oncology

## 2019-11-27 ENCOUNTER — Other Ambulatory Visit: Payer: Self-pay

## 2019-11-27 ENCOUNTER — Inpatient Hospital Stay (HOSPITAL_BASED_OUTPATIENT_CLINIC_OR_DEPARTMENT_OTHER): Payer: MEDICARE | Admitting: Oncology

## 2019-11-27 ENCOUNTER — Inpatient Hospital Stay: Payer: MEDICARE | Attending: Oncology

## 2019-11-27 ENCOUNTER — Telehealth: Payer: Self-pay | Admitting: Oncology

## 2019-11-27 VITALS — BP 129/73 | HR 83 | Temp 97.9°F | Resp 18 | Ht 71.0 in | Wt 209.0 lb

## 2019-11-27 DIAGNOSIS — D696 Thrombocytopenia, unspecified: Secondary | ICD-10-CM | POA: Diagnosis not present

## 2019-11-27 DIAGNOSIS — Z7901 Long term (current) use of anticoagulants: Secondary | ICD-10-CM

## 2019-11-27 DIAGNOSIS — C9110 Chronic lymphocytic leukemia of B-cell type not having achieved remission: Secondary | ICD-10-CM | POA: Diagnosis present

## 2019-11-27 DIAGNOSIS — Z79899 Other long term (current) drug therapy: Secondary | ICD-10-CM | POA: Insufficient documentation

## 2019-11-27 DIAGNOSIS — N529 Male erectile dysfunction, unspecified: Secondary | ICD-10-CM | POA: Diagnosis not present

## 2019-11-27 DIAGNOSIS — I82409 Acute embolism and thrombosis of unspecified deep veins of unspecified lower extremity: Secondary | ICD-10-CM

## 2019-11-27 LAB — CBC WITH DIFFERENTIAL (CANCER CENTER ONLY)
Abs Immature Granulocytes: 0.15 10*3/uL — ABNORMAL HIGH (ref 0.00–0.07)
Basophils Absolute: 0.1 10*3/uL (ref 0.0–0.1)
Basophils Relative: 0 %
Eosinophils Absolute: 0.3 10*3/uL (ref 0.0–0.5)
Eosinophils Relative: 0 %
HCT: 40.1 % (ref 39.0–52.0)
Hemoglobin: 12.5 g/dL — ABNORMAL LOW (ref 13.0–17.0)
Immature Granulocytes: 0 %
Lymphocytes Relative: 96 %
Lymphs Abs: 89.8 10*3/uL — ABNORMAL HIGH (ref 0.7–4.0)
MCH: 31.5 pg (ref 26.0–34.0)
MCHC: 31.2 g/dL (ref 30.0–36.0)
MCV: 101 fL — ABNORMAL HIGH (ref 80.0–100.0)
Monocytes Absolute: 1.3 10*3/uL — ABNORMAL HIGH (ref 0.1–1.0)
Monocytes Relative: 1 %
Neutro Abs: 3.1 10*3/uL (ref 1.7–7.7)
Neutrophils Relative %: 3 %
Platelet Count: 80 10*3/uL — ABNORMAL LOW (ref 150–400)
RBC: 3.97 MIL/uL — ABNORMAL LOW (ref 4.22–5.81)
RDW: 14 % (ref 11.5–15.5)
WBC Count: 94.7 10*3/uL (ref 4.0–10.5)
nRBC: 0 % (ref 0.0–0.2)

## 2019-11-27 LAB — CMP (CANCER CENTER ONLY)
ALT: 11 U/L (ref 0–44)
AST: 19 U/L (ref 15–41)
Albumin: 4.1 g/dL (ref 3.5–5.0)
Alkaline Phosphatase: 52 U/L (ref 38–126)
Anion gap: 10 (ref 5–15)
BUN: 24 mg/dL — ABNORMAL HIGH (ref 8–23)
CO2: 27 mmol/L (ref 22–32)
Calcium: 9.2 mg/dL (ref 8.9–10.3)
Chloride: 105 mmol/L (ref 98–111)
Creatinine: 1.67 mg/dL — ABNORMAL HIGH (ref 0.61–1.24)
GFR, Est AFR Am: 46 mL/min — ABNORMAL LOW (ref >60)
GFR, Estimated: 40 mL/min — ABNORMAL LOW (ref >60)
Glucose, Bld: 103 mg/dL — ABNORMAL HIGH (ref 70–99)
Potassium: 4.2 mmol/L (ref 3.5–5.1)
Sodium: 142 mmol/L (ref 135–145)
Total Bilirubin: 0.6 mg/dL (ref 0.3–1.2)
Total Protein: 6.5 g/dL (ref 6.5–8.1)

## 2019-11-27 LAB — PROTIME-INR
INR: 2.6 — ABNORMAL HIGH (ref 0.8–1.2)
Prothrombin Time: 27.7 seconds — ABNORMAL HIGH (ref 11.4–15.2)

## 2019-11-27 LAB — LACTATE DEHYDROGENASE: LDH: 239 U/L — ABNORMAL HIGH (ref 98–192)

## 2019-11-27 NOTE — Progress Notes (Signed)
  [facility] OFFICE PROGRESS NOTE   Diagnosis: CLL, anticoagulation therapy  INTERVAL HISTORY:   Chad Gross returns as scheduled.  No fever, night sweats, or anorexia.  He has intermittent bleeding on his pillow from a let the left forehead.  No active bleeding.  No change in the abdominal fullness or palpable nodes.  Objective:  Vital signs in last 24 hours:  Blood pressure 129/73, pulse 83, temperature 97.9 F (36.6 C), temperature source Temporal, resp. rate 18, height 5\' 11"  (1.803 m), weight 209 lb (94.8 kg), SpO2 96 %.   Limited physical examination secondary to distancing with the Covid pandemic Lymphatics: 2-3 cm low posterior cervical nodes bilaterally, 1 cm submental nodes, 1-2 cm bilateral right greater than left, axillary nodes. GI: Masslike fullness in the mid upper abdomen with associated tenderness.  No hepatosplenomegaly Vascular: No leg edema Skin: 3-4 mm raised clear mole of the left lower posterior neck, 3-4 mm scab of the left forehead   Lab Results:  Lab Results  Component Value Date   WBC 94.7 (HH) 11/27/2019   HGB 12.5 (L) 11/27/2019   HCT 40.1 11/27/2019   MCV 101.0 (H) 11/27/2019   PLT 80 (L) 11/27/2019   NEUTROABS PENDING 11/27/2019    CMP  Lab Results  Component Value Date   NA 141 10/18/2019   K 4.6 10/18/2019   CL 104 10/18/2019   CO2 27 10/18/2019   GLUCOSE 87 10/18/2019   BUN 22 10/18/2019   CREATININE 1.60 (H) 10/18/2019   CALCIUM 9.4 10/18/2019   PROT 6.5 10/18/2019   ALBUMIN 4.2 10/18/2019   AST 24 10/18/2019   ALT 14 10/18/2019   ALKPHOS 50 10/18/2019   BILITOT 0.7 10/18/2019   GFRNONAA 42 (L) 10/18/2019   GFRAA 48 (L) 10/18/2019    Lab Results  Component Value Date   INR 2.6 (H) 11/27/2019    Medications: I have reviewed the patient's current medications.   Assessment/Plan: 1. 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 He will return for a monthly  PT check.  2. Indwelling IVC catheter.  3. 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  4. 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 erectile dysfunction-followed by Dr. Jeffie Pollock , status post placement of a penile implant at [facility] in April 2014  7. CT of the abdomen/pelvis at [facility] urology February 2014 for evaluation of hematuria-12 mm external iliac nodes and leftperiaortic retroperitoneal lymph node-11 mm-potentially related to CLL 8.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, peripheral lymphocytosis and small palpable lymph nodes           CT abdomen/pelvis 09/18/2019-extensive abdominal pelvic lymphadenopathy including a left periaortic node measuring 5.7 cm     Disposition: Chad Gross appears unchanged.  He is stable from a hematologic standpoint.  There is no indication for treating the CLL at present.  He will return for a CBC in 2 months.  The PT/INR is therapeutic.  He will continue Coumadin at current dose.  I made a dermatology referral to evaluate the skin lesions.  For increased bleeding at the left forehead lesion.  Betsy Coder, MD  11/27/2019  12:07 PM

## 2019-11-27 NOTE — Telephone Encounter (Signed)
Scheduled per los. Patient declined printout  

## 2019-11-28 ENCOUNTER — Encounter: Payer: Self-pay | Admitting: Internal Medicine

## 2019-12-16 ENCOUNTER — Encounter: Payer: Self-pay | Admitting: Oncology

## 2019-12-16 ENCOUNTER — Encounter: Payer: Self-pay | Admitting: Internal Medicine

## 2019-12-23 ENCOUNTER — Ambulatory Visit: Payer: MEDICARE | Attending: Internal Medicine

## 2019-12-23 DIAGNOSIS — Z23 Encounter for immunization: Secondary | ICD-10-CM | POA: Insufficient documentation

## 2019-12-23 NOTE — Progress Notes (Signed)
   Covid-19 Vaccination Clinic  Name:  Chad Gross    MRN: [id number] DOB: 22-Apr-1946  12/23/2019  [name] was observed post Covid-19 immunization for 15 minutes without incidence. He was provided with Vaccine Information Sheet and instruction to access the V-Safe system.   [name] was instructed to call 911 with any severe reactions post vaccine: Marland Kitchen Difficulty breathing  . Swelling of your face and throat  . A fast heartbeat  . A bad rash all over your body  . Dizziness and weakness    Immunizations Administered    Name Date Dose VIS Date Route   Pfizer COVID-19 Vaccine 12/23/2019 10:20 AM 0.3 mL 11/09/2019 Intramuscular   Manufacturer: [place]   Lot: [id number]   [place]: [id number]

## 2019-12-26 ENCOUNTER — Other Ambulatory Visit: Payer: Self-pay

## 2019-12-26 ENCOUNTER — Telehealth: Payer: Self-pay | Admitting: *Deleted

## 2019-12-26 ENCOUNTER — Inpatient Hospital Stay: Payer: MEDICARE | Attending: Oncology

## 2019-12-26 ENCOUNTER — Telehealth: Payer: Self-pay

## 2019-12-26 DIAGNOSIS — Z7901 Long term (current) use of anticoagulants: Secondary | ICD-10-CM

## 2019-12-26 DIAGNOSIS — C9110 Chronic lymphocytic leukemia of B-cell type not having achieved remission: Secondary | ICD-10-CM | POA: Diagnosis present

## 2019-12-26 LAB — PROTIME-INR
INR: 2.4 — ABNORMAL HIGH (ref 0.8–1.2)
Prothrombin Time: 26.4 seconds — ABNORMAL HIGH (ref 11.4–15.2)

## 2019-12-26 LAB — CBC WITH DIFFERENTIAL (CANCER CENTER ONLY)
Abs Immature Granulocytes: 0.2 10*3/uL — ABNORMAL HIGH (ref 0.00–0.07)
Basophils Absolute: 0 10*3/uL (ref 0.0–0.1)
Basophils Relative: 0 %
Eosinophils Absolute: 0.3 10*3/uL (ref 0.0–0.5)
Eosinophils Relative: 0 %
HCT: 38 % — ABNORMAL LOW (ref 39.0–52.0)
Hemoglobin: 11.9 g/dL — ABNORMAL LOW (ref 13.0–17.0)
Immature Granulocytes: 0 %
Lymphocytes Relative: 96 %
Lymphs Abs: 99 10*3/uL — ABNORMAL HIGH (ref 0.7–4.0)
MCH: 31.3 pg (ref 26.0–34.0)
MCHC: 31.3 g/dL (ref 30.0–36.0)
MCV: 100 fL (ref 80.0–100.0)
Monocytes Absolute: 1.4 10*3/uL — ABNORMAL HIGH (ref 0.1–1.0)
Monocytes Relative: 1 %
Neutro Abs: 2.8 10*3/uL (ref 1.7–7.7)
Neutrophils Relative %: 3 %
Platelet Count: 83 10*3/uL — ABNORMAL LOW (ref 150–400)
RBC: 3.8 MIL/uL — ABNORMAL LOW (ref 4.22–5.81)
RDW: 14.2 % (ref 11.5–15.5)
WBC Count: 103.8 10*3/uL (ref 4.0–10.5)
nRBC: 0 % (ref 0.0–0.2)

## 2019-12-26 LAB — BASIC METABOLIC PANEL - CANCER CENTER ONLY
Anion gap: 9 (ref 5–15)
BUN: 24 mg/dL — ABNORMAL HIGH (ref 8–23)
CO2: 27 mmol/L (ref 22–32)
Calcium: 8.9 mg/dL (ref 8.9–10.3)
Chloride: 105 mmol/L (ref 98–111)
Creatinine: 1.62 mg/dL — ABNORMAL HIGH (ref 0.61–1.24)
GFR, Est AFR Am: 48 mL/min — ABNORMAL LOW (ref >60)
GFR, Estimated: 41 mL/min — ABNORMAL LOW (ref >60)
Glucose, Bld: 75 mg/dL (ref 70–99)
Potassium: 4.5 mmol/L (ref 3.5–5.1)
Sodium: 141 mmol/L (ref 135–145)

## 2019-12-26 NOTE — Telephone Encounter (Addendum)
WBC 103.8 today. INR 2.4. Awaiting MD instructions.

## 2019-12-26 NOTE — Telephone Encounter (Signed)
Jennifer from lab called to report critical WBC of 103.8.  Information given to RN for Dr. Benay Spice.

## 2019-12-27 ENCOUNTER — Ambulatory Visit: Payer: MEDICARE

## 2019-12-27 ENCOUNTER — Telehealth: Payer: Self-pay

## 2019-12-27 NOTE — Telephone Encounter (Signed)
TC to pt per Dr Benay Spice to let him know that his cbc is stable, PT INR is therapeutic, continue same coumadin, f/u as scheduled. Patient verbalized understanding. No further problems or concerns at this time.

## 2020-01-04 ENCOUNTER — Encounter: Payer: Self-pay | Admitting: *Deleted

## 2020-01-07 ENCOUNTER — Encounter: Payer: Self-pay | Admitting: *Deleted

## 2020-01-07 ENCOUNTER — Ambulatory Visit: Payer: MEDICARE

## 2020-01-07 NOTE — Progress Notes (Signed)
Faxed referral with chart information to [facility] [phone].

## 2020-01-14 ENCOUNTER — Ambulatory Visit: Payer: MEDICARE | Attending: Internal Medicine

## 2020-01-14 DIAGNOSIS — Z23 Encounter for immunization: Secondary | ICD-10-CM | POA: Insufficient documentation

## 2020-01-14 NOTE — Progress Notes (Signed)
   Covid-19 Vaccination Clinic  Name:  Chad Gross    MRN: [id number] DOB: Mar 16, 1946  01/14/2020  [name] was observed post Covid-19 immunization for 15 minutes without incidence. He was provided with Vaccine Information Sheet and instruction to access the V-Safe system.   [name] was instructed to call 911 with any severe reactions post vaccine: Marland Kitchen Difficulty breathing  . Swelling of your face and throat  . A fast heartbeat  . A bad rash all over your body  . Dizziness and weakness    Immunizations Administered    Name Date Dose VIS Date Route   Pfizer COVID-19 Vaccine 01/14/2020 10:00 AM 0.3 mL 11/09/2019 Intramuscular   Manufacturer: [place]   Lot: [id number]   [place]: [id number]

## 2020-01-28 ENCOUNTER — Inpatient Hospital Stay: Payer: MEDICARE

## 2020-01-28 ENCOUNTER — Telehealth: Payer: Self-pay | Admitting: Oncology

## 2020-01-28 ENCOUNTER — Inpatient Hospital Stay: Payer: MEDICARE | Attending: Oncology | Admitting: Oncology

## 2020-01-28 ENCOUNTER — Other Ambulatory Visit: Payer: Self-pay

## 2020-01-28 VITALS — BP 116/70 | HR 86 | Temp 98.0°F | Resp 17 | Ht 71.0 in | Wt 208.9 lb

## 2020-01-28 DIAGNOSIS — Z7901 Long term (current) use of anticoagulants: Secondary | ICD-10-CM

## 2020-01-28 DIAGNOSIS — C44319 Basal cell carcinoma of skin of other parts of face: Secondary | ICD-10-CM | POA: Diagnosis not present

## 2020-01-28 DIAGNOSIS — Z79899 Other long term (current) drug therapy: Secondary | ICD-10-CM | POA: Insufficient documentation

## 2020-01-28 DIAGNOSIS — C9110 Chronic lymphocytic leukemia of B-cell type not having achieved remission: Secondary | ICD-10-CM | POA: Diagnosis present

## 2020-01-28 DIAGNOSIS — N529 Male erectile dysfunction, unspecified: Secondary | ICD-10-CM | POA: Diagnosis not present

## 2020-01-28 DIAGNOSIS — D696 Thrombocytopenia, unspecified: Secondary | ICD-10-CM | POA: Insufficient documentation

## 2020-01-28 LAB — CBC WITH DIFFERENTIAL (CANCER CENTER ONLY)
Abs Immature Granulocytes: 0.28 10*3/uL — ABNORMAL HIGH (ref 0.00–0.07)
Basophils Absolute: 0.1 10*3/uL (ref 0.0–0.1)
Basophils Relative: 0 %
Eosinophils Absolute: 0.3 10*3/uL (ref 0.0–0.5)
Eosinophils Relative: 0 %
HCT: 39.1 % (ref 39.0–52.0)
Hemoglobin: 12.2 g/dL — ABNORMAL LOW (ref 13.0–17.0)
Immature Granulocytes: 0 %
Lymphocytes Relative: 96 %
Lymphs Abs: 112.9 10*3/uL — ABNORMAL HIGH (ref 0.7–4.0)
MCH: 31.1 pg (ref 26.0–34.0)
MCHC: 31.2 g/dL (ref 30.0–36.0)
MCV: 99.7 fL (ref 80.0–100.0)
Monocytes Absolute: 2.8 10*3/uL — ABNORMAL HIGH (ref 0.1–1.0)
Monocytes Relative: 2 %
Neutro Abs: 2.8 10*3/uL (ref 1.7–7.7)
Neutrophils Relative %: 2 %
Platelet Count: 82 10*3/uL — ABNORMAL LOW (ref 150–400)
RBC: 3.92 MIL/uL — ABNORMAL LOW (ref 4.22–5.81)
RDW: 14.3 % (ref 11.5–15.5)
WBC Count: 119.2 10*3/uL (ref 4.0–10.5)
nRBC: 0 % (ref 0.0–0.2)

## 2020-01-28 LAB — PROTIME-INR
INR: 2.6 — ABNORMAL HIGH (ref 0.8–1.2)
Prothrombin Time: 27.8 seconds — ABNORMAL HIGH (ref 11.4–15.2)

## 2020-01-28 LAB — LACTATE DEHYDROGENASE: LDH: 255 U/L — ABNORMAL HIGH (ref 98–192)

## 2020-01-28 NOTE — Telephone Encounter (Signed)
Scheduled per los. Confirmed all appts. Patient decline printout

## 2020-01-28 NOTE — Progress Notes (Signed)
  Chad Gross OFFICE PROGRESS NOTE   Diagnosis: CLL, Coumadin anticoagulation  INTERVAL HISTORY:   Chad Gross returns as scheduled.  He feels well.  No bleeding or symptom of thrombosis.  No fever.  Good appetite.  No change in the palpable lymph nodes.  No abdominal pain.  He has received both doses of the COVID-19 vaccine.  He recently had a basal cell carcinoma removed from the left frontoparietal scalp.  He is scheduled for Mohs surgery in this area.  Objective:  Vital signs in last 24 hours:  Blood pressure 116/70, pulse 86, temperature 98 F (36.7 C), temperature source Temporal, resp. rate 17, height 5\' 11"  (1.803 m), weight 208 lb 14.4 oz (94.8 kg), SpO2 99 %.   Lymphatics: Soft mobile bilateral cervical, axillary, and inguinal nodes.  The largest nodes are approximately 2-3 cm low cervical nodes. GI: No hepatosplenomegaly, upper midline abdominal mass Vascular: The right lower leg is larger than the left side, no edema     Lab Results:  Lab Results  Component Value Date   WBC 119.2 (HH) 01/28/2020   HGB 12.2 (L) 01/28/2020   HCT 39.1 01/28/2020   MCV 99.7 01/28/2020   PLT 82 (L) 01/28/2020   NEUTROABS 2.8 01/28/2020    CMP  Lab Results  Component Value Date   NA 141 12/26/2019   K 4.5 12/26/2019   CL 105 12/26/2019   CO2 27 12/26/2019   GLUCOSE 75 12/26/2019   BUN 24 (H) 12/26/2019   CREATININE 1.62 (H) 12/26/2019   CALCIUM 8.9 12/26/2019   PROT 6.5 11/27/2019   ALBUMIN 4.1 11/27/2019   AST 19 11/27/2019   ALT 11 11/27/2019   ALKPHOS 52 11/27/2019   BILITOT 0.6 11/27/2019   GFRNONAA 41 (L) 12/26/2019   GFRAA 48 (L) 12/26/2019    No results found for: CEA1  Lab Results  Component Value Date   INR 2.6 (H) 01/28/2020        Assessment/Plan: 1. 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 He will return for a monthly PT check.  2. Indwelling IVC catheter.  3. Chronic stasis  change of the low legs, on the right greater than left.  4. 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 erectile dysfunction-followed by Dr. Jeffie Pollock , status post placement of a penile implant at [facility] in April 2014  7. CT of the abdomen/pelvis at [facility] urology February 2014 for evaluation of hematuria-12 mm external iliac nodes and leftperiaortic retroperitoneal lymph node-11 mm-potentially related to CLL 8.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, peripheral lymphocytosis and small palpable lymph nodes           CT abdomen/pelvis 09/18/2019-extensive abdominal pelvic lymphadenopathy including a left periaortic node measuring 5.7 cm     9.  Basal cell carcinoma removed from the left superior central forehead 01/21/2020   Disposition: He appears unchanged.  The PT/INR is therapeutic.  He will continue Coumadin at current dose.  Chad Gross appears asymptomatic from CLL.  He has stable mild thrombocytopenia.  We discussed treatment options for if/when he develops symptomatic disease.  We discussed acalabrutinib and bendamustine/rituximab.  The plan is to continue observation for now.  He will return for a monthly CBC and PT/INR.  He will be scheduled for a 74-month office visit.  Betsy Coder, MD  01/28/2020  1:14 PM

## 2020-01-28 NOTE — Progress Notes (Signed)
12:15pm Received critical lab value - WBC 119.2 Verbally communicated with repeat back to S. Michaelle Copas, Therapist, sports.

## 2020-02-06 ENCOUNTER — Telehealth: Payer: Self-pay | Admitting: Oncology

## 2020-02-06 NOTE — Telephone Encounter (Signed)
Rescheduled 6/3 appt to 6/15 per provider PAL. Pt is aware of new appt date and time.

## 2020-02-11 ENCOUNTER — Other Ambulatory Visit: Payer: Self-pay | Admitting: Oncology

## 2020-02-11 ENCOUNTER — Other Ambulatory Visit: Payer: Self-pay | Admitting: Internal Medicine

## 2020-02-11 DIAGNOSIS — Z7901 Long term (current) use of anticoagulants: Secondary | ICD-10-CM

## 2020-02-15 ENCOUNTER — Encounter: Payer: Self-pay | Admitting: Oncology

## 2020-02-15 ENCOUNTER — Telehealth: Payer: Self-pay | Admitting: Oncology

## 2020-02-15 NOTE — Telephone Encounter (Signed)
Called pt per 3/19 sch message - unable to reach pt . Left message for pt to call back if reschedule still needed

## 2020-02-20 ENCOUNTER — Ambulatory Visit (INDEPENDENT_AMBULATORY_CARE_PROVIDER_SITE_OTHER): Payer: MEDICARE | Admitting: Internal Medicine

## 2020-02-20 ENCOUNTER — Other Ambulatory Visit: Payer: Self-pay

## 2020-02-20 ENCOUNTER — Encounter: Payer: Self-pay | Admitting: Internal Medicine

## 2020-02-20 ENCOUNTER — Other Ambulatory Visit: Payer: Self-pay | Admitting: Internal Medicine

## 2020-02-20 VITALS — BP 144/82 | HR 70 | Temp 98.2°F | Ht 71.0 in | Wt 205.0 lb

## 2020-02-20 DIAGNOSIS — E559 Vitamin D deficiency, unspecified: Secondary | ICD-10-CM

## 2020-02-20 DIAGNOSIS — E785 Hyperlipidemia, unspecified: Secondary | ICD-10-CM

## 2020-02-20 DIAGNOSIS — Z125 Encounter for screening for malignant neoplasm of prostate: Secondary | ICD-10-CM

## 2020-02-20 DIAGNOSIS — I10 Essential (primary) hypertension: Secondary | ICD-10-CM | POA: Diagnosis not present

## 2020-02-20 DIAGNOSIS — Z0001 Encounter for general adult medical examination with abnormal findings: Secondary | ICD-10-CM

## 2020-02-20 DIAGNOSIS — K645 Perianal venous thrombosis: Secondary | ICD-10-CM | POA: Diagnosis not present

## 2020-02-20 DIAGNOSIS — N183 Chronic kidney disease, stage 3 unspecified: Secondary | ICD-10-CM | POA: Diagnosis not present

## 2020-02-20 DIAGNOSIS — E538 Deficiency of other specified B group vitamins: Secondary | ICD-10-CM | POA: Diagnosis not present

## 2020-02-20 LAB — URINALYSIS, ROUTINE W REFLEX MICROSCOPIC
Bilirubin Urine: NEGATIVE
Hgb urine dipstick: NEGATIVE
Ketones, ur: NEGATIVE
Leukocytes,Ua: NEGATIVE
Nitrite: NEGATIVE
RBC / HPF: NONE SEEN (ref 0–?)
Specific Gravity, Urine: >=1.03 — AB (ref 1.000–1.030)
Total Protein, Urine: NEGATIVE
Urine Glucose: NEGATIVE
Urobilinogen, UA: 0.2 (ref 0.0–1.0)
pH: 5 (ref 5.0–8.0)

## 2020-02-20 LAB — LIPID PANEL
Cholesterol: 142 mg/dL (ref 0–200)
HDL: 33 mg/dL — ABNORMAL LOW (ref >39.00)
LDL Cholesterol: 74 mg/dL (ref 0–99)
NonHDL: 109.28
Total CHOL/HDL Ratio: 4
Triglycerides: 176 mg/dL — ABNORMAL HIGH (ref 0.0–149.0)
VLDL: 35.2 mg/dL (ref 0.0–40.0)

## 2020-02-20 LAB — PSA: PSA: 0.78 ng/mL (ref 0.10–4.00)

## 2020-02-20 LAB — TSH: TSH: 5.24 u[IU]/mL — ABNORMAL HIGH (ref 0.35–4.50)

## 2020-02-20 LAB — VITAMIN D 25 HYDROXY (VIT D DEFICIENCY, FRACTURES): VITD: 49.89 ng/mL (ref 30.00–100.00)

## 2020-02-20 LAB — VITAMIN B12: Vitamin B-12: 689 pg/mL (ref 211–911)

## 2020-02-20 MED ORDER — LOSARTAN POTASSIUM 50 MG PO TABS: 50.0000 mg | ORAL_TABLET | Freq: Every day | ORAL | 3 refills | Status: DC

## 2020-02-20 MED ORDER — TRAMADOL HCL 50 MG PO TABS: ORAL_TABLET | ORAL | 5 refills | Status: DC

## 2020-02-20 MED ORDER — LEVOCETIRIZINE DIHYDROCHLORIDE 5 MG PO TABS: ORAL_TABLET | ORAL | 3 refills | Status: DC

## 2020-02-20 MED ORDER — PRAVASTATIN SODIUM 40 MG PO TABS: 40.0000 mg | ORAL_TABLET | Freq: Every day | ORAL | 3 refills | Status: DC

## 2020-02-20 MED ORDER — HYDROCORTISONE (PERIANAL) 2.5 % EX CREA: 1.0000 "application " | TOPICAL_CREAM | Freq: Two times a day (BID) | CUTANEOUS | 0 refills | Status: DC

## 2020-02-20 MED ORDER — HYDROCHLOROTHIAZIDE 12.5 MG PO TABS: 12.5000 mg | ORAL_TABLET | Freq: Every day | ORAL | 3 refills | Status: DC

## 2020-02-20 MED ORDER — MECLIZINE HCL 12.5 MG PO TABS: 12.5000 mg | ORAL_TABLET | Freq: Three times a day (TID) | ORAL | 1 refills | Status: AC | PRN

## 2020-02-20 NOTE — Assessment & Plan Note (Signed)
stable overall by history and exam, recent data reviewed with pt, and pt to continue medical treatment as before,  to f/u any worsening symptoms or concerns  

## 2020-02-20 NOTE — Progress Notes (Addendum)
Subjective:    Patient ID: Chad Gross, male    DOB: [birth year], 74 y.o.   MRN: [id number]  HPI  Here for wellness and f/u;  Overall doing ok;  Pt denies Chest pain, worsening SOB, DOE, wheezing, orthopnea, PND, worsening LE edema, palpitations, dizziness or syncope.  Pt denies neurological change such as new headache, facial or extremity weakness.  Pt denies polydipsia, polyuria, or low sugar symptoms. Pt states overall good compliance with treatment and medications, good tolerability, and has been trying to follow appropriate diet.  Pt denies worsening depressive symptoms, suicidal ideation or panic. No fever, night sweats, wt loss, loss of appetite, or other constitutional symptoms.  Pt states good ability with ADL's, has low fall risk, home safety reviewed and adequate, no other significant changes in hearing or vision, and only occasionally active with exercise BP at home < 140/90. BP Readings from Last 3 Encounters:  02/20/20 (!) 144/82  01/28/20 116/70  11/27/19 129/73  Also with 4 days of rectal pain, no bleeding, mild, no radiation, can feel a knot with tender, constant, , worse to sit.  Also had skin cancer removed - melanoma left forehead due for wide excision in April 2021   Past Medical History:  Diagnosis Date  . Chronic foot pain, right 02/07/2018  . CLL (chronic lymphocytic leukemia) ([place]) 02/07/2018  . ED (erectile dysfunction)   . History of DVT (deep vein thrombosis)   . Hyperlipidemia   . Hypertension   . Pericarditis   . Pulmonary embolism [facility])    Past Surgical History:  Procedure Laterality Date  . CARDIAC CATHETERIZATION  01/21/2011   EF 50-55%  . GREENFIELD FLITER PLACEMENT    . INGUINAL HERNIA REPAIR     BILATERAL  . KNEE ARTHROSCOPY Right 05/04/2018   Procedure: ARTHROSCOPY KNEE, PARTIAL MENISCECTOMY AND CHONDROPLASTY;  Surgeon: Melrose Nakayama, MD;  Location: [facility];  Service: Orthopedics;  Laterality: Right;    reports that he has never smoked. He has  never used smokeless tobacco. He reports current alcohol use. He reports that he does not use drugs. family history includes Breast cancer in his mother; Heart failure in his father; Hypertension in his father and mother; Pneumonia in his father. No Known Allergies Current Outpatient Medications on File Prior to Visit  Medication Sig Dispense Refill  . acetaminophen (TYLENOL) 500 MG tablet Take 500-1,000 mg by mouth every 6 (six) hours as needed for moderate pain or headache.    . Calcium-Magnesium-Zinc 167-83-8 MG TABS Take by mouth.    . Coenzyme Q10 (COQ10) 100 MG CAPS Take 100 mg by mouth daily.    Marland Kitchen GLUCOSAMINE CHONDROITIN COMPLX PO Take 1 tablet by mouth daily.    . Multiple Vitamins-Minerals (SENIOR MULTIVITAMIN PLUS PO) Take 1 tablet by mouth at bedtime.    Marland Kitchen warfarin (COUMADIN) 10 MG tablet TAKE 1 TABLET (10 MG TOTAL) BY MOUTH DAILY AT 6 PM. TAKE 1 TABLET DAILY AS DIRECTED. 90 tablet 3  . warfarin (COUMADIN) 2.5 MG tablet Take 1 tablet (2.5 mg total) by mouth every Monday, Wednesday, and Friday. For total dose of 12.5mg  on MWF. 36 tablet 3  . clindamycin (CLEOCIN T) 1 % lotion Apply 1 application topically 2 (two) times daily.    Marland Kitchen PRESCRIPTION MEDICATION Apply topically 2 (two) times daily. Fluorouracil 5% + Calcipotriene 0.005%--apply to face     No current facility-administered medications on file prior to visit.   Review of Systems All otherwise neg per pt  Objective:   Physical Exam BP (!) 144/82   Pulse 70   Temp 98.2 F (36.8 C)   Ht 5\' 11"  (1.803 m)   Wt 205 lb (93 kg)   SpO2 98%   BMI 28.59 kg/m  VS noted,  Constitutional: Pt appears in NAD HENT: Head: NCAT.  Right Ear: External ear normal.  Left Ear: External ear normal.  Eyes: . Pupils are equal, round, and reactive to light. Conjunctivae and EOM are normal Nose: without d/c or deformity Neck: Neck supple. Gross normal ROM Cardiovascular: Normal rate and regular rhythm.   Pulmonary/Chest: Effort normal  and breath sounds without rales or wheezing.  Abd:  Soft, NT, ND, + BS, no organomegaly Neurological: Pt is alert. At baseline orientation, motor grossly intact Skin: Skin is warm. No rashes, other new lesions, no LE edema Psychiatric: Pt behavior is normal without agitation  REctal + 1/2 cm thrombosed ext hemorrhoid without bleeding Lab Results  Component Value Date   WBC 119.2 (HH) 01/28/2020   HGB 12.2 (L) 01/28/2020   HCT 39.1 01/28/2020   PLT 82 (L) 01/28/2020   GLUCOSE 75 12/26/2019   CHOL 142 02/20/2020   TRIG 176.0 (H) 02/20/2020   HDL 33.00 (L) 02/20/2020   LDLDIRECT 71.0 02/16/2019   LDLCALC 74 02/20/2020   ALT 11 11/27/2019   AST 19 11/27/2019   NA 141 12/26/2019   K 4.5 12/26/2019   CL 105 12/26/2019   CREATININE 1.62 (H) 12/26/2019   BUN 24 (H) 12/26/2019   CO2 27 12/26/2019   TSH 5.24 (H) 02/20/2020   PSA 0.78 02/20/2020   INR 2.6 (H) 01/28/2020          Assessment & Plan:

## 2020-02-20 NOTE — Assessment & Plan Note (Addendum)
Mild to mod, for topical steroid,,  to f/u any worsening symptoms or concerns  .I spent 32 minutes in preparing to see the patient by review of recent labs, imaging and procedures, obtaining and reviewing separately obtained history, communicating with the patient and family or caregiver, ordering medications, tests or procedures, and documenting clinical information in the EHR including the differential Dx, treatment, and any further evaluation and other management of hemorrhoid, ckD, HTN< HLD<

## 2020-02-20 NOTE — Assessment & Plan Note (Signed)

## 2020-02-20 NOTE — Assessment & Plan Note (Signed)
Mild elevated, declines med change

## 2020-02-20 NOTE — Patient Instructions (Addendum)
Please take all new medication as prescribed - the cream  Please continue all other medications as before, and refills have been done if requested.  Please have the pharmacy call with any other refills you may need.  Please continue your efforts at being more active, low cholesterol diet, and weight control.  You are otherwise up to date with prevention measures today.  Please keep your appointments with your specialists as you may have planned  Please go to the LAB at the blood drawing area for the tests to be done  You will be contacted by phone if any changes need to be made immediately.  Otherwise, you will receive a letter about your results with an explanation, but please check with MyChart first.  Please remember to sign up for MyChart if you have not done so, as this will be important to you in the future with finding out test results, communicating by private email, and scheduling acute appointments online when needed.  Please make an Appointment to return for your 1 year visit, or sooner if needed

## 2020-03-03 ENCOUNTER — Inpatient Hospital Stay: Payer: MEDICARE | Attending: Oncology

## 2020-03-03 ENCOUNTER — Telehealth: Payer: Self-pay | Admitting: *Deleted

## 2020-03-03 ENCOUNTER — Other Ambulatory Visit: Payer: Self-pay

## 2020-03-03 DIAGNOSIS — C9110 Chronic lymphocytic leukemia of B-cell type not having achieved remission: Secondary | ICD-10-CM | POA: Diagnosis present

## 2020-03-03 LAB — CBC WITH DIFFERENTIAL (CANCER CENTER ONLY)
Abs Immature Granulocytes: 0.25 10*3/uL — ABNORMAL HIGH (ref 0.00–0.07)
Basophils Absolute: 0.1 10*3/uL (ref 0.0–0.1)
Basophils Relative: 0 %
Eosinophils Absolute: 0.3 10*3/uL (ref 0.0–0.5)
Eosinophils Relative: 0 %
HCT: 35.4 % — ABNORMAL LOW (ref 39.0–52.0)
Hemoglobin: 10.9 g/dL — ABNORMAL LOW (ref 13.0–17.0)
Immature Granulocytes: 0 %
Lymphocytes Relative: 95 %
Lymphs Abs: 105 10*3/uL — ABNORMAL HIGH (ref 0.7–4.0)
MCH: 31.1 pg (ref 26.0–34.0)
MCHC: 30.8 g/dL (ref 30.0–36.0)
MCV: 100.9 fL — ABNORMAL HIGH (ref 80.0–100.0)
Monocytes Absolute: 2.7 10*3/uL — ABNORMAL HIGH (ref 0.1–1.0)
Monocytes Relative: 2 %
Neutro Abs: 2.8 10*3/uL (ref 1.7–7.7)
Neutrophils Relative %: 3 %
Platelet Count: 91 10*3/uL — ABNORMAL LOW (ref 150–400)
RBC: 3.51 MIL/uL — ABNORMAL LOW (ref 4.22–5.81)
RDW: 14.8 % (ref 11.5–15.5)
WBC Count: 111 10*3/uL (ref 4.0–10.5)
nRBC: 0 % (ref 0.0–0.2)

## 2020-03-03 LAB — PROTIME-INR
INR: 3 — ABNORMAL HIGH (ref 0.8–1.2)
Prothrombin Time: 31 seconds — ABNORMAL HIGH (ref 11.4–15.2)

## 2020-03-03 NOTE — Telephone Encounter (Signed)
MD review of INR/CBC today. Same coumadin and recheck this and CBC on 03/31/20. Left results and instructions on his voice mail.

## 2020-03-03 NOTE — Telephone Encounter (Addendum)
CRITICAL VALUE STICKER  CRITICAL VALUE: WBC 111.0   RECEIVER (on-site recipient of call): Manuela Schwartz, [place] NOTIFIED: 03/03/20 @ 1438   MESSENGER (representative from lab): Deidre Ala   MD NOTIFIED: Dr. Benay Spice  TIME OF NOTIFICATION: [id number]  RESPONSE: F/U as scheduled (stable for him). Will recheck on 03/31/20

## 2020-03-08 ENCOUNTER — Encounter: Payer: Self-pay | Admitting: Internal Medicine

## 2020-03-12 ENCOUNTER — Other Ambulatory Visit: Payer: Self-pay | Admitting: *Deleted

## 2020-03-12 ENCOUNTER — Encounter: Payer: Self-pay | Admitting: Oncology

## 2020-03-12 DIAGNOSIS — Z7901 Long term (current) use of anticoagulants: Secondary | ICD-10-CM

## 2020-03-12 DIAGNOSIS — I82409 Acute embolism and thrombosis of unspecified deep veins of unspecified lower extremity: Secondary | ICD-10-CM

## 2020-03-12 MED ORDER — WARFARIN SODIUM 2.5 MG PO TABS: 2.5000 mg | ORAL_TABLET | ORAL | 3 refills | Status: DC

## 2020-03-28 ENCOUNTER — Encounter: Payer: Self-pay | Admitting: Oncology

## 2020-03-28 ENCOUNTER — Telehealth: Payer: Self-pay | Admitting: Oncology

## 2020-03-28 NOTE — Telephone Encounter (Signed)
Called pt to r/s appt - no answer . Left message with appt date and time per 4/30 sch message -

## 2020-03-31 ENCOUNTER — Inpatient Hospital Stay: Payer: MEDICARE

## 2020-04-02 ENCOUNTER — Telehealth: Payer: Self-pay | Admitting: *Deleted

## 2020-04-02 ENCOUNTER — Other Ambulatory Visit: Payer: Self-pay

## 2020-04-02 ENCOUNTER — Inpatient Hospital Stay: Payer: MEDICARE | Attending: Oncology

## 2020-04-02 DIAGNOSIS — C9110 Chronic lymphocytic leukemia of B-cell type not having achieved remission: Secondary | ICD-10-CM | POA: Insufficient documentation

## 2020-04-02 LAB — CBC WITH DIFFERENTIAL (CANCER CENTER ONLY)
Abs Immature Granulocytes: 0.26 10*3/uL — ABNORMAL HIGH (ref 0.00–0.07)
Basophils Absolute: 0.1 10*3/uL (ref 0.0–0.1)
Basophils Relative: 0 %
Eosinophils Absolute: 0.3 10*3/uL (ref 0.0–0.5)
Eosinophils Relative: 0 %
HCT: 37.6 % — ABNORMAL LOW (ref 39.0–52.0)
Hemoglobin: 11.4 g/dL — ABNORMAL LOW (ref 13.0–17.0)
Immature Granulocytes: 0 %
Lymphocytes Relative: 96 %
Lymphs Abs: 128.3 10*3/uL — ABNORMAL HIGH (ref 0.7–4.0)
MCH: 31.1 pg (ref 26.0–34.0)
MCHC: 30.3 g/dL (ref 30.0–36.0)
MCV: 102.7 fL — ABNORMAL HIGH (ref 80.0–100.0)
Monocytes Absolute: 2.3 10*3/uL — ABNORMAL HIGH (ref 0.1–1.0)
Monocytes Relative: 2 %
Neutro Abs: 2.8 10*3/uL (ref 1.7–7.7)
Neutrophils Relative %: 2 %
Platelet Count: 79 10*3/uL — ABNORMAL LOW (ref 150–400)
RBC: 3.66 MIL/uL — ABNORMAL LOW (ref 4.22–5.81)
RDW: 14.7 % (ref 11.5–15.5)
WBC Count: 134.1 10*3/uL (ref 4.0–10.5)
nRBC: 0 % (ref 0.0–0.2)

## 2020-04-02 LAB — CMP (CANCER CENTER ONLY)
ALT: 11 U/L (ref 0–44)
AST: 21 U/L (ref 15–41)
Albumin: 4.1 g/dL (ref 3.5–5.0)
Alkaline Phosphatase: 51 U/L (ref 38–126)
Anion gap: 10 (ref 5–15)
BUN: 19 mg/dL (ref 8–23)
CO2: 25 mmol/L (ref 22–32)
Calcium: 8.9 mg/dL (ref 8.9–10.3)
Chloride: 107 mmol/L (ref 98–111)
Creatinine: 1.54 mg/dL — ABNORMAL HIGH (ref 0.61–1.24)
GFR, Est AFR Am: 51 mL/min — ABNORMAL LOW (ref >60)
GFR, Estimated: 44 mL/min — ABNORMAL LOW (ref >60)
Glucose, Bld: 84 mg/dL (ref 70–99)
Potassium: 4.4 mmol/L (ref 3.5–5.1)
Sodium: 142 mmol/L (ref 135–145)
Total Bilirubin: 0.5 mg/dL (ref 0.3–1.2)
Total Protein: 6.4 g/dL — ABNORMAL LOW (ref 6.5–8.1)

## 2020-04-02 LAB — PROTIME-INR
INR: 3.1 — ABNORMAL HIGH (ref 0.8–1.2)
Prothrombin Time: 30.6 seconds — ABNORMAL HIGH (ref 11.4–15.2)

## 2020-04-02 NOTE — Telephone Encounter (Signed)
-----   Message from Ladell Pier, MD sent at 04/02/2020  3:49 PM EDT ----- Please call patient, same coumadin, call for bleeding, f/u as scheduled Platelets and hb stable

## 2020-04-02 NOTE — Telephone Encounter (Signed)
Notified patient of CBC results and that MD considers this stable. Continue same coumadin 10 mg qd and 12.5mg  MWF. Recheck in 1 month. Wants MD aware that he is having more night sweats and has lost 8-10 lb recently although this has been intentional. MD notified.

## 2020-04-03 ENCOUNTER — Encounter: Payer: Self-pay | Admitting: *Deleted

## 2020-05-01 ENCOUNTER — Other Ambulatory Visit: Payer: MEDICARE

## 2020-05-01 ENCOUNTER — Ambulatory Visit: Payer: MEDICARE | Admitting: Oncology

## 2020-05-05 ENCOUNTER — Inpatient Hospital Stay (HOSPITAL_BASED_OUTPATIENT_CLINIC_OR_DEPARTMENT_OTHER): Payer: MEDICARE | Admitting: Oncology

## 2020-05-05 ENCOUNTER — Other Ambulatory Visit: Payer: Self-pay

## 2020-05-05 ENCOUNTER — Inpatient Hospital Stay: Payer: MEDICARE | Attending: Oncology

## 2020-05-05 VITALS — BP 123/76 | HR 79 | Temp 97.8°F | Resp 18 | Ht 71.0 in | Wt 200.8 lb

## 2020-05-05 DIAGNOSIS — C9110 Chronic lymphocytic leukemia of B-cell type not having achieved remission: Secondary | ICD-10-CM

## 2020-05-05 DIAGNOSIS — Z7901 Long term (current) use of anticoagulants: Secondary | ICD-10-CM

## 2020-05-05 DIAGNOSIS — Z79899 Other long term (current) drug therapy: Secondary | ICD-10-CM | POA: Insufficient documentation

## 2020-05-05 DIAGNOSIS — Z85828 Personal history of other malignant neoplasm of skin: Secondary | ICD-10-CM | POA: Diagnosis not present

## 2020-05-05 DIAGNOSIS — N529 Male erectile dysfunction, unspecified: Secondary | ICD-10-CM | POA: Diagnosis not present

## 2020-05-05 DIAGNOSIS — D696 Thrombocytopenia, unspecified: Secondary | ICD-10-CM | POA: Diagnosis not present

## 2020-05-05 LAB — CBC WITH DIFFERENTIAL (CANCER CENTER ONLY)
Abs Immature Granulocytes: 0.28 10*3/uL — ABNORMAL HIGH (ref 0.00–0.07)
Basophils Absolute: 0.1 10*3/uL (ref 0.0–0.1)
Basophils Relative: 0 %
Eosinophils Absolute: 0.2 10*3/uL (ref 0.0–0.5)
Eosinophils Relative: 0 %
HCT: 36.3 % — ABNORMAL LOW (ref 39.0–52.0)
Hemoglobin: 10.9 g/dL — ABNORMAL LOW (ref 13.0–17.0)
Immature Granulocytes: 0 %
Lymphocytes Relative: 96 %
Lymphs Abs: 124.3 10*3/uL — ABNORMAL HIGH (ref 0.7–4.0)
MCH: 30.7 pg (ref 26.0–34.0)
MCHC: 30 g/dL (ref 30.0–36.0)
MCV: 102.3 fL — ABNORMAL HIGH (ref 80.0–100.0)
Monocytes Absolute: 1.9 10*3/uL — ABNORMAL HIGH (ref 0.1–1.0)
Monocytes Relative: 2 %
Neutro Abs: 3 10*3/uL (ref 1.7–7.7)
Neutrophils Relative %: 2 %
Platelet Count: 75 10*3/uL — ABNORMAL LOW (ref 150–400)
RBC: 3.55 MIL/uL — ABNORMAL LOW (ref 4.22–5.81)
RDW: 14.9 % (ref 11.5–15.5)
WBC Count: 129.8 10*3/uL (ref 4.0–10.5)
nRBC: 0 % (ref 0.0–0.2)

## 2020-05-05 LAB — PROTIME-INR
INR: 2.1 — ABNORMAL HIGH (ref 0.8–1.2)
Prothrombin Time: 22.7 seconds — ABNORMAL HIGH (ref 11.4–15.2)

## 2020-05-05 LAB — LACTATE DEHYDROGENASE: LDH: 237 U/L — ABNORMAL HIGH (ref 98–192)

## 2020-05-05 NOTE — Progress Notes (Signed)
[place] OFFICE PROGRESS NOTE   Diagnosis: CLL, anticoagulation therapy   INTERVAL HISTORY:   Chad Gross returns as scheduled.  He feels well.  Good appetite.  He reports intentional weight loss.  No fever.  He has occasional night sweats.  No change in the palpable lymph nodes or abdominal fullness.  No bleeding except with trauma.  No symptom of recurrent thrombosis.  Objective:  Vital signs in last 24 hours:  Blood pressure 123/76, pulse 79, temperature 97.8 F (36.6 C), temperature source Temporal, resp. rate 18, height 5\' 11"  (1.803 m), weight 200 lb 12.8 oz (91.1 kg), SpO2 96 %.   Lymphatics: Soft mobile bilateral cervical, submental, and right greater than left axillary nodes.  The nodes measure between 1 and 2 cm Resp: Lungs clear bilaterally Cardio: Regular rate and rhythm GI: No hepatosplenomegaly, firm masslike fullness in the mid upper abdomen Vascular: Chronic stasis change at the lower leg bilaterally   Lab Results:  Lab Results  Component Value Date   WBC 129.8 (HH) 05/05/2020   HGB 10.9 (L) 05/05/2020   HCT 36.3 (L) 05/05/2020   MCV 102.3 (H) 05/05/2020   PLT 75 (L) 05/05/2020   NEUTROABS 3.0 05/05/2020    CMP  Lab Results  Component Value Date   NA 142 04/02/2020   K 4.4 04/02/2020   CL 107 04/02/2020   CO2 25 04/02/2020   GLUCOSE 84 04/02/2020   BUN 19 04/02/2020   CREATININE 1.54 (H) 04/02/2020   CALCIUM 8.9 04/02/2020   PROT 6.4 (L) 04/02/2020   ALBUMIN 4.1 04/02/2020   AST 21 04/02/2020   ALT 11 04/02/2020   ALKPHOS 51 04/02/2020   BILITOT 0.5 04/02/2020   GFRNONAA 44 (L) 04/02/2020   GFRAA 51 (L) 04/02/2020      Lab Results  Component Value Date   INR 2.1 (H) 05/05/2020     Medications: I have reviewed the patient's current medications.   Assessment/Plan: 1. 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 He will return for a monthly PT check.  2. Indwelling  IVC catheter.  3. 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  4. 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 erectile dysfunction-followed by Dr. [name] , status post placement of a penile implant at [facility] in April 2014  7. CT of the abdomen/pelvis at [facility] urology February 2014 for evaluation of hematuria-12 mm external iliac nodes and leftperiaortic retroperitoneal lymph node-11 mm-potentially related to CLL 8.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, peripheral lymphocytosis and small palpable lymph nodes           CT abdomen/pelvis 09/18/2019-extensive abdominal pelvic lymphadenopathy including a left periaortic node measuring 5.7 cm     9.  Basal cell carcinoma removed from the left superior central forehead 01/21/2020     Disposition: Chad Gross appears unchanged.  He appears asymptomatic from the CLL.  He is stable from a hematologic standpoint.  We discussed indications for treating the CLL including the development of worsened cytopenias and "B "symptoms.  We discussed treatment with a BTK inhibitor and bendamustine/rituximab.  He will return for a lab visit in 1 month and an office visit in 2 months.  He will contact us for bleeding or enlargement of the palpable lymph nodes/abdominal mass.  I will recommend systemic therapy if the platelet count falls to less than 50,000.  The PT/INR is therapeutic.  He will continue Coumadin at the current dose.  Betsy Coder, MD  05/05/2020  3:20 PM

## 2020-05-13 ENCOUNTER — Ambulatory Visit: Payer: MEDICARE | Admitting: Oncology

## 2020-05-13 ENCOUNTER — Other Ambulatory Visit: Payer: MEDICARE

## 2020-05-28 ENCOUNTER — Encounter: Payer: Self-pay | Admitting: Internal Medicine

## 2020-05-29 MED ORDER — HYDROCORTISONE (PERIANAL) 2.5 % EX CREA: 1.0000 "application " | TOPICAL_CREAM | Freq: Two times a day (BID) | CUTANEOUS | 0 refills | Status: DC

## 2020-06-04 ENCOUNTER — Other Ambulatory Visit: Payer: Self-pay

## 2020-06-04 ENCOUNTER — Inpatient Hospital Stay: Payer: MEDICARE | Attending: Oncology

## 2020-06-04 ENCOUNTER — Telehealth: Payer: Self-pay | Admitting: *Deleted

## 2020-06-04 ENCOUNTER — Telehealth: Payer: Self-pay

## 2020-06-04 DIAGNOSIS — N529 Male erectile dysfunction, unspecified: Secondary | ICD-10-CM | POA: Diagnosis not present

## 2020-06-04 DIAGNOSIS — Z79899 Other long term (current) drug therapy: Secondary | ICD-10-CM | POA: Diagnosis not present

## 2020-06-04 DIAGNOSIS — C9110 Chronic lymphocytic leukemia of B-cell type not having achieved remission: Secondary | ICD-10-CM | POA: Diagnosis not present

## 2020-06-04 DIAGNOSIS — D696 Thrombocytopenia, unspecified: Secondary | ICD-10-CM | POA: Insufficient documentation

## 2020-06-04 DIAGNOSIS — Z85828 Personal history of other malignant neoplasm of skin: Secondary | ICD-10-CM | POA: Diagnosis not present

## 2020-06-04 DIAGNOSIS — Z7901 Long term (current) use of anticoagulants: Secondary | ICD-10-CM

## 2020-06-04 LAB — CBC WITH DIFFERENTIAL (CANCER CENTER ONLY)
Abs Immature Granulocytes: 0.3 10*3/uL — ABNORMAL HIGH (ref 0.00–0.07)
Basophils Absolute: 0 10*3/uL (ref 0.0–0.1)
Basophils Relative: 0 %
Eosinophils Absolute: 0.2 10*3/uL (ref 0.0–0.5)
Eosinophils Relative: 0 %
HCT: 36.4 % — ABNORMAL LOW (ref 39.0–52.0)
Hemoglobin: 10.9 g/dL — ABNORMAL LOW (ref 13.0–17.0)
Immature Granulocytes: 0 %
Lymphocytes Relative: 97 %
Lymphs Abs: 129.1 10*3/uL — ABNORMAL HIGH (ref 0.7–4.0)
MCH: 31.3 pg (ref 26.0–34.0)
MCHC: 29.9 g/dL — ABNORMAL LOW (ref 30.0–36.0)
MCV: 104.6 fL — ABNORMAL HIGH (ref 80.0–100.0)
Monocytes Absolute: 1.9 10*3/uL — ABNORMAL HIGH (ref 0.1–1.0)
Monocytes Relative: 1 %
Neutro Abs: 2.4 10*3/uL (ref 1.7–7.7)
Neutrophils Relative %: 2 %
Platelet Count: 76 10*3/uL — ABNORMAL LOW (ref 150–400)
RBC: 3.48 MIL/uL — ABNORMAL LOW (ref 4.22–5.81)
RDW: 15.1 % (ref 11.5–15.5)
WBC Count: 133.9 10*3/uL (ref 4.0–10.5)
nRBC: 0.1 % (ref 0.0–0.2)

## 2020-06-04 LAB — PROTIME-INR
INR: 2 — ABNORMAL HIGH (ref 0.8–1.2)
Prothrombin Time: 22.4 seconds — ABNORMAL HIGH (ref 11.4–15.2)

## 2020-06-04 NOTE — Telephone Encounter (Signed)
-----   Message from Ladell Pier, MD sent at 06/04/2020  2:30 PM EDT ----- Please call patient, CBC is stable, PT/INR is therapeutic, continue Coumadin at the same dose, follow-up as scheduled, call for bleeding

## 2020-06-04 NOTE — Telephone Encounter (Signed)
CRITICAL VALUE STICKER  CRITICAL VALUE: WBC 133.9  RECEIVER: Wendall Mola, RN  DATE & TIME NOTIFIED: 06/04/2020 @ 1118  MESSENGER: Rolland Porter  MD NOTIFIED: Dr. Benay Spice  TIME OF NOTIFICATION: 3536  RESPONSE: No further instruction received

## 2020-06-04 NOTE — Telephone Encounter (Signed)
Left VM with stable CBC results and therapeutic INR-continue same warfarin and f/u in August as scheduled.

## 2020-06-09 ENCOUNTER — Encounter: Payer: Self-pay | Admitting: Internal Medicine

## 2020-06-19 ENCOUNTER — Encounter: Payer: Self-pay | Admitting: Oncology

## 2020-06-24 ENCOUNTER — Telehealth: Payer: Self-pay | Admitting: Oncology

## 2020-06-24 NOTE — Telephone Encounter (Signed)
rescheduled appts per print out with hand written instructions from [facility]. Pt confirmed new appt date and time.

## 2020-07-03 ENCOUNTER — Ambulatory Visit: Payer: MEDICARE | Admitting: Oncology

## 2020-07-03 ENCOUNTER — Other Ambulatory Visit: Payer: MEDICARE

## 2020-07-14 ENCOUNTER — Telehealth: Payer: Self-pay | Admitting: *Deleted

## 2020-07-14 ENCOUNTER — Other Ambulatory Visit: Payer: Self-pay

## 2020-07-14 ENCOUNTER — Inpatient Hospital Stay (HOSPITAL_BASED_OUTPATIENT_CLINIC_OR_DEPARTMENT_OTHER): Payer: MEDICARE | Admitting: Oncology

## 2020-07-14 ENCOUNTER — Inpatient Hospital Stay: Payer: MEDICARE | Attending: Oncology

## 2020-07-14 VITALS — BP 116/67 | HR 81 | Temp 98.4°F | Resp 18 | Ht 71.0 in | Wt 197.1 lb

## 2020-07-14 DIAGNOSIS — C9110 Chronic lymphocytic leukemia of B-cell type not having achieved remission: Secondary | ICD-10-CM | POA: Diagnosis present

## 2020-07-14 DIAGNOSIS — Z7901 Long term (current) use of anticoagulants: Secondary | ICD-10-CM

## 2020-07-14 DIAGNOSIS — Z85828 Personal history of other malignant neoplasm of skin: Secondary | ICD-10-CM | POA: Insufficient documentation

## 2020-07-14 DIAGNOSIS — N529 Male erectile dysfunction, unspecified: Secondary | ICD-10-CM | POA: Diagnosis not present

## 2020-07-14 DIAGNOSIS — Z79899 Other long term (current) drug therapy: Secondary | ICD-10-CM | POA: Insufficient documentation

## 2020-07-14 DIAGNOSIS — D72820 Lymphocytosis (symptomatic): Secondary | ICD-10-CM | POA: Insufficient documentation

## 2020-07-14 DIAGNOSIS — D696 Thrombocytopenia, unspecified: Secondary | ICD-10-CM | POA: Insufficient documentation

## 2020-07-14 LAB — CMP (CANCER CENTER ONLY)
ALT: 9 U/L (ref 0–44)
AST: 20 U/L (ref 15–41)
Albumin: 4 g/dL (ref 3.5–5.0)
Alkaline Phosphatase: 50 U/L (ref 38–126)
Anion gap: 11 (ref 5–15)
BUN: 28 mg/dL — ABNORMAL HIGH (ref 8–23)
CO2: 22 mmol/L (ref 22–32)
Calcium: 9.5 mg/dL (ref 8.9–10.3)
Chloride: 108 mmol/L (ref 98–111)
Creatinine: 1.58 mg/dL — ABNORMAL HIGH (ref 0.61–1.24)
GFR, Est AFR Am: 49 mL/min — ABNORMAL LOW (ref >60)
GFR, Estimated: 42 mL/min — ABNORMAL LOW (ref >60)
Glucose, Bld: 124 mg/dL — ABNORMAL HIGH (ref 70–99)
Potassium: 4 mmol/L (ref 3.5–5.1)
Sodium: 141 mmol/L (ref 135–145)
Total Bilirubin: 0.6 mg/dL (ref 0.3–1.2)
Total Protein: 6.4 g/dL — ABNORMAL LOW (ref 6.5–8.1)

## 2020-07-14 LAB — CBC WITH DIFFERENTIAL (CANCER CENTER ONLY)
Abs Immature Granulocytes: 0.32 10*3/uL — ABNORMAL HIGH (ref 0.00–0.07)
Basophils Absolute: 0.1 10*3/uL (ref 0.0–0.1)
Basophils Relative: 0 %
Eosinophils Absolute: 0.3 10*3/uL (ref 0.0–0.5)
Eosinophils Relative: 0 %
HCT: 33.9 % — ABNORMAL LOW (ref 39.0–52.0)
Hemoglobin: 9.8 g/dL — ABNORMAL LOW (ref 13.0–17.0)
Immature Granulocytes: 0 %
Lymphocytes Relative: 96 %
Lymphs Abs: 129.9 10*3/uL — ABNORMAL HIGH (ref 0.7–4.0)
MCH: 30.2 pg (ref 26.0–34.0)
MCHC: 28.9 g/dL — ABNORMAL LOW (ref 30.0–36.0)
MCV: 104.3 fL — ABNORMAL HIGH (ref 80.0–100.0)
Monocytes Absolute: 2.1 10*3/uL — ABNORMAL HIGH (ref 0.1–1.0)
Monocytes Relative: 2 %
Neutro Abs: 2.7 10*3/uL (ref 1.7–7.7)
Neutrophils Relative %: 2 %
Platelet Count: 67 10*3/uL — ABNORMAL LOW (ref 150–400)
RBC: 3.25 MIL/uL — ABNORMAL LOW (ref 4.22–5.81)
RDW: 16.1 % — ABNORMAL HIGH (ref 11.5–15.5)
WBC Count: 135.3 10*3/uL (ref 4.0–10.5)
nRBC: 0.1 % (ref 0.0–0.2)

## 2020-07-14 LAB — LACTATE DEHYDROGENASE: LDH: 238 U/L — ABNORMAL HIGH (ref 98–192)

## 2020-07-14 LAB — PROTIME-INR
INR: 1.8 — ABNORMAL HIGH (ref 0.8–1.2)
Prothrombin Time: 20.3 seconds — ABNORMAL HIGH (ref 11.4–15.2)

## 2020-07-14 NOTE — Progress Notes (Signed)
CRITICAL VALUE STICKER  CRITICAL VALUE: 135.3  RECEIVER (on-site recipient of call): Meriel Flavors LPN  DATE & TIME NOTIFIED:   MESSENGER (representative from lab): Bertram Gala LPN  MD NOTIFIED:  Dr Benay Spice  TIME OF NOTIFICATION: 940am RESPONSE:  Seeing patient in office

## 2020-07-14 NOTE — Progress Notes (Signed)
  Chad Gross   Diagnosis: CLL, anticoagulation therapy  INTERVAL HISTORY:   Chad Gross returns as scheduled.  No bleeding.  He has an occasional night sweat.  He has noted a mild decrease in his appetite.  No other complaint.  Objective:  Vital signs in last 24 hours:  Blood pressure 116/67, pulse 81, temperature 98.4 F (36.9 C), temperature source Tympanic, resp. rate 18, height 5\' 11"  (1.803 m), weight 197 lb 1.6 oz (89.4 kg), SpO2 98 %.    Lymphatics: Soft mobile 1-2 cm bilateral cervical, 2-3 cm right greater than left axillary nodes Resp: Clear bilaterally Cardio: Regular rate and rhythm GI: Masslike fullness in the mid upper abdomen, no hepatosplenomegaly Vascular: Trace edema at the right greater than left lower leg   Lab Results:  Lab Results  Component Value Date   WBC 135.3 (HH) 07/14/2020   HGB 9.8 (L) 07/14/2020   HCT 33.9 (L) 07/14/2020   MCV 104.3 (H) 07/14/2020   PLT 67 (L) 07/14/2020   NEUTROABS 2.7 07/14/2020    CMP  Lab Results  Component Value Date   NA 142 04/02/2020   K 4.4 04/02/2020   CL 107 04/02/2020   CO2 25 04/02/2020   GLUCOSE 84 04/02/2020   BUN 19 04/02/2020   CREATININE 1.54 (H) 04/02/2020   CALCIUM 8.9 04/02/2020   PROT 6.4 (L) 04/02/2020   ALBUMIN 4.1 04/02/2020   AST 21 04/02/2020   ALT 11 04/02/2020   ALKPHOS 51 04/02/2020   BILITOT 0.5 04/02/2020   GFRNONAA 44 (L) 04/02/2020   GFRAA 51 (L) 04/02/2020     Medications: I have reviewed the patient's current medications.   Assessment/Plan: 1. 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 He will return for a monthly PT check.  2. Indwelling IVC catheter.  3. 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  4. 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 erectile dysfunction-followed by Dr. [name] , status post placement of  a penile implant at [facility] in April 2014  7. CT of the abdomen/pelvis at [facility] urology February 2014 for evaluation of hematuria-12 mm external iliac nodes and leftperiaortic retroperitoneal lymph node-11 mm-potentially related to CLL 8.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, peripheral lymphocytosis and small palpable lymph nodes           CT abdomen/pelvis 09/18/2019-extensive abdominal pelvic lymphadenopathy including a left periaortic node measuring 5.7 cm     9.  Basal cell carcinoma removed from the left superior central forehead 01/21/2020    Disposition: Chad Gross has chronic lymphocytic leukemia.  He has marked lymphocytosis, slowly progressive lymphadenopathy, and anemia/thrombocytopenia.  He understands he is now at a point where treatment is recommended.  He would like to wait on beginning systemic therapy until he returns from 2 scheduled vacations over the next month.  He will call for bleeding or new symptoms.  He will continue Coumadin at the current dose.  Chad Gross will return for an office visit on 08/13/2020.  I recommended he obtain the COVID-19 booster vaccine.  Betsy Coder, MD  07/14/2020  9:52 AM

## 2020-07-14 NOTE — Telephone Encounter (Signed)
Critical Lab WBC 135.3. Dr.Sherrill desk nurse Glenard Haring, LPN and Cristy Friedlander RN notified.

## 2020-07-15 ENCOUNTER — Encounter: Payer: Self-pay | Admitting: Oncology

## 2020-07-18 ENCOUNTER — Telehealth: Payer: Self-pay | Admitting: Oncology

## 2020-07-18 NOTE — Telephone Encounter (Signed)
Called pt per 8/19 sch msg - unable to reach pt .scheduled appt for next f/u date and left message for pt to call back if they want to get the booster sooner

## 2020-07-22 ENCOUNTER — Inpatient Hospital Stay: Payer: Medicare Other | Attending: Oncology

## 2020-07-22 ENCOUNTER — Other Ambulatory Visit: Payer: Self-pay

## 2020-07-22 DIAGNOSIS — Z23 Encounter for immunization: Secondary | ICD-10-CM | POA: Insufficient documentation

## 2020-07-22 DIAGNOSIS — C9110 Chronic lymphocytic leukemia of B-cell type not having achieved remission: Secondary | ICD-10-CM | POA: Insufficient documentation

## 2020-07-22 NOTE — Progress Notes (Signed)
° °  Covid-19 Vaccination Clinic  Name:  Chad Gross    MRN: [id number] DOB: Feb 09, 1946  07/22/2020  [name] was observed post Covid-19 immunization for 15 minutes without incident. He was provided with Vaccine Information Sheet and instruction to access the V-Safe system.   [name] was instructed to call 911 with any severe reactions post vaccine:  Difficulty breathing   Swelling of face and throat   A fast heartbeat   A bad rash all over body   Dizziness and weakness   Immunizations Administered    Name Date Dose VIS Date Route   Pfizer COVID-19 Vaccine 07/22/2020 10:34 AM 0.3 mL 01/23/2019 Intramuscular   Manufacturer: [place]   Lot: [id number]   [place]aker: [id number]

## 2020-08-06 IMAGING — CT CT ABD-PELV W/O CM
2 of 4 series · 16 of 46 positions shown, 18 images · non-contrast
Comparison: 12/27/2012

CLINICAL DATA: CLL, upper abdominal fullness

EXAM:
CT ABDOMEN AND PELVIS WITHOUT CONTRAST
TECHNIQUE: Multidetector CT imaging of the abdomen and pelvis was performed
following the standard protocol without IV contrast.

[Series 2: axial st · axial · 0.81mm/px · z∈[-623,-208]mm · 13 of 95 slices shown, 15 images]
[im 6/95  soft-tissue]
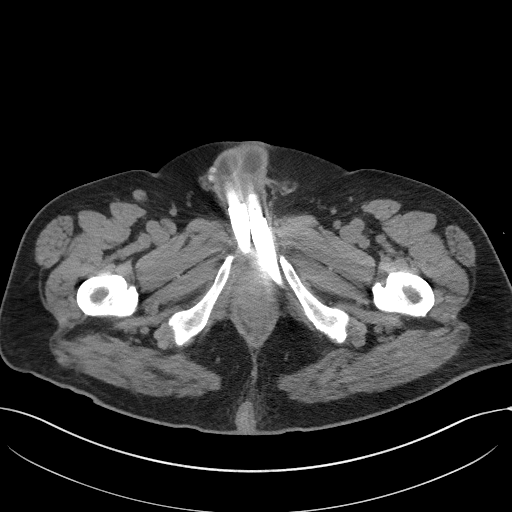
[im 6/95  bone]
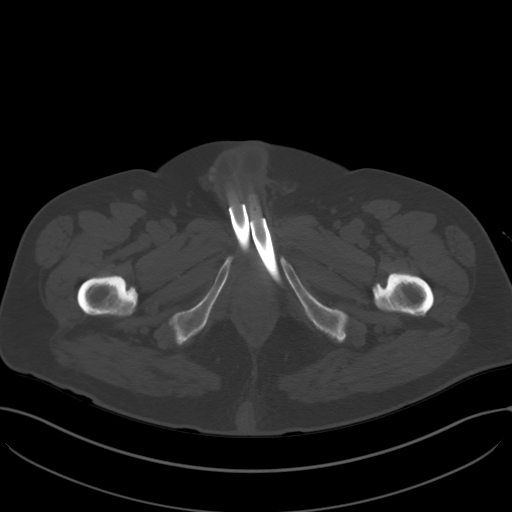
[im 11/95  soft-tissue]
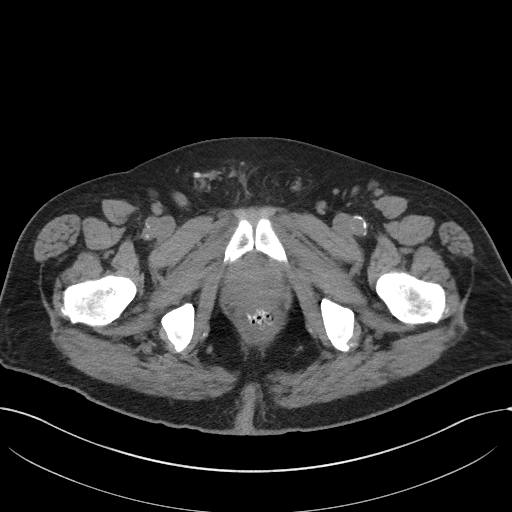
[im 21/95  soft-tissue]
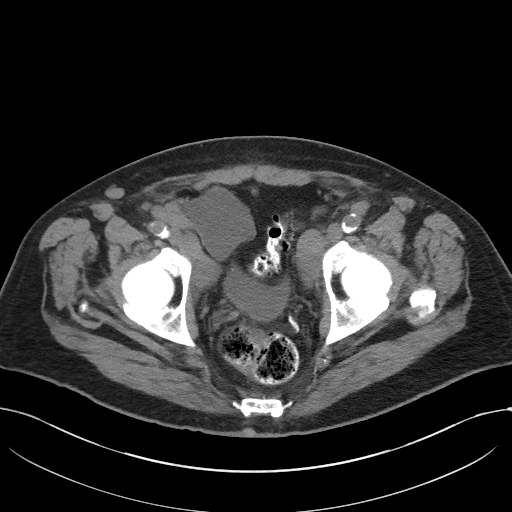
[im 27/95  soft-tissue]
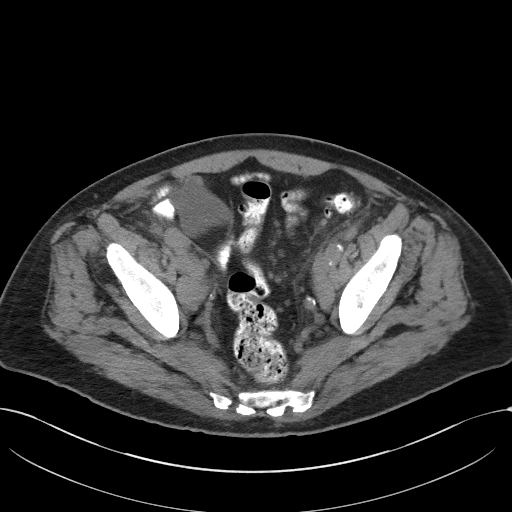
[im 32/95  soft-tissue]
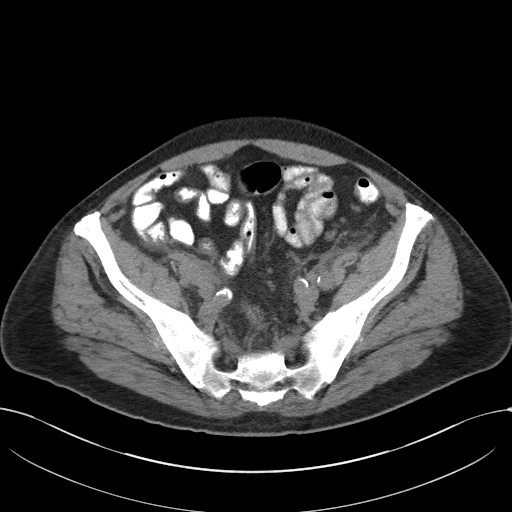
[im 42/95  soft-tissue]
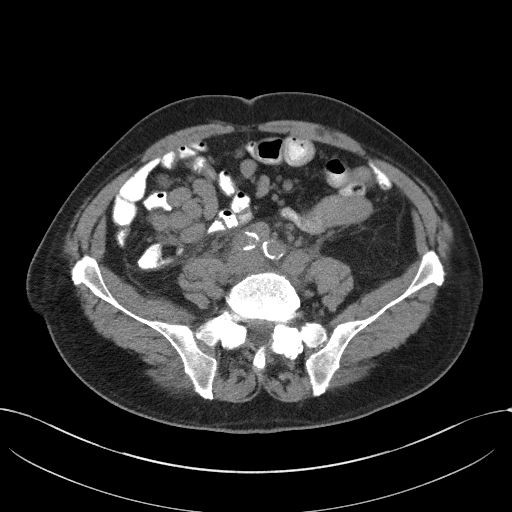
[im 48/95  soft-tissue]
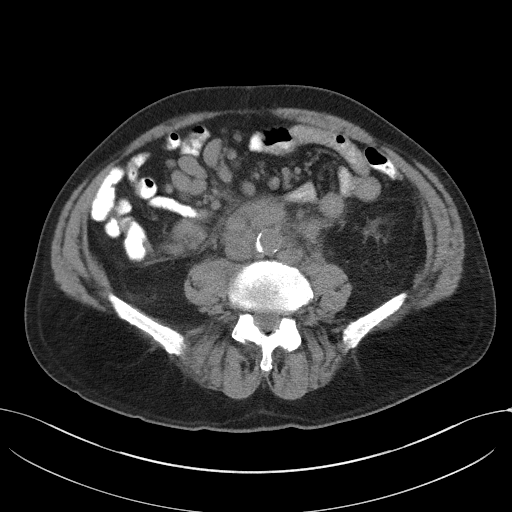
[im 53/95  soft-tissue]
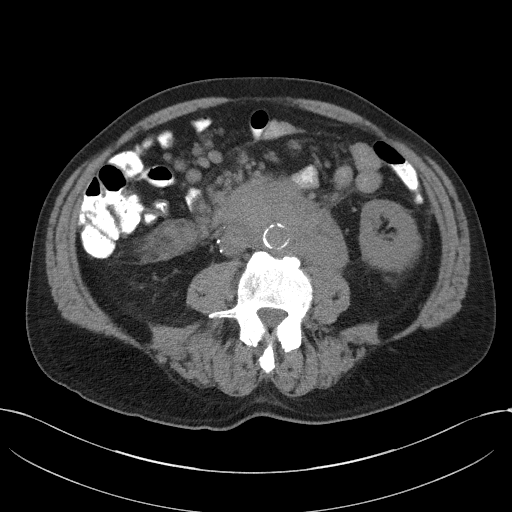
[im 63/95  soft-tissue]
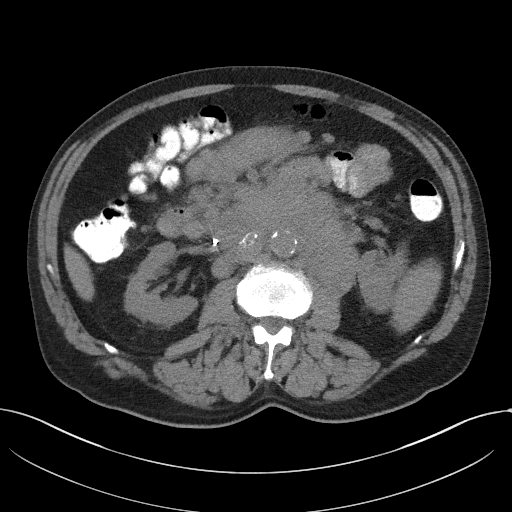
[im 63/95  bone]
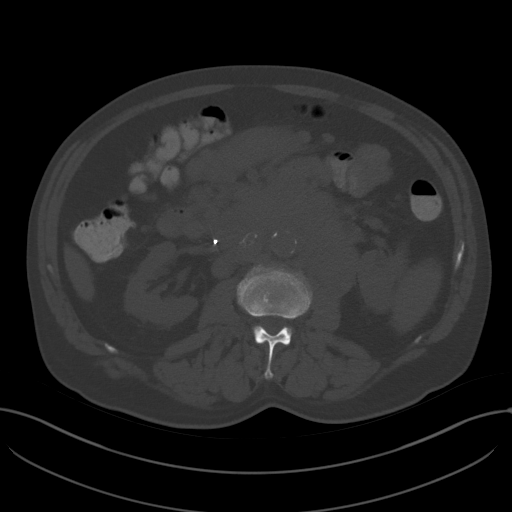
[im 68/95  soft-tissue]
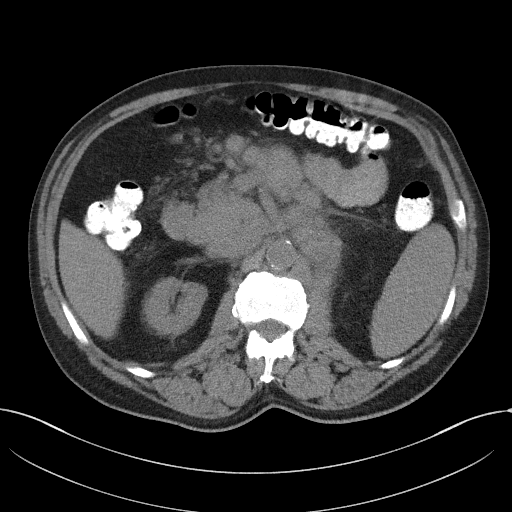
[im 74/95  soft-tissue]
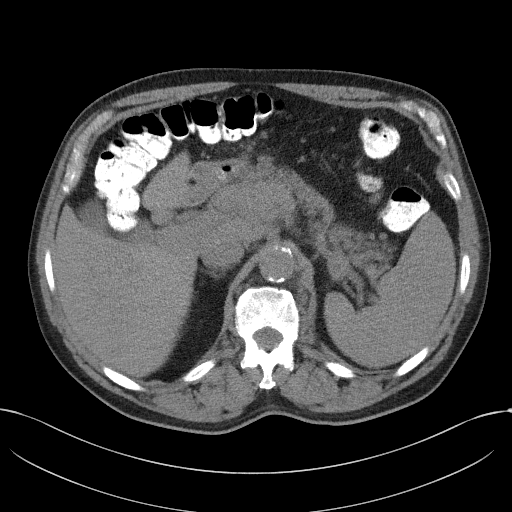
[im 84/95  soft-tissue]
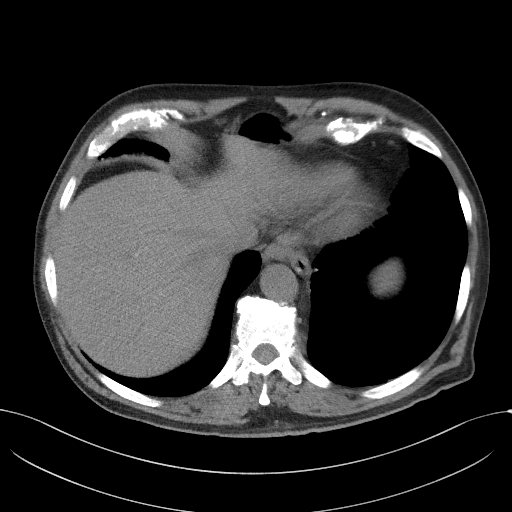
[im 89/95  soft-tissue]
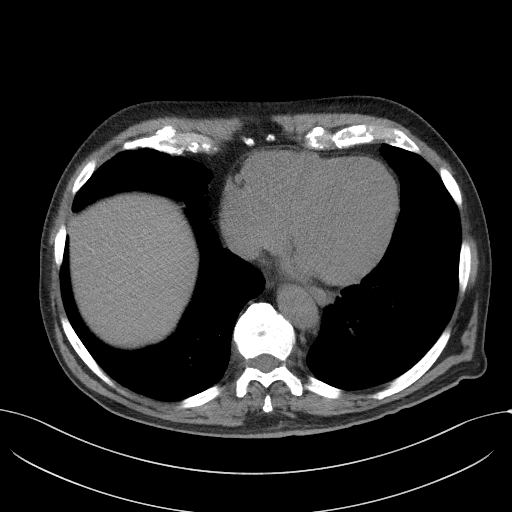

[Series 5: coronal st · coronal · 0.82mm/px · 3 of 103 slices shown]
[im 35/103  soft-tissue]
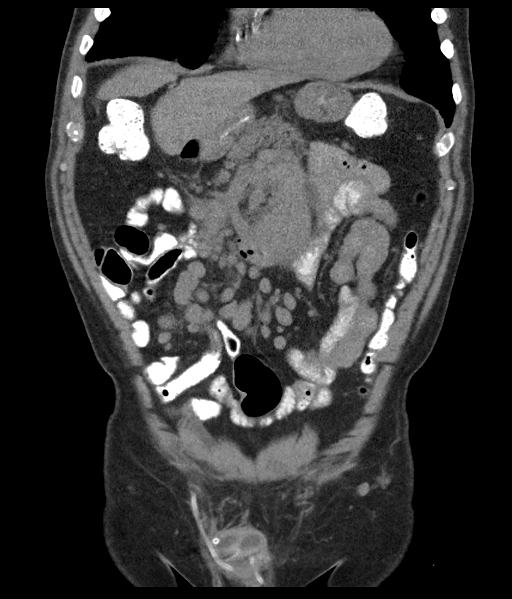
[im 46/103  soft-tissue]
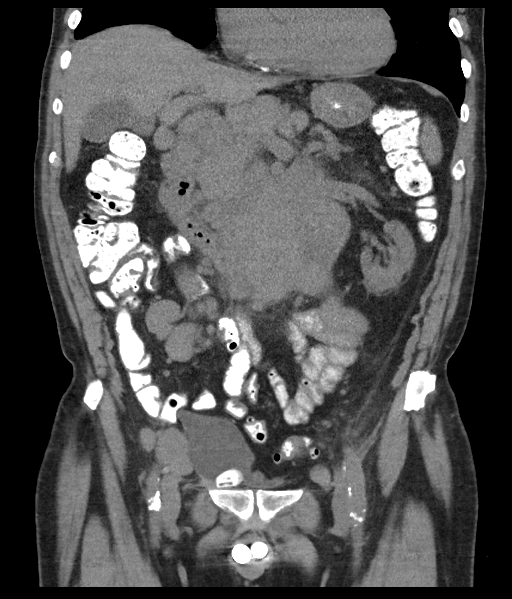
[im 57/103  soft-tissue]
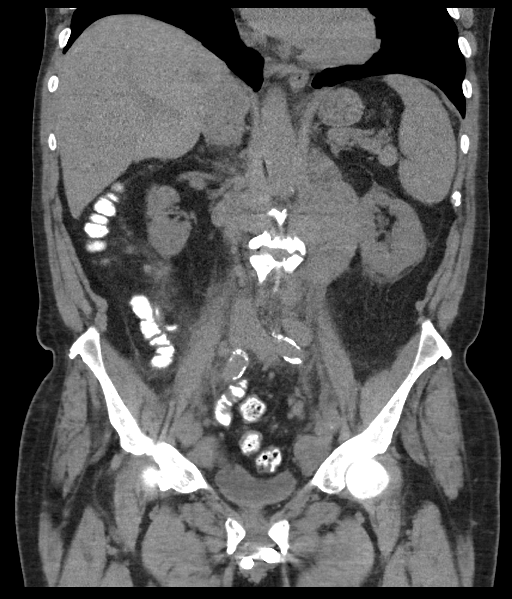

[16 of 46 positions shown; findings below may reference images not displayed]

FINDINGS: Lower chest: Subpleural reticulation/fibrosis in the bilateral lower
lobes.

Hepatobiliary: Unenhanced liver is unremarkable.

Gallbladder is unremarkable. No intrahepatic or extrahepatic duct
dilatation.

Pancreas: Within normal limits.

Spleen: Spleen is normal in size.

Adrenals/Urinary Tract: Adrenal glands are within normal limits.

14 mm left lower pole renal cyst (series 2/image 46). Right kidney
is within normal limits. No renal calculi or hydronephrosis.

Bladder is within normal limits.

Stomach/Bowel: Stomach is within normal limits.

No evidence of bowel obstruction.

Normal appendix (series 2/image 52).

Mild sigmoid diverticulosis, without evidence of diverticulitis.

Vascular/Lymphatic: No evidence of abdominal aortic aneurysm.

Atherosclerotic calcifications of the abdominal aorta and branch
vessels.

IVC filter.

Extensive abdominopelvic lymphadenopathy, including:

--10 mm short axis distal [place] off adrenal node (series 2/image 11)

--3.5 cm short axis aggregate gastrohepatic node (series 2/image 20)

--3.8 cm short axis right aortocaval node (series 2/image 36)

--5.7 cm short axis aggregate left para-aortic node (series 2/image
36)

--1.7 cm short axis ileocolic node (series 2/image 45)

--1.8 cm short axis right mid abdominal mesenteric node (series
2/image 51)

--1.7 cm short axis left common iliac node (series 2/image 54)

--3.3 cm short axis right external iliac node (series 2/image 72)

--2.6 cm short axis left external iliac node (series 2/image 74)

Previously, small left para-aortic nodes measured up to 11 mm short
axis and small external iliac nodes measured up to 12 mm short axis.

Reproductive: Prostate is unremarkable.

Penile prosthesis with reservoir in the right lower quadrant.

Other: No abdominopelvic ascites.

Musculoskeletal: Degenerative changes of the visualized
thoracolumbar spine.
IMPRESSION: Extensive abdominopelvic lymphadenopathy, as above.

Spleen is normal in size.

## 2020-08-13 ENCOUNTER — Other Ambulatory Visit: Payer: Self-pay

## 2020-08-13 ENCOUNTER — Inpatient Hospital Stay: Payer: MEDICARE | Attending: Oncology

## 2020-08-13 ENCOUNTER — Encounter: Payer: Self-pay | Admitting: *Deleted

## 2020-08-13 ENCOUNTER — Inpatient Hospital Stay (HOSPITAL_BASED_OUTPATIENT_CLINIC_OR_DEPARTMENT_OTHER): Payer: MEDICARE | Admitting: Nurse Practitioner

## 2020-08-13 ENCOUNTER — Other Ambulatory Visit: Payer: MEDICARE

## 2020-08-13 ENCOUNTER — Encounter: Payer: Self-pay | Admitting: Nurse Practitioner

## 2020-08-13 ENCOUNTER — Encounter: Payer: Self-pay | Admitting: Oncology

## 2020-08-13 ENCOUNTER — Ambulatory Visit: Payer: MEDICARE

## 2020-08-13 ENCOUNTER — Ambulatory Visit: Payer: MEDICARE | Admitting: Nurse Practitioner

## 2020-08-13 VITALS — BP 136/68 | HR 74 | Temp 98.1°F | Resp 17 | Ht 71.0 in | Wt 199.4 lb

## 2020-08-13 DIAGNOSIS — R319 Hematuria, unspecified: Secondary | ICD-10-CM | POA: Diagnosis not present

## 2020-08-13 DIAGNOSIS — C9110 Chronic lymphocytic leukemia of B-cell type not having achieved remission: Secondary | ICD-10-CM | POA: Diagnosis present

## 2020-08-13 DIAGNOSIS — Z85828 Personal history of other malignant neoplasm of skin: Secondary | ICD-10-CM | POA: Diagnosis not present

## 2020-08-13 DIAGNOSIS — Z23 Encounter for immunization: Secondary | ICD-10-CM | POA: Insufficient documentation

## 2020-08-13 DIAGNOSIS — I82409 Acute embolism and thrombosis of unspecified deep veins of unspecified lower extremity: Secondary | ICD-10-CM

## 2020-08-13 DIAGNOSIS — N529 Male erectile dysfunction, unspecified: Secondary | ICD-10-CM | POA: Insufficient documentation

## 2020-08-13 DIAGNOSIS — D696 Thrombocytopenia, unspecified: Secondary | ICD-10-CM | POA: Diagnosis not present

## 2020-08-13 DIAGNOSIS — Z7901 Long term (current) use of anticoagulants: Secondary | ICD-10-CM | POA: Diagnosis not present

## 2020-08-13 DIAGNOSIS — Z79899 Other long term (current) drug therapy: Secondary | ICD-10-CM | POA: Insufficient documentation

## 2020-08-13 LAB — CMP (CANCER CENTER ONLY)
ALT: 9 U/L (ref 0–44)
AST: 21 U/L (ref 15–41)
Albumin: 3.8 g/dL (ref 3.5–5.0)
Alkaline Phosphatase: 54 U/L (ref 38–126)
Anion gap: 7 (ref 5–15)
BUN: 27 mg/dL — ABNORMAL HIGH (ref 8–23)
CO2: 26 mmol/L (ref 22–32)
Calcium: 9.2 mg/dL (ref 8.9–10.3)
Chloride: 107 mmol/L (ref 98–111)
Creatinine: 1.51 mg/dL — ABNORMAL HIGH (ref 0.61–1.24)
GFR, Est AFR Am: 52 mL/min — ABNORMAL LOW (ref >60)
GFR, Estimated: 45 mL/min — ABNORMAL LOW (ref >60)
Glucose, Bld: 74 mg/dL (ref 70–99)
Potassium: 4.3 mmol/L (ref 3.5–5.1)
Sodium: 140 mmol/L (ref 135–145)
Total Bilirubin: 0.6 mg/dL (ref 0.3–1.2)
Total Protein: 6.3 g/dL — ABNORMAL LOW (ref 6.5–8.1)

## 2020-08-13 LAB — CBC WITH DIFFERENTIAL (CANCER CENTER ONLY)
Abs Immature Granulocytes: 0.27 10*3/uL — ABNORMAL HIGH (ref 0.00–0.07)
Basophils Absolute: 0.1 10*3/uL (ref 0.0–0.1)
Basophils Relative: 0 %
Eosinophils Absolute: 0.3 10*3/uL (ref 0.0–0.5)
Eosinophils Relative: 0 %
HCT: 31.9 % — ABNORMAL LOW (ref 39.0–52.0)
Hemoglobin: 9.4 g/dL — ABNORMAL LOW (ref 13.0–17.0)
Immature Granulocytes: 0 %
Lymphocytes Relative: 96 %
Lymphs Abs: 134.5 10*3/uL — ABNORMAL HIGH (ref 0.7–4.0)
MCH: 30.9 pg (ref 26.0–34.0)
MCHC: 29.5 g/dL — ABNORMAL LOW (ref 30.0–36.0)
MCV: 104.9 fL — ABNORMAL HIGH (ref 80.0–100.0)
Monocytes Absolute: 2.6 10*3/uL — ABNORMAL HIGH (ref 0.1–1.0)
Monocytes Relative: 2 %
Neutro Abs: 2.2 10*3/uL (ref 1.7–7.7)
Neutrophils Relative %: 2 %
Platelet Count: 70 10*3/uL — ABNORMAL LOW (ref 150–400)
RBC: 3.04 MIL/uL — ABNORMAL LOW (ref 4.22–5.81)
RDW: 16.8 % — ABNORMAL HIGH (ref 11.5–15.5)
WBC Count: 140 10*3/uL (ref 4.0–10.5)
nRBC: 0.1 % (ref 0.0–0.2)

## 2020-08-13 LAB — PROTIME-INR
INR: 1.9 — ABNORMAL HIGH (ref 0.8–1.2)
Prothrombin Time: 21 seconds — ABNORMAL HIGH (ref 11.4–15.2)

## 2020-08-13 MED ORDER — INFLUENZA VAC A&B SA ADJ QUAD 0.5 ML IM PRSY
0.5000 mL | PREFILLED_SYRINGE | Freq: Once | INTRAMUSCULAR | Status: AC
  Administered 2020-08-13: 0.5 mL via INTRAMUSCULAR

## 2020-08-13 MED ORDER — INFLUENZA VAC A&B SA ADJ QUAD 0.5 ML IM PRSY
PREFILLED_SYRINGE | INTRAMUSCULAR | Status: AC
  Filled 2020-08-13: qty 0.5

## 2020-08-13 NOTE — Progress Notes (Signed)
CRITICAL VALUE STICKER  CRITICAL VALUE: WBC 140  RECEIVER (on-site recipient of call): Rashidah [place],RN  DATE & TIME NOTIFIED: 08/13/20 @ 12:13  MESSENGER (representative from lab): Pam  MD NOTIFIED: [name], NP  TIME OF NOTIFICATION: 12:20  RESPONSE: Being seen by provider in clinic. WBC is chronically elevated.

## 2020-08-13 NOTE — Progress Notes (Addendum)
°  [place] OFFICE PROGRESS NOTE   Diagnosis: CLL, anticoagulation therapy  INTERVAL HISTORY:   Chad Gross returns as scheduled.  He feels well.  No fevers or sweats.  No change in lymph nodes.  He has a good appetite.  No nausea or abdominal pain.  Objective:  Vital signs in last 24 hours:  Blood pressure 136/68, pulse 74, temperature 98.1 F (36.7 C), temperature source Tympanic, resp. rate 17, height 5\' 11"  (1.803 m), weight 199 lb 6.4 oz (90.4 kg), SpO2 98 %.    Lymphatics: Bilateral 2 to 3 cm axillary lymph nodes right greater than left GI: Masslike fullness mid upper abdomen.  No splenomegaly. Vascular: Trace edema at the lower legs bilaterally right greater than left.    Lab Results:  Lab Results  Component Value Date   WBC 140.0 (HH) 08/13/2020   HGB 9.4 (L) 08/13/2020   HCT 31.9 (L) 08/13/2020   MCV 104.9 (H) 08/13/2020   PLT 70 (L) 08/13/2020   NEUTROABS PENDING 08/13/2020    Imaging:  No results found.  Medications: I have reviewed the patient's current medications.  Assessment/Plan: 1. 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 He will return for a monthly PT check.  2. Indwelling IVC catheter.  3. 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  4. 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 erectile dysfunction-followed by Chad Gross , status post placement of a penile implant at [facility] in April 2014  7. CT of the abdomen/pelvis at [facility] urology February 2014 for evaluation of hematuria-12 mm external iliac nodes and leftperiaortic retroperitoneal lymph node-11 mm-potentially related to CLL 8.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, peripheral lymphocytosis and small palpable lymph nodes           CT abdomen/pelvis 09/18/2019-extensive  abdominal pelvic lymphadenopathy including a left periaortic node measuring 5.7 cm     9.  Basal cell carcinoma removed from the left superior central forehead 01/21/2020   Disposition: Chad Gross appears stable.  We reviewed the CBC from today.  The white count continues to slowly increase, mild progression of anemia, platelet count is stable.  Lymphadenopathy is stable overall.  Chad Gross recommends he consider beginning treatment.  We had preliminary discussion today regarding bendamustine/Rituxan.  He was provided printed information on both.  He will attend an education class.  He has a vacation planned in October.  He agrees to a follow-up visit 09/16/2020 for further discussion.  The PT/INR is in therapeutic range.  He will continue Coumadin at the current dose.  Influenza vaccine today.  He will return for follow-up on 09/16/2020.  He will contact the office in the interim with any problems.  Patient seen with Chad Gross.  Chad Gross ANP/GNP-BC   08/13/2020  12:34 PM  This was a shared visit with Chad Gross.  Chad Gross appears stable.  He has progressive anemia and slow progression of lymphadenopathy.  I recommend beginning systemic therapy.  He would like to wait until he returns from another vacation next month.  We discussed bendamustine/rituximab and BTK inhibitor therapy.  We will check a CLL FISH panel to see if he is eligible for treatment with bendamustine/rituximab.  He will call for bleeding or new symptoms.   Chad Manson, MD

## 2020-08-14 ENCOUNTER — Telehealth: Payer: Self-pay | Admitting: Nurse Practitioner

## 2020-08-14 NOTE — Telephone Encounter (Signed)
Scheduled appointments per 9/15 los. Patient is aware of appointments.

## 2020-08-21 LAB — FISH,CLL PROGNOSTIC PANEL

## 2020-08-22 ENCOUNTER — Encounter: Payer: Self-pay | Admitting: Oncology

## 2020-09-16 ENCOUNTER — Encounter: Payer: Self-pay | Admitting: Nurse Practitioner

## 2020-09-16 ENCOUNTER — Other Ambulatory Visit: Payer: PRIVATE HEALTH INSURANCE

## 2020-09-16 ENCOUNTER — Inpatient Hospital Stay: Payer: MEDICARE | Attending: Oncology | Admitting: Nurse Practitioner

## 2020-09-16 ENCOUNTER — Ambulatory Visit: Payer: PRIVATE HEALTH INSURANCE | Admitting: Oncology

## 2020-09-16 ENCOUNTER — Other Ambulatory Visit: Payer: Self-pay

## 2020-09-16 ENCOUNTER — Inpatient Hospital Stay: Payer: MEDICARE

## 2020-09-16 VITALS — BP 144/65 | HR 77 | Temp 97.9°F | Resp 18 | Ht 71.0 in | Wt 197.1 lb

## 2020-09-16 DIAGNOSIS — Z86718 Personal history of other venous thrombosis and embolism: Secondary | ICD-10-CM | POA: Insufficient documentation

## 2020-09-16 DIAGNOSIS — C9110 Chronic lymphocytic leukemia of B-cell type not having achieved remission: Secondary | ICD-10-CM

## 2020-09-16 DIAGNOSIS — Z7901 Long term (current) use of anticoagulants: Secondary | ICD-10-CM

## 2020-09-16 DIAGNOSIS — I82409 Acute embolism and thrombosis of unspecified deep veins of unspecified lower extremity: Secondary | ICD-10-CM

## 2020-09-16 LAB — CBC WITH DIFFERENTIAL (CANCER CENTER ONLY)
Abs Immature Granulocytes: 0.42 10*3/uL — ABNORMAL HIGH (ref 0.00–0.07)
Basophils Absolute: 0.1 10*3/uL (ref 0.0–0.1)
Basophils Relative: 0 %
Eosinophils Absolute: 0.3 10*3/uL (ref 0.0–0.5)
Eosinophils Relative: 0 %
HCT: 33 % — ABNORMAL LOW (ref 39.0–52.0)
Hemoglobin: 9.6 g/dL — ABNORMAL LOW (ref 13.0–17.0)
Immature Granulocytes: 0 %
Lymphocytes Relative: 96 %
Lymphs Abs: 150.1 10*3/uL — ABNORMAL HIGH (ref 0.7–4.0)
MCH: 31 pg (ref 26.0–34.0)
MCHC: 29.1 g/dL — ABNORMAL LOW (ref 30.0–36.0)
MCV: 106.5 fL — ABNORMAL HIGH (ref 80.0–100.0)
Monocytes Absolute: 2.6 10*3/uL — ABNORMAL HIGH (ref 0.1–1.0)
Monocytes Relative: 2 %
Neutro Abs: 2.4 10*3/uL (ref 1.7–7.7)
Neutrophils Relative %: 2 %
Platelet Count: 75 10*3/uL — ABNORMAL LOW (ref 150–400)
RBC: 3.1 MIL/uL — ABNORMAL LOW (ref 4.22–5.81)
RDW: 18.4 % — ABNORMAL HIGH (ref 11.5–15.5)
WBC Count: 155.8 10*3/uL (ref 4.0–10.5)
nRBC: 0.1 % (ref 0.0–0.2)

## 2020-09-16 LAB — LACTATE DEHYDROGENASE: LDH: 221 U/L — ABNORMAL HIGH (ref 98–192)

## 2020-09-16 LAB — CMP (CANCER CENTER ONLY)
ALT: 7 U/L (ref 0–44)
AST: 17 U/L (ref 15–41)
Albumin: 4 g/dL (ref 3.5–5.0)
Alkaline Phosphatase: 58 U/L (ref 38–126)
Anion gap: 4 — ABNORMAL LOW (ref 5–15)
BUN: 26 mg/dL — ABNORMAL HIGH (ref 8–23)
CO2: 30 mmol/L (ref 22–32)
Calcium: 9.5 mg/dL (ref 8.9–10.3)
Chloride: 106 mmol/L (ref 98–111)
Creatinine: 1.49 mg/dL — ABNORMAL HIGH (ref 0.61–1.24)
GFR, Estimated: 46 mL/min — ABNORMAL LOW (ref >60)
Glucose, Bld: 80 mg/dL (ref 70–99)
Potassium: 4.6 mmol/L (ref 3.5–5.1)
Sodium: 140 mmol/L (ref 135–145)
Total Bilirubin: 0.5 mg/dL (ref 0.3–1.2)
Total Protein: 6.4 g/dL — ABNORMAL LOW (ref 6.5–8.1)

## 2020-09-16 LAB — PROTIME-INR
INR: 1.9 — ABNORMAL HIGH (ref 0.8–1.2)
Prothrombin Time: 21.4 seconds — ABNORMAL HIGH (ref 11.4–15.2)

## 2020-09-16 LAB — HEPATITIS B SURFACE ANTIGEN: Hepatitis B Surface Ag: NONREACTIVE

## 2020-09-16 LAB — HEPATITIS B CORE ANTIBODY, TOTAL: Hep B Core Total Ab: NONREACTIVE

## 2020-09-16 MED ORDER — ACALABRUTINIB 100 MG PO CAPS: 100.0000 mg | ORAL_CAPSULE | Freq: Two times a day (BID) | ORAL | 0 refills | Status: DC

## 2020-09-16 NOTE — Progress Notes (Addendum)
[place] OFFICE PROGRESS NOTE   Diagnosis: CLL, anticoagulation therapy  INTERVAL HISTORY:   Chad Gross returns as scheduled.  No fevers or sweats.  Good appetite.  No change in lymph nodes.  Objective:  Vital signs in last 24 hours:  Blood pressure (!) 144/65, pulse 77, temperature 97.9 F (36.6 C), resp. rate 18, height _0  (1.803 m), weight 197 lb 1.6 oz (89.4 kg), SpO2 97 %.    HEENT: No thrush or ulcers. Lymphatics: Bilateral cervical/scalene nodes, largest approximately 2 cm left low neck/scalene region.  Bilateral 2 to 3 cm axillary lymph nodes right greater than left.  Small bilateral inguinal nodes. Resp: Lungs clear bilaterally. Cardio: Regular rate and rhythm. GI: Masslike fullness mid upper abdomen. Vascular: Trace edema lower legs bilaterally right greater than left.    Lab Results:  Lab Results  Component Value Date   WBC 155.8 (HH) 09/16/2020   HGB 9.6 (L) 09/16/2020   HCT 33.0 (L) 09/16/2020   MCV 106.5 (H) 09/16/2020   PLT 75 (L) 09/16/2020   NEUTROABS 2.4 09/16/2020    Imaging:  No results found.  Medications: I have reviewed the patient's current medications.  Assessment/Plan: 1. 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 He will return for a monthly PT check.  2. Indwelling IVC catheter.  3. 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  4. 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 erectile dysfunction-followed by Dr. Jeffie Pollock , status post placement of a penile implant at [facility] in April 2014  7. CT of the abdomen/pelvis at [facility] urology February 2014 for evaluation of hematuria-12 mm external iliac nodes and leftperiaortic retroperitoneal lymph node-11 mm-potentially related to CLL 8.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,  peripheral lymphocytosis and small palpable lymph nodes  CT abdomen/pelvis 09/18/2019-extensive abdominal pelvic lymphadenopathy including a left periaortic node measuring 5.7 cm  08/13/2020 CLL FISH panel-no evidence of trisomy 12; no evidence of [id number]; no evidence of p53 deletion or amplification; deletion of ATM present 9. Basal cell carcinoma removed from the left superior central forehead 01/21/2020   Disposition: Chad Gross appears unchanged.  He understands Dr. Benay Spice recommends initiating treatment of the CLL due to progressive anemia/thrombocytopenia and lymphadenopathy.  Dr. Benay Spice discussed treatment options to include bendamustine/Rituxan versus BTK inhibitor therapy.  [name] prefers to begin an oral agent rather than IV.  We reviewed potential toxicities associated with acalabrutinib including rash, arthralgias, atrial fibrillation, bruising/bleeding, pneumonitis, reactivation of hepatitis B.  He agrees to proceed.  He will return to the lab today for hepatitis B serologies.  He is interested in a second opinion.  We made a referral to Dr. Evelene Croon at [facility].  We anticipate he will begin acalabrutinib 09/22/2020.  He will return for lab and follow-up on 10/01/2020.  We reviewed the PT/INR from today.  He will continue Coumadin at the current dose.  We discussed the potential for an interaction between Coumadin and acalabrutinib.  We will repeat the PT/INR when he returns 10/01/2020.  He understands to contact the office with bleeding.  Patient seen with Dr. Benay Spice.  [name] ANP/GNP-BC   09/16/2020  1:17 PM This was a shared visit with [name].  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  He has developed progressive anemia/thrombocytopenia and lymphadenopathy.  I recommend beginning systemic therapy.  We reviewed results of the CLL FISH panel.  He is a  candidate for treatment with bendamustine/rituximab.  He does not wish to consider IV treatment at  present.  The plan is to begin treatment with acalabrutinib.  He requests a second opinion.  We will make referral to [facility].  Julieanne Manson, MD

## 2020-09-17 ENCOUNTER — Encounter: Payer: Self-pay | Admitting: Nurse Practitioner

## 2020-09-17 ENCOUNTER — Telehealth: Payer: Self-pay

## 2020-09-17 ENCOUNTER — Telehealth: Payer: Self-pay | Admitting: Pharmacist

## 2020-09-17 ENCOUNTER — Telehealth: Payer: Self-pay | Admitting: Nurse Practitioner

## 2020-09-17 NOTE — Telephone Encounter (Signed)
Oral Oncology Patient Advocate Encounter   Was successful in securing patient an $45 grant from Patient [facility]) to provide copayment coverage for Calquence.  This will keep the out of pocket expense at $0.     I have spoken with the patient.    The billing information is as follows and has been shared with [place].   Member ID: [id number] Group ID: [id number] RxBin: [id number] Dates of Eligibility: 06/19/20 through 09/16/21  Fund:  [place] Patient [place] Phone [phone] Fax [phone] 09/17/2020 11:58 AM

## 2020-09-17 NOTE — Telephone Encounter (Signed)
Oral Oncology Pharmacist Encounter  Received new prescription for Calquence (acalabrutinib) for the treatment of chronic lymphocytic leukemia without del(17p)/TP53 mutation, planned duration until disease progression or unacceptable toxicity.  CBC and CMP from 09/16/20 assessed, WBC elevated, RBC, Hgb, platelets below normal, likely secondary to disease. Hepatitis screening non reactive. Appropriate to initiate therapy, no dose adjustments required.  Current medication list in Epic reviewed, DDIs with Calquence identified:  Calcium carbonate and other antacids may decrease concentration of Calquence. Patient should separate Calequence from any antacids by at least 2 hours.  Warfarin's anticoagulant effects may be increased by Calquence. Monitor for signs of bleeding. No dose adjustments required.  Evaluated chart and no patient barriers to medication adherence noted.   Prescription has been e-scribed to the [facility] for benefits analysis and approval.  Oral Oncology Clinic will continue to follow for insurance authorization, copayment issues, initial counseling and start date.  Fara Olden, PharmD PGY-1 Pharmacy Resident 09/17/2020 10:36 AM Oral Oncology Clinic [phone]

## 2020-09-17 NOTE — Telephone Encounter (Signed)
Oral Oncology Patient Advocate Encounter  Was successful in securing patient a $8000 grant from [facility] to provide copayment coverage for Calquence.  This will keep the out of pocket expense at $0.     [employer] ID: [id number]  I have spoken with the patient.   The billing information is as follows and has been shared with [phone].    RxBin: [id number] PCN: [id number] Member ID: [id number] Group ID: [id number] Dates of Eligibility: 08/18/20 through 08/17/21  Fund:  [place] Patient [place] Phone [phone] Fax [phone] 09/17/2020 11:49 AM

## 2020-09-17 NOTE — Telephone Encounter (Signed)
Scheduled appointments per 10/19 los. Spoke to patient who is aware of appointments dates and times.

## 2020-09-17 NOTE — Telephone Encounter (Signed)
Oral Oncology Patient Advocate Encounter  Prior Authorization for Calquence has been approved.    PA# [id number] Effective dates: 09/17/20 through until further notice  Patients co-pay is [place] [facility] will continue to follow.   [place] Patient [place] Phone [phone] Fax [phone] 09/17/2020 11:29 AM

## 2020-09-17 NOTE — Telephone Encounter (Signed)
Oral Oncology Patient Advocate Encounter  Received notification from [facility] that prior authorization for Calquence is required.  PA submitted on CoverMyMeds Key [id number] Status is pending  Oral Oncology Clinic will continue to follow.  [place] Patient [place] Phone [phone] Fax [phone] 09/17/2020 10:26 AM

## 2020-09-18 ENCOUNTER — Telehealth: Payer: Self-pay | Admitting: Oncology

## 2020-09-18 ENCOUNTER — Telehealth: Payer: Self-pay | Admitting: *Deleted

## 2020-09-18 ENCOUNTER — Encounter: Payer: Self-pay | Admitting: Oncology

## 2020-09-18 NOTE — Telephone Encounter (Signed)
Released records for referral to brandi reeves md at [facility] to [phone]    Release:  [phone]

## 2020-09-18 NOTE — Telephone Encounter (Signed)
Informed him to hold the senior mvi when he starts the acalabrutinib. He is not sure he will start that treatment. Will have his decision sometime tomorrow.

## 2020-09-18 NOTE — Telephone Encounter (Signed)
-----   Message from Owens Shark, NP sent at 09/18/2020  3:47 PM EDT ----- Please ask him to hold the senior vitamin while on acalabrutinib

## 2020-09-18 NOTE — Telephone Encounter (Signed)
Rescheduled appointment per 10/20 Inbasket message from [facility] Called patient, no answer. Left message for patient with updated appointment date and time.

## 2020-09-22 ENCOUNTER — Encounter: Payer: Self-pay | Admitting: *Deleted

## 2020-09-24 ENCOUNTER — Inpatient Hospital Stay: Payer: MEDICARE | Admitting: Nutrition

## 2020-09-24 ENCOUNTER — Encounter: Payer: PRIVATE HEALTH INSURANCE | Admitting: Nutrition

## 2020-09-24 ENCOUNTER — Other Ambulatory Visit: Payer: Self-pay

## 2020-09-24 NOTE — Progress Notes (Signed)
74 year old male diagnosed with CLL. He is a patient of Dr. Benay Spice.  Past medical history includes PE, hypertension, hyperlipidemia, DVT.  Medications include Coumadin.  Labs include BUN 26 and creatinine 1.49 on October 19.  Height: 5 feet 11 inches. Weight: 197.1 pounds on October 19. Usual body weight: 209 pounds in March 2021. Per patient, weight loss was intentional. BMI: 27.49.  Patient reports good appetite and no difficulties eating. He has no nutrition impact symptoms. He has questions about what to eat during treatment.  Nutrition diagnosis: Food and nutrition related knowledge deficit related to CLL and associated treatments as evidenced by no prior need for nutrition related information.  Intervention: Patient educated to consume small, frequent meals and snacks with adequate calories and protein for weight maintenance. Reviewed sources of high-protein foods. Provided fact sheets on high-calorie, high-protein foods. Reviewed importance of adequate fluid intake. Questions were answered. Teach back method used. Contact information was provided.  Monitoring, evaluation, goals: Patient will tolerate adequate calories and protein throughout treatment for weight maintenance.  Next visit: Patient will contact me for follow-up as needed. He has my contact information.  **Disclaimer: This note was dictated with voice recognition software. Similar sounding words can inadvertently be transcribed and this note may contain transcription errors which may not have been corrected upon publication of note.**

## 2020-10-01 ENCOUNTER — Other Ambulatory Visit: Payer: Self-pay

## 2020-10-01 ENCOUNTER — Inpatient Hospital Stay (HOSPITAL_BASED_OUTPATIENT_CLINIC_OR_DEPARTMENT_OTHER): Payer: MEDICARE | Admitting: Oncology

## 2020-10-01 ENCOUNTER — Inpatient Hospital Stay: Payer: MEDICARE | Attending: Oncology

## 2020-10-01 VITALS — BP 136/71 | HR 90 | Temp 97.7°F | Resp 18 | Ht 71.0 in | Wt 191.4 lb

## 2020-10-01 DIAGNOSIS — Z5111 Encounter for antineoplastic chemotherapy: Secondary | ICD-10-CM | POA: Diagnosis present

## 2020-10-01 DIAGNOSIS — R319 Hematuria, unspecified: Secondary | ICD-10-CM | POA: Insufficient documentation

## 2020-10-01 DIAGNOSIS — C9110 Chronic lymphocytic leukemia of B-cell type not having achieved remission: Secondary | ICD-10-CM | POA: Diagnosis present

## 2020-10-01 DIAGNOSIS — Z7189 Other specified counseling: Secondary | ICD-10-CM | POA: Diagnosis not present

## 2020-10-01 DIAGNOSIS — Z85828 Personal history of other malignant neoplasm of skin: Secondary | ICD-10-CM | POA: Insufficient documentation

## 2020-10-01 DIAGNOSIS — N289 Disorder of kidney and ureter, unspecified: Secondary | ICD-10-CM | POA: Diagnosis not present

## 2020-10-01 DIAGNOSIS — N529 Male erectile dysfunction, unspecified: Secondary | ICD-10-CM | POA: Diagnosis not present

## 2020-10-01 DIAGNOSIS — Z79899 Other long term (current) drug therapy: Secondary | ICD-10-CM | POA: Diagnosis not present

## 2020-10-01 DIAGNOSIS — D696 Thrombocytopenia, unspecified: Secondary | ICD-10-CM | POA: Diagnosis not present

## 2020-10-01 LAB — CMP (CANCER CENTER ONLY)
ALT: 11 U/L (ref 0–44)
AST: 24 U/L (ref 15–41)
Albumin: 4.2 g/dL (ref 3.5–5.0)
Alkaline Phosphatase: 48 U/L (ref 38–126)
Anion gap: 10 (ref 5–15)
BUN: 24 mg/dL — ABNORMAL HIGH (ref 8–23)
CO2: 27 mmol/L (ref 22–32)
Calcium: 9.3 mg/dL (ref 8.9–10.3)
Chloride: 107 mmol/L (ref 98–111)
Creatinine: 1.59 mg/dL — ABNORMAL HIGH (ref 0.61–1.24)
GFR, Estimated: 45 mL/min — ABNORMAL LOW (ref >60)
Glucose, Bld: 122 mg/dL — ABNORMAL HIGH (ref 70–99)
Potassium: 3.8 mmol/L (ref 3.5–5.1)
Sodium: 144 mmol/L (ref 135–145)
Total Bilirubin: 0.5 mg/dL (ref 0.3–1.2)
Total Protein: 6.6 g/dL (ref 6.5–8.1)

## 2020-10-01 LAB — CBC WITH DIFFERENTIAL (CANCER CENTER ONLY)
Abs Immature Granulocytes: 0.33 10*3/uL — ABNORMAL HIGH (ref 0.00–0.07)
Basophils Absolute: 0.1 10*3/uL (ref 0.0–0.1)
Basophils Relative: 0 %
Eosinophils Absolute: 0.2 10*3/uL (ref 0.0–0.5)
Eosinophils Relative: 0 %
HCT: 33.7 % — ABNORMAL LOW (ref 39.0–52.0)
Hemoglobin: 9.6 g/dL — ABNORMAL LOW (ref 13.0–17.0)
Immature Granulocytes: 0 %
Lymphocytes Relative: 96 %
Lymphs Abs: 160.9 10*3/uL — ABNORMAL HIGH (ref 0.7–4.0)
MCH: 30.8 pg (ref 26.0–34.0)
MCHC: 28.5 g/dL — ABNORMAL LOW (ref 30.0–36.0)
MCV: 108 fL — ABNORMAL HIGH (ref 80.0–100.0)
Monocytes Absolute: 2.8 10*3/uL — ABNORMAL HIGH (ref 0.1–1.0)
Monocytes Relative: 2 %
Neutro Abs: 2.5 10*3/uL (ref 1.7–7.7)
Neutrophils Relative %: 2 %
Platelet Count: 71 10*3/uL — ABNORMAL LOW (ref 150–400)
RBC: 3.12 MIL/uL — ABNORMAL LOW (ref 4.22–5.81)
RDW: 19.8 % — ABNORMAL HIGH (ref 11.5–15.5)
WBC Count: 166.7 10*3/uL (ref 4.0–10.5)
nRBC: 0.1 % (ref 0.0–0.2)

## 2020-10-01 LAB — PROTIME-INR
INR: 1.8 — ABNORMAL HIGH (ref 0.8–1.2)
Prothrombin Time: 20.3 seconds — ABNORMAL HIGH (ref 11.4–15.2)

## 2020-10-01 LAB — URIC ACID: Uric Acid, Serum: 6.3 mg/dL (ref 3.7–8.6)

## 2020-10-01 MED ORDER — ALLOPURINOL 100 MG PO TABS: 100.0000 mg | ORAL_TABLET | Freq: Every day | ORAL | 0 refills | Status: DC

## 2020-10-01 NOTE — Progress Notes (Signed)
START OFF PATHWAY REGIMEN - Lymphoma and CLL   [id number]:Bendamustine + Rituximab (70/500) q28 Days:   A cycle is every 28 days:     Bendamustine      Rituximab-xxxx      Rituximab-xxxx   **Always confirm dose/schedule in your pharmacy ordering system**  Patient Characteristics: Chronic Lymphocytic Leukemia (CLL), Treatment Indicated, First Line, 17p del (+) or ATM Mutation Positive or TP53 Mutation Positive Disease Type: Chronic Lymphocytic Leukemia (CLL) Disease Type: Not Applicable Disease Type: Not Applicable Treatment Indicated<= Treatment Indicated Line of Therapy: First Line ATM Mutation Status: Positive 17p Deletion Status: Negative TP53 Mutation Status: Negative Intent of Therapy: Non-Curative / Palliative Intent, Discussed with Patient

## 2020-10-01 NOTE — Progress Notes (Signed)
[facility] OFFICE PROGRESS NOTE   Diagnosis: CLL, anticoagulation therapy  INTERVAL HISTORY:   Chad Gross returns as scheduled.  No new complaint.  No fever, night sweats, or current abdominal pain.  He did not start at acalabrutinib as planned.  He reviewed toxicities and the treatment schedule in comparison to bendamustine/rituximab.  He prefers bendamustine/rituximab.  He has attended a chemotherapy teaching class.  He is scheduled for a telehealth visit with Dr. Prince Gross from [facility] next week.  He would like to begin bendamustine/rituximab on 10/08/2020.  Objective:  Vital signs in last 24 hours:  Blood pressure 136/71, pulse 90, temperature 97.7 F (36.5 C), temperature source Tympanic, resp. rate 18, height '5\' 11"'  (1.803 m), weight 191 lb 6.4 oz (86.8 kg), SpO2 99 %.    Lymphatics: 1-2 cm cervical and axillary nodes Resp: Lungs clear bilaterally Cardio: Regular rate and rhythm GI: No hepatosplenomegaly, mid upper abdominal mass without associated tenderness Vascular: No leg edema    Portacath/PICC-without erythema  Lab Results:  Lab Results  Component Value Date   WBC 166.7 (HH) 10/01/2020   HGB 9.6 (L) 10/01/2020   HCT 33.7 (L) 10/01/2020   MCV 108.0 (H) 10/01/2020   PLT 71 (L) 10/01/2020   NEUTROABS 2.5 10/01/2020    CMP  Lab Results  Component Value Date   NA 144 10/01/2020   K 3.8 10/01/2020   CL 107 10/01/2020   CO2 27 10/01/2020   GLUCOSE 122 (H) 10/01/2020   BUN 24 (H) 10/01/2020   CREATININE 1.59 (H) 10/01/2020   CALCIUM 9.3 10/01/2020   PROT 6.6 10/01/2020   ALBUMIN 4.2 10/01/2020   AST 24 10/01/2020   ALT 11 10/01/2020   ALKPHOS 48 10/01/2020   BILITOT 0.5 10/01/2020   GFRNONAA 45 (L) 10/01/2020   GFRAA 52 (L) 08/13/2020     Lab Results  Component Value Date   INR 1.8 (H) 10/01/2020     Medications: I have reviewed the patient's current medications.   Assessment/Plan: 1. History of recurrent venous thromboembolism,  maintained on indefinite Coumadin anticoagulation. He will return for a monthly PT check.  2. Indwelling IVC catheter.  3. 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  4. 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 erectile dysfunction-followed by Dr. [name] , status post placement of a penile implant at [facility] in April 2014  7. CT of the abdomen/pelvis at [facility] urology February 2014 for evaluation of hematuria-12 mm external iliac nodes and leftperiaortic retroperitoneal lymph node-11 mm-potentially related to CLL 8.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, peripheral lymphocytosis and small palpable lymph nodes  CT abdomen/pelvis 09/18/2019-extensive abdominal pelvic lymphadenopathy including a left periaortic node measuring 5.7 cm  08/13/2020 CLL FISH panel-no evidence of trisomy 12; no evidence of [id number]; no evidence of p53 deletion or amplification; deletion of ATM present 9. Basal cell carcinoma removed from the left superior central forehead 01/21/2020    10.  Renal insufficiency     Disposition: [name] appears unchanged.  He has persistent anemia/thrombocytopenia and lymphocytosis.  There are palpable peripheral lymph nodes and an abdominal mass.  I recommend initiating systemic therapy.  We had previously planned acalabrutinib therapy, but he is more comfortable with bendamustine/rituximab.  His renal function is borderline, but appears adequate to receive bendamustine.  We reviewed potential toxicities associated with bendamustine including the chance of nausea/vomiting, rash, hematologic toxicity, and pneumonitis.  We discussed  the allergic reaction, pneumonitis, CNS toxicity, infection, and hematologic toxicity associated with rituximab.  He agrees to proceed.  The plan is to initiate bendamustine/rituximab on  10/08/2020.  He will return for an office visit and a nadir CBC on 10/22/2020.  He will begin allopurinol on 10/07/2020.  The renal insufficiency is likely related to hypertension.  There was no hydronephrosis on a CT last year and the creatinine has not changed significantly over the past year.  He will continue Coumadin at the current dose.  Chad Coder, MD  10/01/2020  1:41 PM

## 2020-10-02 ENCOUNTER — Other Ambulatory Visit: Payer: Self-pay | Admitting: Pharmacist

## 2020-10-02 ENCOUNTER — Telehealth: Payer: Self-pay | Admitting: Oncology

## 2020-10-02 NOTE — Telephone Encounter (Signed)
Appointments for 11/10 and 11/11 scheduled in [place] per secure chat with Donita Brooks. Okay per MD. Scheduled additional appointments per 11/3 los. Called patient, no answer. Left message for patient with appointment dates and times and locations in [place].

## 2020-10-02 NOTE — Progress Notes (Signed)
Pharmacist Chemotherapy Monitoring - Initial Assessment    Anticipated start date: 10/08/20  Regimen:   Are orders appropriate based on the patients diagnosis, regimen, and cycle? Yes  Does the plan date match the patients scheduled date? Yes  Is the sequencing of drugs appropriate? Yes  Are the premedications appropriate for the patients regimen? Yes  Prior Authorization for treatment is: Approved o If applicable, is the correct biosimilar selected based on the patient's insurance? yes  Organ Function and Labs:  Are dose adjustments needed based on the patient's renal function, hepatic function, or hematologic function? No  Are appropriate labs ordered prior to the start of patient's treatment? Yes  Other organ system assessment, if indicated: N/A  The following baseline labs, if indicated, have been ordered: rituximab: baseline Hepatitis B labs  Dose Assessment:  Are the drug doses appropriate? Yes  Are the following correct: o Drug concentrations Yes o IV fluid compatible with drug Yes o Administration routes Yes o Timing of therapy Yes  If applicable, does the patient have documented access for treatment and/or plans for port-a-cath placement? yes  If applicable, have lifetime cumulative doses been properly documented and assessed? not applicable Lifetime Dose Tracking  No doses have been documented on this patient for the following tracked chemicals: Doxorubicin, Epirubicin, Idarubicin, Daunorubicin, Mitoxantrone, Bleomycin, Oxaliplatin, Carboplatin, Liposomal Doxorubicin  o   Toxicity Monitoring/Prevention:   The patient has the following take home antiemetics prescribed: N/A   None ordered, messaged Dr. Benay Spice.   The patient has the following take home medications prescribed: N/A  Medication allergies and previous infusion related reactions, if applicable, have been reviewed and addressed. Yes  The patient's current medication list has been assessed for  drug-drug interactions with their chemotherapy regimen. no significant drug-drug interactions were identified on review.  Order Review:  Are the treatment plan orders signed? No  Is the patient scheduled to see a provider prior to their treatment? No  I verify that I have reviewed each item in the above checklist and answered each question accordingly.  Lourine Alberico, Jacqlyn Larsen 10/02/2020 11:55 AM

## 2020-10-05 ENCOUNTER — Other Ambulatory Visit: Payer: Self-pay | Admitting: Oncology

## 2020-10-08 ENCOUNTER — Inpatient Hospital Stay: Payer: MEDICARE

## 2020-10-08 ENCOUNTER — Encounter: Payer: Self-pay | Admitting: Oncology

## 2020-10-08 ENCOUNTER — Other Ambulatory Visit: Payer: Self-pay

## 2020-10-08 VITALS — BP 116/50 | HR 100 | Temp 99.6°F | Resp 17

## 2020-10-08 DIAGNOSIS — Z5111 Encounter for antineoplastic chemotherapy: Secondary | ICD-10-CM | POA: Diagnosis not present

## 2020-10-08 DIAGNOSIS — C9110 Chronic lymphocytic leukemia of B-cell type not having achieved remission: Secondary | ICD-10-CM

## 2020-10-08 MED ORDER — PALONOSETRON HCL INJECTION 0.25 MG/5ML
0.2500 mg | Freq: Once | INTRAVENOUS | Status: AC
  Administered 2020-10-08: 0.25 mg via INTRAVENOUS

## 2020-10-08 MED ORDER — SODIUM CHLORIDE 0.9 % IV SOLN
375.0000 mg/m2 | Freq: Once | INTRAVENOUS | Status: AC
  Administered 2020-10-08: 800 mg via INTRAVENOUS
  Filled 2020-10-08: qty 50

## 2020-10-08 MED ORDER — DIPHENHYDRAMINE HCL 25 MG PO CAPS
50.0000 mg | ORAL_CAPSULE | Freq: Once | ORAL | Status: AC
  Administered 2020-10-08: 50 mg via ORAL

## 2020-10-08 MED ORDER — SODIUM CHLORIDE 0.9 % IV SOLN
Freq: Once | INTRAVENOUS | Status: AC
  Filled 2020-10-08: qty 250

## 2020-10-08 MED ORDER — SODIUM CHLORIDE 0.9 % IV SOLN
10.0000 mg | Freq: Once | INTRAVENOUS | Status: AC
  Administered 2020-10-08: 10 mg via INTRAVENOUS
  Filled 2020-10-08: qty 10

## 2020-10-08 MED ORDER — PALONOSETRON HCL INJECTION 0.25 MG/5ML
INTRAVENOUS | Status: AC
  Filled 2020-10-08: qty 5

## 2020-10-08 MED ORDER — SODIUM CHLORIDE 0.9 % IV SOLN
70.0000 mg/m2 | Freq: Once | INTRAVENOUS | Status: AC
  Administered 2020-10-08: 150 mg via INTRAVENOUS
  Filled 2020-10-08: qty 6

## 2020-10-08 MED ORDER — ACETAMINOPHEN 325 MG PO TABS
ORAL_TABLET | ORAL | Status: AC
  Filled 2020-10-08: qty 2

## 2020-10-08 MED ORDER — DIPHENHYDRAMINE HCL 25 MG PO CAPS
ORAL_CAPSULE | ORAL | Status: AC
  Filled 2020-10-08: qty 2

## 2020-10-08 MED ORDER — ACETAMINOPHEN 325 MG PO TABS
650.0000 mg | ORAL_TABLET | Freq: Once | ORAL | Status: AC
  Administered 2020-10-08: 650 mg via ORAL

## 2020-10-08 NOTE — Progress Notes (Signed)
Pt discharged in no apparent distress. Pt left ambulatory without assistance. Pt aware of discharge instructions and verbalized understanding and had no further questions.  

## 2020-10-08 NOTE — Patient Instructions (Addendum)
[facility] Discharge Instructions for Patients Receiving Chemotherapy  Today you received the following chemotherapy agents Rituximab, Bendamustine  To help prevent nausea and vomiting after your treatment, we encourage you to take your nausea medication as prescribed by MD. **DO NOT TAKE ZOFRAN FOR 3 DAYS AFTER CHEMOTHERAPY**   If you develop nausea and vomiting that is not controlled by your nausea medication, call the clinic.   BELOW ARE SYMPTOMS THAT SHOULD BE REPORTED IMMEDIATELY:  *FEVER GREATER THAN 100.5 F  *CHILLS WITH OR WITHOUT FEVER  NAUSEA AND VOMITING THAT IS NOT CONTROLLED WITH YOUR NAUSEA MEDICATION  *UNUSUAL SHORTNESS OF BREATH  *UNUSUAL BRUISING OR BLEEDING  TENDERNESS IN MOUTH AND THROAT WITH OR WITHOUT PRESENCE OF ULCERS  *URINARY PROBLEMS  *BOWEL PROBLEMS  UNUSUAL RASH Items with * indicate a potential emergency and should be followed up as soon as possible.  Feel free to call the clinic should you have any questions or concerns. The clinic phone number is [phone].  Please show the [place] at check-in to the Emergency Department and triage nurse.  Palonosetron Injection What is this medicine? PALONOSETRON (pal oh NOE se tron) is used to prevent nausea and vomiting caused by chemotherapy. It also helps prevent delayed nausea and vomiting that may occur a few days after your treatment. This medicine may be used for other purposes; ask your health care provider or pharmacist if you have questions. COMMON BRAND NAME(S): Aloxi What should I tell my health care provider before I take this medicine? They need to know if you have any of these conditions:  an unusual or allergic reaction to palonosetron, dolasetron, granisetron, ondansetron, other medicines, foods, dyes, or preservatives  pregnant or trying to get pregnant  breast-feeding How should I use this medicine? This medicine is for infusion into a vein. It is given  by a health care professional in a hospital or clinic setting. Talk to your pediatrician regarding the use of this medicine in children. While this drug may be prescribed for children as young as 1 month for selected conditions, precautions do apply. Overdosage: If you think you have taken too much of this medicine contact a poison control center or emergency room at once. NOTE: This medicine is only for you. Do not share this medicine with others. What if I miss a dose? This does not apply. What may interact with this medicine?  certain medicines for depression, anxiety, or psychotic disturbances  fentanyl  linezolid  MAOIs like Carbex, Eldepryl, Marplan, Nardil, and Parnate  methylene blue (injected into a vein)  tramadol This list may not describe all possible interactions. Give your health care provider a list of all the medicines, herbs, non-prescription drugs, or dietary supplements you use. Also tell them if you smoke, drink alcohol, or use illegal drugs. Some items may interact with your medicine. What should I watch for while using this medicine? Your condition will be monitored carefully while you are receiving this medicine. What side effects may I notice from receiving this medicine? Side effects that you should report to your doctor or health care professional as soon as possible:  allergic reactions like skin rash, itching or hives, swelling of the face, lips, or tongue  breathing problems  confusion  dizziness  fast, irregular heartbeat  fever and chills  loss of balance or coordination  seizures  sweating  swelling of the hands and feet  tremors  unusually weak or tired Side effects that usually do not require  medical attention (report to your doctor or health care professional if they continue or are bothersome):  constipation or diarrhea  headache This list may not describe all possible side effects. Call your doctor for medical advice about side  effects. You may report side effects to FDA at 1-800-FDA-1088. Where should I keep my medicine? This drug is given in a hospital or clinic and will not be stored at home. NOTE: This sheet is a summary. It may not cover all possible information. If you have questions about this medicine, talk to your doctor, pharmacist, or health care provider.  2020 Elsevier/Gold Standard (2013-09-21 10:38:36) Diphenhydramine capsules or tablets What is this medicine? DIPHENHYDRAMINE (dye fen HYE dra meen) is an antihistamine. It is used to treat the symptoms of an allergic reaction. It is also used to treat Parkinson's disease. This medicine is also used to prevent and to treat motion sickness and as a nighttime sleep aid. This medicine may be used for other purposes; ask your health care provider or pharmacist if you have questions. COMMON BRAND NAME(S): Alka-Seltzer Plus Allergy, Aller-G-Time, Banophen, Benadryl Allergy, Benadryl Allergy Dye Free, Benadryl Allergy Kapgel, Benadryl Allergy Ultratab, Diphedryl, Diphenhist, Genahist, Geri-Dryl, PHARBEDRYL, Q-Dryl, Gretta Began, Valu-Dryl, Vicks ZzzQuil Nightime Sleep-Aid What should I tell my health care provider before I take this medicine? They need to know if you have any of these conditions:  asthma or lung disease  glaucoma  high blood pressure or heart disease  liver disease  pain or difficulty passing urine  prostate trouble  ulcers or other stomach problems  an unusual or allergic reaction to diphenhydramine, other medicines foods, dyes, or preservatives such as sulfites  pregnant or trying to get pregnant  breast-feeding How should I use this medicine? Take this medicine by mouth with a full glass of water. Follow the directions on the prescription label. Take your doses at regular intervals. Do not take your medicine more often than directed. To prevent motion sickness start taking this medicine 30 to 60 minutes before you leave. Talk to  your pediatrician regarding the use of this medicine in children. Special care may be needed. Patients over 60 years old may have a stronger reaction and need a smaller dose. Overdosage: If you think you have taken too much of this medicine contact a poison control center or emergency room at once. NOTE: This medicine is only for you. Do not share this medicine with others. What if I miss a dose? If you miss a dose, take it as soon as you can. If it is almost time for your next dose, take only that dose. Do not take double or extra doses. What may interact with this medicine? Do not take this medicine with any of the following medications:  MAOIs like Carbex, Eldepryl, Marplan, Nardil, and Parnate This medicine may also interact with the following medications:  alcohol  barbiturates, like phenobarbital  medicines for bladder spasm like oxybutynin, tolterodine  medicines for blood pressure  medicines for depression, anxiety, or psychotic disturbances  medicines for movement abnormalities or Parkinson's disease  medicines for sleep  other medicines for cold, cough or allergy  some medicines for the stomach like chlordiazepoxide, dicyclomine This list may not describe all possible interactions. Give your health care provider a list of all the medicines, herbs, non-prescription drugs, or dietary supplements you use. Also tell them if you smoke, drink alcohol, or use illegal drugs. Some items may interact with your medicine. What should I watch for while using this  medicine? Visit your doctor or health care professional for regular check ups. Tell your doctor if your symptoms do not improve or if they get worse. Your mouth may get dry. Chewing sugarless gum or sucking hard candy, and drinking plenty of water may help. Contact your doctor if the problem does not go away or is severe. This medicine may cause dry eyes and blurred vision. If you wear contact lenses you may feel some  discomfort. Lubricating drops may help. See your eye doctor if the problem does not go away or is severe. You may get drowsy or dizzy. Do not drive, use machinery, or do anything that needs mental alertness until you know how this medicine affects you. Do not stand or sit up quickly, especially if you are an older patient. This reduces the risk of dizzy or fainting spells. Alcohol may interfere with the effect of this medicine. Avoid alcoholic drinks. What side effects may I notice from receiving this medicine? Side effects that you should report to your doctor or health care professional as soon as possible:  allergic reactions like skin rash, itching or hives, swelling of the face, lips, or tongue  changes in vision  confused, agitated, nervous  irregular or fast heartbeat  tremor  trouble passing urine  unusual bleeding or bruising  unusually weak or tired Side effects that usually do not require medical attention (report to your doctor or health care professional if they continue or are bothersome):  constipation, diarrhea  drowsy  headache  loss of appetite  stomach upset, vomiting  thick mucous This list may not describe all possible side effects. Call your doctor for medical advice about side effects. You may report side effects to FDA at 1-800-FDA-1088. Where should I keep my medicine? Keep out of the reach of children. This medicine can be abused. Keep your medicine in a safe place. Store at room temperature between 15 and 30 degrees C (59 and 86 degrees F). Keep container closed tightly. Throw away any unused medicine after the expiration date. NOTE: This sheet is a summary. It may not cover all possible information. If you have questions about this medicine, talk to your doctor, pharmacist, or health care provider.  2020 Elsevier/Gold Standard (2019-08-24 10:18:35) Acetaminophen tablets or caplets What is this medicine? ACETAMINOPHEN (a set a MEE noe fen) is a  pain reliever. It is used to treat mild pain and fever. This medicine may be used for other purposes; ask your health care provider or pharmacist if you have questions. COMMON BRAND NAME(S): Aceta, Actamin, Anacin Aspirin Free, Genapap, Genebs, Mapap, Pain & Fever, Pain and Fever, PAIN RELIEF, PAIN RELIEF Extra Strength, Pain Reliever, Panadol, PHARBETOL, Q-Pap, Q-Pap Extra Strength, Tylenol, Tylenol CrushableTablet, Tylenol Extra Strength, XS No Aspirin, XS Pain Reliever What should I tell my health care provider before I take this medicine? They need to know if you have any of these conditions:  if you often drink alcohol  liver disease  an unusual or allergic reaction to acetaminophen, other medicines, foods, dyes, or preservatives  pregnant or trying to get pregnant  breast-feeding How should I use this medicine? Take this medicine by mouth with a glass of water. Follow the directions on the package or prescription label. Take your medicine at regular intervals. Do not take your medicine more often than directed. Talk to your pediatrician regarding the use of this medicine in children. While this drug may be prescribed for children as young as 42 years of age for  selected conditions, precautions do apply. Overdosage: If you think you have taken too much of this medicine contact a poison control center or emergency room at once. NOTE: This medicine is only for you. Do not share this medicine with others. What if I miss a dose? If you miss a dose, take it as soon as you can. If it is almost time for your next dose, take only that dose. Do not take double or extra doses. What may interact with this medicine?  alcohol  imatinib  isoniazid  other medicines with acetaminophen This list may not describe all possible interactions. Give your health care provider a list of all the medicines, herbs, non-prescription drugs, or dietary supplements you use. Also tell them if you smoke, drink  alcohol, or use illegal drugs. Some items may interact with your medicine. What should I watch for while using this medicine? Tell your doctor or health care professional if the pain lasts more than 10 days (5 days for children), if it gets worse, or if there is a new or different kind of pain. Also, check with your doctor if a fever lasts for more than 3 days. Do not take other medicines that contain acetaminophen with this medicine. Always read labels carefully. If you have questions, ask your doctor or pharmacist. If you take too much acetaminophen get medical help right away. Too much acetaminophen can be very dangerous and cause liver damage. Even if you do not have symptoms, it is important to get help right away. What side effects may I notice from receiving this medicine? Side effects that you should report to your doctor or health care professional as soon as possible:  allergic reactions like skin rash, itching or hives, swelling of the face, lips, or tongue  breathing problems  fever or sore throat  redness, blistering, peeling or loosening of the skin, including inside the mouth  trouble passing urine or change in the amount of urine  unusual bleeding or bruising  unusually weak or tired  yellowing of the eyes or skin Side effects that usually do not require medical attention (report to your doctor or health care professional if they continue or are bothersome):  headache  nausea, stomach upset This list may not describe all possible side effects. Call your doctor for medical advice about side effects. You may report side effects to FDA at 1-800-FDA-1088. Where should I keep my medicine? Keep out of reach of children. Store at room temperature between 20 and 25 degrees C (68 and 77 degrees F). Protect from moisture and heat. Throw away any unused medicine after the expiration date. NOTE: This sheet is a summary. It may not cover all possible information. If you have  questions about this medicine, talk to your doctor, pharmacist, or health care provider.  2020 Elsevier/Gold Standard (2013-07-09 12:54:16) Bendamustine Injection What is this medicine? BENDAMUSTINE (BEN da MUS teen) is a chemotherapy drug. It is used to treat chronic lymphocytic leukemia and non-Hodgkin lymphoma. This medicine may be used for other purposes; ask your health care provider or pharmacist if you have questions. COMMON BRAND NAME(S): Kristine Royal, Treanda What should I tell my health care provider before I take this medicine? They need to know if you have any of these conditions:  infection (especially a virus infection such as chickenpox, cold sores, or herpes)  kidney disease  liver disease  an unusual or allergic reaction to bendamustine, mannitol, other medicines, foods, dyes, or preservatives  pregnant or trying to  get pregnant  breast-feeding How should I use this medicine? This medicine is for infusion into a vein. It is given by a health care professional in a hospital or clinic setting. Talk to your pediatrician regarding the use of this medicine in children. Special care may be needed. Overdosage: If you think you have taken too much of this medicine contact a poison control center or emergency room at once. NOTE: This medicine is only for you. Do not share this medicine with others. What if I miss a dose? It is important not to miss your dose. Call your doctor or health care professional if you are unable to keep an appointment. What may interact with this medicine? Do not take this medicine with any of the following medications:  clozapine This medicine may also interact with the following medications:  atazanavir  cimetidine  ciprofloxacin  enoxacin  fluvoxamine  medicines for seizures like carbamazepine and phenobarbital  mexiletine  rifampin  tacrine  thiabendazole  zileuton This list may not describe all possible interactions.  Give your health care provider a list of all the medicines, herbs, non-prescription drugs, or dietary supplements you use. Also tell them if you smoke, drink alcohol, or use illegal drugs. Some items may interact with your medicine. What should I watch for while using this medicine? This drug may make you feel generally unwell. This is not uncommon, as chemotherapy can affect healthy cells as well as cancer cells. Report any side effects. Continue your course of treatment even though you feel ill unless your doctor tells you to stop. You may need blood work done while you are taking this medicine. Call your doctor or healthcare provider for advice if you get a fever, chills or sore throat, or other symptoms of a cold or flu. Do not treat yourself. This drug decreases your body's ability to fight infections. Try to avoid being around people who are sick. This medicine may cause serious skin reactions. They can happen weeks to months after starting the medicine. Contact your healthcare provider right away if you notice fevers or flu-like symptoms with a rash. The rash may be red or purple and then turn into blisters or peeling of the skin. Or, you might notice a red rash with swelling of the face, lips or lymph nodes in your neck or under your arms. This medicine may increase your risk to bruise or bleed. Call your doctor or healthcare provider if you notice any unusual bleeding. Talk to your doctor about your risk of cancer. You may be more at risk for certain types of cancers if you take this medicine. Do not become pregnant while taking this medicine or for at least 6 months after stopping it. Women should inform their doctor if they wish to become pregnant or think they might be pregnant. Men should not father a child while taking this medicine and for at least 3 months after stopping it. There is a potential for serious side effects to an unborn child. Talk to your healthcare provider or pharmacist for  more information. Do not breast-feed an infant while taking this medicine or for at least 1 week after stopping it. This medicine may make it more difficult to father a child. You should talk with your doctor or healthcare provider if you are concerned about your fertility. What side effects may I notice from receiving this medicine? Side effects that you should report to your doctor or health care professional as soon as possible:  allergic reactions  like skin rash, itching or hives, swelling of the face, lips, or tongue  low blood counts - this medicine may decrease the number of white blood cells, red blood cells and platelets. You may be at increased risk for infections and bleeding.  rash, fever, and swollen lymph nodes  redness, blistering, peeling, or loosening of the skin, including inside the mouth  signs of infection like fever or chills, cough, sore throat, pain or difficulty passing urine  signs of decreased platelets or bleeding like bruising, pinpoint red spots on the skin, black, tarry stools, blood in the urine  signs of decreased red blood cells like being unusually weak or tired, fainting spells, lightheadedness  signs and symptoms of kidney injury like trouble passing urine or change in the amount of urine  signs and symptoms of liver injury like dark yellow or brown urine; general ill feeling or flu-like symptoms; light-colored stools; loss of appetite; nausea; right upper belly pain; unusually weak or tired; yellowing of the eyes or skin Side effects that usually do not require medical attention (report to your doctor or health care professional if they continue or are bothersome):  constipation  decreased appetite  diarrhea  headache  mouth sores  nausea, vomiting  tiredness This list may not describe all possible side effects. Call your doctor for medical advice about side effects. You may report side effects to FDA at 1-800-FDA-1088. Where should I keep  my medicine? This drug is given in a hospital or clinic and will not be stored at home. NOTE: This sheet is a summary. It may not cover all possible information. If you have questions about this medicine, talk to your doctor, pharmacist, or health care provider.  2020 Elsevier/Gold Standard (2019-02-06 10:26:46) Rituximab injection What is this medicine? RITUXIMAB (ri TUX i mab) is a monoclonal antibody. It is used to treat certain types of cancer like non-Hodgkin lymphoma and chronic lymphocytic leukemia. It is also used to treat rheumatoid arthritis, granulomatosis with polyangiitis (or Wegener's granulomatosis), microscopic polyangiitis, and pemphigus vulgaris. This medicine may be used for other purposes; ask your health care provider or pharmacist if you have questions. COMMON BRAND NAME(S): Rituxan, RUXIENCE What should I tell my health care provider before I take this medicine? They need to know if you have any of these conditions:  heart disease  infection (especially a virus infection such as hepatitis B, chickenpox, cold sores, or herpes)  immune system problems  irregular heartbeat  kidney disease  low blood counts, like low white cell, platelet, or red cell counts  lung or breathing disease, like asthma  recently received or scheduled to receive a vaccine  an unusual or allergic reaction to rituximab, other medicines, foods, dyes, or preservatives  pregnant or trying to get pregnant  breast-feeding How should I use this medicine? This medicine is for infusion into a vein. It is administered in a hospital or clinic by a specially trained health care professional. A special MedGuide will be given to you by the pharmacist with each prescription and refill. Be sure to read this information carefully each time. Talk to your pediatrician regarding the use of this medicine in children. This medicine is not approved for use in children. Overdosage: If you think you have  taken too much of this medicine contact a poison control center or emergency room at once. NOTE: This medicine is only for you. Do not share this medicine with others. What if I miss a dose? It is important not to  miss a dose. Call your doctor or health care professional if you are unable to keep an appointment. What may interact with this medicine?  cisplatin  live virus vaccines This list may not describe all possible interactions. Give your health care provider a list of all the medicines, herbs, non-prescription drugs, or dietary supplements you use. Also tell them if you smoke, drink alcohol, or use illegal drugs. Some items may interact with your medicine. What should I watch for while using this medicine? Your condition will be monitored carefully while you are receiving this medicine. You may need blood work done while you are taking this medicine. This medicine can cause serious allergic reactions. To reduce your risk you may need to take medicine before treatment with this medicine. Take your medicine as directed. In some patients, this medicine may cause a serious brain infection that may cause death. If you have any problems seeing, thinking, speaking, walking, or standing, tell your healthcare professional right away. If you cannot reach your healthcare professional, urgently seek other source of medical care. Call your doctor or health care professional for advice if you get a fever, chills or sore throat, or other symptoms of a cold or flu. Do not treat yourself. This drug decreases your body's ability to fight infections. Try to avoid being around people who are sick. Do not become pregnant while taking this medicine or for at least 12 months after stopping it. Women should inform their doctor if they wish to become pregnant or think they might be pregnant. There is a potential for serious side effects to an unborn child. Talk to your health care professional or pharmacist for more  information. Do not breast-feed an infant while taking this medicine or for at least 6 months after stopping it. What side effects may I notice from receiving this medicine? Side effects that you should report to your doctor or health care professional as soon as possible:  allergic reactions like skin rash, itching or hives; swelling of the face, lips, or tongue  breathing problems  chest pain  changes in vision  diarrhea  headache with fever, neck stiffness, sensitivity to light, nausea, or confusion  fast, irregular heartbeat  loss of memory  low blood counts - this medicine may decrease the number of white blood cells, red blood cells and platelets. You may be at increased risk for infections and bleeding.  mouth sores  problems with balance, talking, or walking  redness, blistering, peeling or loosening of the skin, including inside the mouth  signs of infection - fever or chills, cough, sore throat, pain or difficulty passing urine  signs and symptoms of kidney injury like trouble passing urine or change in the amount of urine  signs and symptoms of liver injury like dark yellow or brown urine; general ill feeling or flu-like symptoms; light-colored stools; loss of appetite; nausea; right upper belly pain; unusually weak or tired; yellowing of the eyes or skin  signs and symptoms of low blood pressure like dizziness; feeling faint or lightheaded, falls; unusually weak or tired  stomach pain  swelling of the ankles, feet, hands  unusual bleeding or bruising  vomiting Side effects that usually do not require medical attention (report to your doctor or health care professional if they continue or are bothersome):  headache  joint pain  muscle cramps or muscle pain  nausea  tiredness This list may not describe all possible side effects. Call your doctor for medical advice about side effects. You  may report side effects to FDA at 1-800-FDA-1088. Where should I  keep my medicine? This drug is given in a hospital or clinic and will not be stored at home. NOTE: This sheet is a summary. It may not cover all possible information. If you have questions about this medicine, talk to your doctor, pharmacist, or health care provider.  2020 Elsevier/Gold Standard (2018-12-27 22:01:36)

## 2020-10-08 NOTE — Progress Notes (Signed)
OK to treat today with labs from 10/01/20 per Dr. Benay Spice.

## 2020-10-09 ENCOUNTER — Inpatient Hospital Stay: Payer: MEDICARE

## 2020-10-09 VITALS — BP 129/77 | HR 93 | Temp 98.3°F

## 2020-10-09 DIAGNOSIS — C9110 Chronic lymphocytic leukemia of B-cell type not having achieved remission: Secondary | ICD-10-CM

## 2020-10-09 DIAGNOSIS — Z5111 Encounter for antineoplastic chemotherapy: Secondary | ICD-10-CM | POA: Diagnosis not present

## 2020-10-09 MED ORDER — SODIUM CHLORIDE 0.9 % IV SOLN
70.0000 mg/m2 | Freq: Once | INTRAVENOUS | Status: AC
  Administered 2020-10-09: 150 mg via INTRAVENOUS
  Filled 2020-10-09: qty 6

## 2020-10-09 MED ORDER — SODIUM CHLORIDE 0.9 % IV SOLN
Freq: Once | INTRAVENOUS | Status: AC
  Filled 2020-10-09: qty 250

## 2020-10-09 MED ORDER — SODIUM CHLORIDE 0.9 % IV SOLN
10.0000 mg | Freq: Once | INTRAVENOUS | Status: AC
  Administered 2020-10-09: 10 mg via INTRAVENOUS
  Filled 2020-10-09: qty 10

## 2020-10-09 NOTE — Patient Instructions (Signed)
Bendamustine Injection What is this medicine? BENDAMUSTINE (BEN da MUS teen) is a chemotherapy drug. It is used to treat chronic lymphocytic leukemia and non-Hodgkin lymphoma. This medicine may be used for other purposes; ask your health care provider or pharmacist if you have questions. COMMON BRAND NAME(S): Kristine Royal, Treanda What should I tell my health care provider before I take this medicine? They need to know if you have any of these conditions:  infection (especially a virus infection such as chickenpox, cold sores, or herpes)  kidney disease  liver disease  an unusual or allergic reaction to bendamustine, mannitol, other medicines, foods, dyes, or preservatives  pregnant or trying to get pregnant  breast-feeding How should I use this medicine? This medicine is for infusion into a vein. It is given by a health care professional in a hospital or clinic setting. Talk to your pediatrician regarding the use of this medicine in children. Special care may be needed. Overdosage: If you think you have taken too much of this medicine contact a poison control center or emergency room at once. NOTE: This medicine is only for you. Do not share this medicine with others. What if I miss a dose? It is important not to miss your dose. Call your doctor or health care professional if you are unable to keep an appointment. What may interact with this medicine? Do not take this medicine with any of the following medications:  clozapine This medicine may also interact with the following medications:  atazanavir  cimetidine  ciprofloxacin  enoxacin  fluvoxamine  medicines for seizures like carbamazepine and phenobarbital  mexiletine  rifampin  tacrine  thiabendazole  zileuton This list may not describe all possible interactions. Give your health care provider a list of all the medicines, herbs, non-prescription drugs, or dietary supplements you use. Also tell them if  you smoke, drink alcohol, or use illegal drugs. Some items may interact with your medicine. What should I watch for while using this medicine? This drug may make you feel generally unwell. This is not uncommon, as chemotherapy can affect healthy cells as well as cancer cells. Report any side effects. Continue your course of treatment even though you feel ill unless your doctor tells you to stop. You may need blood work done while you are taking this medicine. Call your doctor or healthcare provider for advice if you get a fever, chills or sore throat, or other symptoms of a cold or flu. Do not treat yourself. This drug decreases your body's ability to fight infections. Try to avoid being around people who are sick. This medicine may cause serious skin reactions. They can happen weeks to months after starting the medicine. Contact your healthcare provider right away if you notice fevers or flu-like symptoms with a rash. The rash may be red or purple and then turn into blisters or peeling of the skin. Or, you might notice a red rash with swelling of the face, lips or lymph nodes in your neck or under your arms. This medicine may increase your risk to bruise or bleed. Call your doctor or healthcare provider if you notice any unusual bleeding. Talk to your doctor about your risk of cancer. You may be more at risk for certain types of cancers if you take this medicine. Do not become pregnant while taking this medicine or for at least 6 months after stopping it. Women should inform their doctor if they wish to become pregnant or think they might be pregnant. Men should not  father a child while taking this medicine and for at least 3 months after stopping it. There is a potential for serious side effects to an unborn child. Talk to your healthcare provider or pharmacist for more information. Do not breast-feed an infant while taking this medicine or for at least 1 week after stopping it. This medicine may make it  more difficult to father a child. You should talk with your doctor or healthcare provider if you are concerned about your fertility. What side effects may I notice from receiving this medicine? Side effects that you should report to your doctor or health care professional as soon as possible:  allergic reactions like skin rash, itching or hives, swelling of the face, lips, or tongue  low blood counts - this medicine may decrease the number of white blood cells, red blood cells and platelets. You may be at increased risk for infections and bleeding.  rash, fever, and swollen lymph nodes  redness, blistering, peeling, or loosening of the skin, including inside the mouth  signs of infection like fever or chills, cough, sore throat, pain or difficulty passing urine  signs of decreased platelets or bleeding like bruising, pinpoint red spots on the skin, black, tarry stools, blood in the urine  signs of decreased red blood cells like being unusually weak or tired, fainting spells, lightheadedness  signs and symptoms of kidney injury like trouble passing urine or change in the amount of urine  signs and symptoms of liver injury like dark yellow or brown urine; general ill feeling or flu-like symptoms; light-colored stools; loss of appetite; nausea; right upper belly pain; unusually weak or tired; yellowing of the eyes or skin Side effects that usually do not require medical attention (report to your doctor or health care professional if they continue or are bothersome):  constipation  decreased appetite  diarrhea  headache  mouth sores  nausea, vomiting  tiredness This list may not describe all possible side effects. Call your doctor for medical advice about side effects. You may report side effects to FDA at 1-800-FDA-1088. Where should I keep my medicine? This drug is given in a hospital or clinic and will not be stored at home. NOTE: This sheet is a summary. It may not cover all  possible information. If you have questions about this medicine, talk to your doctor, pharmacist, or health care provider.  2020 Elsevier/Gold Standard (2019-02-06 10:26:46)

## 2020-10-09 NOTE — Progress Notes (Signed)
Pt discharged in no apparent distress. Pt left ambulatory without assistance. Pt aware of discharge instructions and verbalized understanding and had no further questions.  

## 2020-10-17 ENCOUNTER — Other Ambulatory Visit: Payer: Self-pay | Admitting: Oncology

## 2020-10-17 ENCOUNTER — Encounter: Payer: Self-pay | Admitting: Oncology

## 2020-10-21 ENCOUNTER — Other Ambulatory Visit: Payer: PRIVATE HEALTH INSURANCE

## 2020-10-21 ENCOUNTER — Telehealth: Payer: Self-pay | Admitting: Nurse Practitioner

## 2020-10-21 ENCOUNTER — Other Ambulatory Visit: Payer: Self-pay

## 2020-10-21 ENCOUNTER — Inpatient Hospital Stay (HOSPITAL_BASED_OUTPATIENT_CLINIC_OR_DEPARTMENT_OTHER): Payer: MEDICARE | Admitting: Nurse Practitioner

## 2020-10-21 ENCOUNTER — Encounter: Payer: Self-pay | Admitting: Nurse Practitioner

## 2020-10-21 ENCOUNTER — Inpatient Hospital Stay: Payer: MEDICARE

## 2020-10-21 VITALS — BP 133/70 | HR 88 | Temp 97.6°F | Ht 71.0 in | Wt 188.6 lb

## 2020-10-21 DIAGNOSIS — C9110 Chronic lymphocytic leukemia of B-cell type not having achieved remission: Secondary | ICD-10-CM

## 2020-10-21 DIAGNOSIS — Z5111 Encounter for antineoplastic chemotherapy: Secondary | ICD-10-CM | POA: Diagnosis not present

## 2020-10-21 LAB — BASIC METABOLIC PANEL - CANCER CENTER ONLY
Anion gap: 9 (ref 5–15)
BUN: 29 mg/dL — ABNORMAL HIGH (ref 8–23)
CO2: 27 mmol/L (ref 22–32)
Calcium: 9.1 mg/dL (ref 8.9–10.3)
Chloride: 104 mmol/L (ref 98–111)
Creatinine: 1.4 mg/dL — ABNORMAL HIGH (ref 0.61–1.24)
GFR, Estimated: 52 mL/min — ABNORMAL LOW (ref >60)
Glucose, Bld: 89 mg/dL (ref 70–99)
Potassium: 4.4 mmol/L (ref 3.5–5.1)
Sodium: 140 mmol/L (ref 135–145)

## 2020-10-21 LAB — CBC WITH DIFFERENTIAL (CANCER CENTER ONLY)
Abs Immature Granulocytes: 0.01 10*3/uL (ref 0.00–0.07)
Basophils Absolute: 0 10*3/uL (ref 0.0–0.1)
Basophils Relative: 0 %
Eosinophils Absolute: 0.2 10*3/uL (ref 0.0–0.5)
Eosinophils Relative: 3 %
HCT: 30.4 % — ABNORMAL LOW (ref 39.0–52.0)
Hemoglobin: 9.8 g/dL — ABNORMAL LOW (ref 13.0–17.0)
Immature Granulocytes: 0 %
Lymphocytes Relative: 48 %
Lymphs Abs: 2.6 10*3/uL (ref 0.7–4.0)
MCH: 33.2 pg (ref 26.0–34.0)
MCHC: 32.2 g/dL (ref 30.0–36.0)
MCV: 103.1 fL — ABNORMAL HIGH (ref 80.0–100.0)
Monocytes Absolute: 0.2 10*3/uL (ref 0.1–1.0)
Monocytes Relative: 4 %
Neutro Abs: 2.4 10*3/uL (ref 1.7–7.7)
Neutrophils Relative %: 45 %
Platelet Count: 125 10*3/uL — ABNORMAL LOW (ref 150–400)
RBC: 2.95 MIL/uL — ABNORMAL LOW (ref 4.22–5.81)
RDW: 17 % — ABNORMAL HIGH (ref 11.5–15.5)
WBC Count: 5.3 10*3/uL (ref 4.0–10.5)
nRBC: 0 % (ref 0.0–0.2)

## 2020-10-21 LAB — URIC ACID: Uric Acid, Serum: 7.2 mg/dL (ref 3.7–8.6)

## 2020-10-21 LAB — PROTIME-INR
INR: 2 — ABNORMAL HIGH (ref 0.8–1.2)
Prothrombin Time: 22 seconds — ABNORMAL HIGH (ref 11.4–15.2)

## 2020-10-21 NOTE — Telephone Encounter (Signed)
Scheduled per los. Gave avs and calendar  

## 2020-10-21 NOTE — Progress Notes (Addendum)
  [place] OFFICE PROGRESS NOTE   Diagnosis: CLL, anticoagulation therapy  INTERVAL HISTORY:   [name] returns as scheduled.  He completed cycle 1 bendamustine/Rituxan beginning 10/08/2020.  He felt "woozy" on day 3.  He had no significant nausea/vomiting.  No mouth sores.  No diarrhea.  No signs of an allergic reaction.  Objective:  Vital signs in last 24 hours:  Blood pressure 133/70, pulse 88, temperature 97.6 F (36.4 C), temperature source Tympanic, height _0  (1.803 m), weight 188 lb 9.6 oz (85.5 kg), SpO2 100 %.    HEENT: No thrush or ulcers. Lymphatics: Approximate 1 cm left low neck/scalene lymph node. Resp: Lungs clear bilaterally. Cardio: Regular rate and rhythm. GI: No hepatomegaly.  Masslike fullness left mid abdomen. Vascular: Trace edema lower legs bilaterally right greater than left.    Lab Results:  Lab Results  Component Value Date   WBC 5.3 10/21/2020   HGB 9.8 (L) 10/21/2020   HCT 30.4 (L) 10/21/2020   MCV 103.1 (H) 10/21/2020   PLT 125 (L) 10/21/2020   NEUTROABS 2.4 10/21/2020    Imaging:  No results found.  Medications: I have reviewed the patient's current medications.  Assessment/Plan: 1. 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 He will return for a monthly PT check.  2. Indwelling IVC catheter.  3. 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  4. 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 erectile dysfunction-followed by Dr. Jeffie Pollock , status post placement of a penile implant at [facility] in April 2014  7. CT of the abdomen/pelvis at [facility] urology February 2014 for evaluation of hematuria-12 mm external iliac nodes and leftperiaortic retroperitoneal lymph node-11 mm-potentially related to CLL 8.CLL-flow cytometry of the peripheral blood 12/27/2017 monoclonal B-cell population  consistent with CLL, peripheral lymphocytosis and small palpable lymph nodes  CT abdomen/pelvis 09/18/2019-extensive abdominal pelvic lymphadenopathy including a left periaortic node measuring 5.7 cm  08/13/2020 CLL FISH panel-no evidence of trisomy 12; no evidence of [id number]; no evidence of p53 deletion or amplification; deletion of ATM present  Cycle 1 bendamustine/Rituxan 10/08/2020, 10/09/2020 9. Basal cell carcinoma removed from the left superior central forehead 01/21/2020    10.  Renal insufficiency  Disposition: [name] appears stable.  He has completed 1 cycle of bendamustine/Rituxan.  He seems to have tolerated well.  Peripheral adenopathy and abdominal mass have improved.  We reviewed the CBC from today.  White count is now in normal range, platelets are better.  He will return for follow-up and cycle 2 bendamustine/Rituxan on 11/05/2020.  He will contact the office in the interim with any problems.  Patient seen with Dr. Benay Spice.    [name] ANP/GNP-BC   10/21/2020  11:50 AM This was a shared visit with [name].  Mr. [name] was interviewed and examined.  He tolerated the first cycle of bendamustine/rituximab well.  He appears to be responding to this regimen.  He will return for an office visit prior to cycle 2.  Julieanne Manson, MD

## 2020-10-22 ENCOUNTER — Other Ambulatory Visit: Payer: Self-pay | Admitting: Oncology

## 2020-10-22 ENCOUNTER — Telehealth: Payer: Self-pay | Admitting: *Deleted

## 2020-10-22 NOTE — Telephone Encounter (Signed)
Notified of therapeutic INR of 2.0. Continue warfarin 10 mg qd, except 12.5 mg MWF> Will recheck on 11/05/20

## 2020-10-30 NOTE — Progress Notes (Signed)
Dr. Benay Spice ok with switching to rapid rituxan.

## 2020-11-02 ENCOUNTER — Other Ambulatory Visit: Payer: Self-pay | Admitting: Oncology

## 2020-11-04 ENCOUNTER — Other Ambulatory Visit: Payer: Self-pay | Admitting: Internal Medicine

## 2020-11-05 ENCOUNTER — Inpatient Hospital Stay: Payer: MEDICARE | Attending: Oncology

## 2020-11-05 ENCOUNTER — Inpatient Hospital Stay: Payer: MEDICARE

## 2020-11-05 ENCOUNTER — Inpatient Hospital Stay (HOSPITAL_BASED_OUTPATIENT_CLINIC_OR_DEPARTMENT_OTHER): Payer: MEDICARE | Admitting: Nurse Practitioner

## 2020-11-05 ENCOUNTER — Other Ambulatory Visit: Payer: Self-pay | Admitting: *Deleted

## 2020-11-05 ENCOUNTER — Other Ambulatory Visit: Payer: Self-pay

## 2020-11-05 ENCOUNTER — Encounter: Payer: Self-pay | Admitting: Nurse Practitioner

## 2020-11-05 VITALS — BP 131/75 | HR 88 | Temp 97.8°F | Resp 18 | Ht 71.0 in | Wt 191.6 lb

## 2020-11-05 VITALS — BP 110/59 | HR 68 | Temp 98.7°F | Resp 18

## 2020-11-05 DIAGNOSIS — Z79899 Other long term (current) drug therapy: Secondary | ICD-10-CM | POA: Insufficient documentation

## 2020-11-05 DIAGNOSIS — C9110 Chronic lymphocytic leukemia of B-cell type not having achieved remission: Secondary | ICD-10-CM | POA: Insufficient documentation

## 2020-11-05 DIAGNOSIS — Z7901 Long term (current) use of anticoagulants: Secondary | ICD-10-CM

## 2020-11-05 DIAGNOSIS — Z5111 Encounter for antineoplastic chemotherapy: Secondary | ICD-10-CM | POA: Diagnosis not present

## 2020-11-05 DIAGNOSIS — I82409 Acute embolism and thrombosis of unspecified deep veins of unspecified lower extremity: Secondary | ICD-10-CM | POA: Diagnosis not present

## 2020-11-05 DIAGNOSIS — D696 Thrombocytopenia, unspecified: Secondary | ICD-10-CM | POA: Diagnosis not present

## 2020-11-05 LAB — CMP (CANCER CENTER ONLY)
ALT: <6 U/L (ref 0–44)
AST: 17 U/L (ref 15–41)
Albumin: 3.5 g/dL (ref 3.5–5.0)
Alkaline Phosphatase: 47 U/L (ref 38–126)
Anion gap: 6 (ref 5–15)
BUN: 21 mg/dL (ref 8–23)
CO2: 26 mmol/L (ref 22–32)
Calcium: 8.9 mg/dL (ref 8.9–10.3)
Chloride: 108 mmol/L (ref 98–111)
Creatinine: 1.41 mg/dL — ABNORMAL HIGH (ref 0.61–1.24)
GFR, Estimated: 52 mL/min — ABNORMAL LOW (ref >60)
Glucose, Bld: 113 mg/dL — ABNORMAL HIGH (ref 70–99)
Potassium: 3.8 mmol/L (ref 3.5–5.1)
Sodium: 140 mmol/L (ref 135–145)
Total Bilirubin: 0.4 mg/dL (ref 0.3–1.2)
Total Protein: 6.1 g/dL — ABNORMAL LOW (ref 6.5–8.1)

## 2020-11-05 LAB — CBC WITH DIFFERENTIAL (CANCER CENTER ONLY)
Abs Immature Granulocytes: 0.04 10*3/uL (ref 0.00–0.07)
Basophils Absolute: 0 10*3/uL (ref 0.0–0.1)
Basophils Relative: 1 %
Eosinophils Absolute: 0.2 10*3/uL (ref 0.0–0.5)
Eosinophils Relative: 6 %
HCT: 30.7 % — ABNORMAL LOW (ref 39.0–52.0)
Hemoglobin: 10 g/dL — ABNORMAL LOW (ref 13.0–17.0)
Immature Granulocytes: 1 %
Lymphocytes Relative: 18 %
Lymphs Abs: 0.6 10*3/uL — ABNORMAL LOW (ref 0.7–4.0)
MCH: 33.2 pg (ref 26.0–34.0)
MCHC: 32.6 g/dL (ref 30.0–36.0)
MCV: 102 fL — ABNORMAL HIGH (ref 80.0–100.0)
Monocytes Absolute: 0.2 10*3/uL (ref 0.1–1.0)
Monocytes Relative: 5 %
Neutro Abs: 2.2 10*3/uL (ref 1.7–7.7)
Neutrophils Relative %: 69 %
Platelet Count: 118 10*3/uL — ABNORMAL LOW (ref 150–400)
RBC: 3.01 MIL/uL — ABNORMAL LOW (ref 4.22–5.81)
RDW: 16.6 % — ABNORMAL HIGH (ref 11.5–15.5)
WBC Count: 3.2 10*3/uL — ABNORMAL LOW (ref 4.0–10.5)
nRBC: 0 % (ref 0.0–0.2)

## 2020-11-05 LAB — LACTATE DEHYDROGENASE: LDH: 281 U/L — ABNORMAL HIGH (ref 98–192)

## 2020-11-05 LAB — PROTIME-INR
INR: 2.4 — ABNORMAL HIGH (ref 0.8–1.2)
Prothrombin Time: 25.1 seconds — ABNORMAL HIGH (ref 11.4–15.2)

## 2020-11-05 LAB — URIC ACID: Uric Acid, Serum: 6.3 mg/dL (ref 3.7–8.6)

## 2020-11-05 MED ORDER — DIPHENHYDRAMINE HCL 25 MG PO CAPS
ORAL_CAPSULE | ORAL | Status: AC
  Filled 2020-11-05: qty 2

## 2020-11-05 MED ORDER — PALONOSETRON HCL INJECTION 0.25 MG/5ML
0.2500 mg | Freq: Once | INTRAVENOUS | Status: AC
  Administered 2020-11-05: 0.25 mg via INTRAVENOUS

## 2020-11-05 MED ORDER — SODIUM CHLORIDE 0.9 % IV SOLN
Freq: Once | INTRAVENOUS | Status: AC
  Filled 2020-11-05: qty 250

## 2020-11-05 MED ORDER — ACETAMINOPHEN 325 MG PO TABS
650.0000 mg | ORAL_TABLET | Freq: Once | ORAL | Status: AC
  Administered 2020-11-05: 650 mg via ORAL

## 2020-11-05 MED ORDER — PALONOSETRON HCL INJECTION 0.25 MG/5ML
INTRAVENOUS | Status: AC
  Filled 2020-11-05: qty 5

## 2020-11-05 MED ORDER — ACETAMINOPHEN 325 MG PO TABS
ORAL_TABLET | ORAL | Status: AC
  Filled 2020-11-05: qty 2

## 2020-11-05 MED ORDER — SODIUM CHLORIDE 0.9 % IV SOLN
10.0000 mg | Freq: Once | INTRAVENOUS | Status: AC
  Administered 2020-11-05: 10 mg via INTRAVENOUS
  Filled 2020-11-05: qty 10

## 2020-11-05 MED ORDER — SODIUM CHLORIDE 0.9 % IV SOLN
375.0000 mg/m2 | Freq: Once | INTRAVENOUS | Status: AC
  Administered 2020-11-05: 800 mg via INTRAVENOUS
  Filled 2020-11-05: qty 50

## 2020-11-05 MED ORDER — DIPHENHYDRAMINE HCL 25 MG PO CAPS
50.0000 mg | ORAL_CAPSULE | Freq: Once | ORAL | Status: AC
  Administered 2020-11-05: 50 mg via ORAL

## 2020-11-05 MED ORDER — SODIUM CHLORIDE 0.9 % IV SOLN
70.0000 mg/m2 | Freq: Once | INTRAVENOUS | Status: AC
  Administered 2020-11-05: 150 mg via INTRAVENOUS
  Filled 2020-11-05: qty 6

## 2020-11-05 NOTE — Patient Instructions (Signed)
[place] Discharge Instructions for Patients Receiving Chemotherapy  Today you received the following chemotherapy agents Rituxin; Bendeka  To help prevent nausea and vomiting after your treatment, we encourage you to take your nausea medication  As directed   If you develop nausea and vomiting that is not controlled by your nausea medication, call the clinic.   BELOW ARE SYMPTOMS THAT SHOULD BE REPORTED IMMEDIATELY:  *FEVER GREATER THAN 100.5 F  *CHILLS WITH OR WITHOUT FEVER  NAUSEA AND VOMITING THAT IS NOT CONTROLLED WITH YOUR NAUSEA MEDICATION  *UNUSUAL SHORTNESS OF BREATH  *UNUSUAL BRUISING OR BLEEDING  TENDERNESS IN MOUTH AND THROAT WITH OR WITHOUT PRESENCE OF ULCERS  *URINARY PROBLEMS  *BOWEL PROBLEMS  UNUSUAL RASH Items with * indicate a potential emergency and should be followed up as soon as possible.  Feel free to call the clinic should you have any questions or concerns. The clinic phone number is [phone].  Please show the [facility] at check-in to the Emergency Department and triage nurse.

## 2020-11-05 NOTE — Progress Notes (Signed)
Patient discharged in stable condition.

## 2020-11-05 NOTE — Progress Notes (Addendum)
[facility] OFFICE PROGRESS NOTE   Diagnosis: CLL, anticoagulation therapy  INTERVAL HISTORY:   Chad Gross returns as scheduled.  He completed cycle 1 bendamustine/Rituxan beginning 10/08/2020.  Overall he is feeling well.  No fevers or sweats.  He has a good appetite.  No abdominal pain.  No nausea or vomiting.  No constipation or diarrhea.  No cough or shortness of breath.  He complains of a "sore spot" at the upper gluteal fold.  He reports a cyst in this area 6 to 8 months ago which ruptured.  The discomfort is improving.  Objective:  Vital signs in last 24 hours:  Blood pressure 131/75, pulse 88, temperature 97.8 F (36.6 C), temperature source Tympanic, resp. rate 18, height '5\' 11"'  (1.803 m), weight 191 lb 9.6 oz (86.9 kg), SpO2 99 %.    HEENT: No thrush or ulcers. Lymphatics: Approximate half centimeter left low neck/scalene lymph node. Resp: Lungs clear bilaterally. Cardio: Regular rate and rhythm. GI: No hepatomegaly.  Masslike fullness left mid to upper abdomen. Vascular: Trace edema lower leg bilaterally right greater than left.  Skin: Small opening in the skin upper gluteal fold, mild tenderness at the site of the opening, nontender over the coccyx, no drainage.   Lab Results:  Lab Results  Component Value Date   WBC 3.2 (L) 11/05/2020   HGB 10.0 (L) 11/05/2020   HCT 30.7 (L) 11/05/2020   MCV 102.0 (H) 11/05/2020   PLT 118 (L) 11/05/2020   NEUTROABS 2.2 11/05/2020    Imaging:  No results found.  Medications: I have reviewed the patient's current medications.  Assessment/Plan: 1. 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 He will return for a monthly PT check.  2. Indwelling IVC catheter.  3. 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  4. 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 erectile dysfunction-followed by Dr.  Jeffie Pollock , status post placement of a penile implant at [facility] in April 2014  7. CT of the abdomen/pelvis at [facility] urology February 2014 for evaluation of hematuria-12 mm external iliac nodes and leftperiaortic retroperitoneal lymph node-11 mm-potentially related to CLL 8.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, peripheral lymphocytosis and small palpable lymph nodes  CT abdomen/pelvis 09/18/2019-extensive abdominal pelvic lymphadenopathy including a left periaortic node measuring 5.7 cm  08/13/2020 CLL FISH panel-no evidence of trisomy 12; no evidence of [id number]; no evidence of p53 deletion or amplification; deletion of ATM present  Cycle 1 bendamustine/Rituxan 10/08/2020, 10/09/2020  Cycle 2 bendamustine/Rituxan 11/05/2020, 11/06/2020 9. Basal cell carcinoma removed from the left superior central forehead 01/21/2020 10.Renal insufficiency  Disposition: Chad Gross appears stable.  He has completed 1 cycle of bendamustine/Rituxan.  The white count has declined significantly and the platelet count remains improved, peripheral adenopathy is better.  He seems to be responding to the treatment.  Plan to proceed with cycle 2 today as scheduled.  We reviewed the CBC from today.  Counts adequate to proceed with treatment.  He will return for a repeat CBC on 11/17/2020.  He likely has a resolving cyst at the upper gluteal fold.  He will keep the area clean and dry.  He understands to contact the office with increased pain.  He will return for lab, follow-up, cycle 3 bendamustine/Rituxan on 12/04/2019.  He will contact the office in the interim as outlined above or with any other problems.  Patient seen with Dr.  Sherrill.   [name] ANP/GNP-BC   11/05/2020  8:15 AM This was a shared visit with [name].  Chad Gross was interviewed and examined.  He tolerated the first cycle of  bendamustine/rituximab well.  The lymphocytosis has resolved and the palpable lymphadenopathy is improved.  He will complete cycle 2 today.  We are submitting peripheral blood for immunoglobulin heavy chain and p53 mutation testing today.  The PT/INR is therapeutic.  He will continue Coumadin.  He appears to have a resolving cyst at the upper gluteal fold.  Julieanne Manson, MD

## 2020-11-06 ENCOUNTER — Inpatient Hospital Stay: Payer: MEDICARE

## 2020-11-06 ENCOUNTER — Telehealth: Payer: Self-pay | Admitting: Nurse Practitioner

## 2020-11-06 VITALS — BP 126/51 | HR 51 | Temp 99.4°F | Resp 18

## 2020-11-06 DIAGNOSIS — C9110 Chronic lymphocytic leukemia of B-cell type not having achieved remission: Secondary | ICD-10-CM

## 2020-11-06 DIAGNOSIS — Z5111 Encounter for antineoplastic chemotherapy: Secondary | ICD-10-CM | POA: Diagnosis not present

## 2020-11-06 MED ORDER — SODIUM CHLORIDE 0.9 % IV SOLN
70.0000 mg/m2 | Freq: Once | INTRAVENOUS | Status: AC
  Administered 2020-11-06: 150 mg via INTRAVENOUS
  Filled 2020-11-06: qty 6

## 2020-11-06 MED ORDER — SODIUM CHLORIDE 0.9 % IV SOLN
10.0000 mg | Freq: Once | INTRAVENOUS | Status: AC
  Administered 2020-11-06: 10 mg via INTRAVENOUS
  Filled 2020-11-06: qty 10

## 2020-11-06 MED ORDER — SODIUM CHLORIDE 0.9 % IV SOLN
Freq: Once | INTRAVENOUS | Status: AC
  Filled 2020-11-06: qty 250

## 2020-11-06 NOTE — Progress Notes (Signed)
Pt ambulatory w/steady gait, stable at time of d/c, tolerated tx well.

## 2020-11-06 NOTE — Telephone Encounter (Signed)
Scheduled appointment per 12/8 los. Will have updated calendar printed for patient at next visit.

## 2020-11-06 NOTE — Patient Instructions (Signed)
[place] Discharge Instructions for Patients Receiving Chemotherapy  Today you received the following chemotherapy agent: Bendeka  To help prevent nausea and vomiting after your treatment, we encourage you to take your nausea medication  As directed   If you develop nausea and vomiting that is not controlled by your nausea medication, call the clinic.   BELOW ARE SYMPTOMS THAT SHOULD BE REPORTED IMMEDIATELY:  *FEVER GREATER THAN 100.5 F  *CHILLS WITH OR WITHOUT FEVER  NAUSEA AND VOMITING THAT IS NOT CONTROLLED WITH YOUR NAUSEA MEDICATION  *UNUSUAL SHORTNESS OF BREATH  *UNUSUAL BRUISING OR BLEEDING  TENDERNESS IN MOUTH AND THROAT WITH OR WITHOUT PRESENCE OF ULCERS  *URINARY PROBLEMS  *BOWEL PROBLEMS  UNUSUAL RASH Items with * indicate a potential emergency and should be followed up as soon as possible.  Feel free to call the clinic should you have any questions or concerns. The clinic phone number is [phone].  Please show the [place] at check-in to the Emergency Department and triage nurse.

## 2020-11-13 ENCOUNTER — Encounter: Payer: Self-pay | Admitting: Oncology

## 2020-11-17 ENCOUNTER — Inpatient Hospital Stay: Payer: MEDICARE

## 2020-11-17 ENCOUNTER — Other Ambulatory Visit: Payer: Self-pay

## 2020-11-17 DIAGNOSIS — Z7901 Long term (current) use of anticoagulants: Secondary | ICD-10-CM

## 2020-11-17 DIAGNOSIS — C9110 Chronic lymphocytic leukemia of B-cell type not having achieved remission: Secondary | ICD-10-CM

## 2020-11-17 DIAGNOSIS — Z5111 Encounter for antineoplastic chemotherapy: Secondary | ICD-10-CM | POA: Diagnosis not present

## 2020-11-17 DIAGNOSIS — I82409 Acute embolism and thrombosis of unspecified deep veins of unspecified lower extremity: Secondary | ICD-10-CM

## 2020-11-17 LAB — CBC WITH DIFFERENTIAL (CANCER CENTER ONLY)
Abs Immature Granulocytes: 0.02 10*3/uL (ref 0.00–0.07)
Basophils Absolute: 0 10*3/uL (ref 0.0–0.1)
Basophils Relative: 1 %
Eosinophils Absolute: 0.2 10*3/uL (ref 0.0–0.5)
Eosinophils Relative: 6 %
HCT: 32.8 % — ABNORMAL LOW (ref 39.0–52.0)
Hemoglobin: 11.1 g/dL — ABNORMAL LOW (ref 13.0–17.0)
Immature Granulocytes: 1 %
Lymphocytes Relative: 15 %
Lymphs Abs: 0.5 10*3/uL — ABNORMAL LOW (ref 0.7–4.0)
MCH: 33.8 pg (ref 26.0–34.0)
MCHC: 33.8 g/dL (ref 30.0–36.0)
MCV: 100 fL (ref 80.0–100.0)
Monocytes Absolute: 0.2 10*3/uL (ref 0.1–1.0)
Monocytes Relative: 7 %
Neutro Abs: 2.4 10*3/uL (ref 1.7–7.7)
Neutrophils Relative %: 70 %
Platelet Count: 143 10*3/uL — ABNORMAL LOW (ref 150–400)
RBC: 3.28 MIL/uL — ABNORMAL LOW (ref 4.22–5.81)
RDW: 16 % — ABNORMAL HIGH (ref 11.5–15.5)
WBC Count: 3.4 10*3/uL — ABNORMAL LOW (ref 4.0–10.5)
nRBC: 0 % (ref 0.0–0.2)

## 2020-11-17 LAB — PROTIME-INR
INR: 2.2 — ABNORMAL HIGH (ref 0.8–1.2)
Prothrombin Time: 23.9 seconds — ABNORMAL HIGH (ref 11.4–15.2)

## 2020-11-20 ENCOUNTER — Other Ambulatory Visit: Payer: Self-pay | Admitting: *Deleted

## 2020-11-20 DIAGNOSIS — C9110 Chronic lymphocytic leukemia of B-cell type not having achieved remission: Secondary | ICD-10-CM

## 2020-11-20 NOTE — Progress Notes (Signed)
Dr. Benay Spice confirms that the IgVH Somatic Hypermutation test is what is needed at next lab draw. P53 mutation test is still pending.

## 2020-11-24 LAB — MISC LABCORP TEST (SEND OUT): Labcorp test code: 489590

## 2020-11-28 ENCOUNTER — Other Ambulatory Visit: Payer: Self-pay | Admitting: Oncology

## 2020-12-01 ENCOUNTER — Other Ambulatory Visit: Payer: Self-pay | Admitting: Oncology

## 2020-12-01 DIAGNOSIS — Z7901 Long term (current) use of anticoagulants: Secondary | ICD-10-CM

## 2020-12-02 ENCOUNTER — Other Ambulatory Visit: Payer: Self-pay | Admitting: Internal Medicine

## 2020-12-02 MED ORDER — HYDROCORTISONE (PERIANAL) 2.5 % EX CREA: 1.0000 "application " | TOPICAL_CREAM | Freq: Two times a day (BID) | CUTANEOUS | 0 refills | Status: DC

## 2020-12-03 ENCOUNTER — Other Ambulatory Visit: Payer: Self-pay

## 2020-12-03 ENCOUNTER — Inpatient Hospital Stay: Payer: Medicare Other

## 2020-12-03 ENCOUNTER — Inpatient Hospital Stay (HOSPITAL_BASED_OUTPATIENT_CLINIC_OR_DEPARTMENT_OTHER): Payer: Medicare Other | Admitting: Oncology

## 2020-12-03 ENCOUNTER — Ambulatory Visit: Payer: PRIVATE HEALTH INSURANCE | Admitting: Nurse Practitioner

## 2020-12-03 ENCOUNTER — Other Ambulatory Visit: Payer: PRIVATE HEALTH INSURANCE

## 2020-12-03 ENCOUNTER — Inpatient Hospital Stay: Payer: Medicare Other | Attending: Oncology

## 2020-12-03 VITALS — BP 122/62 | HR 72 | Temp 97.2°F | Resp 16

## 2020-12-03 VITALS — BP 137/76 | HR 78 | Temp 97.2°F | Resp 16 | Ht 71.0 in | Wt 189.9 lb

## 2020-12-03 DIAGNOSIS — D696 Thrombocytopenia, unspecified: Secondary | ICD-10-CM | POA: Insufficient documentation

## 2020-12-03 DIAGNOSIS — C9110 Chronic lymphocytic leukemia of B-cell type not having achieved remission: Secondary | ICD-10-CM | POA: Insufficient documentation

## 2020-12-03 DIAGNOSIS — Z5111 Encounter for antineoplastic chemotherapy: Secondary | ICD-10-CM | POA: Insufficient documentation

## 2020-12-03 DIAGNOSIS — N529 Male erectile dysfunction, unspecified: Secondary | ICD-10-CM | POA: Insufficient documentation

## 2020-12-03 DIAGNOSIS — Z79899 Other long term (current) drug therapy: Secondary | ICD-10-CM | POA: Insufficient documentation

## 2020-12-03 LAB — PROTIME-INR
INR: 2.6 — ABNORMAL HIGH (ref 0.8–1.2)
Prothrombin Time: 26.9 seconds — ABNORMAL HIGH (ref 11.4–15.2)

## 2020-12-03 LAB — CMP (CANCER CENTER ONLY)
ALT: 11 U/L (ref 0–44)
AST: 16 U/L (ref 15–41)
Albumin: 3.7 g/dL (ref 3.5–5.0)
Alkaline Phosphatase: 42 U/L (ref 38–126)
Anion gap: 10 (ref 5–15)
BUN: 21 mg/dL (ref 8–23)
CO2: 25 mmol/L (ref 22–32)
Calcium: 8.9 mg/dL (ref 8.9–10.3)
Chloride: 105 mmol/L (ref 98–111)
Creatinine: 1.39 mg/dL — ABNORMAL HIGH (ref 0.61–1.24)
GFR, Estimated: 53 mL/min — ABNORMAL LOW (ref >60)
Glucose, Bld: 92 mg/dL (ref 70–99)
Potassium: 3.9 mmol/L (ref 3.5–5.1)
Sodium: 140 mmol/L (ref 135–145)
Total Bilirubin: 0.6 mg/dL (ref 0.3–1.2)
Total Protein: 6.4 g/dL — ABNORMAL LOW (ref 6.5–8.1)

## 2020-12-03 LAB — CBC WITH DIFFERENTIAL (CANCER CENTER ONLY)
Abs Immature Granulocytes: 0.02 10*3/uL (ref 0.00–0.07)
Basophils Absolute: 0.1 10*3/uL (ref 0.0–0.1)
Basophils Relative: 1 %
Eosinophils Absolute: 0.3 10*3/uL (ref 0.0–0.5)
Eosinophils Relative: 7 %
HCT: 38.4 % — ABNORMAL LOW (ref 39.0–52.0)
Hemoglobin: 12.5 g/dL — ABNORMAL LOW (ref 13.0–17.0)
Immature Granulocytes: 1 %
Lymphocytes Relative: 10 %
Lymphs Abs: 0.4 10*3/uL — ABNORMAL LOW (ref 0.7–4.0)
MCH: 32.9 pg (ref 26.0–34.0)
MCHC: 32.6 g/dL (ref 30.0–36.0)
MCV: 101.1 fL — ABNORMAL HIGH (ref 80.0–100.0)
Monocytes Absolute: 0.4 10*3/uL (ref 0.1–1.0)
Monocytes Relative: 10 %
Neutro Abs: 3 10*3/uL (ref 1.7–7.7)
Neutrophils Relative %: 71 %
Platelet Count: 100 10*3/uL — ABNORMAL LOW (ref 150–400)
RBC: 3.8 MIL/uL — ABNORMAL LOW (ref 4.22–5.81)
RDW: 14.5 % (ref 11.5–15.5)
WBC Count: 4.2 10*3/uL (ref 4.0–10.5)
nRBC: 0 % (ref 0.0–0.2)

## 2020-12-03 MED ORDER — DIPHENHYDRAMINE HCL 25 MG PO CAPS
50.0000 mg | ORAL_CAPSULE | Freq: Once | ORAL | Status: AC
  Administered 2020-12-03: 50 mg via ORAL

## 2020-12-03 MED ORDER — SODIUM CHLORIDE 0.9 % IV SOLN
10.0000 mg | Freq: Once | INTRAVENOUS | Status: AC
  Administered 2020-12-03: 10 mg via INTRAVENOUS
  Filled 2020-12-03: qty 10

## 2020-12-03 MED ORDER — SODIUM CHLORIDE 0.9 % IV SOLN
375.0000 mg/m2 | Freq: Once | INTRAVENOUS | Status: AC
  Administered 2020-12-03: 800 mg via INTRAVENOUS
  Filled 2020-12-03: qty 50

## 2020-12-03 MED ORDER — PALONOSETRON HCL INJECTION 0.25 MG/5ML
INTRAVENOUS | Status: AC
  Filled 2020-12-03: qty 5

## 2020-12-03 MED ORDER — ACETAMINOPHEN 325 MG PO TABS
650.0000 mg | ORAL_TABLET | Freq: Once | ORAL | Status: AC
  Administered 2020-12-03: 650 mg via ORAL

## 2020-12-03 MED ORDER — SODIUM CHLORIDE 0.9 % IV SOLN
Freq: Once | INTRAVENOUS | Status: AC
  Filled 2020-12-03: qty 250

## 2020-12-03 MED ORDER — DIPHENHYDRAMINE HCL 25 MG PO CAPS
ORAL_CAPSULE | ORAL | Status: AC
  Filled 2020-12-03: qty 2

## 2020-12-03 MED ORDER — ACETAMINOPHEN 325 MG PO TABS
ORAL_TABLET | ORAL | Status: AC
  Filled 2020-12-03: qty 2

## 2020-12-03 MED ORDER — PALONOSETRON HCL INJECTION 0.25 MG/5ML
0.2500 mg | Freq: Once | INTRAVENOUS | Status: AC
  Administered 2020-12-03: 0.25 mg via INTRAVENOUS

## 2020-12-03 MED ORDER — SODIUM CHLORIDE 0.9 % IV SOLN
70.0000 mg/m2 | Freq: Once | INTRAVENOUS | Status: AC
  Administered 2020-12-03: 150 mg via INTRAVENOUS
  Filled 2020-12-03: qty 6

## 2020-12-03 NOTE — Progress Notes (Addendum)
[facility] OFFICE PROGRESS NOTE   Diagnosis: CLL  INTERVAL HISTORY:   [name] complete another cycle of bendamustine/rituximab beginning on 11/05/2020.  No nausea/vomiting, mouth sores, rash, or signs of an allergic reaction.  He feels well.  Good appetite and energy level.  No fever or night sweats.  No palpable lymph nodes.  Objective:  Vital signs in last 24 hours:  Blood pressure 137/76, pulse 78, temperature (!) 97.2 F (36.2 C), temperature source Tympanic, resp. rate 16, height '5\' 11"'  (1.803 m), weight 189 lb 14.4 oz (86.1 kg), SpO2 99 %.    HEENT: No thrush or ulcers Lymphatics: No cervical, supraclavicular, or inguinal nodes Resp: Lungs clear bilaterally Cardio: Regular rate and rhythm GI: No hepatosplenomegaly, masslike fullness on deep palpation in the mid upper abdomen to the left of midline Vascular: No leg edema  Skin: 1-2 cm superficial ulceration with a central opening at the upper gluteal fold  Lab Results:  Lab Results  Component Value Date   WBC 4.2 12/03/2020   HGB 12.5 (L) 12/03/2020   HCT 38.4 (L) 12/03/2020   MCV 101.1 (H) 12/03/2020   PLT 100 (L) 12/03/2020   NEUTROABS 3.0 12/03/2020    CMP  Lab Results  Component Value Date   NA 140 12/03/2020   K 3.9 12/03/2020   CL 105 12/03/2020   CO2 25 12/03/2020   GLUCOSE 92 12/03/2020   BUN 21 12/03/2020   CREATININE 1.39 (H) 12/03/2020   CALCIUM 8.9 12/03/2020   PROT 6.4 (L) 12/03/2020   ALBUMIN 3.7 12/03/2020   AST 16 12/03/2020   ALT 11 12/03/2020   ALKPHOS 42 12/03/2020   BILITOT 0.6 12/03/2020   GFRNONAA 53 (L) 12/03/2020   GFRAA 52 (L) 08/13/2020      Lab Results  Component Value Date   INR 2.6 (H) 12/03/2020     Medications: I have reviewed the patient's current medications.   Assessment/Plan: 1. 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 He will return for a monthly PT check.  2. Indwelling IVC  catheter.  3. 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  4. 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 erectile dysfunction-followed by Dr. Jeffie Pollock , status post placement of a penile implant at [facility] in April 2014  7. CT of the abdomen/pelvis at [facility] urology February 2014 for evaluation of hematuria-12 mm external iliac nodes and leftperiaortic retroperitoneal lymph node-11 mm-potentially related to CLL 8.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, peripheral lymphocytosis and small palpable lymph nodes  CT abdomen/pelvis 09/18/2019-extensive abdominal pelvic lymphadenopathy including a left periaortic node measuring 5.7 cm  08/13/2020 CLL FISH panel-no evidence of trisomy 12; no evidence of [id number]; no evidence of p53 deletion or amplification; deletion of ATM present  11/05/2020-peripheral blood TP53 mutation-negative  Cycle 1 bendamustine/Rituxan 10/08/2020, 10/09/2020  Cycle 2 bendamustine/Rituxan 11/05/2020, 11/06/2020  Cycle 3 bendamustine/Rituxan 12/03/2020, 12/04/2020 9. Basal cell carcinoma removed from the left superior central forehead 01/21/2020 10.Renal insufficiency    Disposition: [name] has completed 2 cycles of bendamustine/rituximab.  He has tolerated the chemotherapy well.  The palpable lymphadenopathy is improved.  The lymphocytosis has resolved and the anemia has improved.  He will complete cycle 3 bendamustine/rituximab beginning today.  He has an appointment with dermatology to evaluate the gluteal lesion, likely a pressure ulcer or cyst.  The PT/INR is therapeutic.  He will continue Coumadin at  current dose.  Betsy Coder, MD  12/03/2020  12:11 PM

## 2020-12-03 NOTE — Progress Notes (Signed)
Per Dr. Truett Perna: OK to treat w/creatinine 1.39

## 2020-12-03 NOTE — Patient Instructions (Signed)
[facility] Discharge Instructions for Patients Receiving Chemotherapy  Today you received the following chemotherapy agents Rituxin; Bendeka  To help prevent nausea and vomiting after your treatment, we encourage you to take your nausea medication  As directed   If you develop nausea and vomiting that is not controlled by your nausea medication, call the clinic.   BELOW ARE SYMPTOMS THAT SHOULD BE REPORTED IMMEDIATELY:  *FEVER GREATER THAN 100.5 F  *CHILLS WITH OR WITHOUT FEVER  NAUSEA AND VOMITING THAT IS NOT CONTROLLED WITH YOUR NAUSEA MEDICATION  *UNUSUAL SHORTNESS OF BREATH  *UNUSUAL BRUISING OR BLEEDING  TENDERNESS IN MOUTH AND THROAT WITH OR WITHOUT PRESENCE OF ULCERS  *URINARY PROBLEMS  *BOWEL PROBLEMS  UNUSUAL RASH Items with * indicate a potential emergency and should be followed up as soon as possible.  Feel free to call the clinic should you have any questions or concerns. The clinic phone number is [phone].  Please show the CHEMO ALERT CARD at check-in to the Emergency Department and triage nurse.   

## 2020-12-04 ENCOUNTER — Telehealth: Payer: Self-pay | Admitting: Oncology

## 2020-12-04 ENCOUNTER — Other Ambulatory Visit: Payer: Self-pay

## 2020-12-04 ENCOUNTER — Inpatient Hospital Stay: Payer: Medicare Other

## 2020-12-04 VITALS — BP 134/67 | HR 90 | Temp 98.5°F | Resp 16

## 2020-12-04 DIAGNOSIS — Z5111 Encounter for antineoplastic chemotherapy: Secondary | ICD-10-CM | POA: Diagnosis not present

## 2020-12-04 DIAGNOSIS — C9110 Chronic lymphocytic leukemia of B-cell type not having achieved remission: Secondary | ICD-10-CM

## 2020-12-04 MED ORDER — PROCHLORPERAZINE MALEATE 5 MG PO TABS: 5.0000 mg | ORAL_TABLET | Freq: Four times a day (QID) | ORAL | 1 refills | Status: DC | PRN

## 2020-12-04 MED ORDER — SODIUM CHLORIDE 0.9 % IV SOLN
70.0000 mg/m2 | Freq: Once | INTRAVENOUS | Status: AC
  Administered 2020-12-04: 150 mg via INTRAVENOUS
  Filled 2020-12-04: qty 6

## 2020-12-04 MED ORDER — SODIUM CHLORIDE 0.9 % IV SOLN
Freq: Once | INTRAVENOUS | Status: AC
  Filled 2020-12-04: qty 250

## 2020-12-04 MED ORDER — SODIUM CHLORIDE 0.9 % IV SOLN
10.0000 mg | Freq: Once | INTRAVENOUS | Status: AC
  Administered 2020-12-04: 10 mg via INTRAVENOUS
  Filled 2020-12-04: qty 10

## 2020-12-04 NOTE — Telephone Encounter (Signed)
Scheduled appointments per 1/5 los. Updated calendar printed for patient per RN Loren.

## 2020-12-04 NOTE — Patient Instructions (Signed)
[facility] Discharge Instructions for Patients Receiving Chemotherapy  Today you received the following chemotherapy agents: bendamustine.  To help prevent nausea and vomiting after your treatment, we encourage you to take your nausea medication as directed.   If you develop nausea and vomiting that is not controlled by your nausea medication, call the clinic.   BELOW ARE SYMPTOMS THAT SHOULD BE REPORTED IMMEDIATELY:  *FEVER GREATER THAN 100.5 F  *CHILLS WITH OR WITHOUT FEVER  NAUSEA AND VOMITING THAT IS NOT CONTROLLED WITH YOUR NAUSEA MEDICATION  *UNUSUAL SHORTNESS OF BREATH  *UNUSUAL BRUISING OR BLEEDING  TENDERNESS IN MOUTH AND THROAT WITH OR WITHOUT PRESENCE OF ULCERS  *URINARY PROBLEMS  *BOWEL PROBLEMS  UNUSUAL RASH Items with * indicate a potential emergency and should be followed up as soon as possible.  Feel free to call the clinic should you have any questions or concerns. The clinic phone number is [phone].  Please show the CHEMO ALERT CARD at check-in to the Emergency Department and triage nurse.   

## 2020-12-10 LAB — IGVH SOMATIC HYPERMUTATION

## 2020-12-11 ENCOUNTER — Encounter: Payer: Self-pay | Admitting: Oncology

## 2020-12-12 ENCOUNTER — Telehealth: Payer: Self-pay | Admitting: *Deleted

## 2020-12-12 NOTE — Telephone Encounter (Signed)
Called patient to go over specifics of the above knee discomfort. He reports taking Tramadol bid when it began and then 1/day and it is getting better, so only taking Tylenol now. Denies any redness or swelling. Seems to get better with ambulation. Per Dr. Benay Spice: No connection between change in dental issues and his current treatment and continue to monitor issue of pain above knee--call if it begins to get red or swell.

## 2020-12-25 ENCOUNTER — Other Ambulatory Visit: Payer: Self-pay | Admitting: Oncology

## 2020-12-30 ENCOUNTER — Encounter: Payer: Self-pay | Admitting: Oncology

## 2020-12-31 ENCOUNTER — Inpatient Hospital Stay (HOSPITAL_BASED_OUTPATIENT_CLINIC_OR_DEPARTMENT_OTHER): Payer: MEDICARE | Admitting: Nurse Practitioner

## 2020-12-31 ENCOUNTER — Encounter: Payer: Self-pay | Admitting: Nurse Practitioner

## 2020-12-31 ENCOUNTER — Inpatient Hospital Stay: Payer: MEDICARE | Attending: Oncology

## 2020-12-31 ENCOUNTER — Other Ambulatory Visit: Payer: Self-pay

## 2020-12-31 ENCOUNTER — Inpatient Hospital Stay: Payer: MEDICARE

## 2020-12-31 VITALS — BP 126/69 | HR 64 | Temp 99.4°F | Resp 18

## 2020-12-31 VITALS — BP 140/73 | HR 84 | Temp 99.0°F | Resp 18 | Ht 71.0 in | Wt 192.0 lb

## 2020-12-31 DIAGNOSIS — C9110 Chronic lymphocytic leukemia of B-cell type not having achieved remission: Secondary | ICD-10-CM

## 2020-12-31 DIAGNOSIS — Z5111 Encounter for antineoplastic chemotherapy: Secondary | ICD-10-CM | POA: Diagnosis present

## 2020-12-31 DIAGNOSIS — N289 Disorder of kidney and ureter, unspecified: Secondary | ICD-10-CM | POA: Insufficient documentation

## 2020-12-31 DIAGNOSIS — D696 Thrombocytopenia, unspecified: Secondary | ICD-10-CM | POA: Insufficient documentation

## 2020-12-31 DIAGNOSIS — Z85828 Personal history of other malignant neoplasm of skin: Secondary | ICD-10-CM | POA: Insufficient documentation

## 2020-12-31 DIAGNOSIS — Z5112 Encounter for antineoplastic immunotherapy: Secondary | ICD-10-CM | POA: Insufficient documentation

## 2020-12-31 LAB — CBC WITH DIFFERENTIAL (CANCER CENTER ONLY)
Abs Immature Granulocytes: 0.01 10*3/uL (ref 0.00–0.07)
Basophils Absolute: 0 10*3/uL (ref 0.0–0.1)
Basophils Relative: 1 %
Eosinophils Absolute: 0.3 10*3/uL (ref 0.0–0.5)
Eosinophils Relative: 6 %
HCT: 39.2 % (ref 39.0–52.0)
Hemoglobin: 13.2 g/dL (ref 13.0–17.0)
Immature Granulocytes: 0 %
Lymphocytes Relative: 5 %
Lymphs Abs: 0.3 10*3/uL — ABNORMAL LOW (ref 0.7–4.0)
MCH: 33 pg (ref 26.0–34.0)
MCHC: 33.7 g/dL (ref 30.0–36.0)
MCV: 98 fL (ref 80.0–100.0)
Monocytes Absolute: 0.6 10*3/uL (ref 0.1–1.0)
Monocytes Relative: 11 %
Neutro Abs: 4.5 10*3/uL (ref 1.7–7.7)
Neutrophils Relative %: 77 %
Platelet Count: 105 10*3/uL — ABNORMAL LOW (ref 150–400)
RBC: 4 MIL/uL — ABNORMAL LOW (ref 4.22–5.81)
RDW: 13.2 % (ref 11.5–15.5)
WBC Count: 5.8 10*3/uL (ref 4.0–10.5)
nRBC: 0 % (ref 0.0–0.2)

## 2020-12-31 LAB — CMP (CANCER CENTER ONLY)
ALT: 9 U/L (ref 0–44)
AST: 14 U/L — ABNORMAL LOW (ref 15–41)
Albumin: 3.9 g/dL (ref 3.5–5.0)
Alkaline Phosphatase: 42 U/L (ref 38–126)
Anion gap: 7 (ref 5–15)
BUN: 17 mg/dL (ref 8–23)
CO2: 31 mmol/L (ref 22–32)
Calcium: 9 mg/dL (ref 8.9–10.3)
Chloride: 101 mmol/L (ref 98–111)
Creatinine: 1.19 mg/dL (ref 0.61–1.24)
GFR, Estimated: >60 mL/min (ref >60)
Glucose, Bld: 71 mg/dL (ref 70–99)
Potassium: 4.1 mmol/L (ref 3.5–5.1)
Sodium: 139 mmol/L (ref 135–145)
Total Bilirubin: 0.7 mg/dL (ref 0.3–1.2)
Total Protein: 6.9 g/dL (ref 6.5–8.1)

## 2020-12-31 LAB — PROTIME-INR
INR: 2.9 — ABNORMAL HIGH (ref 0.8–1.2)
Prothrombin Time: 29 seconds — ABNORMAL HIGH (ref 11.4–15.2)

## 2020-12-31 LAB — LACTATE DEHYDROGENASE: LDH: 181 U/L (ref 98–192)

## 2020-12-31 MED ORDER — PALONOSETRON HCL INJECTION 0.25 MG/5ML
0.2500 mg | Freq: Once | INTRAVENOUS | Status: AC
  Administered 2020-12-31: 0.25 mg via INTRAVENOUS

## 2020-12-31 MED ORDER — ACETAMINOPHEN 325 MG PO TABS
ORAL_TABLET | ORAL | Status: AC
  Filled 2020-12-31: qty 2

## 2020-12-31 MED ORDER — SODIUM CHLORIDE 0.9 % IV SOLN
375.0000 mg/m2 | Freq: Once | INTRAVENOUS | Status: AC
  Administered 2020-12-31: 800 mg via INTRAVENOUS
  Filled 2020-12-31: qty 30

## 2020-12-31 MED ORDER — PALONOSETRON HCL INJECTION 0.25 MG/5ML
INTRAVENOUS | Status: AC
  Filled 2020-12-31: qty 5

## 2020-12-31 MED ORDER — DIPHENHYDRAMINE HCL 25 MG PO CAPS
50.0000 mg | ORAL_CAPSULE | Freq: Once | ORAL | Status: AC
  Administered 2020-12-31: 50 mg via ORAL

## 2020-12-31 MED ORDER — SODIUM CHLORIDE 0.9 % IV SOLN
10.0000 mg | Freq: Once | INTRAVENOUS | Status: AC
  Administered 2020-12-31: 10 mg via INTRAVENOUS
  Filled 2020-12-31: qty 10

## 2020-12-31 MED ORDER — SODIUM CHLORIDE 0.9 % IV SOLN
70.0000 mg/m2 | Freq: Once | INTRAVENOUS | Status: AC
  Administered 2020-12-31: 150 mg via INTRAVENOUS
  Filled 2020-12-31: qty 6

## 2020-12-31 MED ORDER — SODIUM CHLORIDE 0.9 % IV SOLN
Freq: Once | INTRAVENOUS | Status: AC
  Filled 2020-12-31: qty 250

## 2020-12-31 MED ORDER — ACETAMINOPHEN 325 MG PO TABS
650.0000 mg | ORAL_TABLET | Freq: Once | ORAL | Status: AC
  Administered 2020-12-31: 650 mg via ORAL

## 2020-12-31 MED ORDER — DIPHENHYDRAMINE HCL 25 MG PO CAPS
ORAL_CAPSULE | ORAL | Status: AC
  Filled 2020-12-31: qty 2

## 2020-12-31 NOTE — Patient Instructions (Signed)
[facility] Discharge Instructions for Patients Receiving Chemotherapy  Today you received the following chemotherapy agents Rituxin; Bendeka  To help prevent nausea and vomiting after your treatment, we encourage you to take your nausea medication  As directed   If you develop nausea and vomiting that is not controlled by your nausea medication, call the clinic.   BELOW ARE SYMPTOMS THAT SHOULD BE REPORTED IMMEDIATELY:  *FEVER GREATER THAN 100.5 F  *CHILLS WITH OR WITHOUT FEVER  NAUSEA AND VOMITING THAT IS NOT CONTROLLED WITH YOUR NAUSEA MEDICATION  *UNUSUAL SHORTNESS OF BREATH  *UNUSUAL BRUISING OR BLEEDING  TENDERNESS IN MOUTH AND THROAT WITH OR WITHOUT PRESENCE OF ULCERS  *URINARY PROBLEMS  *BOWEL PROBLEMS  UNUSUAL RASH Items with * indicate a potential emergency and should be followed up as soon as possible.  Feel free to call the clinic should you have any questions or concerns. The clinic phone number is [phone].  Please show the CHEMO ALERT CARD at check-in to the Emergency Department and triage nurse.   

## 2020-12-31 NOTE — Progress Notes (Signed)
  [place] OFFICE PROGRESS NOTE   Diagnosis: CLL  INTERVAL HISTORY:   Mr. Bilton returns as scheduled.  He completed cycle 3 bendamustine/Rituxan 12/03/2020, 12/04/2020.  He denies nausea/vomiting.  No mouth sores.  No diarrhea.  No signs/symptoms of allergic reaction with the most recent Rituxan.  He applied Efudex to his face about 4 days ago.  He notes a "sunburn" like rash on his face.  Objective:  Vital signs in last 24 hours:  Blood pressure 140/73, pulse 84, temperature 99 F (37.2 C), temperature source Tympanic, resp. rate 18, height $RemoveBe'5\' 11"'EcsRQprsB$  (1.803 m), weight 192 lb (87.1 kg), SpO2 98 %.    HEENT: No thrush or ulcers. Lymphatics: No palpable cervical, supraclavicular or axillary lymph nodes. Resp: Lungs clear bilaterally. Cardio: Regular rate and rhythm. GI: Abdomen soft and nontender.  No hepatosplenomegaly.  No mass. Vascular: No leg edema. Skin: Face is markedly erythematous.   Lab Results:  Lab Results  Component Value Date   WBC 5.8 12/31/2020   HGB 13.2 12/31/2020   HCT 39.2 12/31/2020   MCV 98.0 12/31/2020   PLT 105 (L) 12/31/2020   NEUTROABS 4.5 12/31/2020    Imaging:  No results found.  Medications: I have reviewed the patient's current medications.  Assessment/Plan: 1. 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 He will return for a monthly PT check.  2. Indwelling IVC catheter.  3. 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  4. 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 erectile dysfunction-followed by Dr. Jeffie Pollock , status post placement of a penile implant at [facility] in April 2014  7. 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 Basal cell carcinoma removed from the left superior central forehead 01/21/2020 10.Renal insufficiency   Disposition: Mr. Nephew appears stable.  He has completed 3 cycles of bendamustine/Rituxan.  He is tolerating treatment well.  Plan to proceed with cycle 4 today as scheduled.  Dr. Benay Spice recommends restaging CTs after this cycle.  We reviewed the CBC from today.  Counts adequate to proceed with treatment.  Hemoglobin has corrected into normal range.  He will return for lab, follow-up, possible cycle 5 bendamustine/Rituxan in 4 weeks (pending CT results).  He will contact the office in the interim with any problems.  Plan reviewed with Dr. Benay Spice.    [name] ANP/GNP-BC   12/31/2020  11:54 AM

## 2021-01-01 ENCOUNTER — Ambulatory Visit: Payer: PRIVATE HEALTH INSURANCE

## 2021-01-01 ENCOUNTER — Other Ambulatory Visit: Payer: Self-pay

## 2021-01-01 ENCOUNTER — Inpatient Hospital Stay: Payer: MEDICARE

## 2021-01-01 VITALS — BP 141/74 | HR 74 | Temp 98.9°F | Resp 18

## 2021-01-01 DIAGNOSIS — Z5112 Encounter for antineoplastic immunotherapy: Secondary | ICD-10-CM | POA: Diagnosis not present

## 2021-01-01 DIAGNOSIS — C9110 Chronic lymphocytic leukemia of B-cell type not having achieved remission: Secondary | ICD-10-CM

## 2021-01-01 MED ORDER — SODIUM CHLORIDE 0.9 % IV SOLN
Freq: Once | INTRAVENOUS | Status: AC
  Filled 2021-01-01: qty 250

## 2021-01-01 MED ORDER — SODIUM CHLORIDE 0.9 % IV SOLN
10.0000 mg | Freq: Once | INTRAVENOUS | Status: AC
  Administered 2021-01-01: 10 mg via INTRAVENOUS
  Filled 2021-01-01: qty 10

## 2021-01-01 MED ORDER — SODIUM CHLORIDE 0.9 % IV SOLN
70.0000 mg/m2 | Freq: Once | INTRAVENOUS | Status: AC
  Administered 2021-01-01: 150 mg via INTRAVENOUS
  Filled 2021-01-01: qty 6

## 2021-01-01 NOTE — Patient Instructions (Signed)
[facility] Discharge Instructions for Patients Receiving Chemotherapy  Today you received the following chemotherapy agents: bendamustine.  To help prevent nausea and vomiting after your treatment, we encourage you to take your nausea medication as directed.   If you develop nausea and vomiting that is not controlled by your nausea medication, call the clinic.   BELOW ARE SYMPTOMS THAT SHOULD BE REPORTED IMMEDIATELY:  *FEVER GREATER THAN 100.5 F  *CHILLS WITH OR WITHOUT FEVER  NAUSEA AND VOMITING THAT IS NOT CONTROLLED WITH YOUR NAUSEA MEDICATION  *UNUSUAL SHORTNESS OF BREATH  *UNUSUAL BRUISING OR BLEEDING  TENDERNESS IN MOUTH AND THROAT WITH OR WITHOUT PRESENCE OF ULCERS  *URINARY PROBLEMS  *BOWEL PROBLEMS  UNUSUAL RASH Items with * indicate a potential emergency and should be followed up as soon as possible.  Feel free to call the clinic should you have any questions or concerns. The clinic phone number is [phone].  Please show the CHEMO ALERT CARD at check-in to the Emergency Department and triage nurse.   

## 2021-01-05 ENCOUNTER — Encounter: Payer: Self-pay | Admitting: Oncology

## 2021-01-05 ENCOUNTER — Other Ambulatory Visit: Payer: Self-pay | Admitting: Internal Medicine

## 2021-01-05 NOTE — Telephone Encounter (Signed)
Please refill as per office routine med refill policy (all routine meds refilled for 3 mo or monthly per pt preference up to one year from last visit, then month to month grace period for 3 mo, then further med refills will have to be denied)  

## 2021-01-06 ENCOUNTER — Encounter: Payer: Self-pay | Admitting: Oncology

## 2021-01-09 ENCOUNTER — Telehealth: Payer: Self-pay | Admitting: *Deleted

## 2021-01-09 NOTE — Telephone Encounter (Signed)
Left VM that he just left dermatology office and a punch biopsy was done on his rash. She will have results in ~ 2 weeks and will see him then. He does not feel need to have Dr. Benay Spice see it unless he wishes to. Call if he needs to come.

## 2021-01-13 ENCOUNTER — Ambulatory Visit (HOSPITAL_COMMUNITY): Payer: MEDICARE

## 2021-01-14 ENCOUNTER — Encounter: Payer: Self-pay | Admitting: Oncology

## 2021-01-19 ENCOUNTER — Other Ambulatory Visit: Payer: Self-pay

## 2021-01-19 ENCOUNTER — Ambulatory Visit (HOSPITAL_COMMUNITY)
Admission: RE | Admit: 2021-01-19 | Discharge: 2021-01-19 | Disposition: A | Discharge status: Home / Self Care | Payer: MEDICARE | Source: Ambulatory Visit | Attending: Oncology | Admitting: Oncology

## 2021-01-19 DIAGNOSIS — C9110 Chronic lymphocytic leukemia of B-cell type not having achieved remission: Secondary | ICD-10-CM | POA: Diagnosis present

## 2021-01-25 ENCOUNTER — Other Ambulatory Visit: Payer: Self-pay | Admitting: Oncology

## 2021-01-27 ENCOUNTER — Encounter: Payer: Self-pay | Admitting: Oncology

## 2021-01-28 ENCOUNTER — Other Ambulatory Visit: Payer: Self-pay

## 2021-01-28 ENCOUNTER — Inpatient Hospital Stay: Payer: MEDICARE

## 2021-01-28 ENCOUNTER — Inpatient Hospital Stay (HOSPITAL_BASED_OUTPATIENT_CLINIC_OR_DEPARTMENT_OTHER): Payer: MEDICARE | Admitting: Oncology

## 2021-01-28 ENCOUNTER — Inpatient Hospital Stay: Payer: MEDICARE | Attending: Oncology

## 2021-01-28 ENCOUNTER — Other Ambulatory Visit: Payer: Self-pay | Admitting: *Deleted

## 2021-01-28 ENCOUNTER — Encounter: Payer: Self-pay | Admitting: Oncology

## 2021-01-28 VITALS — BP 122/62 | HR 63 | Temp 99.0°F | Resp 17

## 2021-01-28 VITALS — BP 138/83 | HR 77 | Temp 97.7°F | Resp 20 | Ht 71.0 in | Wt 189.0 lb

## 2021-01-28 DIAGNOSIS — C9110 Chronic lymphocytic leukemia of B-cell type not having achieved remission: Secondary | ICD-10-CM | POA: Diagnosis not present

## 2021-01-28 DIAGNOSIS — Z85828 Personal history of other malignant neoplasm of skin: Secondary | ICD-10-CM | POA: Insufficient documentation

## 2021-01-28 DIAGNOSIS — Z86718 Personal history of other venous thrombosis and embolism: Secondary | ICD-10-CM | POA: Insufficient documentation

## 2021-01-28 DIAGNOSIS — R21 Rash and other nonspecific skin eruption: Secondary | ICD-10-CM | POA: Diagnosis not present

## 2021-01-28 DIAGNOSIS — N529 Male erectile dysfunction, unspecified: Secondary | ICD-10-CM | POA: Insufficient documentation

## 2021-01-28 DIAGNOSIS — Z5111 Encounter for antineoplastic chemotherapy: Secondary | ICD-10-CM | POA: Diagnosis not present

## 2021-01-28 DIAGNOSIS — D696 Thrombocytopenia, unspecified: Secondary | ICD-10-CM | POA: Diagnosis not present

## 2021-01-28 DIAGNOSIS — Z7901 Long term (current) use of anticoagulants: Secondary | ICD-10-CM | POA: Diagnosis not present

## 2021-01-28 DIAGNOSIS — Z298 Encounter for other specified prophylactic measures: Secondary | ICD-10-CM | POA: Insufficient documentation

## 2021-01-28 DIAGNOSIS — N2889 Other specified disorders of kidney and ureter: Secondary | ICD-10-CM | POA: Diagnosis not present

## 2021-01-28 LAB — CBC WITH DIFFERENTIAL (CANCER CENTER ONLY)
Abs Immature Granulocytes: 0.02 10*3/uL (ref 0.00–0.07)
Basophils Absolute: 0 10*3/uL (ref 0.0–0.1)
Basophils Relative: 1 %
Eosinophils Absolute: 0.5 10*3/uL (ref 0.0–0.5)
Eosinophils Relative: 11 %
HCT: 41 % (ref 39.0–52.0)
Hemoglobin: 13.6 g/dL (ref 13.0–17.0)
Immature Granulocytes: 0 %
Lymphocytes Relative: 11 %
Lymphs Abs: 0.5 10*3/uL — ABNORMAL LOW (ref 0.7–4.0)
MCH: 31.7 pg (ref 26.0–34.0)
MCHC: 33.2 g/dL (ref 30.0–36.0)
MCV: 95.6 fL (ref 80.0–100.0)
Monocytes Absolute: 0.5 10*3/uL (ref 0.1–1.0)
Monocytes Relative: 12 %
Neutro Abs: 3.1 10*3/uL (ref 1.7–7.7)
Neutrophils Relative %: 65 %
Platelet Count: 111 10*3/uL — ABNORMAL LOW (ref 150–400)
RBC: 4.29 MIL/uL (ref 4.22–5.81)
RDW: 13 % (ref 11.5–15.5)
WBC Count: 4.7 10*3/uL (ref 4.0–10.5)
nRBC: 0 % (ref 0.0–0.2)

## 2021-01-28 LAB — CMP (CANCER CENTER ONLY)
ALT: 15 U/L (ref 0–44)
AST: 18 U/L (ref 15–41)
Albumin: 4 g/dL (ref 3.5–5.0)
Alkaline Phosphatase: 38 U/L (ref 38–126)
Anion gap: 8 (ref 5–15)
BUN: 16 mg/dL (ref 8–23)
CO2: 29 mmol/L (ref 22–32)
Calcium: 9.1 mg/dL (ref 8.9–10.3)
Chloride: 102 mmol/L (ref 98–111)
Creatinine: 1.2 mg/dL (ref 0.61–1.24)
GFR, Estimated: >60 mL/min (ref >60)
Glucose, Bld: 70 mg/dL (ref 70–99)
Potassium: 4 mmol/L (ref 3.5–5.1)
Sodium: 139 mmol/L (ref 135–145)
Total Bilirubin: 0.4 mg/dL (ref 0.3–1.2)
Total Protein: 7.2 g/dL (ref 6.5–8.1)

## 2021-01-28 LAB — PROTIME-INR
INR: 3.3 — ABNORMAL HIGH (ref 0.8–1.2)
Prothrombin Time: 32.4 seconds — ABNORMAL HIGH (ref 11.4–15.2)

## 2021-01-28 LAB — LACTATE DEHYDROGENASE: LDH: 178 U/L (ref 98–192)

## 2021-01-28 MED ORDER — SODIUM CHLORIDE 0.9 % IV SOLN
10.0000 mg | Freq: Once | INTRAVENOUS | Status: AC
  Administered 2021-01-28: 10 mg via INTRAVENOUS
  Filled 2021-01-28: qty 10

## 2021-01-28 MED ORDER — ACETAMINOPHEN 325 MG PO TABS
ORAL_TABLET | ORAL | Status: AC
  Filled 2021-01-28: qty 2

## 2021-01-28 MED ORDER — DIPHENHYDRAMINE HCL 25 MG PO CAPS
50.0000 mg | ORAL_CAPSULE | Freq: Once | ORAL | Status: AC
  Administered 2021-01-28: 50 mg via ORAL

## 2021-01-28 MED ORDER — ACETAMINOPHEN 325 MG PO TABS
650.0000 mg | ORAL_TABLET | Freq: Once | ORAL | Status: AC
  Administered 2021-01-28: 650 mg via ORAL

## 2021-01-28 MED ORDER — SODIUM CHLORIDE 0.9 % IV SOLN
Freq: Once | INTRAVENOUS | Status: AC
  Filled 2021-01-28: qty 250

## 2021-01-28 MED ORDER — PALONOSETRON HCL INJECTION 0.25 MG/5ML
INTRAVENOUS | Status: AC
  Filled 2021-01-28: qty 5

## 2021-01-28 MED ORDER — PALONOSETRON HCL INJECTION 0.25 MG/5ML
0.2500 mg | Freq: Once | INTRAVENOUS | Status: AC
  Administered 2021-01-28: 0.25 mg via INTRAVENOUS

## 2021-01-28 MED ORDER — SODIUM CHLORIDE 0.9 % IV SOLN
70.0000 mg/m2 | Freq: Once | INTRAVENOUS | Status: AC
  Administered 2021-01-28: 150 mg via INTRAVENOUS
  Filled 2021-01-28: qty 6

## 2021-01-28 MED ORDER — SODIUM CHLORIDE 0.9 % IV SOLN
375.0000 mg/m2 | Freq: Once | INTRAVENOUS | Status: AC
  Administered 2021-01-28: 800 mg via INTRAVENOUS
  Filled 2021-01-28: qty 50

## 2021-01-28 MED ORDER — DIPHENHYDRAMINE HCL 25 MG PO CAPS
ORAL_CAPSULE | ORAL | Status: AC
  Filled 2021-01-28: qty 2

## 2021-01-28 NOTE — Patient Instructions (Signed)
[facility] Discharge Instructions for Patients Receiving Chemotherapy  Today you received the following chemotherapy agents: rituximab/bendamustine  To help prevent nausea and vomiting after your treatment, we encourage you to take your nausea medication as directed.   If you develop nausea and vomiting that is not controlled by your nausea medication, call the clinic.   BELOW ARE SYMPTOMS THAT SHOULD BE REPORTED IMMEDIATELY:  *FEVER GREATER THAN 100.5 F  *CHILLS WITH OR WITHOUT FEVER  NAUSEA AND VOMITING THAT IS NOT CONTROLLED WITH YOUR NAUSEA MEDICATION  *UNUSUAL SHORTNESS OF BREATH  *UNUSUAL BRUISING OR BLEEDING  TENDERNESS IN MOUTH AND THROAT WITH OR WITHOUT PRESENCE OF ULCERS  *URINARY PROBLEMS  *BOWEL PROBLEMS  UNUSUAL RASH Items with * indicate a potential emergency and should be followed up as soon as possible.  Feel free to call the clinic should you have any questions or concerns. The clinic phone number is [phone].  Please show the CHEMO ALERT CARD at check-in to the Emergency Department and triage nurse.   

## 2021-01-28 NOTE — Progress Notes (Signed)
Placed referral for Evusheld injection per Dr. Benay Spice.

## 2021-01-28 NOTE — Progress Notes (Signed)
[place] OFFICE PROGRESS NOTE   Diagnosis: CLL  INTERVAL HISTORY:   [facility] Gross completed another cycle of bendamustine/rituximab on 12/31/2020.  He tolerated the treatment well.  He developed a pruritic rash over the abdomen, arms, and legs beginning approximately 2 weeks after chemotherapy.  He was evaluated by dermatology and a punch biopsy at the left arm returned as an urticarial allergic reaction.  Dr. Martin Majestic recommends continuing rituximab.  She suspects the rash is due to rituximab.  Ch[facility] Gross reports the rash has improved.  He did not have ulceration, blisters, or mucus involvement.  He feels well.  He had a telehealth visit with Dr. Prince Solian yesterday.  She recommends continuing bendamustine/rituximab.  She also recommends Evusheld.  Objective:  Vital signs in last 24 hours:  Blood pressure 138/83, pulse 77, temperature 97.7 F (36.5 C), temperature source Tympanic, resp. rate 20, height _0  (1.803 m), weight 189 lb (85.7 kg), SpO2 98 %.    HEENT: No thrush, 2 mm ulcer at the left buccal mucosa (he reports recent dental work in this area) Lymphatics: No cervical, supraclavicular, axillary, or inguinal nodes Resp: Lungs clear bilaterally Cardio: Regular rhythm with premature beats GI: No mass, nontender, no hepatosplenomegaly Vascular: No leg edema, the right lower leg is larger than the left side  Skin: Fading flat light brown rash over the abdomen, arms, and legs   Lab Results:  Lab Results  Component Value Date   WBC 4.7 01/28/2021   HGB 13.6 01/28/2021   HCT 41.0 01/28/2021   MCV 95.6 01/28/2021   PLT 111 (L) 01/28/2021   NEUTROABS 3.1 01/28/2021    CMP  Lab Results  Component Value Date   NA 139 01/28/2021   K 4.0 01/28/2021   CL 102 01/28/2021   CO2 29 01/28/2021   GLUCOSE 70 01/28/2021   BUN 16 01/28/2021   CREATININE 1.20 01/28/2021   CALCIUM 9.1 01/28/2021   PROT 7.2 01/28/2021   ALBUMIN 4.0 01/28/2021   AST 18 01/28/2021    ALT 15 01/28/2021   ALKPHOS 38 01/28/2021   BILITOT 0.4 01/28/2021   GFRNONAA >60 01/28/2021   GFRAA 52 (L) 08/13/2020   Medications: I have reviewed the patient's current medications.   Assessment/Plan:  1. 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 He will return for a monthly PT check.  2. Indwelling IVC catheter.  3. 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  4. 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 erectile dysfunction-followed by Dr. Jeffie Pollock , status post placement of a penile implant at [facility] in April 2014  7. CT of the abdomen/pelvis at [facility] urology February 2014 for evaluation of hematuria-12 mm external iliac nodes and leftperiaortic retroperitoneal lymph node-11 mm-potentially related to CLL 8.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, peripheral lymphocytosis and small palpable lymph nodes  CT abdomen/pelvis 09/18/2019-extensive abdominal pelvic lymphadenopathy including a left periaortic node measuring 5.7 cm  08/13/2020 CLL FISH panel-no evidence of trisomy 12; no evidence of D13S319; no evidence of p53 deletion or amplification; deletion of ATM present  11/05/2020-peripheral blood TP53 mutation-negative  Cycle 1 bendamustine/Rituxan 10/08/2020, 10/09/2020  Cycle 2 bendamustine/Rituxan 11/05/2020, 11/06/2020  Cycle 3 bendamustine/Rituxan 12/03/2020, 12/04/2020  Cycle 4 bendamustine/Rituxan 12/31/2020, 01/01/2021  CTs 01/19/2021-decrease in bulky abdomen/pelvic lymphadenopathy with more widespread ill-defined "haziness "in the mesentery and retroperitoneal fat, decreased pelvic lymphadenopathy  Cycle 5 bendamustine/Rituxan 01/28/2021, 01/29/2021 9.  Basal cell carcinoma removed from the left superior central forehead 01/21/2020 10.Renal  insufficiency    Disposition: [facility] Gross has completed 4 cycles of bendamustine/rituximab.  There is been marked clinical improvement.  I reviewed the restaging CTs with him.  The remaining abdominal soft tissue thickening/haziness is likely related to treated lymphoma.  The plan is to complete 2 additional cycles of bendamustine/rituximab.  He developed a rash after the last cycle of chemotherapy, most likely secondary to rituximab.  The rash has improved.  He did not have mucocutaneous features.  He saw dermatology and a biopsy was consistent with a drug rash.  His dermatologist and Dr. Prince Solian recommend proceeding with rituximab.  I discussed the risk/benefit of Evusheld with [facility] Gross.  He agrees to proceed.  He will return for an office visit and the final planned cycle of bendamustine/rituximab in 1 month.  He will call for a recurrent rash.  The PT/INR is therapeutic.  He will continue Coumadin anticoagulation.  Betsy Coder, MD  01/28/2021  2:23 PM

## 2021-01-29 ENCOUNTER — Inpatient Hospital Stay: Payer: MEDICARE

## 2021-01-29 ENCOUNTER — Other Ambulatory Visit: Payer: Self-pay

## 2021-01-29 ENCOUNTER — Telehealth: Payer: Self-pay | Admitting: Oncology

## 2021-01-29 VITALS — BP 129/77 | HR 73 | Temp 98.5°F | Resp 17

## 2021-01-29 DIAGNOSIS — C9110 Chronic lymphocytic leukemia of B-cell type not having achieved remission: Secondary | ICD-10-CM

## 2021-01-29 DIAGNOSIS — Z5111 Encounter for antineoplastic chemotherapy: Secondary | ICD-10-CM | POA: Diagnosis not present

## 2021-01-29 MED ORDER — SODIUM CHLORIDE 0.9 % IV SOLN
Freq: Once | INTRAVENOUS | Status: AC
  Filled 2021-01-29: qty 250

## 2021-01-29 MED ORDER — SODIUM CHLORIDE 0.9 % IV SOLN
10.0000 mg | Freq: Once | INTRAVENOUS | Status: AC
  Administered 2021-01-29: 10 mg via INTRAVENOUS
  Filled 2021-01-29: qty 10

## 2021-01-29 MED ORDER — SODIUM CHLORIDE 0.9 % IV SOLN
70.0000 mg/m2 | Freq: Once | INTRAVENOUS | Status: AC
  Administered 2021-01-29: 150 mg via INTRAVENOUS
  Filled 2021-01-29: qty 6

## 2021-01-29 NOTE — Telephone Encounter (Signed)
Scheduled appointments per 3/2 los. Called patient, no answer. Left message with appointments dates and times.

## 2021-01-29 NOTE — Patient Instructions (Signed)
[facility] Discharge Instructions for Patients Receiving Chemotherapy  Today you received the following chemotherapy agents: bendamustine.  To help prevent nausea and vomiting after your treatment, we encourage you to take your nausea medication as directed.   If you develop nausea and vomiting that is not controlled by your nausea medication, call the clinic.   BELOW ARE SYMPTOMS THAT SHOULD BE REPORTED IMMEDIATELY:  *FEVER GREATER THAN 100.5 F  *CHILLS WITH OR WITHOUT FEVER  NAUSEA AND VOMITING THAT IS NOT CONTROLLED WITH YOUR NAUSEA MEDICATION  *UNUSUAL SHORTNESS OF BREATH  *UNUSUAL BRUISING OR BLEEDING  TENDERNESS IN MOUTH AND THROAT WITH OR WITHOUT PRESENCE OF ULCERS  *URINARY PROBLEMS  *BOWEL PROBLEMS  UNUSUAL RASH Items with * indicate a potential emergency and should be followed up as soon as possible.  Feel free to call the clinic should you have any questions or concerns. The clinic phone number is [phone].  Please show the CHEMO ALERT CARD at check-in to the Emergency Department and triage nurse.   

## 2021-02-09 ENCOUNTER — Telehealth: Payer: Self-pay | Admitting: Adult Health

## 2021-02-09 ENCOUNTER — Encounter: Payer: Self-pay | Admitting: Oncology

## 2021-02-09 NOTE — Telephone Encounter (Signed)
Called [facility] Gross and reviewed Evusheld administration, risks, benefits with him in detail. Will send further information to him via my chart and he will send me a message back if he wants to proceed with scheduling and receiving the injection.    Wilber Bihari, NP

## 2021-02-10 ENCOUNTER — Encounter: Payer: Self-pay | Admitting: Oncology

## 2021-02-11 ENCOUNTER — Other Ambulatory Visit: Payer: Self-pay | Admitting: Adult Health

## 2021-02-11 DIAGNOSIS — C9110 Chronic lymphocytic leukemia of B-cell type not having achieved remission: Secondary | ICD-10-CM

## 2021-02-11 NOTE — Progress Notes (Signed)
I connected by phone with [facility] Gross on 02/11/2021, 9:50 AM to discuss the potential use of a new treatment, tixagevimab/cilgavimab, for pre-exposure prophylaxis for prevention of coronavirus disease 2019 (COVID-19) caused by the SARS-CoV-2 virus.  This patient is a 75 y.o. male that meets the FDA criteria for Emergency Use Authorization of tixagevimab/cilgavimab for pre-exposure prophylaxis of COVID-19 disease. Pt meets following criteria:  Age >12 yr and weight > 40kg  Not currently infected with SARS-CoV-2 and has no known recent exposure to an individual infected with SARS-CoV-2 AND o Who has moderate to severe immune compromise due to a medical condition or receipt of immunosuppressive medications or treatments and may not mount an adequate immune response to COVID-19 vaccination or  o Vaccination with any available COVID-19 vaccine, according to the approved or authorized schedule, is not recommended due to a history of severe adverse reaction (e.g., severe allergic reaction) to a COVID-19 vaccine(s) and/or COVID-19 vaccine component(s).  o Patient meets the following definition of mod-severe immune compromised status: 6. Other actively treated hematologic malignancies or severe congenital immunodeficiency syndromes  I have spoken and communicated the following to the patient or parent/caregiver regarding COVID monoclonal antibody treatment:  1. FDA has authorized the emergency use of tixagevimab/cilgavimab for the pre-exposure prophylaxis of COVID-19 in patients with moderate-severe immunocompromised status, who meet above EUA criteria.  2. The significant known and potential risks and benefits of COVID monoclonal antibody, and the extent to which such potential risks and benefits are unknown.  3. Information on available alternative treatments and the risks and benefits of those alternatives, including clinical trials.  4. The patient or parent/caregiver has the option to accept or  refuse COVID monoclonal antibody treatment.  After reviewing this information with the patient, agree to receive tixagevimab/cilgavimab.  Scot Dock, NP, 02/11/2021, 9:50 AM

## 2021-02-12 ENCOUNTER — Telehealth: Payer: Self-pay | Admitting: Adult Health

## 2021-02-12 NOTE — Telephone Encounter (Signed)
Scheduled per 03/16 scheduled message, patient has been called and notified. 

## 2021-02-14 ENCOUNTER — Inpatient Hospital Stay: Payer: MEDICARE

## 2021-02-14 ENCOUNTER — Other Ambulatory Visit: Payer: Self-pay

## 2021-02-14 DIAGNOSIS — Z5111 Encounter for antineoplastic chemotherapy: Secondary | ICD-10-CM | POA: Diagnosis not present

## 2021-02-14 MED ORDER — CILGAVIMAB (PART OF EVUSHELD) INJECTION
300.0000 mg | Freq: Once | INTRAMUSCULAR | Status: AC
  Administered 2021-02-14: 300 mg via INTRAMUSCULAR

## 2021-02-14 MED ORDER — TIXAGEVIMAB (PART OF EVUSHELD) INJECTION
300.0000 mg | Freq: Once | INTRAMUSCULAR | Status: AC
  Administered 2021-02-14: 300 mg via INTRAMUSCULAR

## 2021-02-19 ENCOUNTER — Other Ambulatory Visit: Payer: Self-pay | Admitting: Oncology

## 2021-02-25 ENCOUNTER — Inpatient Hospital Stay: Payer: MEDICARE

## 2021-02-25 ENCOUNTER — Other Ambulatory Visit: Payer: Self-pay

## 2021-02-25 ENCOUNTER — Telehealth: Payer: Self-pay

## 2021-02-25 ENCOUNTER — Inpatient Hospital Stay (HOSPITAL_BASED_OUTPATIENT_CLINIC_OR_DEPARTMENT_OTHER): Payer: MEDICARE | Admitting: Oncology

## 2021-02-25 ENCOUNTER — Ambulatory Visit: Payer: MEDICARE | Admitting: Nurse Practitioner

## 2021-02-25 ENCOUNTER — Telehealth: Payer: Self-pay | Admitting: Oncology

## 2021-02-25 ENCOUNTER — Other Ambulatory Visit: Payer: MEDICARE

## 2021-02-25 ENCOUNTER — Ambulatory Visit: Payer: MEDICARE

## 2021-02-25 VITALS — BP 135/84 | HR 87 | Temp 98.5°F | Resp 16 | Ht 71.0 in | Wt 190.9 lb

## 2021-02-25 VITALS — BP 123/54 | HR 80 | Temp 98.3°F | Resp 18

## 2021-02-25 DIAGNOSIS — Z5111 Encounter for antineoplastic chemotherapy: Secondary | ICD-10-CM | POA: Diagnosis not present

## 2021-02-25 DIAGNOSIS — C9110 Chronic lymphocytic leukemia of B-cell type not having achieved remission: Secondary | ICD-10-CM

## 2021-02-25 DIAGNOSIS — Z7901 Long term (current) use of anticoagulants: Secondary | ICD-10-CM | POA: Diagnosis not present

## 2021-02-25 LAB — CBC WITH DIFFERENTIAL (CANCER CENTER ONLY)
Abs Immature Granulocytes: 0.02 10*3/uL (ref 0.00–0.07)
Basophils Absolute: 0 10*3/uL (ref 0.0–0.1)
Basophils Relative: 0 %
Eosinophils Absolute: 0.4 10*3/uL (ref 0.0–0.5)
Eosinophils Relative: 6 %
HCT: 41 % (ref 39.0–52.0)
Hemoglobin: 14.1 g/dL (ref 13.0–17.0)
Immature Granulocytes: 0 %
Lymphocytes Relative: 8 %
Lymphs Abs: 0.4 10*3/uL — ABNORMAL LOW (ref 0.7–4.0)
MCH: 32.1 pg (ref 26.0–34.0)
MCHC: 34.4 g/dL (ref 30.0–36.0)
MCV: 93.4 fL (ref 80.0–100.0)
Monocytes Absolute: 0.8 10*3/uL (ref 0.1–1.0)
Monocytes Relative: 13 %
Neutro Abs: 4.2 10*3/uL (ref 1.7–7.7)
Neutrophils Relative %: 73 %
Platelet Count: 117 10*3/uL — ABNORMAL LOW (ref 150–400)
RBC: 4.39 MIL/uL (ref 4.22–5.81)
RDW: 13.7 % (ref 11.5–15.5)
WBC Count: 5.8 10*3/uL (ref 4.0–10.5)
nRBC: 0 % (ref 0.0–0.2)

## 2021-02-25 LAB — CMP (CANCER CENTER ONLY)
ALT: 16 U/L (ref 0–44)
AST: 18 U/L (ref 15–41)
Albumin: 4.1 g/dL (ref 3.5–5.0)
Alkaline Phosphatase: 44 U/L (ref 38–126)
Anion gap: 10 (ref 5–15)
BUN: 31 mg/dL — ABNORMAL HIGH (ref 8–23)
CO2: 29 mmol/L (ref 22–32)
Calcium: 9 mg/dL (ref 8.9–10.3)
Chloride: 102 mmol/L (ref 98–111)
Creatinine: 1.61 mg/dL — ABNORMAL HIGH (ref 0.61–1.24)
GFR, Estimated: 44 mL/min — ABNORMAL LOW (ref >60)
Glucose, Bld: 76 mg/dL (ref 70–99)
Potassium: 4.7 mmol/L (ref 3.5–5.1)
Sodium: 141 mmol/L (ref 135–145)
Total Bilirubin: 0.5 mg/dL (ref 0.3–1.2)
Total Protein: 7.4 g/dL (ref 6.5–8.1)

## 2021-02-25 LAB — PROTIME-INR
INR: 2.9 — ABNORMAL HIGH (ref 0.8–1.2)
Prothrombin Time: 29.1 seconds — ABNORMAL HIGH (ref 11.4–15.2)

## 2021-02-25 LAB — LACTATE DEHYDROGENASE: LDH: 178 U/L (ref 98–192)

## 2021-02-25 MED ORDER — SODIUM CHLORIDE 0.9 % IV SOLN
Freq: Once | INTRAVENOUS | Status: AC
  Filled 2021-02-25: qty 250

## 2021-02-25 MED ORDER — DIPHENHYDRAMINE HCL 25 MG PO CAPS
50.0000 mg | ORAL_CAPSULE | Freq: Once | ORAL | Status: AC
Start: 2021-02-25 — End: 2021-02-25
  Administered 2021-02-25: 50 mg via ORAL

## 2021-02-25 MED ORDER — PALONOSETRON HCL INJECTION 0.25 MG/5ML
INTRAVENOUS | Status: AC
  Filled 2021-02-25: qty 5

## 2021-02-25 MED ORDER — ACETAMINOPHEN 325 MG PO TABS
ORAL_TABLET | ORAL | Status: AC
  Filled 2021-02-25: qty 2

## 2021-02-25 MED ORDER — DIPHENHYDRAMINE HCL 25 MG PO CAPS
ORAL_CAPSULE | ORAL | Status: AC
  Filled 2021-02-25: qty 2

## 2021-02-25 MED ORDER — SODIUM CHLORIDE 0.9 % IV SOLN
70.0000 mg/m2 | Freq: Once | INTRAVENOUS | Status: AC
  Administered 2021-02-25: 150 mg via INTRAVENOUS
  Filled 2021-02-25: qty 6

## 2021-02-25 MED ORDER — PALONOSETRON HCL INJECTION 0.25 MG/5ML
0.2500 mg | Freq: Once | INTRAVENOUS | Status: AC
  Administered 2021-02-25: 0.25 mg via INTRAVENOUS

## 2021-02-25 MED ORDER — SODIUM CHLORIDE 0.9 % IV SOLN
375.0000 mg/m2 | Freq: Once | INTRAVENOUS | Status: AC
  Administered 2021-02-25: 800 mg via INTRAVENOUS
  Filled 2021-02-25: qty 50

## 2021-02-25 MED ORDER — ACETAMINOPHEN 325 MG PO TABS
650.0000 mg | ORAL_TABLET | Freq: Once | ORAL | Status: AC
  Administered 2021-02-25: 650 mg via ORAL

## 2021-02-25 MED ORDER — SODIUM CHLORIDE 0.9 % IV SOLN
10.0000 mg | Freq: Once | INTRAVENOUS | Status: AC
  Administered 2021-02-25: 10 mg via INTRAVENOUS
  Filled 2021-02-25: qty 10

## 2021-02-25 NOTE — Telephone Encounter (Signed)
Appointments scheduled per 3/30 los.  Patient to get updated calendar from infusion. Secure chat was sent to Beverly Milch to provide this to patient

## 2021-02-25 NOTE — Patient Instructions (Signed)
[place] Discharge Instructions for Patients Receiving Chemotherapy  Today you received the following chemotherapy agents: rituximab/bendamustine  To help prevent nausea and vomiting after your treatment, we encourage you to take your nausea medication as directed.   If you develop nausea and vomiting that is not controlled by your nausea medication, call the clinic.   BELOW ARE SYMPTOMS THAT SHOULD BE REPORTED IMMEDIATELY:  *FEVER GREATER THAN 100.5 F  *CHILLS WITH OR WITHOUT FEVER  NAUSEA AND VOMITING THAT IS NOT CONTROLLED WITH YOUR NAUSEA MEDICATION  *UNUSUAL SHORTNESS OF BREATH  *UNUSUAL BRUISING OR BLEEDING  TENDERNESS IN MOUTH AND THROAT WITH OR WITHOUT PRESENCE OF ULCERS  *URINARY PROBLEMS  *BOWEL PROBLEMS  UNUSUAL RASH Items with * indicate a potential emergency and should be followed up as soon as possible.  Feel free to call the clinic should you have any questions or concerns. The clinic phone number is [phone].  Please show the [place] at check-in to the Emergency Department and triage nurse.

## 2021-02-25 NOTE — Progress Notes (Signed)
[place] OFFICE PROGRESS NOTE   Diagnosis: CLL, chronic anticoagulation  INTERVAL HISTORY:   [name] completed another cycle of bendamustine/rituximab beginning on 01/28/2021.  He did not have a rash following this cycle of chemotherapy.  He feels well.  No bleeding.  Good appetite.  Objective:  Vital signs in last 24 hours:  Blood pressure 135/84, pulse 87, temperature 98.5 F (36.9 C), temperature source Tympanic, resp. rate 16, height _0  (1.803 m), weight 190 lb 14.4 oz (86.6 kg), SpO2 98 %.    HEENT: No thrush or ulcers Lymphatics: No cervical, supraclavicular, axillary, or inguinal nodes Resp: Lungs clear bilaterally Cardio: Regular rate and rhythm GI: No hepatosplenomegaly, mild fullness on deep palpation in the mid upper abdomen without a discrete mass Vascular: No leg edema  Skin: Chronic stasis change at the lower legs bilaterally, mild fading dry hyperpigmented rash over the trunk    Lab Results:  Lab Results  Component Value Date   WBC 5.8 02/25/2021   HGB 14.1 02/25/2021   HCT 41.0 02/25/2021   MCV 93.4 02/25/2021   PLT 117 (L) 02/25/2021   NEUTROABS 4.2 02/25/2021    CMP  Lab Results  Component Value Date   NA 141 02/25/2021   K 4.7 02/25/2021   CL 102 02/25/2021   CO2 29 02/25/2021   GLUCOSE 76 02/25/2021   BUN 31 (H) 02/25/2021   CREATININE 1.61 (H) 02/25/2021   CALCIUM 9.0 02/25/2021   PROT 7.4 02/25/2021   ALBUMIN 4.1 02/25/2021   AST 18 02/25/2021   ALT 16 02/25/2021   ALKPHOS 44 02/25/2021   BILITOT 0.5 02/25/2021   GFRNONAA 44 (L) 02/25/2021   GFRAA 52 (L) 08/13/2020     Lab Results  Component Value Date   INR 2.9 (H) 02/25/2021    Medications: I have reviewed the patient's current medications.   Assessment/Plan: 1. 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 He will return for a monthly PT check.  2. Indwelling IVC catheter.  3. Chronic stasis  change of the low legs, on the right greater than left.  4. 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 erectile dysfunction-followed by Dr. Jeffie Pollock , status post placement of a penile implant at [facility] in April 2014  7. CT of the abdomen/pelvis at [facility] urology February 2014 for evaluation of hematuria-12 mm external iliac nodes and leftperiaortic retroperitoneal lymph node-11 mm-potentially related to CLL 8.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, peripheral lymphocytosis and small palpable lymph nodes  CT abdomen/pelvis 09/18/2019-extensive abdominal pelvic lymphadenopathy including a left periaortic node measuring 5.7 cm  08/13/2020 CLL FISH panel-no evidence of trisomy 12; no evidence of D13S319; no evidence of p53 deletion or amplification; deletion of ATM present  11/05/2020-peripheral blood TP53 mutation-negative  Cycle 1 bendamustine/Rituxan 10/08/2020, 10/09/2020  Cycle 2 bendamustine/Rituxan 11/05/2020, 11/06/2020  Cycle 3 bendamustine/Rituxan 12/03/2020, 12/04/2020  Cycle 4 bendamustine/Rituxan 12/31/2020, 01/01/2021  CTs 01/19/2021-decrease in bulky abdomen/pelvic lymphadenopathy with more widespread ill-defined "haziness "in the mesentery and retroperitoneal fat, decreased pelvic lymphadenopathy  Cycle 5 bendamustine/Rituxan 01/28/2021, 01/29/2021  Cycle 6 bendamustine/rituximab 02/25/2021, 02/26/2021 9. Basal cell carcinoma removed from the left superior central forehead 01/21/2020 10.Renal insufficiency     Disposition: Mr. Kazlauskas appears stable.  He tolerated the last cycle of bendamustine/rituximab without significant toxicity.  He will complete the final planned course of chemotherapy today.  He will return for a lab visit in 1 month and an  office visit in 2 months.  The PT/INR is in the therapeutic range.  He will continue Coumadin at the  current dose.  Betsy Coder, MD  02/25/2021  1:50 PM

## 2021-02-25 NOTE — Telephone Encounter (Signed)
Okay to treat with creatinine 1.6 spoke to [employer]

## 2021-02-26 ENCOUNTER — Inpatient Hospital Stay: Payer: MEDICARE

## 2021-02-26 VITALS — BP 134/68 | HR 63 | Temp 97.1°F | Resp 18

## 2021-02-26 DIAGNOSIS — Z5111 Encounter for antineoplastic chemotherapy: Secondary | ICD-10-CM | POA: Diagnosis not present

## 2021-02-26 DIAGNOSIS — C9110 Chronic lymphocytic leukemia of B-cell type not having achieved remission: Secondary | ICD-10-CM

## 2021-02-26 MED ORDER — SODIUM CHLORIDE 0.9 % IV SOLN
70.0000 mg/m2 | Freq: Once | INTRAVENOUS | Status: AC
  Administered 2021-02-26: 150 mg via INTRAVENOUS
  Filled 2021-02-26: qty 6

## 2021-02-26 MED ORDER — SODIUM CHLORIDE 0.9 % IV SOLN
10.0000 mg | Freq: Once | INTRAVENOUS | Status: AC
  Administered 2021-02-26: 10 mg via INTRAVENOUS
  Filled 2021-02-26: qty 10

## 2021-02-26 MED ORDER — SODIUM CHLORIDE 0.9 % IV SOLN
Freq: Once | INTRAVENOUS | Status: AC
  Filled 2021-02-26: qty 250

## 2021-02-26 NOTE — Patient Instructions (Signed)
[facility] Discharge Instructions for Patients Receiving Chemotherapy  Today you received the following chemotherapy agents Bendeka.  To help prevent nausea and vomiting after your treatment, we encourage you to take your nausea medication as directed.   If you develop nausea and vomiting that is not controlled by your nausea medication, call the clinic.   BELOW ARE SYMPTOMS THAT SHOULD BE REPORTED IMMEDIATELY:  *FEVER GREATER THAN 100.5 F  *CHILLS WITH OR WITHOUT FEVER  NAUSEA AND VOMITING THAT IS NOT CONTROLLED WITH YOUR NAUSEA MEDICATION  *UNUSUAL SHORTNESS OF BREATH  *UNUSUAL BRUISING OR BLEEDING  TENDERNESS IN MOUTH AND THROAT WITH OR WITHOUT PRESENCE OF ULCERS  *URINARY PROBLEMS  *BOWEL PROBLEMS  UNUSUAL RASH Items with * indicate a potential emergency and should be followed up as soon as possible.  Feel free to call the clinic should you have any questions or concerns. The clinic phone number is [phone].  Please show the CHEMO ALERT CARD at check-in to the Emergency Department and triage nurse.   

## 2021-03-09 ENCOUNTER — Encounter: Payer: Self-pay | Admitting: Oncology

## 2021-03-25 ENCOUNTER — Other Ambulatory Visit (HOSPITAL_BASED_OUTPATIENT_CLINIC_OR_DEPARTMENT_OTHER): Payer: Self-pay

## 2021-03-25 ENCOUNTER — Ambulatory Visit (INDEPENDENT_AMBULATORY_CARE_PROVIDER_SITE_OTHER): Payer: MEDICARE | Admitting: Family Medicine

## 2021-03-25 ENCOUNTER — Other Ambulatory Visit: Payer: Self-pay

## 2021-03-25 ENCOUNTER — Ambulatory Visit: Payer: MEDICARE | Attending: Internal Medicine

## 2021-03-25 ENCOUNTER — Inpatient Hospital Stay: Payer: MEDICARE | Attending: Oncology

## 2021-03-25 ENCOUNTER — Encounter (HOSPITAL_BASED_OUTPATIENT_CLINIC_OR_DEPARTMENT_OTHER): Payer: Self-pay | Admitting: Family Medicine

## 2021-03-25 VITALS — BP 130/70 | HR 60 | Ht 71.0 in | Wt 195.8 lb

## 2021-03-25 DIAGNOSIS — D696 Thrombocytopenia, unspecified: Secondary | ICD-10-CM | POA: Insufficient documentation

## 2021-03-25 DIAGNOSIS — N183 Chronic kidney disease, stage 3 unspecified: Secondary | ICD-10-CM | POA: Diagnosis not present

## 2021-03-25 DIAGNOSIS — Z7901 Long term (current) use of anticoagulants: Secondary | ICD-10-CM

## 2021-03-25 DIAGNOSIS — Z23 Encounter for immunization: Secondary | ICD-10-CM

## 2021-03-25 DIAGNOSIS — C9110 Chronic lymphocytic leukemia of B-cell type not having achieved remission: Secondary | ICD-10-CM | POA: Diagnosis present

## 2021-03-25 DIAGNOSIS — I10 Essential (primary) hypertension: Secondary | ICD-10-CM | POA: Diagnosis not present

## 2021-03-25 DIAGNOSIS — I749 Embolism and thrombosis of unspecified artery: Secondary | ICD-10-CM

## 2021-03-25 DIAGNOSIS — E785 Hyperlipidemia, unspecified: Secondary | ICD-10-CM

## 2021-03-25 LAB — CBC WITH DIFFERENTIAL (CANCER CENTER ONLY)
Abs Immature Granulocytes: 0.03 10*3/uL (ref 0.00–0.07)
Basophils Absolute: 0 10*3/uL (ref 0.0–0.1)
Basophils Relative: 1 %
Eosinophils Absolute: 0.5 10*3/uL (ref 0.0–0.5)
Eosinophils Relative: 7 %
HCT: 43.2 % (ref 39.0–52.0)
Hemoglobin: 14.7 g/dL (ref 13.0–17.0)
Immature Granulocytes: 1 %
Lymphocytes Relative: 8 %
Lymphs Abs: 0.5 10*3/uL — ABNORMAL LOW (ref 0.7–4.0)
MCH: 32 pg (ref 26.0–34.0)
MCHC: 34 g/dL (ref 30.0–36.0)
MCV: 94.1 fL (ref 80.0–100.0)
Monocytes Absolute: 1 10*3/uL (ref 0.1–1.0)
Monocytes Relative: 15 %
Neutro Abs: 4.6 10*3/uL (ref 1.7–7.7)
Neutrophils Relative %: 68 %
Platelet Count: 120 10*3/uL — ABNORMAL LOW (ref 150–400)
RBC: 4.59 MIL/uL (ref 4.22–5.81)
RDW: 14.4 % (ref 11.5–15.5)
WBC Count: 6.6 10*3/uL (ref 4.0–10.5)
nRBC: 0 % (ref 0.0–0.2)

## 2021-03-25 LAB — PROTIME-INR
INR: 2.8 — ABNORMAL HIGH (ref 0.8–1.2)
Prothrombin Time: 29.1 seconds — ABNORMAL HIGH (ref 11.4–15.2)

## 2021-03-25 MED ORDER — LOSARTAN POTASSIUM 50 MG PO TABS: 50.0000 mg | ORAL_TABLET | Freq: Every day | ORAL | 3 refills | Status: DC

## 2021-03-25 MED ORDER — PFIZER-BIONT COVID-19 VAC-TRIS 30 MCG/0.3ML IM SUSP
INTRAMUSCULAR | 0 refills | Status: DC
  Filled 2021-03-25: qty 0.3, 1d supply, fill #0

## 2021-03-25 NOTE — Progress Notes (Signed)
New Patient Office Visit  Subjective:  Patient ID: Chad Gross, male    DOB: Jun 19, 1946  Age: 75 y.o. MRN: [id number]  CC:  Chief Complaint  Patient presents with  . Establish Care  . Medication Refill    Patient needs renewals on his prescription medications    HPI Chad Gross is a 75 year old male presenting to establish in clinic.  Denies any specific concerns today.  Reports past medical history of hypertension, hyperlipidemia, environmental allergies, chronic kidney disease, DVT, CLL.  Hypertension: Does not check blood pressure at home, does have fairly frequent doctors visits due to monitoring with oncology.  Denies any issues with lightheadedness or dizziness.  Denies any recent chest pain or shortness of breath.  Has been taking hydrochlorothiazide and losartan without issue.  Needing refills on these medications today.  Hyperlipidemia: Managed on pravastatin, needing refill today.  Denies any issues with myalgia, no change in urine color.  Chronic kidney disease: Does have regular labs completed with oncology which have shown stable kidney function.  Takes losartan as above.  Environmental allergies: Currently manages with levocetirizine.  Has some questions regarding alternative medications or utilizing OTC medications due to cost of prescription medication.  CLL and recurrent DVT: Follows with hematology/oncology regarding treatments for CLL as well as monitoring of warfarin treatment as management for recurrent DVTs.  Has been stable on dosing of warfarin.  Generally sees his oncologist every 1 to 2 months with labs typically performed monthly.  Past Medical History:  Diagnosis Date  . Chronic foot pain, right 02/07/2018  . CLL (chronic lymphocytic leukemia) ([place]) 02/07/2018  . ED (erectile dysfunction)   . History of DVT (deep vein thrombosis)   . Hyperlipidemia   . Hypertension   . Pericarditis   . Pulmonary embolism [facility])     Past Surgical History:   Procedure Laterality Date  . CARDIAC CATHETERIZATION  01/21/2011   EF 50-55%  . GREENFIELD FLITER PLACEMENT    . INGUINAL HERNIA REPAIR     BILATERAL  . KNEE ARTHROSCOPY Right 05/04/2018   Procedure: ARTHROSCOPY KNEE, PARTIAL MENISCECTOMY AND CHONDROPLASTY;  Surgeon: Melrose Nakayama, MD;  Location: [facility];  Service: Orthopedics;  Laterality: Right;    Family History  Problem Relation Age of Onset  . Breast cancer Mother   . Hypertension Mother   . Heart failure Father   . Pneumonia Father   . Hypertension Father     Social History   Socioeconomic History  . Marital status: Divorced    Spouse name: Not on file  . Number of children: Not on file  . Years of education: Not on file  . Highest education level: Not on file  Occupational History  . Not on file  Tobacco Use  . Smoking status: Never Smoker  . Smokeless tobacco: Never Used  Substance and Sexual Activity  . Alcohol use: Yes    Comment: nightly  . Drug use: No  . Sexual activity: Not on file  Other Topics Concern  . Not on file  Social History Narrative  . Not on file   Social Determinants of Health   Financial Resource Strain: Not on file  Food Insecurity: Not on file  Transportation Needs: Not on file  Physical Activity: Not on file  Stress: Not on file  Social Connections: Not on file  Intimate Partner Violence: Not on file    Objective:   Today's Vitals: BP 130/70   Pulse 60   Ht 5\' 11"  (1.803  m)   Wt 195 lb 12.8 oz (88.8 kg)   SpO2 99%   BMI 27.31 kg/m   Physical Exam  Pleasant 75 year old male in no acute distress Cardiovascular exam with regular rate and rhythm, no murmurs appreciated Lungs clear to auscultation bilaterally  Assessment & Plan:   Problem List Items Addressed This Visit      Cardiovascular and Mediastinum   Benign essential hypertension - Primary    Chronic, at goal in office today By history, no suggestion of hypotension at home Will continue with current regimen  of losartan and HCTZ, refills sent to pharmacy Monitor labs as completed with hematology/oncology      Relevant Medications   losartan (COZAAR) 50 MG tablet     Genitourinary   CKD (chronic kidney disease) stage 3, GFR 30-59 ml/min (HCC)    Has been stable on labs completed intermittently with oncologist Continued use of losartan Continue to monitor renal function on labs      Relevant Medications   losartan (COZAAR) 50 MG tablet     Other   Hyperlipidemia    No adverse effects related to statin therapy Continue with pravastatin, refill sent to pharmacy Continue to monitor labs      Relevant Medications   losartan (COZAAR) 50 MG tablet   Thrombocytopenia, unspecified (HCC)    Observed on labs, likely related to known CLL, managed with hematologist/oncologist       Other Visit Diagnoses    Embolism and thrombosis (HCC)   (Chronic)     Relevant Medications   losartan (COZAAR) 50 MG tablet      Outpatient Encounter Medications as of 03/25/2021  Medication Sig  . acetaminophen (TYLENOL) 500 MG tablet Take 500-1,000 mg by mouth every 6 (six) hours as needed for moderate pain or headache.  . Calcium-Magnesium-Zinc 167-83-8 MG TABS Take by mouth.  . clindamycin (CLEOCIN T) 1 % lotion Apply 1 application topically 2 (two) times daily.  . Coenzyme Q10 (COQ10) 100 MG CAPS Take 100 mg by mouth daily.  Marland Kitchen GLUCOSAMINE CHONDROITIN COMPLX PO Take 1 tablet by mouth daily.  . hydrochlorothiazide (HYDRODIURIL) 12.5 MG tablet TAKE 1 TABLET BY MOUTH EVERY DAY  . hydrocortisone (ANUSOL-HC) 2.5 % rectal cream Place 1 application rectally 2 (two) times daily.  . Multiple Vitamins-Minerals (SENIOR MULTIVITAMIN PLUS PO) Take 1 tablet by mouth at bedtime.  . pravastatin (PRAVACHOL) 40 MG tablet TAKE 1 TABLET BY MOUTH EVERY DAY  . PRESCRIPTION MEDICATION Apply topically 2 (two) times daily. Fluorouracil 5% + Calcipotriene 0.005%--apply to face  . prochlorperazine (COMPAZINE) 5 MG tablet Take 1  tablet (5 mg total) by mouth every 6 (six) hours as needed for nausea or vomiting.  . traMADol (ULTRAM) 50 MG tablet TAKE 1 TABLET (50 MG TOTAL) BY MOUTH EVERY 6 HOURS AS NEEDED FOR MODERATE PAIN. MUST LAST 90 DAYS  . warfarin (COUMADIN) 10 MG tablet TAKE 1 TABLET (10 MG TOTAL) BY MOUTH DAILY AT 6 PM. TAKE 1 TABLET DAILY AS DIRECTED.  Marland Kitchen warfarin (COUMADIN) 2.5 MG tablet Take 1 tablet (2.5 mg total) by mouth every Monday, Wednesday, and Friday. For total dose of 12.5mg  on MWF.  . [DISCONTINUED] losartan (COZAAR) 50 MG tablet Take 1 tablet (50 mg total) by mouth daily. Overdue for annual appt w/labs must see provider for refills  . losartan (COZAAR) 50 MG tablet Take 1 tablet (50 mg total) by mouth daily. Overdue for annual appt w/labs must see provider for refills   No facility-administered encounter medications  on file as of 03/25/2021.   Spent 45 minutes on this patient encounter, including preparation, chart review, face-to-face counseling with patient and coordination of care, and documentation of encounter  Follow-up: Return in about 6 months (around 09/24/2021).   Lamoine Magallon J De [place], MD

## 2021-03-25 NOTE — Assessment & Plan Note (Signed)
Has been stable on labs completed intermittently with oncologist Continued use of losartan Continue to monitor renal function on labs

## 2021-03-25 NOTE — Progress Notes (Signed)
   Covid-19 Vaccination Clinic  Name:  Chad Gross    MRN: [id number] DOB: 1946-05-20  03/25/2021  [name] was observed post Covid-19 immunization for 15 minutes without incident. He was provided with Vaccine Information Sheet and instruction to access the V-Safe system.   [name] was instructed to call 911 with any severe reactions post vaccine: Marland Kitchen Difficulty breathing  . Swelling of face and throat  . A fast heartbeat  . A bad rash all over body  . Dizziness and weakness   Immunizations Administered    Name Date Dose VIS Date Route   PFIZER Comrnaty(Gray TOP) Covid-19 Vaccine 03/25/2021 11:47 AM 0.3 mL 11/06/2020 Intramuscular   Manufacturer: [place]   Lot: [id number]   NDC: [phone]

## 2021-03-25 NOTE — Patient Instructions (Signed)
  Medication Instructions:  Your physician recommends that you continue on your current medications as directed. Please refer to the Current Medication list given to you today. --If you need a refill on any your medications before your next appointment, please call your pharmacy first. If no refills are authorized on file call the office.--  Follow-Up: Your next appointment:   Your physician recommends that you schedule a follow-up appointment in: 6 MONTHS with Dr. de [place]  Thanks for letting us be apart of your health journey!!  Primary Care and Sports Medicine   Dr. de [place] and Worthy Keeler, DNP, AGNP  We recommend signing up for the patient portal called "MyChart".  Sign up information is provided on this After Visit Summary.  MyChart is used to connect with patients for Virtual Visits (Telemedicine).  Patients are able to view lab/test results, encounter notes, upcoming appointments, etc.  Non-urgent messages can be sent to your provider as well.   To learn more about what you can do with MyChart, please visit --  [email or web address].

## 2021-03-25 NOTE — Assessment & Plan Note (Signed)
Chronic, at goal in office today By history, no suggestion of hypotension at home Will continue with current regimen of losartan and HCTZ, refills sent to pharmacy Monitor labs as completed with hematology/oncology

## 2021-03-25 NOTE — Assessment & Plan Note (Signed)
No adverse effects related to statin therapy Continue with pravastatin, refill sent to pharmacy Continue to monitor labs

## 2021-03-25 NOTE — Assessment & Plan Note (Signed)
Observed on labs, likely related to known CLL, managed with hematologist/oncologist

## 2021-03-26 ENCOUNTER — Telehealth: Payer: Self-pay

## 2021-03-26 ENCOUNTER — Ambulatory Visit: Payer: MEDICARE

## 2021-03-26 NOTE — Telephone Encounter (Signed)
-----   Message from Ladell Pier, MD sent at 03/25/2021  2:56 PM EDT ----- Please call patient, CBC looks good, PT/INR is therapeutic, continue Coumadin at same dose

## 2021-03-26 NOTE — Telephone Encounter (Signed)
VM message informing Pt of lab results as well as PT/INR are therapeutic. Also informed Pt to continue coumadin at same dose.

## 2021-04-04 ENCOUNTER — Encounter (HOSPITAL_BASED_OUTPATIENT_CLINIC_OR_DEPARTMENT_OTHER): Payer: Self-pay | Admitting: Family Medicine

## 2021-04-06 NOTE — Telephone Encounter (Signed)
Called patient to inquire about the status of the bump that appeared on his right eye on friday Left a voicemail for patient to contact the office if he would like to schedule an appointment or if he has had resolution of the issue

## 2021-04-07 ENCOUNTER — Other Ambulatory Visit: Payer: Self-pay

## 2021-04-07 ENCOUNTER — Ambulatory Visit (HOSPITAL_BASED_OUTPATIENT_CLINIC_OR_DEPARTMENT_OTHER)
Admission: RE | Admit: 2021-04-07 | Discharge: 2021-04-07 | Disposition: A | Discharge status: Home / Self Care | Payer: MEDICARE | Source: Ambulatory Visit | Attending: Family Medicine | Admitting: Family Medicine

## 2021-04-07 ENCOUNTER — Encounter (HOSPITAL_BASED_OUTPATIENT_CLINIC_OR_DEPARTMENT_OTHER): Payer: Self-pay | Admitting: Family Medicine

## 2021-04-07 ENCOUNTER — Ambulatory Visit (INDEPENDENT_AMBULATORY_CARE_PROVIDER_SITE_OTHER): Payer: MEDICARE | Admitting: Family Medicine

## 2021-04-07 VITALS — BP 122/68 | HR 82 | Ht 71.0 in | Wt 200.4 lb

## 2021-04-07 DIAGNOSIS — M79671 Pain in right foot: Secondary | ICD-10-CM

## 2021-04-07 DIAGNOSIS — W57XXXA Bitten or stung by nonvenomous insect and other nonvenomous arthropods, initial encounter: Secondary | ICD-10-CM | POA: Insufficient documentation

## 2021-04-07 DIAGNOSIS — G8929 Other chronic pain: Secondary | ICD-10-CM | POA: Insufficient documentation

## 2021-04-07 NOTE — Patient Instructions (Addendum)
  Medication Instructions:  Your physician recommends that you continue on your current medications as directed. Please refer to the Current Medication list given to you today. --If you need a refill on any your medications before your next appointment, please call your pharmacy first. If no refills are authorized on file call the office.--  Referrals/Procedures/Imaging: Your physician recommends that you have an X-ray of your Right Foot.  X-rays use invisible electromagnetic energy beams to produce images of internal tissues, bones, and organs on film or digital media. Standard X-rays are performed for many reasons, including diagnosing tumors or bone injuries.    You may have these images done at the [facility] located at [facility] at [facility] on the ground floor, [phone]. They are a walk-in imaging facility. The hours of operation are:   Monday -- 7:30 AM - 5:00 PM Tuesday -- 7:30 AM - 5:00 PM Wednesday -- 7:30 AM - 5:00 PM Thursday -- 7:30 AM - 5:00 PM Friday -- 7:30 AM - 5:00 PM Saturday -- Closed Sunday -- Closed  Follow-Up: Your next appointment:   Your physician recommends that you schedule a follow-up appointment in: AS NEEDED with Dr. de [place]  Thanks for letting us be apart of your health journey!!  Primary Care and Sports Medicine   Dr. de [place] and Worthy Keeler, DNP, AGNP  We recommend signing up for the patient portal called "MyChart".  Sign up information is provided on this After Visit Summary.  MyChart is used to connect with patients for Virtual Visits (Telemedicine).  Patients are able to view lab/test results, encounter notes, upcoming appointments, etc.  Non-urgent messages can be sent to your provider as well.   To learn more about what you can do with MyChart, please visit --  [email or web address].

## 2021-04-07 NOTE — Assessment & Plan Note (Signed)
Acute, active Area appears consistent with bite from an insect Patient has noticed improvement and no obvious signs of significant cellulitis developing Recommend that he can continue with as needed use of steroid cream to help with symptoms as needed, advised against prolonged use given potential for thinning of the skin Cautioned that if he should develop worsening symptoms such as increased pain, swelling, vision changes, pain with eye movements, that he should contact his ophthalmologist or seek emergent medical care, patient voiced understanding

## 2021-04-07 NOTE — Progress Notes (Signed)
    Procedures performed today:    None.  Independent interpretation of notes and tests performed by another provider:   None.  Brief History, Exam, Impression, and Recommendations:    Chad Gross is a 75 year old male presenting for evaluation of suspected bug bite on his forehead as well as pain over the lateral right foot.  Bug bite: Recalls about 5 to 6 days ago a friend commented that he had a small raised bump on his forehead that looks like a bug bite.  He endorses mild discomfort over the area as well as some itching.  He has tried topical treatments including a steroid cream that he had at home.  Initially noted some gradual increase in size of the bump but has noticed that over the past 1 to 2 days it has decreased in size with less discomfort.  Denies any fevers, chills, visual symptoms, pain with eye movements.  Right foot pain: Reports chronic intermittent history of pain over lateral right foot.  Pain would tend to occur about once every 1 to 2 months.  He has seen podiatry in the past for this with various conservative treatments tried all with short-lived improvement.  Describes the pain as burning.  Worse with direct pressure over the area, indicates this is over proximal half of fifth metatarsal.  No swelling, no numbness or tingling.  BP 122/68   Pulse 82   Ht 5\' 11"  (1.803 m)   Wt 200 lb 6.4 oz (90.9 kg)   SpO2 98%   BMI 27.95 kg/m   Pleasant 76 year old male in no acute distress Small area of erythema and swelling present above right eyebrow, medial aspect.  Area with some firmness, no fluctuance, mild tenderness to palpation Right foot with mild to moderate tenderness to palpation in various areas along proximal half of fifth metatarsal and slightly over area of peroneal tendon insertion.  No noticeable swelling, no numbness or tingling through the foot.  Negative Tinel's at the ankle and over fibular head.  Chronic foot pain, right Uncertain etiology Will check foot  x-rays given bony tenderness along proximal aspect of fifth metatarsal to rule out any underlying bony abnormality Continue with conservative measures  Bug bite Acute, active Area appears consistent with bite from an insect Patient has noticed improvement and no obvious signs of significant cellulitis developing Recommend that he can continue with as needed use of steroid cream to help with symptoms as needed, advised against prolonged use given potential for thinning of the skin Cautioned that if he should develop worsening symptoms such as increased pain, swelling, vision changes, pain with eye movements, that he should contact his ophthalmologist or seek emergent medical care, patient voiced understanding    ___________________________________________ Tedra Coppernoll de [place], MD, ABFM, CAQSM Primary Care and [place]

## 2021-04-07 NOTE — Assessment & Plan Note (Signed)
Uncertain etiology Will check foot x-rays given bony tenderness along proximal aspect of fifth metatarsal to rule out any underlying bony abnormality Continue with conservative measures

## 2021-04-08 ENCOUNTER — Other Ambulatory Visit: Payer: Self-pay | Admitting: Oncology

## 2021-04-08 DIAGNOSIS — I82409 Acute embolism and thrombosis of unspecified deep veins of unspecified lower extremity: Secondary | ICD-10-CM

## 2021-04-09 ENCOUNTER — Telehealth (HOSPITAL_BASED_OUTPATIENT_CLINIC_OR_DEPARTMENT_OTHER): Payer: Self-pay

## 2021-04-09 NOTE — Telephone Encounter (Signed)
Results released by Dr. de [place] and reviewed by patient via MyChart Instructed patient to contact the office with any questions or concerns.  

## 2021-04-09 NOTE — Telephone Encounter (Signed)
-----   Message from Raymond J de [place], MD sent at 04/09/2021 10:47 AM EDT ----- Normal right foot x-rays with no acute bony abnormality observed.

## 2021-04-22 ENCOUNTER — Inpatient Hospital Stay: Payer: MEDICARE | Attending: Oncology

## 2021-04-22 ENCOUNTER — Inpatient Hospital Stay (HOSPITAL_BASED_OUTPATIENT_CLINIC_OR_DEPARTMENT_OTHER): Payer: MEDICARE | Admitting: Oncology

## 2021-04-22 ENCOUNTER — Encounter (HOSPITAL_BASED_OUTPATIENT_CLINIC_OR_DEPARTMENT_OTHER): Payer: Self-pay | Admitting: Family Medicine

## 2021-04-22 ENCOUNTER — Encounter: Payer: Self-pay | Admitting: Oncology

## 2021-04-22 ENCOUNTER — Other Ambulatory Visit: Payer: Self-pay

## 2021-04-22 VITALS — BP 135/73 | HR 69 | Temp 98.8°F | Resp 20 | Ht 71.0 in | Wt 198.4 lb

## 2021-04-22 DIAGNOSIS — Z79899 Other long term (current) drug therapy: Secondary | ICD-10-CM | POA: Diagnosis not present

## 2021-04-22 DIAGNOSIS — D696 Thrombocytopenia, unspecified: Secondary | ICD-10-CM | POA: Diagnosis not present

## 2021-04-22 DIAGNOSIS — Z85828 Personal history of other malignant neoplasm of skin: Secondary | ICD-10-CM | POA: Insufficient documentation

## 2021-04-22 DIAGNOSIS — Z7901 Long term (current) use of anticoagulants: Secondary | ICD-10-CM | POA: Diagnosis not present

## 2021-04-22 DIAGNOSIS — C9110 Chronic lymphocytic leukemia of B-cell type not having achieved remission: Secondary | ICD-10-CM | POA: Diagnosis present

## 2021-04-22 DIAGNOSIS — N529 Male erectile dysfunction, unspecified: Secondary | ICD-10-CM | POA: Diagnosis not present

## 2021-04-22 DIAGNOSIS — N289 Disorder of kidney and ureter, unspecified: Secondary | ICD-10-CM | POA: Diagnosis not present

## 2021-04-22 LAB — CBC WITH DIFFERENTIAL (CANCER CENTER ONLY)
Abs Immature Granulocytes: 0.03 10*3/uL (ref 0.00–0.07)
Basophils Absolute: 0 10*3/uL (ref 0.0–0.1)
Basophils Relative: 1 %
Eosinophils Absolute: 0.4 10*3/uL (ref 0.0–0.5)
Eosinophils Relative: 7 %
HCT: 40 % (ref 39.0–52.0)
Hemoglobin: 13.4 g/dL (ref 13.0–17.0)
Immature Granulocytes: 1 %
Lymphocytes Relative: 12 %
Lymphs Abs: 0.7 10*3/uL (ref 0.7–4.0)
MCH: 32 pg (ref 26.0–34.0)
MCHC: 33.5 g/dL (ref 30.0–36.0)
MCV: 95.5 fL (ref 80.0–100.0)
Monocytes Absolute: 0.8 10*3/uL (ref 0.1–1.0)
Monocytes Relative: 15 %
Neutro Abs: 3.8 10*3/uL (ref 1.7–7.7)
Neutrophils Relative %: 64 %
Platelet Count: 120 10*3/uL — ABNORMAL LOW (ref 150–400)
RBC: 4.19 MIL/uL — ABNORMAL LOW (ref 4.22–5.81)
RDW: 14.2 % (ref 11.5–15.5)
WBC Count: 5.8 10*3/uL (ref 4.0–10.5)
nRBC: 0 % (ref 0.0–0.2)

## 2021-04-22 LAB — PROTIME-INR
INR: 2.3 — ABNORMAL HIGH (ref 0.8–1.2)
Prothrombin Time: 25.2 seconds — ABNORMAL HIGH (ref 11.4–15.2)

## 2021-04-22 MED ORDER — MECLIZINE HCL 12.5 MG PO TABS: 12.5000 mg | ORAL_TABLET | Freq: Three times a day (TID) | ORAL | 0 refills | Status: DC | PRN

## 2021-04-22 NOTE — Telephone Encounter (Signed)
External referral faxed to [facility] at [facility] for Vestibular PT (p) [phone] (f) [phone]

## 2021-04-22 NOTE — Progress Notes (Signed)
[place] [facility] OFFICE PROGRESS NOTE   Diagnosis: CLL, chronic anticoagulation  INTERVAL HISTORY:   Mr. Bentler returns as scheduled.  No fever or night sweats.  No palpable lymph nodes.  He reports vertigo when he lies down.  This resolves when he stands up.  This has been a symptom in the past.  He takes meclizine as needed.  Objective:  Vital signs in last 24 hours:  Blood pressure 135/73, pulse 69, temperature 98.8 F (37.1 C), temperature source Oral, resp. rate 20, height '5\' 11"'  (1.803 m), weight 198 lb 6.4 oz (90 kg), SpO2 98 %.    Lymphatics: No cervical, supraclavicular, axillary, or inguinal nodes Resp: End inspiratory rales at the posterior base bilaterally, no respiratory distress Cardio: Regular rate and rhythm GI: No hepatosplenomegaly, firm fullness on deep palpation in the mid abdomen above the umbilicus, no discrete mass Vascular: Chronic stasis change at the lower leg bilaterally, no edema Neuro: No nystagmus    Portacath/PICC-without erythema  Lab Results:  Lab Results  Component Value Date   WBC 5.8 04/22/2021   HGB 13.4 04/22/2021   HCT 40.0 04/22/2021   MCV 95.5 04/22/2021   PLT 120 (L) 04/22/2021   NEUTROABS 3.8 04/22/2021    CMP  Lab Results  Component Value Date   NA 141 02/25/2021   K 4.7 02/25/2021   CL 102 02/25/2021   CO2 29 02/25/2021   GLUCOSE 76 02/25/2021   BUN 31 (H) 02/25/2021   CREATININE 1.61 (H) 02/25/2021   CALCIUM 9.0 02/25/2021   PROT 7.4 02/25/2021   ALBUMIN 4.1 02/25/2021   AST 18 02/25/2021   ALT 16 02/25/2021   ALKPHOS 44 02/25/2021   BILITOT 0.5 02/25/2021   GFRNONAA 44 (L) 02/25/2021   GFRAA 52 (L) 08/13/2020      Lab Results  Component Value Date   INR 2.3 (H) 04/22/2021     Medications: I have reviewed the patient's current medications.   Assessment/Plan: 1. 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 He will return for a monthly PT  check.  2. Indwelling IVC catheter.  3. 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  4. 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 erectile dysfunction-followed by Dr. Jeffie Pollock , status post placement of a penile implant at [facility] in April 2014  7. CT of the abdomen/pelvis at [facility] urology February 2014 for evaluation of hematuria-12 mm external iliac nodes and leftperiaortic retroperitoneal lymph node-11 mm-potentially related to CLL 8.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, peripheral lymphocytosis and small palpable lymph nodes  CT abdomen/pelvis 09/18/2019-extensive abdominal pelvic lymphadenopathy including a left periaortic node measuring 5.7 cm  08/13/2020 CLL FISH panel-no evidence of trisomy 12; no evidence of [id number]; no evidence of p53 deletion or amplification; deletion of ATM present  11/05/2020-peripheral blood TP53 mutation-negative  Cycle 1 bendamustine/Rituxan 10/08/2020, 10/09/2020  Cycle 2 bendamustine/Rituxan 11/05/2020, 11/06/2020  Cycle 3 bendamustine/Rituxan 12/03/2020, 12/04/2020  Cycle 4 bendamustine/Rituxan 12/31/2020, 01/01/2021  CTs 01/19/2021-decrease in bulky abdomen/pelvic lymphadenopathy with more widespread ill-defined "haziness "in the mesentery and retroperitoneal fat, decreased pelvic lymphadenopathy  Cycle 5 bendamustine/Rituxan 01/28/2021, 01/29/2021  Cycle 6 bendamustine/rituximab 02/25/2021, 02/26/2021 9. Basal cell carcinoma removed from the left superior central forehead 01/21/2020 10.Renal insufficiency       Disposition: Mr. Wolfinger is in clinical remission from CLL.  The PT/INR is therapeutic.  He will return for a lab visit in  6 weeks and an office visit in 3 months.  He appears to have positional vertigo.  I refilled a prescription for meclizine.  He plans to follow-up with his primary  provider for evaluation of the vertigo.  He will request a referral to vestibular physical therapy as needed.  He has received the COVID-19 booster vaccines and Evusheld therapy.  Betsy Coder, MD  04/22/2021  12:11 PM

## 2021-05-02 ENCOUNTER — Encounter: Payer: Self-pay | Admitting: Oncology

## 2021-05-03 ENCOUNTER — Encounter: Payer: Self-pay | Admitting: Oncology

## 2021-05-11 ENCOUNTER — Encounter: Payer: Self-pay | Admitting: *Deleted

## 2021-05-11 ENCOUNTER — Encounter (HOSPITAL_BASED_OUTPATIENT_CLINIC_OR_DEPARTMENT_OTHER): Payer: Self-pay | Admitting: Family Medicine

## 2021-06-03 ENCOUNTER — Inpatient Hospital Stay: Payer: MEDICARE | Attending: Oncology

## 2021-06-03 ENCOUNTER — Other Ambulatory Visit: Payer: Self-pay

## 2021-06-03 DIAGNOSIS — Z7901 Long term (current) use of anticoagulants: Secondary | ICD-10-CM

## 2021-06-03 DIAGNOSIS — C9110 Chronic lymphocytic leukemia of B-cell type not having achieved remission: Secondary | ICD-10-CM | POA: Diagnosis present

## 2021-06-03 LAB — CBC WITH DIFFERENTIAL (CANCER CENTER ONLY)
Abs Immature Granulocytes: 0.03 10*3/uL (ref 0.00–0.07)
Basophils Absolute: 0 10*3/uL (ref 0.0–0.1)
Basophils Relative: 1 %
Eosinophils Absolute: 0.5 10*3/uL (ref 0.0–0.5)
Eosinophils Relative: 7 %
HCT: 40.7 % (ref 39.0–52.0)
Hemoglobin: 13.7 g/dL (ref 13.0–17.0)
Immature Granulocytes: 1 %
Lymphocytes Relative: 12 %
Lymphs Abs: 0.8 10*3/uL (ref 0.7–4.0)
MCH: 32.3 pg (ref 26.0–34.0)
MCHC: 33.7 g/dL (ref 30.0–36.0)
MCV: 96 fL (ref 80.0–100.0)
Monocytes Absolute: 0.9 10*3/uL (ref 0.1–1.0)
Monocytes Relative: 14 %
Neutro Abs: 4.4 10*3/uL (ref 1.7–7.7)
Neutrophils Relative %: 65 %
Platelet Count: 123 10*3/uL — ABNORMAL LOW (ref 150–400)
RBC: 4.24 MIL/uL (ref 4.22–5.81)
RDW: 12.8 % (ref 11.5–15.5)
WBC Count: 6.6 10*3/uL (ref 4.0–10.5)
nRBC: 0 % (ref 0.0–0.2)

## 2021-06-03 LAB — PROTIME-INR
INR: 1.9 — ABNORMAL HIGH (ref 0.8–1.2)
Prothrombin Time: 21.9 seconds — ABNORMAL HIGH (ref 11.4–15.2)

## 2021-06-04 ENCOUNTER — Telehealth: Payer: Self-pay

## 2021-06-04 NOTE — Telephone Encounter (Signed)
-----   Message from Ladell Pier, MD sent at 06/03/2021  1:22 PM EDT ----- Please call patient, continue Coumadin at same dose, follow-up as scheduled

## 2021-06-04 NOTE — Telephone Encounter (Signed)
TC to Pt informed Per Dr Benay Spice to continue same coumadin dose and follow up as scheduled. Pt verbalized understanding

## 2021-06-26 ENCOUNTER — Encounter (HOSPITAL_BASED_OUTPATIENT_CLINIC_OR_DEPARTMENT_OTHER): Payer: Self-pay | Admitting: Family Medicine

## 2021-07-10 ENCOUNTER — Encounter: Payer: Self-pay | Admitting: Oncology

## 2021-07-11 ENCOUNTER — Encounter: Payer: Self-pay | Admitting: Oncology

## 2021-07-14 ENCOUNTER — Encounter: Payer: Self-pay | Admitting: Oncology

## 2021-07-15 ENCOUNTER — Inpatient Hospital Stay: Payer: MEDICARE | Attending: Oncology | Admitting: Oncology

## 2021-07-15 ENCOUNTER — Inpatient Hospital Stay: Payer: MEDICARE

## 2021-07-15 ENCOUNTER — Other Ambulatory Visit: Payer: Self-pay

## 2021-07-15 VITALS — BP 113/72 | HR 83 | Temp 98.6°F | Resp 18 | Ht 71.0 in | Wt 202.8 lb

## 2021-07-15 DIAGNOSIS — D696 Thrombocytopenia, unspecified: Secondary | ICD-10-CM | POA: Insufficient documentation

## 2021-07-15 DIAGNOSIS — Z7901 Long term (current) use of anticoagulants: Secondary | ICD-10-CM

## 2021-07-15 DIAGNOSIS — C9110 Chronic lymphocytic leukemia of B-cell type not having achieved remission: Secondary | ICD-10-CM | POA: Diagnosis present

## 2021-07-15 LAB — CBC WITH DIFFERENTIAL (CANCER CENTER ONLY)
Abs Immature Granulocytes: 0.03 10*3/uL (ref 0.00–0.07)
Basophils Absolute: 0 10*3/uL (ref 0.0–0.1)
Basophils Relative: 0 %
Eosinophils Absolute: 0.5 10*3/uL (ref 0.0–0.5)
Eosinophils Relative: 7 %
HCT: 41.2 % (ref 39.0–52.0)
Hemoglobin: 13.9 g/dL (ref 13.0–17.0)
Immature Granulocytes: 0 %
Lymphocytes Relative: 17 %
Lymphs Abs: 1.2 10*3/uL (ref 0.7–4.0)
MCH: 32 pg (ref 26.0–34.0)
MCHC: 33.7 g/dL (ref 30.0–36.0)
MCV: 94.7 fL (ref 80.0–100.0)
Monocytes Absolute: 1 10*3/uL (ref 0.1–1.0)
Monocytes Relative: 14 %
Neutro Abs: 4.3 10*3/uL (ref 1.7–7.7)
Neutrophils Relative %: 62 %
Platelet Count: 125 10*3/uL — ABNORMAL LOW (ref 150–400)
RBC: 4.35 MIL/uL (ref 4.22–5.81)
RDW: 12.9 % (ref 11.5–15.5)
WBC Count: 7.1 10*3/uL (ref 4.0–10.5)
nRBC: 0 % (ref 0.0–0.2)

## 2021-07-15 LAB — PROTIME-INR
INR: 2.3 — ABNORMAL HIGH (ref 0.8–1.2)
Prothrombin Time: 24.9 seconds — ABNORMAL HIGH (ref 11.4–15.2)

## 2021-07-15 NOTE — Progress Notes (Signed)
[place] OFFICE PROGRESS NOTE   Diagnosis: CLL, hypercoagulation  INTERVAL HISTORY:   Chad Scheier returns as scheduled.  He feels well.  Good appetite.  No palpable lymph nodes.  He is planning a trip to [place] next week and another trip to [place] in September.  He continues Coumadin anticoagulation.  Objective:  Vital signs in last 24 hours:  Blood pressure 113/72, pulse 83, temperature 98.6 F (37 C), temperature source Oral, resp. rate 18, height '5\' 11"'  (1.803 m), weight 202 lb 12.8 oz (92 kg), SpO2 98 %.    Lymphatics: No cervical, supraclavicular, axillary, or inguinal nodes Resp: Lungs clear bilaterally Cardio: Regular rate and rhythm GI: No hepatosplenomegaly, mild tenderness in the subxiphoid region with slight fullness (muscular?),  No discrete mass Vascular: No leg edema  Lab Results:  Lab Results  Component Value Date   WBC 7.1 07/15/2021   HGB 13.9 07/15/2021   HCT 41.2 07/15/2021   MCV 94.7 07/15/2021   PLT 125 (L) 07/15/2021   NEUTROABS 4.3 07/15/2021    CMP  Lab Results  Component Value Date   NA 141 02/25/2021   K 4.7 02/25/2021   CL 102 02/25/2021   CO2 29 02/25/2021   GLUCOSE 76 02/25/2021   BUN 31 (H) 02/25/2021   CREATININE 1.61 (H) 02/25/2021   CALCIUM 9.0 02/25/2021   PROT 7.4 02/25/2021   ALBUMIN 4.1 02/25/2021   AST 18 02/25/2021   ALT 16 02/25/2021   ALKPHOS 44 02/25/2021   BILITOT 0.5 02/25/2021   GFRNONAA 44 (L) 02/25/2021   GFRAA 52 (L) 08/13/2020     Medications: I have reviewed the patient's current medications.   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 He will return for a monthly PT check.  Indwelling IVC catheter.  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        4. 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 erectile dysfunction-followed by Dr. Jeffie Pollock , status post  placement of a penile implant at [facility] in April 2014            7. CT of the abdomen/pelvis at [facility] urology February 2014 for evaluation of hematuria-12 mm external iliac nodes and left periaortic retroperitoneal lymph node-11 mm-potentially related to CLL      8.  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, peripheral lymphocytosis and small palpable lymph nodes          CT abdomen/pelvis 09/18/2019-extensive abdominal pelvic lymphadenopathy including a left periaortic node measuring 5.7 cm 08/13/2020 CLL FISH panel-no evidence of trisomy 12; no evidence of D13S319; no evidence of p53 deletion or amplification; deletion of ATM present 11/05/2020-peripheral blood TP53 mutation-negative Cycle 1 bendamustine/Rituxan 10/08/2020, 10/09/2020 Cycle 2 bendamustine/Rituxan 11/05/2020, 11/06/2020 Cycle 3 bendamustine/Rituxan 12/03/2020, 12/04/2020 Cycle 4 bendamustine/Rituxan 12/31/2020, 01/01/2021 CTs 01/19/2021-decrease in bulky abdomen/pelvic lymphadenopathy with more widespread ill-defined "haziness "in the mesentery and retroperitoneal fat, decreased pelvic lymphadenopathy Cycle 5 bendamustine/Rituxan 01/28/2021, 01/29/2021 Cycle 6 bendamustine/rituximab 02/25/2021, 02/26/2021     9.  Basal cell carcinoma removed from the left superior central forehead 01/21/2020    10.  Renal insufficiency    Disposition: Chad. 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 The PT/INR is therapeutic.  He will continue Coumadin anticoagulation.  He will return for a monthly  lab visit.  We will arrange for a second dose of Evusheld within the next few months.  He will return for an office visit in 4 months. Betsy Coder, MD  07/15/2021  11:45 AM

## 2021-07-16 ENCOUNTER — Other Ambulatory Visit: Payer: Self-pay | Admitting: Family Medicine

## 2021-07-17 ENCOUNTER — Other Ambulatory Visit: Payer: Self-pay | Admitting: Nurse Practitioner

## 2021-07-17 DIAGNOSIS — C9110 Chronic lymphocytic leukemia of B-cell type not having achieved remission: Secondary | ICD-10-CM

## 2021-08-08 ENCOUNTER — Encounter: Payer: Self-pay | Admitting: Oncology

## 2021-08-11 ENCOUNTER — Other Ambulatory Visit: Payer: Self-pay | Admitting: Nurse Practitioner

## 2021-08-11 DIAGNOSIS — C9110 Chronic lymphocytic leukemia of B-cell type not having achieved remission: Secondary | ICD-10-CM

## 2021-08-17 ENCOUNTER — Inpatient Hospital Stay: Payer: MEDICARE

## 2021-08-17 ENCOUNTER — Other Ambulatory Visit: Payer: Self-pay

## 2021-08-17 ENCOUNTER — Telehealth: Payer: Self-pay

## 2021-08-17 ENCOUNTER — Inpatient Hospital Stay: Payer: MEDICARE | Attending: Oncology

## 2021-08-17 DIAGNOSIS — C9110 Chronic lymphocytic leukemia of B-cell type not having achieved remission: Secondary | ICD-10-CM | POA: Insufficient documentation

## 2021-08-17 DIAGNOSIS — Z298 Encounter for other specified prophylactic measures: Secondary | ICD-10-CM | POA: Diagnosis present

## 2021-08-17 DIAGNOSIS — Z9221 Personal history of antineoplastic chemotherapy: Secondary | ICD-10-CM | POA: Diagnosis not present

## 2021-08-17 DIAGNOSIS — Z86718 Personal history of other venous thrombosis and embolism: Secondary | ICD-10-CM | POA: Diagnosis not present

## 2021-08-17 DIAGNOSIS — Z7901 Long term (current) use of anticoagulants: Secondary | ICD-10-CM

## 2021-08-17 DIAGNOSIS — D696 Thrombocytopenia, unspecified: Secondary | ICD-10-CM | POA: Insufficient documentation

## 2021-08-17 LAB — CBC WITH DIFFERENTIAL (CANCER CENTER ONLY)
Abs Immature Granulocytes: 0.03 10*3/uL (ref 0.00–0.07)
Basophils Absolute: 0 10*3/uL (ref 0.0–0.1)
Basophils Relative: 1 %
Eosinophils Absolute: 0.4 10*3/uL (ref 0.0–0.5)
Eosinophils Relative: 6 %
HCT: 41.3 % (ref 39.0–52.0)
Hemoglobin: 13.8 g/dL (ref 13.0–17.0)
Immature Granulocytes: 0 %
Lymphocytes Relative: 16 %
Lymphs Abs: 1.1 10*3/uL (ref 0.7–4.0)
MCH: 31.2 pg (ref 26.0–34.0)
MCHC: 33.4 g/dL (ref 30.0–36.0)
MCV: 93.2 fL (ref 80.0–100.0)
Monocytes Absolute: 0.9 10*3/uL (ref 0.1–1.0)
Monocytes Relative: 13 %
Neutro Abs: 4.7 10*3/uL (ref 1.7–7.7)
Neutrophils Relative %: 64 %
Platelet Count: 123 10*3/uL — ABNORMAL LOW (ref 150–400)
RBC: 4.43 MIL/uL (ref 4.22–5.81)
RDW: 12.8 % (ref 11.5–15.5)
WBC Count: 7.2 10*3/uL (ref 4.0–10.5)
nRBC: 0 % (ref 0.0–0.2)

## 2021-08-17 LAB — PROTIME-INR
INR: 2.3 — ABNORMAL HIGH (ref 0.8–1.2)
Prothrombin Time: 25.5 seconds — ABNORMAL HIGH (ref 11.4–15.2)

## 2021-08-17 MED ORDER — CILGAVIMAB (PART OF EVUSHELD) INJECTION
300.0000 mg | Freq: Once | INTRAMUSCULAR | Status: AC
  Administered 2021-08-17: 300 mg via INTRAMUSCULAR

## 2021-08-17 MED ORDER — TIXAGEVIMAB (PART OF EVUSHELD) INJECTION
300.0000 mg | Freq: Once | INTRAMUSCULAR | Status: AC
  Administered 2021-08-17: 300 mg via INTRAMUSCULAR

## 2021-08-17 NOTE — Progress Notes (Signed)
Patient presents today for Evusheld injection, observed for 30 minutes post injection without any concerns.

## 2021-08-17 NOTE — Patient Instructions (Signed)
Tixagevimab; Cilgavimab Solutions for Injection What is this medication? TIXAGEVIMAB; CILGAVIMAB (tix a jev i mab; sil gav i mab) reduces the risk of getting COVID-19 in persons with immune system problems who may not respond properly to the COVID-19 vaccine or persons who can't receive the COVID-19 vaccine. It belongs to a group of medications called monoclonal antibodies. It may decrease the risk of developing severe symptoms of COVID-19. It may also decrease the chance of going to the hospital. This medication is not approved by the FDA. The FDA has authorized emergency use of this medication during the COVID-19 pandemic. This medicine may be used for other purposes; ask your health care provider or pharmacist if you have questions. COMMON BRAND NAME(S): EVUSHELD What should I tell my care team before I take this medication? They need to know if you have any of these conditions: any allergies any serious illness bleeding disorder have received a COVID-19 vaccine heart disease an unusual or allergic reaction to tixagevimab, cilgavimab, other medications, foods, dyes, or preservatives pregnant or trying to get pregnant breast-feeding How should I use this medication? This medication is injected into a muscle. It is given by your care team in a hospital or clinic setting. Talk to your care team about the use of this medication in children. While it may be given to children as young as 12 years, precautions do apply. Overdosage: If you think you have taken too much of this medicine contact a poison control center or emergency room at once. NOTE: This medicine is only for you. Do not share this medicine with others. What if I miss a dose? Keep appointments for follow-up doses. It is important not to miss your dose. Call your care team if you are unable to keep an appointment. What may interact with this medication? COVID-19 vaccines This list may not describe all possible interactions. Give  your health care provider a list of all the medicines, herbs, non-prescription drugs, or dietary supplements you use. Also tell them if you smoke, drink alcohol, or use illegal drugs. Some items may interact with your medicine. What should I watch for while using this medication? Your condition will be monitored carefully while you are receiving this medication. Visit your care team for regular checks on your progress. Tell your care team if your symptoms do not start to get better or if they get worse. After receiving this medication, wait at least 14 days before getting the COVID-19 vaccine. What side effects may I notice from receiving this medication? Side effects that you should report to your care team as soon as possible: Allergic reactions-skin rash, itching, hives, swelling of the face, lips, tongue, or throat Heart attack-pain or tightness in the chest, shoulders, arms, or jaw, nausea, shortness of breath, cold or clammy skin, feeling faint or lightheaded Heart failure-shortness of breath, swelling of the ankles, feet, or hands, sudden weight gain, unusual weakness or fatigue Heart rhythm changes-fast or irregular heartbeat, dizziness, feeling faint or lightheaded, chest pain, trouble breathing Side effects that usually do not require medical attention (report these to your care team if they continue or are bothersome): Cough Fatigue Headache Pain, redness, or irritation at site where injected This list may not describe all possible side effects. Call your doctor for medical advice about side effects. You may report side effects to FDA at 1-800-FDA-1088. Where should I keep my medication? This medication is given in a hospital or clinic. It will not be stored at home. NOTE: This sheet is  a summary. It may not cover all possible information. If you have questions about this medicine, talk to your doctor, pharmacist, or health care provider.  2022 Elsevier/Gold Standard (2020-11-06  16:22:16)

## 2021-08-17 NOTE — Telephone Encounter (Signed)
-----   Message from Ladell Pier, MD sent at 08/17/2021 12:12 PM EDT ----- Please call patient, continue Coumadin same dose, follow-up as scheduled

## 2021-08-29 ENCOUNTER — Encounter: Payer: Self-pay | Admitting: Oncology

## 2021-09-07 ENCOUNTER — Encounter: Payer: Self-pay | Admitting: Oncology

## 2021-09-08 ENCOUNTER — Encounter: Payer: Self-pay | Admitting: Oncology

## 2021-09-08 ENCOUNTER — Ambulatory Visit (INDEPENDENT_AMBULATORY_CARE_PROVIDER_SITE_OTHER): Payer: MEDICARE | Admitting: Family Medicine

## 2021-09-08 ENCOUNTER — Other Ambulatory Visit: Payer: Self-pay

## 2021-09-08 ENCOUNTER — Encounter (HOSPITAL_BASED_OUTPATIENT_CLINIC_OR_DEPARTMENT_OTHER): Payer: Self-pay | Admitting: Family Medicine

## 2021-09-08 DIAGNOSIS — U071 COVID-19: Secondary | ICD-10-CM | POA: Diagnosis not present

## 2021-09-08 MED ORDER — BENZONATATE 100 MG PO CAPS: 100.0000 mg | ORAL_CAPSULE | Freq: Three times a day (TID) | ORAL | 0 refills | Status: DC | PRN

## 2021-09-08 NOTE — Patient Instructions (Signed)
  Medication Instructions:  Your physician has recommended you make the following change in your medication:  -- START Tessalon Pearls - Take 1 tablet (100 mg) by mouth three times daily as needed --If you need a refill on any your medications before your next appointment, please call your pharmacy first. If no refills are authorized on file call the office.-- Follow-Up: Your next appointment:   Your physician recommends that you keep your scheduled follow up on 09/24/21 with Dr. de [place]  You will receive a text message or e-mail with a link to a survey about your care and experience with [place] today! We would greatly appreciate your feedback!   Thanks for letting us be apart of your health journey!!  Primary Care and Sports Medicine   Dr. Arlina Robes [place]   We encourage you to activate your patient portal called "MyChart".  Sign up information is provided on this After Visit Summary.  MyChart is used to connect with patients for Virtual Visits (Telemedicine).  Patients are able to view lab/test results, encounter notes, upcoming appointments, etc.  Non-urgent messages can be sent to your provider as well. To learn more about what you can do with MyChart, please visit --  [email or web address].

## 2021-09-09 NOTE — Progress Notes (Signed)
    Procedures performed today:    None.  Independent interpretation of notes and tests performed by another provider:   None.  Brief History, Exam, Impression, and Recommendations:    BP (!) 146/74   Pulse 94   Ht 5\' 11"  (1.803 m)   Wt 200 lb 6.4 oz (90.9 kg)   SpO2 98%   BMI 27.95 kg/m   COVID-19 virus infection Personal symptoms on September 30 and subsequently tested positive on October 1.  Patient is traveling anticandida during this.  Primary symptoms have been cough, congestion, mild shortness of breath.  T-max during illness has been 101 degrees, this was during the beginning of illness.  Did not receive any specific treatment while in [place] due to his history of CLL, was isolating while there.  He still currently has cough, some shortness of breath with doing activities around the house.  Has been treating symptomatically.  Has questions today regarding current symptoms, treatment, need for isolating. On exam, lungs clear to auscultation bilaterally, mild wheeze on right side, no suggestion of consolidation within the lungs.  Cardiovascular exam with regular rate and rhythm.  Patient appears nontoxic.  Adequate oxygenation in clinic today. Discussed primary concerns with patient, particularly due to breathing status.  At present he appears stable, advised that should any further worsening occur, notably with increased shortness of breath, difficulty with breathing with minimal activity or at rest, recommend presenting to the emergency department for further evaluation management of shortness of breath Recommend that he can continue treating symptoms conservatively with OTC measures.  We will also prescribe Tessalon Perles to be utilized to help with controlling cough. Discussed guidelines according to CDC related to isolation/quarantining.  Patient has received the coronavirus vaccine, booster.  Generally recommend avoiding unnecessary social interaction as able until symptoms  improve. Plan for follow-up as needed   ___________________________________________ Khaleelah Yowell de [place], MD, ABFM, [facility] Primary Care and [place]

## 2021-09-10 DIAGNOSIS — U071 COVID-19: Secondary | ICD-10-CM | POA: Insufficient documentation

## 2021-09-10 NOTE — Assessment & Plan Note (Signed)
Personal symptoms on September 30 and subsequently tested positive on October 1.  Patient is traveling anticandida during this.  Primary symptoms have been cough, congestion, mild shortness of breath.  T-max during illness has been 101 degrees, this was during the beginning of illness.  Did not receive any specific treatment while in [place] due to his history of CLL, was isolating while there.  He still currently has cough, some shortness of breath with doing activities around the house.  Has been treating symptomatically.  Has questions today regarding current symptoms, treatment, need for isolating. On exam, lungs clear to auscultation bilaterally, mild wheeze on right side, no suggestion of consolidation within the lungs.  Cardiovascular exam with regular rate and rhythm.  Patient appears nontoxic.  Adequate oxygenation in clinic today. Discussed primary concerns with patient, particularly due to breathing status.  At present he appears stable, advised that should any further worsening occur, notably with increased shortness of breath, difficulty with breathing with minimal activity or at rest, recommend presenting to the emergency department for further evaluation management of shortness of breath Recommend that he can continue treating symptoms conservatively with OTC measures.  We will also prescribe Tessalon Perles to be utilized to help with controlling cough. Discussed guidelines according to CDC related to isolation/quarantining.  Patient has received the coronavirus vaccine, booster.  Generally recommend avoiding unnecessary social interaction as able until symptoms improve. Plan for follow-up as needed

## 2021-09-16 ENCOUNTER — Inpatient Hospital Stay: Payer: MEDICARE

## 2021-09-23 ENCOUNTER — Encounter (HOSPITAL_BASED_OUTPATIENT_CLINIC_OR_DEPARTMENT_OTHER): Payer: Self-pay | Admitting: Family Medicine

## 2021-09-24 ENCOUNTER — Ambulatory Visit (INDEPENDENT_AMBULATORY_CARE_PROVIDER_SITE_OTHER): Payer: MEDICARE | Admitting: Family Medicine

## 2021-09-24 ENCOUNTER — Other Ambulatory Visit: Payer: Self-pay

## 2021-09-24 ENCOUNTER — Encounter (HOSPITAL_BASED_OUTPATIENT_CLINIC_OR_DEPARTMENT_OTHER): Payer: Self-pay | Admitting: Family Medicine

## 2021-09-24 VITALS — BP 120/68 | HR 83 | Ht 71.0 in | Wt 202.0 lb

## 2021-09-24 DIAGNOSIS — D696 Thrombocytopenia, unspecified: Secondary | ICD-10-CM | POA: Diagnosis not present

## 2021-09-24 DIAGNOSIS — Z23 Encounter for immunization: Secondary | ICD-10-CM | POA: Diagnosis not present

## 2021-09-24 DIAGNOSIS — Z7901 Long term (current) use of anticoagulants: Secondary | ICD-10-CM | POA: Diagnosis not present

## 2021-09-24 DIAGNOSIS — U071 COVID-19: Secondary | ICD-10-CM | POA: Diagnosis not present

## 2021-09-24 NOTE — Progress Notes (Signed)
    Procedures performed today:    None.  Independent interpretation of notes and tests performed by another provider:   None.  Brief History, Exam, Impression, and Recommendations:    BP 120/68   Pulse 83   Ht 5\' 11"  (1.803 m)   Wt 202 lb (91.6 kg)   SpO2 99%   BMI 28.17 kg/m   Thrombocytopenia, unspecified (HCC) Patient was due for repeat CBC with hematologist earlier this month, however had to cancel that appointment due to coronavirus illness Will check CBC today and copy results to hematologist Recommend patient continue with regularly scheduled follow-up with hematologist upcoming next month  COVID-19 virus infection Patient reports feeling much improved from when he was evaluated previously Denies any current issues related to shortness of breath, cough, feels mostly back to baseline Recommend continuing with expectant management  Long term current use of anticoagulant Patient is managed on warfarin, due to recent illness he had to cancel his visit for labs with hematology office We will check INR today and copy results to hematologist Continue with previously scheduled appointments with hematology for labs/follow-up  Plan for follow-up in about 4 to 6 months or sooner as needed   ___________________________________________ Dorothea Yow de [place], MD, ABFM, [facility] Primary Care and [place]

## 2021-09-24 NOTE — Patient Instructions (Signed)
  Medication Instructions:  Your physician recommends that you continue on your current medications as directed. Please refer to the Current Medication list given to you today. --If you need a refill on any your medications before your next appointment, please call your pharmacy first. If no refills are authorized on file call the office.-- Lab Work: Your physician has recommended that you have lab work today: CBC and INR If you have labs (blood work) drawn today and your tests are completely normal, you will receive your results via [place] a phone call from our staff.  Please ensure you check your voicemail in the event that you authorized detailed messages to be left on a delegated number. If you have any lab test that is abnormal or we need to change your treatment, we will call you to review the results.  Follow-Up: Your next appointment:   Your physician recommends that you schedule a follow-up appointment in: 4-6 MONTHS with Dr. de [place]  You will receive a text message or e-mail with a link to a survey about your care and experience with [place] today! We would greatly appreciate your feedback!   Thanks for letting us be apart of your health journey!!  Primary Care and Sports Medicine   Dr. Arlina Robes [place]   We encourage you to activate your patient portal called "MyChart".  Sign up information is provided on this After Visit Summary.  MyChart is used to connect with patients for Virtual Visits (Telemedicine).  Patients are able to view lab/test results, encounter notes, upcoming appointments, etc.  Non-urgent messages can be sent to your provider as well. To learn more about what you can do with MyChart, please visit --  [email or web address].

## 2021-09-24 NOTE — Assessment & Plan Note (Signed)
Patient is managed on warfarin, due to recent illness he had to cancel his visit for labs with hematology office We will check INR today and copy results to hematologist Continue with previously scheduled appointments with hematology for labs/follow-up

## 2021-09-24 NOTE — Assessment & Plan Note (Signed)
Patient reports feeling much improved from when he was evaluated previously Denies any current issues related to shortness of breath, cough, feels mostly back to baseline Recommend continuing with expectant management

## 2021-09-24 NOTE — Assessment & Plan Note (Signed)
Patient was due for repeat CBC with hematologist earlier this month, however had to cancel that appointment due to coronavirus illness Will check CBC today and copy results to hematologist Recommend patient continue with regularly scheduled follow-up with hematologist upcoming next month

## 2021-09-25 ENCOUNTER — Encounter: Payer: Self-pay | Admitting: Oncology

## 2021-09-25 LAB — CBC WITH DIFFERENTIAL/PLATELET
Basophils Absolute: 0 10*3/uL (ref 0.0–0.2)
Basos: 1 %
EOS (ABSOLUTE): 0.4 10*3/uL (ref 0.0–0.4)
Eos: 5 %
Hematocrit: 41.2 % (ref 37.5–51.0)
Hemoglobin: 13.7 g/dL (ref 13.0–17.7)
Immature Grans (Abs): 0.1 10*3/uL (ref 0.0–0.1)
Immature Granulocytes: 1 %
Lymphocytes Absolute: 1.2 10*3/uL (ref 0.7–3.1)
Lymphs: 17 %
MCH: 31.3 pg (ref 26.6–33.0)
MCHC: 33.3 g/dL (ref 31.5–35.7)
MCV: 94 fL (ref 79–97)
Monocytes Absolute: 1.1 10*3/uL — ABNORMAL HIGH (ref 0.1–0.9)
Monocytes: 15 %
Neutrophils Absolute: 4.4 10*3/uL (ref 1.4–7.0)
Neutrophils: 61 %
Platelets: 155 10*3/uL (ref 150–450)
RBC: 4.38 x10E6/uL (ref 4.14–5.80)
RDW: 12.9 % (ref 11.6–15.4)
WBC: 7.2 10*3/uL (ref 3.4–10.8)

## 2021-09-25 LAB — PROTIME-INR
INR: 4.4 — ABNORMAL HIGH (ref 0.9–1.2)
Prothrombin Time: 42.9 s — ABNORMAL HIGH (ref 9.1–12.0)

## 2021-10-01 ENCOUNTER — Inpatient Hospital Stay: Payer: MEDICARE | Attending: Oncology

## 2021-10-01 ENCOUNTER — Encounter: Payer: Self-pay | Admitting: Oncology

## 2021-10-01 ENCOUNTER — Telehealth: Payer: Self-pay

## 2021-10-01 ENCOUNTER — Other Ambulatory Visit: Payer: Self-pay

## 2021-10-01 DIAGNOSIS — Z7901 Long term (current) use of anticoagulants: Secondary | ICD-10-CM | POA: Insufficient documentation

## 2021-10-01 DIAGNOSIS — C9110 Chronic lymphocytic leukemia of B-cell type not having achieved remission: Secondary | ICD-10-CM | POA: Diagnosis present

## 2021-10-01 DIAGNOSIS — Z298 Encounter for other specified prophylactic measures: Secondary | ICD-10-CM | POA: Diagnosis present

## 2021-10-01 LAB — CBC WITH DIFFERENTIAL (CANCER CENTER ONLY)
Abs Immature Granulocytes: 0.06 10*3/uL (ref 0.00–0.07)
Basophils Absolute: 0 10*3/uL (ref 0.0–0.1)
Basophils Relative: 1 %
Eosinophils Absolute: 0.5 10*3/uL (ref 0.0–0.5)
Eosinophils Relative: 6 %
HCT: 41.4 % (ref 39.0–52.0)
Hemoglobin: 13.7 g/dL (ref 13.0–17.0)
Immature Granulocytes: 1 %
Lymphocytes Relative: 17 %
Lymphs Abs: 1.4 10*3/uL (ref 0.7–4.0)
MCH: 30.8 pg (ref 26.0–34.0)
MCHC: 33.1 g/dL (ref 30.0–36.0)
MCV: 93 fL (ref 80.0–100.0)
Monocytes Absolute: 1.1 10*3/uL — ABNORMAL HIGH (ref 0.1–1.0)
Monocytes Relative: 13 %
Neutro Abs: 5.4 10*3/uL (ref 1.7–7.7)
Neutrophils Relative %: 62 %
Platelet Count: 145 10*3/uL — ABNORMAL LOW (ref 150–400)
RBC: 4.45 MIL/uL (ref 4.22–5.81)
RDW: 13.4 % (ref 11.5–15.5)
WBC Count: 8.6 10*3/uL (ref 4.0–10.5)
nRBC: 0 % (ref 0.0–0.2)

## 2021-10-01 LAB — PROTIME-INR
INR: 2 — ABNORMAL HIGH (ref 0.8–1.2)
Prothrombin Time: 22.8 seconds — ABNORMAL HIGH (ref 11.4–15.2)

## 2021-10-01 NOTE — Telephone Encounter (Signed)
-----   Message from Ladell Pier, MD sent at 10/01/2021  2:47 PM EDT ----- Please call patient, continue Coumadin same dose, repeat PT/INR in 2 weeks

## 2021-10-01 NOTE — Telephone Encounter (Signed)
V/M message left relaying message.

## 2021-10-16 ENCOUNTER — Telehealth: Payer: Self-pay

## 2021-10-16 ENCOUNTER — Other Ambulatory Visit: Payer: Self-pay

## 2021-10-16 ENCOUNTER — Inpatient Hospital Stay: Payer: MEDICARE

## 2021-10-16 DIAGNOSIS — C9110 Chronic lymphocytic leukemia of B-cell type not having achieved remission: Secondary | ICD-10-CM

## 2021-10-16 DIAGNOSIS — Z7901 Long term (current) use of anticoagulants: Secondary | ICD-10-CM

## 2021-10-16 LAB — CBC WITH DIFFERENTIAL (CANCER CENTER ONLY)
Abs Immature Granulocytes: 0.03 10*3/uL (ref 0.00–0.07)
Basophils Absolute: 0 10*3/uL (ref 0.0–0.1)
Basophils Relative: 0 %
Eosinophils Absolute: 0.4 10*3/uL (ref 0.0–0.5)
Eosinophils Relative: 8 %
HCT: 40.1 % (ref 39.0–52.0)
Hemoglobin: 13.6 g/dL (ref 13.0–17.0)
Immature Granulocytes: 1 %
Lymphocytes Relative: 23 %
Lymphs Abs: 1.1 10*3/uL (ref 0.7–4.0)
MCH: 31.4 pg (ref 26.0–34.0)
MCHC: 33.9 g/dL (ref 30.0–36.0)
MCV: 92.6 fL (ref 80.0–100.0)
Monocytes Absolute: 0.5 10*3/uL (ref 0.1–1.0)
Monocytes Relative: 10 %
Neutro Abs: 2.7 10*3/uL (ref 1.7–7.7)
Neutrophils Relative %: 58 %
Platelet Count: 98 10*3/uL — ABNORMAL LOW (ref 150–400)
RBC: 4.33 MIL/uL (ref 4.22–5.81)
RDW: 13.9 % (ref 11.5–15.5)
WBC Count: 4.7 10*3/uL (ref 4.0–10.5)
nRBC: 0 % (ref 0.0–0.2)

## 2021-10-16 LAB — PROTIME-INR
INR: 3 — ABNORMAL HIGH (ref 0.8–1.2)
Prothrombin Time: 31 seconds — ABNORMAL HIGH (ref 11.4–15.2)

## 2021-10-16 NOTE — Telephone Encounter (Signed)
-----   Message from Ladell Pier, MD sent at 10/16/2021  3:39 PM EST ----- Please call patient, continue Coumadin, same dose, platelets slightly lower, call for bleeding, repeat CBC and PT/INR in 2 weeks

## 2021-10-16 NOTE — Telephone Encounter (Signed)
Pt verbalized understanding. Schedule message left to schedule lab.

## 2021-10-26 ENCOUNTER — Encounter: Payer: Self-pay | Admitting: Oncology

## 2021-10-30 ENCOUNTER — Other Ambulatory Visit: Payer: Self-pay

## 2021-10-30 ENCOUNTER — Inpatient Hospital Stay: Payer: MEDICARE | Attending: Oncology

## 2021-10-30 ENCOUNTER — Telehealth: Payer: Self-pay

## 2021-10-30 DIAGNOSIS — N529 Male erectile dysfunction, unspecified: Secondary | ICD-10-CM | POA: Diagnosis not present

## 2021-10-30 DIAGNOSIS — C9110 Chronic lymphocytic leukemia of B-cell type not having achieved remission: Secondary | ICD-10-CM | POA: Insufficient documentation

## 2021-10-30 DIAGNOSIS — D696 Thrombocytopenia, unspecified: Secondary | ICD-10-CM | POA: Insufficient documentation

## 2021-10-30 DIAGNOSIS — Z85828 Personal history of other malignant neoplasm of skin: Secondary | ICD-10-CM | POA: Diagnosis not present

## 2021-10-30 DIAGNOSIS — Z7901 Long term (current) use of anticoagulants: Secondary | ICD-10-CM

## 2021-10-30 LAB — PROTIME-INR
INR: 4 — ABNORMAL HIGH (ref 0.8–1.2)
Prothrombin Time: 39 seconds — ABNORMAL HIGH (ref 11.4–15.2)

## 2021-10-30 LAB — CBC WITH DIFFERENTIAL (CANCER CENTER ONLY)
Abs Immature Granulocytes: 0.08 10*3/uL — ABNORMAL HIGH (ref 0.00–0.07)
Basophils Absolute: 0 10*3/uL (ref 0.0–0.1)
Basophils Relative: 0 %
Eosinophils Absolute: 0.3 10*3/uL (ref 0.0–0.5)
Eosinophils Relative: 5 %
HCT: 40.7 % (ref 39.0–52.0)
Hemoglobin: 13.5 g/dL (ref 13.0–17.0)
Immature Granulocytes: 1 %
Lymphocytes Relative: 18 %
Lymphs Abs: 1.3 10*3/uL (ref 0.7–4.0)
MCH: 31 pg (ref 26.0–34.0)
MCHC: 33.2 g/dL (ref 30.0–36.0)
MCV: 93.6 fL (ref 80.0–100.0)
Monocytes Absolute: 0.8 10*3/uL (ref 0.1–1.0)
Monocytes Relative: 11 %
Neutro Abs: 4.5 10*3/uL (ref 1.7–7.7)
Neutrophils Relative %: 65 %
Platelet Count: 156 10*3/uL (ref 150–400)
RBC: 4.35 MIL/uL (ref 4.22–5.81)
RDW: 13.8 % (ref 11.5–15.5)
WBC Count: 6.9 10*3/uL (ref 4.0–10.5)
nRBC: 0 % (ref 0.0–0.2)

## 2021-10-30 NOTE — Telephone Encounter (Signed)
-----   Message from Ladell Pier, MD sent at 10/30/2021  4:55 PM EST ----- Please call patient, CBC looks good, PT/INR is high, hold Coumadin 12-22, 10/31/2021, resume at a dose of 10 mg daily on 11/01/2021, return for PT/INR 11/02/2021

## 2021-10-30 NOTE — Telephone Encounter (Signed)
Pt verbalized understanding. Read back of holding coumadin from 10/30/21- 10/31/21 and restarting coumadin 10 mg on 11/01/21. Lab scheduled for Monday.

## 2021-10-31 ENCOUNTER — Other Ambulatory Visit (HOSPITAL_COMMUNITY): Payer: Self-pay

## 2021-11-02 ENCOUNTER — Other Ambulatory Visit: Payer: Self-pay

## 2021-11-02 ENCOUNTER — Telehealth: Payer: Self-pay

## 2021-11-02 ENCOUNTER — Inpatient Hospital Stay: Payer: MEDICARE

## 2021-11-02 DIAGNOSIS — C9110 Chronic lymphocytic leukemia of B-cell type not having achieved remission: Secondary | ICD-10-CM | POA: Diagnosis not present

## 2021-11-02 LAB — PROTIME-INR
INR: 1.8 — ABNORMAL HIGH (ref 0.8–1.2)
Prothrombin Time: 20.4 seconds — ABNORMAL HIGH (ref 11.4–15.2)

## 2021-11-02 NOTE — Telephone Encounter (Signed)
Ladell Pier, MD  P [facility] Clinical Please call patient, continue Coumadin at a dose of 10 mg daily, repeat PT/INR in 2 weeks  Pt verbalized understanding to restart Coumadin at a dose of 10 mg daily

## 2021-11-09 ENCOUNTER — Encounter: Payer: Self-pay | Admitting: Oncology

## 2021-11-09 ENCOUNTER — Other Ambulatory Visit: Payer: Self-pay

## 2021-11-09 DIAGNOSIS — C9110 Chronic lymphocytic leukemia of B-cell type not having achieved remission: Secondary | ICD-10-CM

## 2021-11-10 ENCOUNTER — Encounter: Payer: Self-pay | Admitting: Oncology

## 2021-11-11 ENCOUNTER — Telehealth: Payer: Self-pay

## 2021-11-11 NOTE — Telephone Encounter (Signed)
Return My chart message form Pt. inquiring about getting a tooth extracted and holding coumadin before his tooth  is extracted Pt's dentist would like to have Dr Gearldine Shown input before extracting his tooth. Message sent to Dr Benay Spice to respond.

## 2021-11-12 ENCOUNTER — Telehealth: Payer: Self-pay

## 2021-11-12 NOTE — Telephone Encounter (Signed)
-----   Message from Ladell Pier, MD sent at 11/11/2021  6:42 PM EST ----- Regarding: RE: Tooth extraction Okay, hold for 3 days prior to procedure, restart Coumadin the night of procedure, check PT/INR day of procedure-prior to the procedure ----- Message ----- From: Kelli Hope, LPN Sent: 11/73/5670   4:21 PM EST To: Ladell Pier, MD Subject: Tooth extraction                               Dr Benay Spice, Mr Goldsmith's dentist would like to extract a tooth and would like to know if you are ok with him stopping his coumadin and for how long to do the procedure. ?

## 2021-11-12 NOTE — Telephone Encounter (Signed)
Pt left a voicemail informing him of the directions of holding coumadin before and after his tooth extraction. And when to come in for PT/INR

## 2021-11-16 ENCOUNTER — Other Ambulatory Visit: Payer: Self-pay

## 2021-11-16 ENCOUNTER — Inpatient Hospital Stay (HOSPITAL_BASED_OUTPATIENT_CLINIC_OR_DEPARTMENT_OTHER): Payer: MEDICARE | Admitting: Oncology

## 2021-11-16 ENCOUNTER — Encounter: Payer: Self-pay | Admitting: Oncology

## 2021-11-16 ENCOUNTER — Inpatient Hospital Stay: Payer: MEDICARE

## 2021-11-16 VITALS — BP 139/86 | HR 79 | Temp 97.8°F | Resp 18 | Ht 71.0 in | Wt 201.0 lb

## 2021-11-16 DIAGNOSIS — C9110 Chronic lymphocytic leukemia of B-cell type not having achieved remission: Secondary | ICD-10-CM

## 2021-11-16 DIAGNOSIS — Z7901 Long term (current) use of anticoagulants: Secondary | ICD-10-CM

## 2021-11-16 LAB — CBC WITH DIFFERENTIAL (CANCER CENTER ONLY)
Abs Immature Granulocytes: 0.03 10*3/uL (ref 0.00–0.07)
Basophils Absolute: 0 10*3/uL (ref 0.0–0.1)
Basophils Relative: 1 %
Eosinophils Absolute: 0.5 10*3/uL (ref 0.0–0.5)
Eosinophils Relative: 8 %
HCT: 40.2 % (ref 39.0–52.0)
Hemoglobin: 13.5 g/dL (ref 13.0–17.0)
Immature Granulocytes: 1 %
Lymphocytes Relative: 20 %
Lymphs Abs: 1.3 10*3/uL (ref 0.7–4.0)
MCH: 31.3 pg (ref 26.0–34.0)
MCHC: 33.6 g/dL (ref 30.0–36.0)
MCV: 93.1 fL (ref 80.0–100.0)
Monocytes Absolute: 0.8 10*3/uL (ref 0.1–1.0)
Monocytes Relative: 14 %
Neutro Abs: 3.6 10*3/uL (ref 1.7–7.7)
Neutrophils Relative %: 56 %
Platelet Count: 132 10*3/uL — ABNORMAL LOW (ref 150–400)
RBC: 4.32 MIL/uL (ref 4.22–5.81)
RDW: 13.6 % (ref 11.5–15.5)
WBC Count: 6.2 10*3/uL (ref 4.0–10.5)
nRBC: 0 % (ref 0.0–0.2)

## 2021-11-16 LAB — PROTIME-INR
INR: 2.1 — ABNORMAL HIGH (ref 0.8–1.2)
Prothrombin Time: 24 seconds — ABNORMAL HIGH (ref 11.4–15.2)

## 2021-11-16 NOTE — Addendum Note (Signed)
Addended by: Velna Hatchet on: 11/16/2021 03:51 PM   Modules accepted: Orders

## 2021-11-16 NOTE — Progress Notes (Signed)
[place] OFFICE PROGRESS NOTE   Diagnosis: CLL, chronic anticoagulation therapy  INTERVAL HISTORY:   Mr. Hamad returns as scheduled.  He has symptoms of an upper respiratory infection today with a cough and nasal congestion.  No other symptoms.  He had COVID-19 while in [place] in October.  He continues Coumadin anticoagulation.  He plans to have a tooth pulled within the next few weeks.  Objective:  Vital signs in last 24 hours:  Blood pressure 139/86, pulse 79, temperature 97.8 F (36.6 C), temperature source Oral, resp. rate 18, height '5\' 11"'  (1.803 m), weight 201 lb (91.2 kg), SpO2 100 %.    Lymphatics: No cervical, supraclavicular, axillary, or inguinal nodes Resp: Lungs clear bilaterally, no respiratory distress Cardio: Regular rate and rhythm GI: No hepatosplenomegaly, no mass, nontender Vascular: No leg edema  Skin: The right lower leg is larger than the left side  Portacath/PICC-without erythema  Lab Results:  Lab Results  Component Value Date   WBC 6.2 11/16/2021   HGB 13.5 11/16/2021   HCT 40.2 11/16/2021   MCV 93.1 11/16/2021   PLT 132 (L) 11/16/2021   NEUTROABS 3.6 11/16/2021     Lab Results  Component Value Date   INR 2.1 (H) 11/16/2021   LABPROT 24.0 (H) 11/16/2021     Medications: I have reviewed the patient's current medications.   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 He will return for a monthly PT check.  Indwelling IVC catheter.  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        4. 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 erectile dysfunction-followed by Dr. Jeffie Pollock , status post placement of a penile implant at [facility] in April 2014            7. CT of the abdomen/pelvis at [facility] urology February 2014 for evaluation of hematuria-12 mm external iliac nodes and left periaortic  retroperitoneal lymph node-11 mm-potentially related to CLL      8.  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, peripheral lymphocytosis and small palpable lymph nodes          CT abdomen/pelvis 09/18/2019-extensive abdominal pelvic lymphadenopathy including a left periaortic node measuring 5.7 cm 08/13/2020 CLL FISH panel-no evidence of trisomy 12; no evidence of D13S319; no evidence of p53 deletion or amplification; deletion of ATM present 11/05/2020-peripheral blood TP53 mutation-negative Cycle 1 bendamustine/Rituxan 10/08/2020, 10/09/2020 Cycle 2 bendamustine/Rituxan 11/05/2020, 11/06/2020 Cycle 3 bendamustine/Rituxan 12/03/2020, 12/04/2020 Cycle 4 bendamustine/Rituxan 12/31/2020, 01/01/2021 CTs 01/19/2021-decrease in bulky abdomen/pelvic lymphadenopathy with more widespread ill-defined "haziness "in the mesentery and retroperitoneal fat, decreased pelvic lymphadenopathy Cycle 5 bendamustine/Rituxan 01/28/2021, 01/29/2021 Cycle 6 bendamustine/rituximab 02/25/2021, 02/26/2021     9.  Basal cell carcinoma removed from the left superior central forehead 01/21/2020    10.  Renal insufficiency      Disposition: 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  The PT/INR is therapeutic.  He will continue Coumadin at current dose.  He will contact us with he is scheduled for the tooth extraction.  He will hold Coumadin for 3 days prior to the extraction and return for a PT/INR on the morning of the procedure.  He will be scheduled for an office and lab visit in 4 months.  We will manage the Coumadin anticoagulation in the interim.  He plans to obtain  a COVID-19 vaccine in January.  He will be due for Evusheld when he returns in 4 months.  Betsy Coder, MD  11/16/2021  12:25 PM

## 2021-11-29 ENCOUNTER — Encounter: Payer: Self-pay | Admitting: Oncology

## 2021-12-05 ENCOUNTER — Other Ambulatory Visit: Payer: Self-pay | Admitting: Family Medicine

## 2021-12-07 ENCOUNTER — Other Ambulatory Visit: Payer: Self-pay

## 2021-12-07 DIAGNOSIS — C9110 Chronic lymphocytic leukemia of B-cell type not having achieved remission: Secondary | ICD-10-CM

## 2021-12-07 MED ORDER — TRAMADOL HCL 50 MG PO TABS: 50.0000 mg | ORAL_TABLET | Freq: Four times a day (QID) | ORAL | 0 refills | Status: DC | PRN

## 2021-12-07 NOTE — Addendum Note (Signed)
Addended by: Campbell Riches on: 12/07/2021 09:25 AM   Modules accepted: Orders

## 2021-12-08 ENCOUNTER — Encounter: Payer: Self-pay | Admitting: Oncology

## 2021-12-08 ENCOUNTER — Inpatient Hospital Stay: Payer: MEDICARE | Attending: Oncology

## 2021-12-08 ENCOUNTER — Other Ambulatory Visit: Payer: Self-pay

## 2021-12-08 DIAGNOSIS — Z86718 Personal history of other venous thrombosis and embolism: Secondary | ICD-10-CM | POA: Diagnosis not present

## 2021-12-08 DIAGNOSIS — C9110 Chronic lymphocytic leukemia of B-cell type not having achieved remission: Secondary | ICD-10-CM | POA: Diagnosis present

## 2021-12-08 DIAGNOSIS — Z7901 Long term (current) use of anticoagulants: Secondary | ICD-10-CM | POA: Diagnosis not present

## 2021-12-08 LAB — PROTIME-INR
INR: 1.4 — ABNORMAL HIGH (ref 0.8–1.2)
Prothrombin Time: 17 seconds — ABNORMAL HIGH (ref 11.4–15.2)

## 2021-12-29 ENCOUNTER — Encounter: Payer: Self-pay | Admitting: Oncology

## 2022-01-04 ENCOUNTER — Other Ambulatory Visit: Payer: PRIVATE HEALTH INSURANCE

## 2022-01-08 ENCOUNTER — Other Ambulatory Visit: Payer: Self-pay

## 2022-01-08 ENCOUNTER — Inpatient Hospital Stay: Payer: MEDICARE | Attending: Oncology

## 2022-01-08 ENCOUNTER — Other Ambulatory Visit: Payer: MEDICARE

## 2022-01-08 DIAGNOSIS — C9110 Chronic lymphocytic leukemia of B-cell type not having achieved remission: Secondary | ICD-10-CM | POA: Insufficient documentation

## 2022-01-08 DIAGNOSIS — Z7901 Long term (current) use of anticoagulants: Secondary | ICD-10-CM | POA: Insufficient documentation

## 2022-01-08 LAB — CBC WITH DIFFERENTIAL (CANCER CENTER ONLY)
Abs Immature Granulocytes: 0.02 10*3/uL (ref 0.00–0.07)
Basophils Absolute: 0 10*3/uL (ref 0.0–0.1)
Basophils Relative: 1 %
Eosinophils Absolute: 0.4 10*3/uL (ref 0.0–0.5)
Eosinophils Relative: 6 %
HCT: 43.1 % (ref 39.0–52.0)
Hemoglobin: 14.2 g/dL (ref 13.0–17.0)
Immature Granulocytes: 0 %
Lymphocytes Relative: 20 %
Lymphs Abs: 1.3 10*3/uL (ref 0.7–4.0)
MCH: 30.8 pg (ref 26.0–34.0)
MCHC: 32.9 g/dL (ref 30.0–36.0)
MCV: 93.5 fL (ref 80.0–100.0)
Monocytes Absolute: 0.6 10*3/uL (ref 0.1–1.0)
Monocytes Relative: 10 %
Neutro Abs: 4.1 10*3/uL (ref 1.7–7.7)
Neutrophils Relative %: 63 %
Platelet Count: 118 10*3/uL — ABNORMAL LOW (ref 150–400)
RBC: 4.61 MIL/uL (ref 4.22–5.81)
RDW: 13.1 % (ref 11.5–15.5)
WBC Count: 6.4 10*3/uL (ref 4.0–10.5)
nRBC: 0 % (ref 0.0–0.2)

## 2022-01-08 LAB — PROTIME-INR
INR: 2.4 — ABNORMAL HIGH (ref 0.8–1.2)
Prothrombin Time: 26.5 seconds — ABNORMAL HIGH (ref 11.4–15.2)

## 2022-01-09 ENCOUNTER — Other Ambulatory Visit: Payer: Self-pay | Admitting: Oncology

## 2022-01-09 DIAGNOSIS — Z7901 Long term (current) use of anticoagulants: Secondary | ICD-10-CM

## 2022-01-11 ENCOUNTER — Telehealth: Payer: Self-pay | Admitting: *Deleted

## 2022-01-11 ENCOUNTER — Encounter: Payer: Self-pay | Admitting: Oncology

## 2022-01-11 NOTE — Telephone Encounter (Signed)
Informed patient that per MD continue Warfarin at same does, that he reports as 10 mg daily. F/U as scheduled.

## 2022-01-11 NOTE — Telephone Encounter (Signed)
-----   Message from Ladell Pier, MD sent at 01/08/2022  4:25 PM EST ----- Please call patient, continue same Coumadin dose, follow-up as scheduled

## 2022-02-17 ENCOUNTER — Encounter: Payer: Self-pay | Admitting: Oncology

## 2022-02-20 ENCOUNTER — Encounter (HOSPITAL_BASED_OUTPATIENT_CLINIC_OR_DEPARTMENT_OTHER): Payer: Self-pay | Admitting: Family Medicine

## 2022-02-22 ENCOUNTER — Encounter: Payer: Self-pay | Admitting: Oncology

## 2022-02-26 ENCOUNTER — Encounter (HOSPITAL_BASED_OUTPATIENT_CLINIC_OR_DEPARTMENT_OTHER): Payer: Self-pay

## 2022-02-26 ENCOUNTER — Ambulatory Visit (HOSPITAL_BASED_OUTPATIENT_CLINIC_OR_DEPARTMENT_OTHER): Payer: MEDICARE | Admitting: Family Medicine

## 2022-02-28 ENCOUNTER — Other Ambulatory Visit: Payer: Self-pay | Admitting: Internal Medicine

## 2022-02-28 NOTE — Telephone Encounter (Signed)
Please refill as per office routine med refill policy (all routine meds to be refilled for 3 mo or monthly (per pt preference) up to one year from last visit, then month to month grace period for 3 mo, then further med refills will have to be denied) ? ?

## 2022-03-06 ENCOUNTER — Encounter: Payer: Self-pay | Admitting: Oncology

## 2022-03-19 ENCOUNTER — Other Ambulatory Visit (HOSPITAL_BASED_OUTPATIENT_CLINIC_OR_DEPARTMENT_OTHER): Payer: Self-pay | Admitting: Family Medicine

## 2022-03-19 ENCOUNTER — Other Ambulatory Visit: Payer: Self-pay | Admitting: Oncology

## 2022-03-19 ENCOUNTER — Telehealth: Payer: Self-pay | Admitting: *Deleted

## 2022-03-19 DIAGNOSIS — N183 Chronic kidney disease, stage 3 unspecified: Secondary | ICD-10-CM

## 2022-03-19 DIAGNOSIS — I10 Essential (primary) hypertension: Secondary | ICD-10-CM

## 2022-03-19 DIAGNOSIS — I82409 Acute embolism and thrombosis of unspecified deep veins of unspecified lower extremity: Secondary | ICD-10-CM

## 2022-03-19 NOTE — Telephone Encounter (Addendum)
Received refill request for warfarin 2.5 mg. Last conversation w/patient he reported taking 10 mg daily. Requested return call or MyChart response to confirm his dosing before refill can be approved. ?

## 2022-03-19 NOTE — Telephone Encounter (Signed)
Awaiting return call from patient to confirm current dosing prior to refill. ?

## 2022-03-22 ENCOUNTER — Encounter: Payer: Self-pay | Admitting: Oncology

## 2022-03-22 NOTE — Telephone Encounter (Signed)
Confirmed with patient today he is not using the 2.5 mg warfarin at this time. Only takes 10 mg daily. ?

## 2022-03-24 ENCOUNTER — Encounter: Payer: Self-pay | Admitting: Oncology

## 2022-03-25 ENCOUNTER — Ambulatory Visit: Payer: PRIVATE HEALTH INSURANCE

## 2022-03-25 ENCOUNTER — Inpatient Hospital Stay: Payer: MEDICARE

## 2022-03-25 ENCOUNTER — Inpatient Hospital Stay: Payer: MEDICARE | Attending: Oncology | Admitting: Oncology

## 2022-03-25 ENCOUNTER — Ambulatory Visit (INDEPENDENT_AMBULATORY_CARE_PROVIDER_SITE_OTHER): Payer: MEDICARE | Admitting: Family Medicine

## 2022-03-25 ENCOUNTER — Encounter (HOSPITAL_BASED_OUTPATIENT_CLINIC_OR_DEPARTMENT_OTHER): Payer: Self-pay | Admitting: Family Medicine

## 2022-03-25 VITALS — BP 150/80 | HR 83 | Temp 98.2°F | Resp 18 | Ht 71.0 in | Wt 200.0 lb

## 2022-03-25 DIAGNOSIS — N289 Disorder of kidney and ureter, unspecified: Secondary | ICD-10-CM | POA: Insufficient documentation

## 2022-03-25 DIAGNOSIS — N183 Chronic kidney disease, stage 3 unspecified: Secondary | ICD-10-CM | POA: Diagnosis not present

## 2022-03-25 DIAGNOSIS — N489 Disorder of penis, unspecified: Secondary | ICD-10-CM

## 2022-03-25 DIAGNOSIS — C9110 Chronic lymphocytic leukemia of B-cell type not having achieved remission: Secondary | ICD-10-CM

## 2022-03-25 DIAGNOSIS — Z79899 Other long term (current) drug therapy: Secondary | ICD-10-CM | POA: Insufficient documentation

## 2022-03-25 DIAGNOSIS — Z85828 Personal history of other malignant neoplasm of skin: Secondary | ICD-10-CM | POA: Insufficient documentation

## 2022-03-25 DIAGNOSIS — Z7901 Long term (current) use of anticoagulants: Secondary | ICD-10-CM

## 2022-03-25 DIAGNOSIS — I10 Essential (primary) hypertension: Secondary | ICD-10-CM | POA: Diagnosis not present

## 2022-03-25 DIAGNOSIS — T148XXA Other injury of unspecified body region, initial encounter: Secondary | ICD-10-CM

## 2022-03-25 DIAGNOSIS — Z9221 Personal history of antineoplastic chemotherapy: Secondary | ICD-10-CM | POA: Diagnosis not present

## 2022-03-25 LAB — CBC WITH DIFFERENTIAL (CANCER CENTER ONLY)
Abs Immature Granulocytes: 0.03 10*3/uL (ref 0.00–0.07)
Basophils Absolute: 0 10*3/uL (ref 0.0–0.1)
Basophils Relative: 0 %
Eosinophils Absolute: 0.4 10*3/uL (ref 0.0–0.5)
Eosinophils Relative: 5 %
HCT: 43.2 % (ref 39.0–52.0)
Hemoglobin: 14.6 g/dL (ref 13.0–17.0)
Immature Granulocytes: 0 %
Lymphocytes Relative: 15 %
Lymphs Abs: 1.2 10*3/uL (ref 0.7–4.0)
MCH: 30.7 pg (ref 26.0–34.0)
MCHC: 33.8 g/dL (ref 30.0–36.0)
MCV: 90.9 fL (ref 80.0–100.0)
Monocytes Absolute: 0.7 10*3/uL (ref 0.1–1.0)
Monocytes Relative: 8 %
Neutro Abs: 5.8 10*3/uL (ref 1.7–7.7)
Neutrophils Relative %: 72 %
Platelet Count: 118 10*3/uL — ABNORMAL LOW (ref 150–400)
RBC: 4.75 MIL/uL (ref 4.22–5.81)
RDW: 12.6 % (ref 11.5–15.5)
WBC Count: 8.1 10*3/uL (ref 4.0–10.5)
nRBC: 0 % (ref 0.0–0.2)

## 2022-03-25 LAB — PROTIME-INR
INR: 1.8 — ABNORMAL HIGH (ref 0.8–1.2)
Prothrombin Time: 20.8 seconds — ABNORMAL HIGH (ref 11.4–15.2)

## 2022-03-25 MED ORDER — LOSARTAN POTASSIUM 50 MG PO TABS: 50.0000 mg | ORAL_TABLET | Freq: Every day | ORAL | 3 refills | Status: DC

## 2022-03-25 MED ORDER — HYDROCHLOROTHIAZIDE 12.5 MG PO TABS: 12.5000 mg | ORAL_TABLET | Freq: Every day | ORAL | 3 refills | Status: DC

## 2022-03-25 MED ORDER — PRAVASTATIN SODIUM 40 MG PO TABS: 40.0000 mg | ORAL_TABLET | Freq: Every day | ORAL | 3 refills | Status: DC

## 2022-03-25 MED ORDER — TRAMADOL HCL 50 MG PO TABS: 50.0000 mg | ORAL_TABLET | Freq: Four times a day (QID) | ORAL | 0 refills | Status: DC | PRN

## 2022-03-25 NOTE — Progress Notes (Signed)
?[place] ?OFFICE PROGRESS NOTE ? ? ?Diagnosis: CLL, anticoagulation therapy ? ?INTERVAL HISTORY:  ? ?[name] returns as scheduled.  He feels well.  No fever or night sweats.  No palpable lymph nodes.  He plans to have a tooth extracted next week.  He continues Coumadin anticoagulation. ? ?Objective: ? ?Vital signs in last 24 hours: ? ?Blood pressure (!) 150/80, pulse 83, temperature 98.2 ?F (36.8 ?C), temperature source Oral, resp. rate 18, height _0  (1.803 m), weight 200 lb (90.7 kg), SpO2 98 %. ?  ? ?HEENT: Neck without mass ?Lymphatics: No cervical, supraclavicular, axillary, or inguinal nodes ?Resp: Clear bilaterally ?Cardio: Regular rate and rhythm ?GI: No mass, slight firm fullness on deep palpation at the central mid abdomen ?Vascular: No leg edema ? ? ?Lab Results: ? ?Lab Results  ?Component Value Date  ? WBC 8.1 03/25/2022  ? HGB 14.6 03/25/2022  ? HCT 43.2 03/25/2022  ? MCV 90.9 03/25/2022  ? PLT 118 (L) 03/25/2022  ? NEUTROABS 5.8 03/25/2022  ? ? ?CMP  ?Lab Results  ?Component Value Date  ? NA 141 02/25/2021  ? K 4.7 02/25/2021  ? CL 102 02/25/2021  ? CO2 29 02/25/2021  ? GLUCOSE 76 02/25/2021  ? BUN 31 (H) 02/25/2021  ? CREATININE 1.61 (H) 02/25/2021  ? CALCIUM 9.0 02/25/2021  ? PROT 7.4 02/25/2021  ? ALBUMIN 4.1 02/25/2021  ? AST 18 02/25/2021  ? ALT 16 02/25/2021  ? ALKPHOS 44 02/25/2021  ? BILITOT 0.5 02/25/2021  ? GFRNONAA 44 (L) 02/25/2021  ? GFRAA 52 (L) 08/13/2020  ? ? ? ?Lab Results  ?Component Value Date  ? INR 1.8 (H) 03/25/2022  ? LABPROT 20.8 (H) 03/25/2022  ? ? ? ?Medications: I have reviewed the patient's current medications. ? ? 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 He will return for a monthly PT check.  ?Indwelling IVC catheter.  ?Chronic stasis change of the low legs, on the right greater than left.  ?      4. 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       5. erectile dysfunction-followed by Dr. Jeffie Pollock , status post placement of a penile implant at [facility] in April 2014 ?     ?      7. CT of the abdomen/pelvis at [facility] urology February 2014 for evaluation of hematuria-12 mm external iliac nodes and left periaortic retroperitoneal lymph node-11 mm-potentially related to CLL ?     8.  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         CT abdomen/pelvis 09/18/2019-extensive abdominal pelvic lymphadenopathy including a left periaortic node measuring 5.7 cm ?08/13/2020 CLL FISH panel-no evidence of trisomy 12; no evidence of D13S319; no evidence of p53 deletion or amplification; deletion of ATM present ?11/05/2020-peripheral blood TP53 mutation-negative ?Cycle 1 bendamustine/Rituxan 10/08/2020, 10/09/2020 ?Cycle 2 bendamustine/Rituxan 11/05/2020, 11/06/2020 ?Cycle 3 bendamustine/Rituxan 12/03/2020, 12/04/2020 ?Cycle 4 bendamustine/Rituxan 12/31/2020, 01/01/2021 ?CTs 01/19/2021-decrease in bulky abdomen/pelvic lymphadenopathy with more widespread ill-defined "haziness "in the mesentery and retroperitoneal fat, decreased pelvic lymphadenopathy ?Cycle 5 bendamustine/Rituxan 01/28/2021, 01/29/2021 ?Cycle 6 bendamustine/rituximab 02/25/2021, 02/26/2021 ?    9.  Basal cell carcinoma removed from the left superior central forehead 01/21/2020 ?   10.  Renal insufficiency ?  ? ? ?Disposition: ?Mr. Olthoff appears stable.  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  The plan is to  continue observation.  He will continue Coumadin at the current dose. ?He will hold Coumadin surrounding the planned tooth extraction.  He will resume Coumadin on the day of the procedure.  He will return for a PT/INR in 1 month.  He will be scheduled for an office visit in 4 months. ? ?Betsy Coder, MD ? ?03/25/2022  ?12:48 PM ? ? ?

## 2022-03-25 NOTE — Assessment & Plan Note (Signed)
Patient reports a couple weeks ago he began to notice a spot on his penis.  Area eventually swelled and then popped with some mild discharge.  Indicates it is currently scabbed over and appears to be getting smaller.  He has not had any fevers, no other rash noted.  He has not had any hematuria, dysuria, penile discharge ?On exam, small scab noted over dorsal aspect of penis about midway down the shaft.  There is no tenderness to palpation at present, no discharge, no inguinal lymphadenopathy.  No other lesions or rash noted in general area ?Feel that this is an isolated skin issue.  Did discuss STI testing today, patient declined ?Recommend continue to monitor for resolution, would expect area to heal up in the next 1 to 2 weeks ?Also recommend to monitor for any systemic symptoms such as fevers, chills ?

## 2022-03-25 NOTE — Assessment & Plan Note (Signed)
Patient recently bumped his forehead and had swelling in the area, this is located over right frontal area near hairline.  He had some swelling initially, tenderness.  Still some swelling at present, however this has been improving slowly.  He has not had any drainage in the area.  Only worsen when having direct pressure over that spot such as when wearing a hat ?On exam, patient with small amount of swelling over right forehead, area is generally firm, no fluctuance, mild warmth, mild tenderness to palpation. ?Feel the area represents resolving hematoma, less likely underlying abscess ?Discussed expectation that area should continue to gradually resolve with eventual resolution in the coming 4 to 6 weeks ?If patient does have any worsening or if area fails to completely resolve, recommend returning to office for further evaluation ?

## 2022-03-25 NOTE — Progress Notes (Signed)
? ? ?  Procedures performed today:   ? ?None. ? ?Independent interpretation of notes and tests performed by another provider:  ? ?None. ? ?Brief History, Exam, Impression, and Recommendations:   ? ?BP (!) 150/80   Pulse 83   Ht '5\' 11"'$  (1.803 m)   Wt 200 lb 6.4 oz (90.9 kg)   SpO2 98%   BMI 27.95 kg/m?  ? ?Hematoma ?Patient recently bumped his forehead and had swelling in the area, this is located over right frontal area near hairline.  He had some swelling initially, tenderness.  Still some swelling at present, however this has been improving slowly.  He has not had any drainage in the area.  Only worsen when having direct pressure over that spot such as when wearing a hat ?On exam, patient with small amount of swelling over right forehead, area is generally firm, no fluctuance, mild warmth, mild tenderness to palpation. ?Feel the area represents resolving hematoma, less likely underlying abscess ?Discussed expectation that area should continue to gradually resolve with eventual resolution in the coming 4 to 6 weeks ?If patient does have any worsening or if area fails to completely resolve, recommend returning to office for further evaluation ? ?Penile lesion ?Patient reports a couple weeks ago he began to notice a spot on his penis.  Area eventually swelled and then popped with some mild discharge.  Indicates it is currently scabbed over and appears to be getting smaller.  He has not had any fevers, no other rash noted.  He has not had any hematuria, dysuria, penile discharge ?On exam, small scab noted over dorsal aspect of penis about midway down the shaft.  There is no tenderness to palpation at present, no discharge, no inguinal lymphadenopathy.  No other lesions or rash noted in general area ?Feel that this is an isolated skin issue.  Did discuss STI testing today, patient declined ?Recommend continue to monitor for resolution, would expect area to heal up in the next 1 to 2 weeks ?Also recommend to monitor  for any systemic symptoms such as fevers, chills ? ?Plan for follow-up in 6 months or sooner as needed ? ? ?___________________________________________ ?Chad Saxer de [place], MD, ABFM, CAQSM ?Primary Care and Sports Medicine ?[place] ?

## 2022-03-30 ENCOUNTER — Encounter: Payer: Self-pay | Admitting: Oncology

## 2022-03-30 ENCOUNTER — Inpatient Hospital Stay: Payer: MEDICARE | Attending: Oncology

## 2022-03-30 DIAGNOSIS — Z7901 Long term (current) use of anticoagulants: Secondary | ICD-10-CM | POA: Insufficient documentation

## 2022-03-30 DIAGNOSIS — C9111 Chronic lymphocytic leukemia of B-cell type in remission: Secondary | ICD-10-CM | POA: Insufficient documentation

## 2022-03-30 LAB — PROTIME-INR
INR: 1.3 — ABNORMAL HIGH (ref 0.8–1.2)
Prothrombin Time: 15.7 seconds — ABNORMAL HIGH (ref 11.4–15.2)

## 2022-04-22 ENCOUNTER — Inpatient Hospital Stay: Payer: MEDICARE

## 2022-04-22 DIAGNOSIS — C9111 Chronic lymphocytic leukemia of B-cell type in remission: Secondary | ICD-10-CM | POA: Diagnosis not present

## 2022-04-22 DIAGNOSIS — Z7901 Long term (current) use of anticoagulants: Secondary | ICD-10-CM

## 2022-04-22 LAB — PROTIME-INR
INR: 1.9 — ABNORMAL HIGH (ref 0.8–1.2)
Prothrombin Time: 22 seconds — ABNORMAL HIGH (ref 11.4–15.2)

## 2022-04-23 ENCOUNTER — Telehealth: Payer: Self-pay

## 2022-04-23 NOTE — Telephone Encounter (Signed)
Pt verbalized understanding.

## 2022-04-23 NOTE — Telephone Encounter (Signed)
-----   Message from Ladell Pier, MD sent at 04/22/2022  5:11 PM EDT ----- Please call patient, continue Coumadin at the current dose, repeat PT/INR in 1 month

## 2022-04-28 ENCOUNTER — Encounter (HOSPITAL_BASED_OUTPATIENT_CLINIC_OR_DEPARTMENT_OTHER): Payer: Self-pay | Admitting: Family Medicine

## 2022-04-29 ENCOUNTER — Encounter: Payer: Self-pay | Admitting: Oncology

## 2022-04-29 ENCOUNTER — Other Ambulatory Visit (HOSPITAL_BASED_OUTPATIENT_CLINIC_OR_DEPARTMENT_OTHER): Payer: Self-pay | Admitting: Nurse Practitioner

## 2022-04-29 DIAGNOSIS — L255 Unspecified contact dermatitis due to plants, except food: Secondary | ICD-10-CM

## 2022-04-29 MED ORDER — PREDNISONE 20 MG PO TABS: 40.0000 mg | ORAL_TABLET | Freq: Every day | ORAL | 0 refills | Status: DC

## 2022-05-20 ENCOUNTER — Telehealth: Payer: Self-pay

## 2022-05-20 ENCOUNTER — Other Ambulatory Visit: Payer: Self-pay

## 2022-05-20 ENCOUNTER — Inpatient Hospital Stay: Payer: MEDICARE | Attending: Oncology

## 2022-05-20 DIAGNOSIS — Z79818 Long term (current) use of other agents affecting estrogen receptors and estrogen levels: Secondary | ICD-10-CM | POA: Diagnosis not present

## 2022-05-20 DIAGNOSIS — Z7901 Long term (current) use of anticoagulants: Secondary | ICD-10-CM | POA: Insufficient documentation

## 2022-05-20 DIAGNOSIS — I878 Other specified disorders of veins: Secondary | ICD-10-CM | POA: Insufficient documentation

## 2022-05-20 DIAGNOSIS — C9111 Chronic lymphocytic leukemia of B-cell type in remission: Secondary | ICD-10-CM | POA: Diagnosis present

## 2022-05-20 DIAGNOSIS — C9110 Chronic lymphocytic leukemia of B-cell type not having achieved remission: Secondary | ICD-10-CM

## 2022-05-20 DIAGNOSIS — D696 Thrombocytopenia, unspecified: Secondary | ICD-10-CM | POA: Insufficient documentation

## 2022-05-20 DIAGNOSIS — Z95818 Presence of other cardiac implants and grafts: Secondary | ICD-10-CM | POA: Insufficient documentation

## 2022-05-20 LAB — PROTIME-INR
INR: 2.3 — ABNORMAL HIGH (ref 0.8–1.2)
Prothrombin Time: 25 seconds — ABNORMAL HIGH (ref 11.4–15.2)

## 2022-05-20 LAB — CBC WITH DIFFERENTIAL (CANCER CENTER ONLY)
Abs Immature Granulocytes: 0.05 10*3/uL (ref 0.00–0.07)
Basophils Absolute: 0 10*3/uL (ref 0.0–0.1)
Basophils Relative: 0 %
Eosinophils Absolute: 0.5 10*3/uL (ref 0.0–0.5)
Eosinophils Relative: 6 %
HCT: 43.4 % (ref 39.0–52.0)
Hemoglobin: 14.4 g/dL (ref 13.0–17.0)
Immature Granulocytes: 1 %
Lymphocytes Relative: 16 %
Lymphs Abs: 1.3 10*3/uL (ref 0.7–4.0)
MCH: 30.7 pg (ref 26.0–34.0)
MCHC: 33.2 g/dL (ref 30.0–36.0)
MCV: 92.5 fL (ref 80.0–100.0)
Monocytes Absolute: 0.8 10*3/uL (ref 0.1–1.0)
Monocytes Relative: 10 %
Neutro Abs: 5.6 10*3/uL (ref 1.7–7.7)
Neutrophils Relative %: 67 %
Platelet Count: 119 10*3/uL — ABNORMAL LOW (ref 150–400)
RBC: 4.69 MIL/uL (ref 4.22–5.81)
RDW: 12.9 % (ref 11.5–15.5)
WBC Count: 8.3 10*3/uL (ref 4.0–10.5)
nRBC: 0 % (ref 0.0–0.2)

## 2022-05-20 NOTE — Telephone Encounter (Signed)
Pt verbalized understanding. Pt already scheduled for 1 month lab

## 2022-05-20 NOTE — Telephone Encounter (Signed)
-----   Message from Ladell Pier, MD sent at 05/20/2022  2:31 PM EDT ----- Please call patient, PT/INR is therapeutic, continue Coumadin same dose, CBC is stable, repeat lab PT/INR and CBC in 1 month, follow-up as scheduled

## 2022-06-15 ENCOUNTER — Other Ambulatory Visit: Payer: Self-pay

## 2022-06-15 DIAGNOSIS — C9110 Chronic lymphocytic leukemia of B-cell type not having achieved remission: Secondary | ICD-10-CM

## 2022-06-17 ENCOUNTER — Inpatient Hospital Stay: Payer: MEDICARE | Attending: Oncology

## 2022-06-17 DIAGNOSIS — C9110 Chronic lymphocytic leukemia of B-cell type not having achieved remission: Secondary | ICD-10-CM

## 2022-06-17 DIAGNOSIS — C9111 Chronic lymphocytic leukemia of B-cell type in remission: Secondary | ICD-10-CM | POA: Diagnosis present

## 2022-06-17 LAB — CBC WITH DIFFERENTIAL (CANCER CENTER ONLY)
Abs Immature Granulocytes: 0.05 10*3/uL (ref 0.00–0.07)
Basophils Absolute: 0 10*3/uL (ref 0.0–0.1)
Basophils Relative: 1 %
Eosinophils Absolute: 0.4 10*3/uL (ref 0.0–0.5)
Eosinophils Relative: 6 %
HCT: 42.7 % (ref 39.0–52.0)
Hemoglobin: 14.8 g/dL (ref 13.0–17.0)
Immature Granulocytes: 1 %
Lymphocytes Relative: 17 %
Lymphs Abs: 1.3 10*3/uL (ref 0.7–4.0)
MCH: 31.5 pg (ref 26.0–34.0)
MCHC: 34.7 g/dL (ref 30.0–36.0)
MCV: 90.9 fL (ref 80.0–100.0)
Monocytes Absolute: 0.8 10*3/uL (ref 0.1–1.0)
Monocytes Relative: 11 %
Neutro Abs: 5.1 10*3/uL (ref 1.7–7.7)
Neutrophils Relative %: 64 %
Platelet Count: 112 10*3/uL — ABNORMAL LOW (ref 150–400)
RBC: 4.7 MIL/uL (ref 4.22–5.81)
RDW: 12.7 % (ref 11.5–15.5)
WBC Count: 7.8 10*3/uL (ref 4.0–10.5)
nRBC: 0 % (ref 0.0–0.2)

## 2022-06-17 LAB — PROTIME-INR
INR: 1.9 — ABNORMAL HIGH (ref 0.8–1.2)
Prothrombin Time: 21.8 seconds — ABNORMAL HIGH (ref 11.4–15.2)

## 2022-06-18 ENCOUNTER — Telehealth: Payer: Self-pay

## 2022-06-18 NOTE — Telephone Encounter (Signed)
-----   Message from Ladell Pier, MD sent at 06/17/2022  5:59 PM EDT ----- Please call patient, continue Coumadin at same dose, follow-up as scheduled

## 2022-06-18 NOTE — Telephone Encounter (Signed)
V/M message left for Pt. Informed if any problems,questions, or concerns to return call to office.

## 2022-06-21 ENCOUNTER — Other Ambulatory Visit: Payer: MEDICARE

## 2022-06-21 ENCOUNTER — Other Ambulatory Visit: Payer: Self-pay

## 2022-06-29 ENCOUNTER — Other Ambulatory Visit: Payer: Self-pay

## 2022-07-14 ENCOUNTER — Other Ambulatory Visit: Payer: Self-pay

## 2022-07-14 DIAGNOSIS — Z7901 Long term (current) use of anticoagulants: Secondary | ICD-10-CM

## 2022-07-14 DIAGNOSIS — C9110 Chronic lymphocytic leukemia of B-cell type not having achieved remission: Secondary | ICD-10-CM

## 2022-07-15 ENCOUNTER — Inpatient Hospital Stay: Payer: MEDICARE

## 2022-07-15 ENCOUNTER — Inpatient Hospital Stay: Payer: MEDICARE | Attending: Oncology | Admitting: Oncology

## 2022-07-15 VITALS — BP 126/70 | HR 62 | Temp 98.2°F | Resp 20 | Ht 71.0 in | Wt 203.6 lb

## 2022-07-15 DIAGNOSIS — C9110 Chronic lymphocytic leukemia of B-cell type not having achieved remission: Secondary | ICD-10-CM | POA: Diagnosis not present

## 2022-07-15 DIAGNOSIS — Z86718 Personal history of other venous thrombosis and embolism: Secondary | ICD-10-CM | POA: Insufficient documentation

## 2022-07-15 DIAGNOSIS — C9111 Chronic lymphocytic leukemia of B-cell type in remission: Secondary | ICD-10-CM | POA: Insufficient documentation

## 2022-07-15 DIAGNOSIS — D696 Thrombocytopenia, unspecified: Secondary | ICD-10-CM | POA: Insufficient documentation

## 2022-07-15 DIAGNOSIS — Z7901 Long term (current) use of anticoagulants: Secondary | ICD-10-CM | POA: Diagnosis not present

## 2022-07-15 LAB — CBC WITH DIFFERENTIAL (CANCER CENTER ONLY)
Abs Immature Granulocytes: 0.05 10*3/uL (ref 0.00–0.07)
Basophils Absolute: 0 10*3/uL (ref 0.0–0.1)
Basophils Relative: 1 %
Eosinophils Absolute: 0.4 10*3/uL (ref 0.0–0.5)
Eosinophils Relative: 5 %
HCT: 44.7 % (ref 39.0–52.0)
Hemoglobin: 15.2 g/dL (ref 13.0–17.0)
Immature Granulocytes: 1 %
Lymphocytes Relative: 19 %
Lymphs Abs: 1.3 10*3/uL (ref 0.7–4.0)
MCH: 31.1 pg (ref 26.0–34.0)
MCHC: 34 g/dL (ref 30.0–36.0)
MCV: 91.4 fL (ref 80.0–100.0)
Monocytes Absolute: 0.7 10*3/uL (ref 0.1–1.0)
Monocytes Relative: 11 %
Neutro Abs: 4.4 10*3/uL (ref 1.7–7.7)
Neutrophils Relative %: 63 %
Platelet Count: 115 10*3/uL — ABNORMAL LOW (ref 150–400)
RBC: 4.89 MIL/uL (ref 4.22–5.81)
RDW: 12.7 % (ref 11.5–15.5)
WBC Count: 6.9 10*3/uL (ref 4.0–10.5)
nRBC: 0 % (ref 0.0–0.2)

## 2022-07-15 LAB — PROTIME-INR
INR: 1.8 — ABNORMAL HIGH (ref 0.8–1.2)
Prothrombin Time: 20.4 seconds — ABNORMAL HIGH (ref 11.4–15.2)

## 2022-07-15 NOTE — Progress Notes (Addendum)
[place] OFFICE PROGRESS NOTE   Diagnosis: CLL, Coumadin anticoagulation  INTERVAL HISTORY:   [name] returns as scheduled.  He continues Coumadin anticoagulation.  No symptom of recurrent thrombosis.  No fever or night sweats.  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  He is scheduled for Mohs surgery 07/26/2022.  Objective:  Vital signs in last 24 hours:  Blood pressure 126/70, pulse 62, temperature 98.2 F (36.8 C), temperature source Oral, resp. rate 20, height _0  (1.803 m), weight 203 lb 9.6 oz (92.4 kg), SpO2 100 %.    HEENT: Neck without mass Lymphatics: No cervical, supraclavicular, axillary, or inguinal nodes Resp: Lungs clear bilaterally Cardio: Regular rate and rhythm GI: No hepatosplenomegaly, firm fullness on deep palpation superior to the umbilicus and to the left of midline Vascular: The right lower leg is larger than the left side  Skin: Raised lesion at the parietal scalp with a central biopsy defect   Lab Results:  Lab Results  Component Value Date   WBC 6.9 07/15/2022   HGB 15.2 07/15/2022   HCT 44.7 07/15/2022   MCV 91.4 07/15/2022   PLT 115 (L) 07/15/2022   NEUTROABS 4.4 07/15/2022    CMP  Lab Results  Component Value Date   NA 141 02/25/2021   K 4.7 02/25/2021   CL 102 02/25/2021   CO2 29 02/25/2021   GLUCOSE 76 02/25/2021   BUN 31 (H) 02/25/2021   CREATININE 1.61 (H) 02/25/2021   CALCIUM 9.0 02/25/2021   PROT 7.4 02/25/2021   ALBUMIN 4.1 02/25/2021   AST 18 02/25/2021   ALT 16 02/25/2021   ALKPHOS 44 02/25/2021   BILITOT 0.5 02/25/2021   GFRNONAA 44 (L) 02/25/2021   GFRAA 52 (L) 08/13/2020    Medications: I have reviewed the patient's current medications.   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 He will return for a monthly PT check.  Indwelling IVC catheter.  Chronic stasis change of the low legs, on the right  greater than left.        4. 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 erectile dysfunction-followed by Dr. [name] , status post placement of a penile implant at [facility] in April 2014            7. CT of the abdomen/pelvis at [facility] urology February 2014 for evaluation of hematuria-12 mm external iliac nodes and left periaortic retroperitoneal lymph node-11 mm-potentially related to CLL      8.  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, peripheral lymphocytosis and small palpable lymph nodes          CT abdomen/pelvis 09/18/2019-extensive abdominal pelvic lymphadenopathy including a left periaortic node measuring 5.7 cm 08/13/2020 CLL FISH panel-no evidence of trisomy 12; no evidence of D13S319; no evidence of p53 deletion or amplification; deletion of ATM present 11/05/2020-peripheral blood TP53 mutation-negative Cycle 1 bendamustine/Rituxan 10/08/2020, 10/09/2020 Cycle 2 bendamustine/Rituxan 11/05/2020, 11/06/2020 Cycle 3 bendamustine/Rituxan 12/03/2020, 12/04/2020 Cycle 4 bendamustine/Rituxan 12/31/2020, 01/01/2021 CTs 01/19/2021-decrease in bulky abdomen/pelvic lymphadenopathy with more widespread ill-defined "haziness "in the mesentery and retroperitoneal fat, decreased pelvic lymphadenopathy Cycle 5 bendamustine/Rituxan 01/28/2021, 01/29/2021 Cycle 6 bendamustine/rituximab 02/25/2021, 02/26/2021     9.  Basal cell carcinoma removed from the left superior central forehead 01/21/2020    10.  Renal insufficiency      Disposition: [name] remains in clinical remission from CLL.  He is stable from a hematologic standpoint. The PT/INR is slightly subtherapeutic.  He will continue Coumadin at current dose.  We will increase the Coumadin dose if the PT/INR remains subtherapeutic when he returns next month.  He will remain up-to-date on the influenza and COVID-19 vaccines.  He will be due for a PCV 20 vaccine in  January 2025.  He will return for a PT/INR in 1 month.  He will be scheduled for an office visit in 4 months.  We will adjust the Coumadin dose as indicated when he returns next month.  Betsy Coder, MD  07/15/2022  12:50 PM

## 2022-07-19 ENCOUNTER — Other Ambulatory Visit: Payer: Self-pay | Admitting: *Deleted

## 2022-07-19 DIAGNOSIS — Z7901 Long term (current) use of anticoagulants: Secondary | ICD-10-CM

## 2022-07-19 DIAGNOSIS — C9110 Chronic lymphocytic leukemia of B-cell type not having achieved remission: Secondary | ICD-10-CM

## 2022-07-19 NOTE — Progress Notes (Signed)
Lab orders placed.  

## 2022-08-11 ENCOUNTER — Encounter: Payer: Self-pay | Admitting: Oncology

## 2022-08-16 ENCOUNTER — Other Ambulatory Visit (HOSPITAL_BASED_OUTPATIENT_CLINIC_OR_DEPARTMENT_OTHER): Payer: Self-pay

## 2022-08-16 ENCOUNTER — Inpatient Hospital Stay: Payer: MEDICARE | Attending: Oncology

## 2022-08-16 DIAGNOSIS — C9110 Chronic lymphocytic leukemia of B-cell type not having achieved remission: Secondary | ICD-10-CM

## 2022-08-16 DIAGNOSIS — C9111 Chronic lymphocytic leukemia of B-cell type in remission: Secondary | ICD-10-CM | POA: Insufficient documentation

## 2022-08-16 LAB — CBC WITH DIFFERENTIAL (CANCER CENTER ONLY)
Abs Immature Granulocytes: 0.03 10*3/uL (ref 0.00–0.07)
Basophils Absolute: 0 10*3/uL (ref 0.0–0.1)
Basophils Relative: 1 %
Eosinophils Absolute: 0.5 10*3/uL (ref 0.0–0.5)
Eosinophils Relative: 7 %
HCT: 45.3 % (ref 39.0–52.0)
Hemoglobin: 15.4 g/dL (ref 13.0–17.0)
Immature Granulocytes: 1 %
Lymphocytes Relative: 23 %
Lymphs Abs: 1.5 10*3/uL (ref 0.7–4.0)
MCH: 31.2 pg (ref 26.0–34.0)
MCHC: 34 g/dL (ref 30.0–36.0)
MCV: 91.9 fL (ref 80.0–100.0)
Monocytes Absolute: 0.7 10*3/uL (ref 0.1–1.0)
Monocytes Relative: 10 %
Neutro Abs: 3.8 10*3/uL (ref 1.7–7.7)
Neutrophils Relative %: 58 %
Platelet Count: 100 10*3/uL — ABNORMAL LOW (ref 150–400)
RBC: 4.93 MIL/uL (ref 4.22–5.81)
RDW: 12.7 % (ref 11.5–15.5)
WBC Count: 6.5 10*3/uL (ref 4.0–10.5)
nRBC: 0 % (ref 0.0–0.2)

## 2022-08-16 LAB — PROTIME-INR
INR: 1.7 — ABNORMAL HIGH (ref 0.8–1.2)
Prothrombin Time: 19.4 seconds — ABNORMAL HIGH (ref 11.4–15.2)

## 2022-08-16 MED ORDER — FLUAD QUADRIVALENT 0.5 ML IM PRSY
PREFILLED_SYRINGE | INTRAMUSCULAR | 0 refills | Status: DC
  Filled 2022-08-16: qty 0.5, 1d supply, fill #0

## 2022-08-17 ENCOUNTER — Telehealth: Payer: Self-pay | Admitting: *Deleted

## 2022-08-17 DIAGNOSIS — Z7901 Long term (current) use of anticoagulants: Secondary | ICD-10-CM

## 2022-08-17 NOTE — Telephone Encounter (Signed)
INR subtherapeutic. Per Dr. Benay Spice: Warfarin 10 mg daily, except 12.5 mg on Mondays and Thursdays. Repeat PT in 3-4 weeks. Patient aware. Scheduler notified.

## 2022-08-27 ENCOUNTER — Encounter: Payer: Self-pay | Admitting: Oncology

## 2022-09-09 ENCOUNTER — Telehealth: Payer: Self-pay | Admitting: *Deleted

## 2022-09-09 ENCOUNTER — Inpatient Hospital Stay: Payer: MEDICARE | Attending: Oncology

## 2022-09-09 DIAGNOSIS — Z86718 Personal history of other venous thrombosis and embolism: Secondary | ICD-10-CM | POA: Diagnosis not present

## 2022-09-09 DIAGNOSIS — C9110 Chronic lymphocytic leukemia of B-cell type not having achieved remission: Secondary | ICD-10-CM

## 2022-09-09 DIAGNOSIS — Z7901 Long term (current) use of anticoagulants: Secondary | ICD-10-CM

## 2022-09-09 DIAGNOSIS — C9111 Chronic lymphocytic leukemia of B-cell type in remission: Secondary | ICD-10-CM | POA: Diagnosis present

## 2022-09-09 LAB — CBC WITH DIFFERENTIAL (CANCER CENTER ONLY)
Abs Immature Granulocytes: 0.04 10*3/uL (ref 0.00–0.07)
Basophils Absolute: 0 10*3/uL (ref 0.0–0.1)
Basophils Relative: 1 %
Eosinophils Absolute: 0.4 10*3/uL (ref 0.0–0.5)
Eosinophils Relative: 6 %
HCT: 41.4 % (ref 39.0–52.0)
Hemoglobin: 14.3 g/dL (ref 13.0–17.0)
Immature Granulocytes: 1 %
Lymphocytes Relative: 23 %
Lymphs Abs: 1.4 10*3/uL (ref 0.7–4.0)
MCH: 31.7 pg (ref 26.0–34.0)
MCHC: 34.5 g/dL (ref 30.0–36.0)
MCV: 91.8 fL (ref 80.0–100.0)
Monocytes Absolute: 0.6 10*3/uL (ref 0.1–1.0)
Monocytes Relative: 9 %
Neutro Abs: 3.8 10*3/uL (ref 1.7–7.7)
Neutrophils Relative %: 60 %
Platelet Count: 103 10*3/uL — ABNORMAL LOW (ref 150–400)
RBC: 4.51 MIL/uL (ref 4.22–5.81)
RDW: 12.9 % (ref 11.5–15.5)
WBC Count: 6.2 10*3/uL (ref 4.0–10.5)
nRBC: 0 % (ref 0.0–0.2)

## 2022-09-09 LAB — PROTIME-INR
INR: 2.1 — ABNORMAL HIGH (ref 0.8–1.2)
Prothrombin Time: 23.7 seconds — ABNORMAL HIGH (ref 11.4–15.2)

## 2022-09-09 NOTE — Telephone Encounter (Signed)
Notified of 2.1 INR and to continue same warfarin dosing of 10 mg daily, except 12 5 mg on M & Th. CBC is stable. Repeat labs 11/16.

## 2022-09-09 NOTE — Telephone Encounter (Signed)
-----   Message from Ladell Pier, MD sent at 09/09/2022  1:49 PM EDT ----- Please call patient, same Coumadin, repeat CBC and PT/INR 1 month

## 2022-09-14 ENCOUNTER — Inpatient Hospital Stay: Payer: MEDICARE

## 2022-09-17 ENCOUNTER — Encounter: Payer: Self-pay | Admitting: Oncology

## 2022-09-23 ENCOUNTER — Ambulatory Visit (INDEPENDENT_AMBULATORY_CARE_PROVIDER_SITE_OTHER): Payer: MEDICARE | Admitting: Family Medicine

## 2022-09-23 ENCOUNTER — Encounter (HOSPITAL_BASED_OUTPATIENT_CLINIC_OR_DEPARTMENT_OTHER): Payer: Self-pay | Admitting: Family Medicine

## 2022-09-23 ENCOUNTER — Other Ambulatory Visit (HOSPITAL_BASED_OUTPATIENT_CLINIC_OR_DEPARTMENT_OTHER): Payer: Self-pay

## 2022-09-23 ENCOUNTER — Encounter: Payer: Self-pay | Admitting: Oncology

## 2022-09-23 VITALS — BP 137/71 | HR 78 | Temp 97.6°F | Ht 71.0 in | Wt 207.3 lb

## 2022-09-23 DIAGNOSIS — I10 Essential (primary) hypertension: Secondary | ICD-10-CM

## 2022-09-23 DIAGNOSIS — N183 Chronic kidney disease, stage 3 unspecified: Secondary | ICD-10-CM

## 2022-09-23 DIAGNOSIS — H8110 Benign paroxysmal vertigo, unspecified ear: Secondary | ICD-10-CM

## 2022-09-23 DIAGNOSIS — E785 Hyperlipidemia, unspecified: Secondary | ICD-10-CM

## 2022-09-23 MED ORDER — COMIRNATY 30 MCG/0.3ML IM SUSY
PREFILLED_SYRINGE | INTRAMUSCULAR | 0 refills | Status: DC
  Filled 2022-09-23: qty 0.3, 1d supply, fill #0

## 2022-09-23 MED ORDER — PRAVASTATIN SODIUM 40 MG PO TABS: 40.0000 mg | ORAL_TABLET | Freq: Every day | ORAL | 3 refills | Status: DC

## 2022-09-23 MED ORDER — TRAMADOL HCL 50 MG PO TABS: 50.0000 mg | ORAL_TABLET | Freq: Four times a day (QID) | ORAL | 0 refills | Status: DC | PRN

## 2022-09-23 MED ORDER — HYDROCHLOROTHIAZIDE 12.5 MG PO TABS: 12.5000 mg | ORAL_TABLET | Freq: Every day | ORAL | 3 refills | Status: DC

## 2022-09-23 MED ORDER — LOSARTAN POTASSIUM 50 MG PO TABS: 50.0000 mg | ORAL_TABLET | Freq: Every day | ORAL | 3 refills | Status: DC

## 2022-09-23 NOTE — Progress Notes (Signed)
    Procedures performed today:    None.  Independent interpretation of notes and tests performed by another provider:   None.  Brief History, Exam, Impression, and Recommendations:    BP 137/71   Pulse 78   Temp 97.6 F (36.4 C) (Oral)   Ht '5\' 11"'$  (1.803 m)   Wt 207 lb 4.8 oz (94 kg)   SpO2 97%   BMI 28.91 kg/m   Benign essential hypertension Blood pressure at goal in office today.  He reports that he has been doing well with current regimen including hydrochlorothiazide and losartan.  Requesting refill of medications today.  Given good control of blood pressure with current regimen, feel would be reasonable to continue with current medications, refill sent to pharmacy on file Recommend intermittent monitoring of blood pressure at home, DASH diet  BPV (benign positional vertigo) Reports that he has felt that he had some return of similar symptoms which she has had in the past.  Prior symptoms responded well to physical therapy.  Current symptoms primarily are an issue first in the morning.  He indicates his symptoms are very similar to the intermittent, short-lived dizziness he had been experiencing in the past which was diagnosed as BPPV and responded well to PT.  He is requesting referral to physical therapy today to return for treatment.  Referral sent to physical therapy office where he had worked out previously for patient to proceed with treatment  Hyperlipidemia Currently utilizing pravastatin, denies any issues with medication, no reported myalgias.  He has not had recent lipid panel checked, we will plan to check fasting lipid panel with upcoming lab draw in order to assess current status For now we will continue with pravastatin, refill sent to pharmacy  Return in about 6 months (around 03/25/2023).   ___________________________________________ Kanetra Ho de [place], MD, ABFM, CAQSM Primary Care and [place]

## 2022-09-23 NOTE — Assessment & Plan Note (Signed)
Blood pressure at goal in office today.  He reports that he has been doing well with current regimen including hydrochlorothiazide and losartan.  Requesting refill of medications today.  Given good control of blood pressure with current regimen, feel would be reasonable to continue with current medications, refill sent to pharmacy on file Recommend intermittent monitoring of blood pressure at home, DASH diet

## 2022-09-23 NOTE — Assessment & Plan Note (Signed)
Currently utilizing pravastatin, denies any issues with medication, no reported myalgias.  He has not had recent lipid panel checked, we will plan to check fasting lipid panel with upcoming lab draw in order to assess current status For now we will continue with pravastatin, refill sent to pharmacy

## 2022-09-23 NOTE — Assessment & Plan Note (Signed)
Reports that he has felt that he had some return of similar symptoms which she has had in the past.  Prior symptoms responded well to physical therapy.  Current symptoms primarily are an issue first in the morning.  He indicates his symptoms are very similar to the intermittent, short-lived dizziness he had been experiencing in the past which was diagnosed as BPPV and responded well to PT.  He is requesting referral to physical therapy today to return for treatment.  Referral sent to physical therapy office where he had worked out previously for patient to proceed with treatment

## 2022-09-23 NOTE — Patient Instructions (Signed)
  Medication Instructions:  Your physician recommends that you continue on your current medications as directed. Please refer to the Current Medication list given to you today. --If you need a refill on any your medications before your next appointment, please call your pharmacy first. If no refills are authorized on file call the office.-- Lab Work: Your physician has recommended that you have lab work today: No If you have labs (blood work) drawn today and your tests are completely normal, you will receive your results via MyChart message OR a phone call from our staff.  Please ensure you check your voicemail in the event that you authorized detailed messages to be left on a delegated number. If you have any lab test that is abnormal or we need to change your treatment, we will call you to review the results.  Referrals/Procedures/Imaging: No  Follow-Up: Your next appointment:   Your physician recommends that you schedule a follow-up appointment in: 4-6 months with Dr. de [place].  You will receive a text message or e-mail with a link to a survey about your care and experience with us today! We would greatly appreciate your feedback!   Thanks for letting us be apart of your health journey!!  Primary Care and Sports Medicine   Dr. Raymond de Cuba   We encourage you to activate your patient portal called "MyChart".  Sign up information is provided on this After Visit Summary.  MyChart is used to connect with patients for Virtual Visits (Telemedicine).  Patients are able to view lab/test results, encounter notes, upcoming appointments, etc.  Non-urgent messages can be sent to your provider as well. To learn more about what you can do with MyChart, please visit --  [email or web address].    

## 2022-10-10 ENCOUNTER — Encounter (HOSPITAL_BASED_OUTPATIENT_CLINIC_OR_DEPARTMENT_OTHER): Payer: Self-pay | Admitting: Family Medicine

## 2022-10-14 ENCOUNTER — Inpatient Hospital Stay: Payer: MEDICARE | Attending: Oncology

## 2022-10-14 DIAGNOSIS — C9111 Chronic lymphocytic leukemia of B-cell type in remission: Secondary | ICD-10-CM | POA: Insufficient documentation

## 2022-10-19 ENCOUNTER — Encounter: Payer: Self-pay | Admitting: *Deleted

## 2022-10-19 ENCOUNTER — Inpatient Hospital Stay: Payer: MEDICARE

## 2022-10-19 DIAGNOSIS — C9110 Chronic lymphocytic leukemia of B-cell type not having achieved remission: Secondary | ICD-10-CM

## 2022-10-19 DIAGNOSIS — Z7901 Long term (current) use of anticoagulants: Secondary | ICD-10-CM

## 2022-10-19 DIAGNOSIS — C9111 Chronic lymphocytic leukemia of B-cell type in remission: Secondary | ICD-10-CM | POA: Diagnosis present

## 2022-10-19 LAB — CBC WITH DIFFERENTIAL (CANCER CENTER ONLY)
Abs Immature Granulocytes: 0.03 10*3/uL (ref 0.00–0.07)
Basophils Absolute: 0 10*3/uL (ref 0.0–0.1)
Basophils Relative: 1 %
Eosinophils Absolute: 0.4 10*3/uL (ref 0.0–0.5)
Eosinophils Relative: 7 %
HCT: 44.6 % (ref 39.0–52.0)
Hemoglobin: 15 g/dL (ref 13.0–17.0)
Immature Granulocytes: 1 %
Lymphocytes Relative: 33 %
Lymphs Abs: 1.9 10*3/uL (ref 0.7–4.0)
MCH: 31.3 pg (ref 26.0–34.0)
MCHC: 33.6 g/dL (ref 30.0–36.0)
MCV: 93.1 fL (ref 80.0–100.0)
Monocytes Absolute: 0.5 10*3/uL (ref 0.1–1.0)
Monocytes Relative: 10 %
Neutro Abs: 2.8 10*3/uL (ref 1.7–7.7)
Neutrophils Relative %: 48 %
Platelet Count: 99 10*3/uL — ABNORMAL LOW (ref 150–400)
RBC: 4.79 MIL/uL (ref 4.22–5.81)
RDW: 12.8 % (ref 11.5–15.5)
WBC Count: 5.6 10*3/uL (ref 4.0–10.5)
nRBC: 0 % (ref 0.0–0.2)

## 2022-10-19 LAB — PROTIME-INR
INR: 2.3 — ABNORMAL HIGH (ref 0.8–1.2)
Prothrombin Time: 25.5 seconds — ABNORMAL HIGH (ref 11.4–15.2)

## 2022-11-15 ENCOUNTER — Inpatient Hospital Stay: Payer: MEDICARE

## 2022-11-15 ENCOUNTER — Other Ambulatory Visit (HOSPITAL_BASED_OUTPATIENT_CLINIC_OR_DEPARTMENT_OTHER): Payer: Self-pay

## 2022-11-15 ENCOUNTER — Inpatient Hospital Stay: Payer: MEDICARE | Attending: Oncology | Admitting: Oncology

## 2022-11-15 VITALS — BP 138/76 | HR 75 | Temp 98.1°F | Resp 20 | Ht 71.0 in | Wt 204.2 lb

## 2022-11-15 DIAGNOSIS — Z7901 Long term (current) use of anticoagulants: Secondary | ICD-10-CM | POA: Diagnosis not present

## 2022-11-15 DIAGNOSIS — C9110 Chronic lymphocytic leukemia of B-cell type not having achieved remission: Secondary | ICD-10-CM

## 2022-11-15 DIAGNOSIS — Z79899 Other long term (current) drug therapy: Secondary | ICD-10-CM | POA: Diagnosis not present

## 2022-11-15 DIAGNOSIS — N529 Male erectile dysfunction, unspecified: Secondary | ICD-10-CM | POA: Insufficient documentation

## 2022-11-15 DIAGNOSIS — N289 Disorder of kidney and ureter, unspecified: Secondary | ICD-10-CM | POA: Insufficient documentation

## 2022-11-15 DIAGNOSIS — D696 Thrombocytopenia, unspecified: Secondary | ICD-10-CM | POA: Diagnosis not present

## 2022-11-15 DIAGNOSIS — C9111 Chronic lymphocytic leukemia of B-cell type in remission: Secondary | ICD-10-CM | POA: Diagnosis present

## 2022-11-15 DIAGNOSIS — Z9221 Personal history of antineoplastic chemotherapy: Secondary | ICD-10-CM | POA: Insufficient documentation

## 2022-11-15 LAB — CBC WITH DIFFERENTIAL (CANCER CENTER ONLY)
Abs Immature Granulocytes: 0.05 10*3/uL (ref 0.00–0.07)
Basophils Absolute: 0 10*3/uL (ref 0.0–0.1)
Basophils Relative: 0 %
Eosinophils Absolute: 0.3 10*3/uL (ref 0.0–0.5)
Eosinophils Relative: 4 %
HCT: 44.7 % (ref 39.0–52.0)
Hemoglobin: 15 g/dL (ref 13.0–17.0)
Immature Granulocytes: 1 %
Lymphocytes Relative: 32 %
Lymphs Abs: 2.5 10*3/uL (ref 0.7–4.0)
MCH: 31 pg (ref 26.0–34.0)
MCHC: 33.6 g/dL (ref 30.0–36.0)
MCV: 92.4 fL (ref 80.0–100.0)
Monocytes Absolute: 0.8 10*3/uL (ref 0.1–1.0)
Monocytes Relative: 11 %
Neutro Abs: 4 10*3/uL (ref 1.7–7.7)
Neutrophils Relative %: 52 %
Platelet Count: 108 10*3/uL — ABNORMAL LOW (ref 150–400)
RBC: 4.84 MIL/uL (ref 4.22–5.81)
RDW: 12.5 % (ref 11.5–15.5)
WBC Count: 7.7 10*3/uL (ref 4.0–10.5)
nRBC: 0 % (ref 0.0–0.2)

## 2022-11-15 LAB — PROTIME-INR
INR: 2 — ABNORMAL HIGH (ref 0.8–1.2)
Prothrombin Time: 22.4 seconds — ABNORMAL HIGH (ref 11.4–15.2)

## 2022-11-15 MED ORDER — AREXVY 120 MCG/0.5ML IM SUSR
INTRAMUSCULAR | 0 refills | Status: DC
  Filled 2022-11-15: qty 0.5, 1d supply, fill #0

## 2022-11-15 NOTE — Progress Notes (Signed)
[place] OFFICE PROGRESS NOTE   Diagnosis: CLL, chronic anticoagulation  INTERVAL HISTORY:   Mr. Matsuo returns as scheduled.  He feels well.  No fever, night sweats, bleeding, or symptom of thrombosis.  No abdominal pain.  Objective:  Vital signs in last 24 hours:  Blood pressure 138/76, pulse 75, temperature 98.1 F (36.7 C), temperature source Oral, resp. rate 20, height _0  (1.803 m), weight 204 lb 3.2 oz (92.6 kg), SpO2 100 %.    Lymphatics: No cervical, supraclavicular, axillary, or inguinal nodes Resp: Lungs clear bilaterally Cardio: Regular rate and rhythm GI: No hepatosplenomegaly, firm masslike fullness in the mid abdomen superior to the umbilicus, nontender Vascular: Chronic stasis change at the right greater than left lower leg   Lab Results:  Lab Results  Component Value Date   WBC 7.7 11/15/2022   HGB 15.0 11/15/2022   HCT 44.7 11/15/2022   MCV 92.4 11/15/2022   PLT 108 (L) 11/15/2022   NEUTROABS 4.0 11/15/2022    CMP  Lab Results  Component Value Date   NA 141 02/25/2021   K 4.7 02/25/2021   CL 102 02/25/2021   CO2 29 02/25/2021   GLUCOSE 76 02/25/2021   BUN 31 (H) 02/25/2021   CREATININE 1.61 (H) 02/25/2021   CALCIUM 9.0 02/25/2021   PROT 7.4 02/25/2021   ALBUMIN 4.1 02/25/2021   AST 18 02/25/2021   ALT 16 02/25/2021   ALKPHOS 44 02/25/2021   BILITOT 0.5 02/25/2021   GFRNONAA 44 (L) 02/25/2021   GFRAA 52 (L) 08/13/2020    No results found for: "CEA1", "CEA", "HQP591", "CA125"  Lab Results  Component Value Date   INR 2.0 (H) 11/15/2022   LABPROT 22.4 (H) 11/15/2022    Imaging:  No results found.  Medications: I have reviewed the patient's current medications.   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 He will return for a monthly PT check.  Indwelling IVC catheter.  Chronic stasis change of the low legs, on the right greater than  left.        4. 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 erectile dysfunction-followed by Dr. Jeffie Pollock , status post placement of a penile implant at [facility] in April 2014            7. CT of the abdomen/pelvis at [facility] urology February 2014 for evaluation of hematuria-12 mm external iliac nodes and left periaortic retroperitoneal lymph node-11 mm-potentially related to CLL      8.  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, peripheral lymphocytosis and small palpable lymph nodes          CT abdomen/pelvis 09/18/2019-extensive abdominal pelvic lymphadenopathy including a left periaortic node measuring 5.7 cm 08/13/2020 CLL FISH panel-no evidence of trisomy 12; no evidence of D13S319; no evidence of p53 deletion or amplification; deletion of ATM present 11/05/2020-peripheral blood TP53 mutation-negative Cycle 1 bendamustine/Rituxan 10/08/2020, 10/09/2020 Cycle 2 bendamustine/Rituxan 11/05/2020, 11/06/2020 Cycle 3 bendamustine/Rituxan 12/03/2020, 12/04/2020 Cycle 4 bendamustine/Rituxan 12/31/2020, 01/01/2021 CTs 01/19/2021-decrease in bulky abdomen/pelvic lymphadenopathy with more widespread ill-defined "haziness "in the mesentery and retroperitoneal fat, decreased pelvic lymphadenopathy Cycle 5 bendamustine/Rituxan 01/28/2021, 01/29/2021 Cycle 6 bendamustine/rituximab 02/25/2021, 02/26/2021     9.  Basal cell carcinoma removed from the left superior central forehead 01/21/2020    10.  Renal insufficiency     Disposition: Mr. Tolan remains in clinical remission from CLL.  There is a persistent firm fullness in the mid upper abdomen, potentially related to residual abdominal lymphadenopathy.  He will call for increased fullness or development of pain.  He will continue Coumadin at current dose.  The PT/INR is therapeutic.  Mr. Maus plans to obtain an RSV vaccine today.  He will return for a monthly lab visit.  He will  be scheduled for an office visit in 4 months.  Betsy Coder, MD  11/15/2022  12:34 PM

## 2022-12-01 ENCOUNTER — Other Ambulatory Visit: Payer: Self-pay | Admitting: Internal Medicine

## 2022-12-17 ENCOUNTER — Inpatient Hospital Stay: Payer: MEDICARE | Attending: Oncology

## 2022-12-17 ENCOUNTER — Telehealth: Payer: Self-pay

## 2022-12-17 DIAGNOSIS — C9110 Chronic lymphocytic leukemia of B-cell type not having achieved remission: Secondary | ICD-10-CM

## 2022-12-17 DIAGNOSIS — Z7901 Long term (current) use of anticoagulants: Secondary | ICD-10-CM

## 2022-12-17 DIAGNOSIS — C9111 Chronic lymphocytic leukemia of B-cell type in remission: Secondary | ICD-10-CM | POA: Diagnosis not present

## 2022-12-17 DIAGNOSIS — Z86718 Personal history of other venous thrombosis and embolism: Secondary | ICD-10-CM | POA: Diagnosis not present

## 2022-12-17 LAB — CBC WITH DIFFERENTIAL (CANCER CENTER ONLY)
Abs Immature Granulocytes: 0.05 10*3/uL (ref 0.00–0.07)
Basophils Absolute: 0.1 10*3/uL (ref 0.0–0.1)
Basophils Relative: 1 %
Eosinophils Absolute: 0.4 10*3/uL (ref 0.0–0.5)
Eosinophils Relative: 5 %
HCT: 45.4 % (ref 39.0–52.0)
Hemoglobin: 15.3 g/dL (ref 13.0–17.0)
Immature Granulocytes: 1 %
Lymphocytes Relative: 31 %
Lymphs Abs: 2.4 10*3/uL (ref 0.7–4.0)
MCH: 31.3 pg (ref 26.0–34.0)
MCHC: 33.7 g/dL (ref 30.0–36.0)
MCV: 92.8 fL (ref 80.0–100.0)
Monocytes Absolute: 0.7 10*3/uL (ref 0.1–1.0)
Monocytes Relative: 10 %
Neutro Abs: 4 10*3/uL (ref 1.7–7.7)
Neutrophils Relative %: 52 %
Platelet Count: 96 10*3/uL — ABNORMAL LOW (ref 150–400)
RBC: 4.89 MIL/uL (ref 4.22–5.81)
RDW: 12.8 % (ref 11.5–15.5)
WBC Count: 7.6 10*3/uL (ref 4.0–10.5)
nRBC: 0 % (ref 0.0–0.2)

## 2022-12-17 LAB — PROTIME-INR
INR: 2.6 — ABNORMAL HIGH (ref 0.8–1.2)
Prothrombin Time: 27.5 s — ABNORMAL HIGH (ref 11.4–15.2)

## 2022-12-17 NOTE — Telephone Encounter (Signed)
Patient gave verbal understanding and had no further questions or concerns  

## 2022-12-17 NOTE — Telephone Encounter (Signed)
-----  Message from Ladell Pier, MD sent at 12/17/2022  1:37 PM EST ----- Please call patient, same Coumadin dose, CBC is stable, follow-up as scheduled

## 2022-12-25 ENCOUNTER — Encounter: Payer: Self-pay | Admitting: Oncology

## 2022-12-27 ENCOUNTER — Other Ambulatory Visit: Payer: Self-pay | Admitting: *Deleted

## 2022-12-27 MED ORDER — WARFARIN SODIUM 2.5 MG PO TABS: 2.5000 mg | ORAL_TABLET | Freq: Every day | ORAL | 3 refills | Status: DC

## 2022-12-27 MED ORDER — WARFARIN SODIUM 2.5 MG PO TABS: 2.5000 mg | ORAL_TABLET | Freq: Every day | ORAL | 2 refills | Status: DC

## 2022-12-27 NOTE — Progress Notes (Signed)
[name] requested refill on his warfarin 2.5 mg

## 2023-01-04 ENCOUNTER — Encounter: Payer: Self-pay | Admitting: Oncology

## 2023-01-06 ENCOUNTER — Other Ambulatory Visit: Payer: Self-pay | Admitting: Oncology

## 2023-01-06 DIAGNOSIS — Z7901 Long term (current) use of anticoagulants: Secondary | ICD-10-CM

## 2023-01-17 ENCOUNTER — Inpatient Hospital Stay: Payer: MEDICARE | Attending: Oncology

## 2023-01-17 DIAGNOSIS — C9110 Chronic lymphocytic leukemia of B-cell type not having achieved remission: Secondary | ICD-10-CM

## 2023-01-17 DIAGNOSIS — Z7901 Long term (current) use of anticoagulants: Secondary | ICD-10-CM | POA: Diagnosis not present

## 2023-01-17 DIAGNOSIS — Z86718 Personal history of other venous thrombosis and embolism: Secondary | ICD-10-CM | POA: Diagnosis not present

## 2023-01-17 DIAGNOSIS — C9111 Chronic lymphocytic leukemia of B-cell type in remission: Secondary | ICD-10-CM | POA: Insufficient documentation

## 2023-01-17 LAB — CBC WITH DIFFERENTIAL (CANCER CENTER ONLY)
Abs Immature Granulocytes: 0.05 10*3/uL (ref 0.00–0.07)
Basophils Absolute: 0 10*3/uL (ref 0.0–0.1)
Basophils Relative: 1 %
Eosinophils Absolute: 0.4 10*3/uL (ref 0.0–0.5)
Eosinophils Relative: 5 %
HCT: 45.4 % (ref 39.0–52.0)
Hemoglobin: 15.3 g/dL (ref 13.0–17.0)
Immature Granulocytes: 1 %
Lymphocytes Relative: 34 %
Lymphs Abs: 2.6 10*3/uL (ref 0.7–4.0)
MCH: 31.1 pg (ref 26.0–34.0)
MCHC: 33.7 g/dL (ref 30.0–36.0)
MCV: 92.3 fL (ref 80.0–100.0)
Monocytes Absolute: 0.7 10*3/uL (ref 0.1–1.0)
Monocytes Relative: 10 %
Neutro Abs: 3.9 10*3/uL (ref 1.7–7.7)
Neutrophils Relative %: 49 %
Platelet Count: 90 10*3/uL — ABNORMAL LOW (ref 150–400)
RBC: 4.92 MIL/uL (ref 4.22–5.81)
RDW: 13 % (ref 11.5–15.5)
WBC Count: 7.6 10*3/uL (ref 4.0–10.5)
nRBC: 0 % (ref 0.0–0.2)

## 2023-01-17 LAB — PROTIME-INR
INR: 1.9 — ABNORMAL HIGH (ref 0.8–1.2)
Prothrombin Time: 21.8 seconds — ABNORMAL HIGH (ref 11.4–15.2)

## 2023-01-18 ENCOUNTER — Encounter: Payer: Self-pay | Admitting: *Deleted

## 2023-01-24 ENCOUNTER — Telehealth: Payer: Self-pay | Admitting: Family Medicine

## 2023-01-24 NOTE — Telephone Encounter (Signed)
Called patient to schedule Medicare Annual Wellness Visit (AWV). Left message for patient to call back and schedule Medicare Annual Wellness Visit (AWV).  Last date of AWV: AWVI eligible as of 10/29/2016   Please schedule an appointment at any time with [place], [facility].  If any questions, please contact me at [phone].    Thank you,  [place] Direct dial  [phone]

## 2023-01-26 ENCOUNTER — Encounter (HOSPITAL_BASED_OUTPATIENT_CLINIC_OR_DEPARTMENT_OTHER): Payer: Self-pay | Admitting: Family Medicine

## 2023-01-26 ENCOUNTER — Telehealth: Payer: Self-pay | Admitting: Family Medicine

## 2023-01-26 NOTE — Telephone Encounter (Signed)
[place] to schedule their annual wellness visit. Appointment made for 02/08/2023.  Thank you,  [place] Direct dial  [phone]

## 2023-02-02 ENCOUNTER — Encounter: Payer: Self-pay | Admitting: Oncology

## 2023-02-03 ENCOUNTER — Telehealth: Payer: Self-pay

## 2023-02-03 NOTE — Telephone Encounter (Signed)
Received phone call from Coral Ceo [phone]) Nurse Navigator for Dr. Jeanie Sewer office at [facility].  Patient was seen at their clinic on 02/02/23 and physician wanted to know if Dr. 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  Dr. Benay Spice made aware of Dr. Bethel Born request and stated he was in agreement but we would need to know when the physician would like him started on the treatment.  Message left with Nurse Navigator [place] requesting for them to contact our office regarding a possible start date for treatment.

## 2023-02-06 ENCOUNTER — Encounter (HOSPITAL_BASED_OUTPATIENT_CLINIC_OR_DEPARTMENT_OTHER): Payer: Self-pay | Admitting: Family Medicine

## 2023-02-08 ENCOUNTER — Encounter: Payer: Self-pay | Admitting: Oncology

## 2023-02-08 ENCOUNTER — Encounter (HOSPITAL_BASED_OUTPATIENT_CLINIC_OR_DEPARTMENT_OTHER): Payer: Self-pay

## 2023-02-08 ENCOUNTER — Ambulatory Visit (INDEPENDENT_AMBULATORY_CARE_PROVIDER_SITE_OTHER): Payer: MEDICARE

## 2023-02-08 VITALS — Ht 71.0 in | Wt 200.0 lb

## 2023-02-08 DIAGNOSIS — Z0000 Encounter for general adult medical examination without abnormal findings: Secondary | ICD-10-CM | POA: Diagnosis not present

## 2023-02-08 NOTE — Progress Notes (Signed)
Subjective:   Chad Gross is a 77 y.o. male who presents for Medicare Annual/Subsequent preventive examination.  Review of Systems    Virtual Visit via Telephone Note  I connected with  Chad Gross on 02/08/23 at 10:30 AM EDT by telephone and verified that I am speaking with the correct person using two identifiers.  Location: Patient: Home Provider: Office Persons participating in the virtual visit: patient/Nurse Health Advisor   I discussed the limitations, risks, security and privacy concerns of performing an evaluation and management service by telephone and the availability of in person appointments. The patient expressed understanding and agreed to proceed.  Interactive audio and video telecommunications were attempted between this nurse and patient, however failed, due to patient having technical difficulties OR patient did not have access to video capability.  We continued and completed visit with audio only.  Some vital signs may be absent or patient reported.   Criselda Peaches, LPN  Cardiac Risk Factors include: advanced age (>51mn, >>68women);hypertension;male gender     Objective:    Today's Vitals   02/08/23 1034  Weight: 200 lb (90.7 kg)  Height: '5\' 11"'$  (1.803 m)   Body mass index is 27.89 kg/m.     02/08/2023   10:41 AM 11/15/2022   12:07 PM 08/17/2021   12:18 PM 01/01/2021    5:05 PM 12/31/2020   12:59 PM 12/03/2020   12:17 PM 11/06/2020    9:29 AM  Advanced Directives  Does Patient Have a Medical Advance Directive? Yes Yes Yes Yes Yes Yes Yes  Type of AParamedicof ABrick CenterLiving will HMobileLiving will HSpring ValleyLiving will HSatellite BeachLiving will HKenesawLiving will HBarodaLiving will HMukwonagoLiving will  Does patient want to make changes to medical advance directive? No - Patient declined No -  Patient declined No - Patient declined No - Patient declined No - Patient declined No - Patient declined No - Patient declined  Copy of HMilfordin Chart? Yes - validated most recent copy scanned in chart (See row information) Yes - validated most recent copy scanned in chart (See row information)  Yes - validated most recent copy scanned in chart (See row information) Yes - validated most recent copy scanned in chart (See row information) Yes - validated most recent copy scanned in chart (See row information) Yes - validated most recent copy scanned in chart (See row information)    Current Medications (verified) Outpatient Encounter Medications as of 02/08/2023  Medication Sig   acetaminophen (TYLENOL) 500 MG tablet Take 500-1,000 mg by mouth every 6 (six) hours as needed for moderate pain or headache.   Calcium-Magnesium-Zinc [id number] TABS Take by mouth.   Coenzyme Q10 (COQ10) 100 MG CAPS Take 100 mg by mouth daily.   COVID-19 mRNA vaccine 2023-2024 (COMIRNATY) syringe Inject into the muscle.   GLUCOSAMINE CHONDROITIN COMPLX PO Take 1 tablet by mouth daily.   hydrochlorothiazide (HYDRODIURIL) 12.5 MG tablet Take 1 tablet (12.5 mg total) by mouth daily.   hydrocortisone (ANUSOL-HC) 2.5 % rectal cream PLACE 1 APPLICATION RECTALLY 2 (TWO) TIMES DAILY.   influenza vaccine adjuvanted (FLUAD QUADRIVALENT) 0.5 ML injection Inject into the muscle.   losartan (COZAAR) 50 MG tablet Take 1 tablet (50 mg total) by mouth daily.   meclizine (ANTIVERT) 12.5 MG tablet Take 1 tablet (12.5 mg total) by mouth 3 (three) times daily as needed for  dizziness. (Patient not taking: Reported on 11/15/2022)   Multiple Vitamins-Minerals (SENIOR MULTIVITAMIN PLUS PO) Take 1 tablet by mouth at bedtime.   pravastatin (PRAVACHOL) 40 MG tablet Take 1 tablet (40 mg total) by mouth daily.   PRESCRIPTION MEDICATION Apply topically 2 (two) times daily. Fluorouracil 5% + Calcipotriene 0.005%--apply to face  (Patient not taking: Reported on 11/15/2022)   prochlorperazine (COMPAZINE) 5 MG tablet Take 1 tablet (5 mg total) by mouth every 6 (six) hours as needed for nausea or vomiting. (Patient not taking: Reported on 11/15/2022)   RSV vaccine recomb adjuvanted (AREXVY) 120 MCG/0.5ML injection Inject into the muscle.   traMADol (ULTRAM) 50 MG tablet Take 1 tablet (50 mg total) by mouth every 6 (six) hours as needed for moderate pain or severe pain.   triamcinolone acetonide 40 MG/ML SUSP 40 mg, mupirocin cream 2 % CREA 15 g Apply 1 application topically as needed. (Patient not taking: Reported on 11/15/2022)   warfarin (COUMADIN) 10 MG tablet TAKE 1 TABLET BY MOUTH DAILY AT 6 PM   warfarin (COUMADIN) 2.5 MG tablet Take 1 tablet (2.5 mg total) by mouth daily. Take with 10 mg tablet on Monday and Thursday for 12.5 mg dose   No facility-administered encounter medications on file as of 02/08/2023.    Allergies (verified) Patient has no known allergies.   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   ED (erectile dysfunction)    History of DVT (deep vein thrombosis)    Hyperlipidemia    Hypertension    Pericarditis    Pulmonary embolism [facility])    Past Surgical History:  Procedure Laterality Date   CARDIAC CATHETERIZATION  01/21/2011   EF 50-55%   [place] FLITER PLACEMENT     INGUINAL HERNIA REPAIR     BILATERAL   KNEE ARTHROSCOPY Right 05/04/2018   Procedure: ARTHROSCOPY KNEE, PARTIAL MENISCECTOMY AND CHONDROPLASTY;  Surgeon: Melrose Nakayama, MD;  Location: [facility];  Service: Orthopedics;  Laterality: Right;   Family History  Problem Relation Age of Onset   Breast cancer Mother    Hypertension Mother    Heart failure Father    Pneumonia Father    Hypertension Father    Social History   Socioeconomic History   Marital status: Divorced    Spouse name: Not on file   Number of children: Not on  file   Years of education: Not on file   Highest education level: Not on file  Occupational History   Not on file  Tobacco Use   Smoking status: Never   Smokeless tobacco: Never  Substance and Sexual Activity   Alcohol use: Yes    Comment: nightly   Drug use: No   Sexual activity: Not on file  Other Topics Concern   Not on file  Social History Narrative   Not on file   Social Determinants of Health   Financial Resource Strain: Low Risk  (02/08/2023)   Overall Financial Resource Strain (CARDIA)    Difficulty of Paying Living Expenses: Not hard at all  Food Insecurity: No Food Insecurity (02/08/2023)   Hunger Vital Sign    Worried About Running Out of Food in the Last Year: Never true    [place] in the Last Year: Never true  Transportation Needs: No Transportation Needs (02/08/2023)   PRAPARE - Hydrologist (Medical): No    Lack of Transportation (  Non-Medical): No  Physical Activity: Sufficiently Active (02/08/2023)   Exercise Vital Sign    Days of Exercise per Week: 7 days    Minutes of Exercise per Session: 30 min  Stress: No Stress Concern Present (02/08/2023)   [place]    Feeling of Stress : Not at all  Social Connections: Moderately Isolated (02/08/2023)   Social Connection and Isolation Panel [NHANES]    Frequency of Communication with Friends and Family: More than three times a week    Frequency of Social Gatherings with Friends and Family: More than three times a week    Attends Religious Services: Never    Marine scientist or Organizations: Yes    Attends Music therapist: More than 4 times per year    Marital Status: Divorced    Tobacco Counseling Counseling given: Not Answered   Clinical Intake:  Pre-visit preparation completed: Yes  Pain : No/denies pain     BMI - recorded: 27.89 Nutritional Risks: None Diabetes: No  How often  do you need to have someone help you when you read instructions, pamphlets, or other written materials from your doctor or pharmacy?: 1 - Never  Diabetic?  No  Interpreter Needed?: No  Information entered by :: Rolene Arbour LPN   Activities of Daily Living    02/08/2023   10:39 AM 02/04/2023   11:37 AM  In your present state of health, do you have any difficulty performing the following activities:  Hearing? 0 0  Vision? 0 0  Difficulty concentrating or making decisions? 0 0  Walking or climbing stairs? 0 0  Dressing or bathing? 0 0  Doing errands, shopping? 0 0  Preparing Food and eating ? N N  Using the Toilet? N N  In the past six months, have you accidently leaked urine? N N  Do you have problems with loss of bowel control? N N  Managing your Medications? N N  Managing your Finances? N N  Housekeeping or managing your Housekeeping? N N    Patient Care Team: [place], Blondell Reveal, MD as PCP - General (Family Medicine)  Indicate any recent Medical Services you may have received from other than [facility] providers in the past year (date may be approximate).     Assessment:   This is a routine wellness examination for [place].  Hearing/Vision screen Hearing Screening - Comments:: Denies hearing difficulties   Vision Screening - Comments:: Wears rx glasses - up to date with routine eye exams with  [place] issues and exercise activities discussed: Current Exercise Habits: Home exercise routine, Type of exercise: walking, Time (Minutes): 30, Frequency (Times/Week): 7, Weekly Exercise (Minutes/Week): 210, Intensity: Moderate, Exercise limited by: None identified   Goals Addressed               This Visit's Progress     No current goals (pt-stated)         Depression Screen    02/08/2023   10:38 AM 09/23/2022   10:11 AM 03/25/2022   10:04 AM 03/25/2021   11:02 AM 02/20/2020    2:02 PM 02/16/2019    2:49 PM 02/16/2019    1:00 PM  PHQ 2/9 Scores   PHQ - 2 Score 0 0 0 0 0 0 0  PHQ- 9 Score  1     0  Exception Documentation  Medical reason Medical reason        Fall  Risk    02/08/2023   10:40 AM 02/04/2023   11:37 AM 09/23/2022   10:11 AM 03/25/2022   10:04 AM 03/25/2021   11:02 AM  Fall Risk   Falls in the past year? 0 0 0 0 0  Number falls in past yr: 0 0 0 0   Injury with Fall? 0 0 0 0   Risk for fall due to : No Fall Risks  No Fall Risks No Fall Risks   Follow up Falls prevention discussed  Falls evaluation completed Falls evaluation completed     [place]:  Any stairs in or around the home? No  If so, are there any without handrails? No  Home free of loose throw rugs in walkways, pet beds, electrical cords, etc? Yes  Adequate lighting in your home to reduce risk of falls? Yes   ASSISTIVE DEVICES UTILIZED TO PREVENT FALLS:  Life alert? No  Use of a cane, walker or w/c? No  Grab bars in the bathroom? No  Shower chair or bench in shower? No  Elevated toilet seat or a handicapped toilet? No   TIMED UP AND GO:  Was the test performed? No . Audio Visit   Cognitive Function:        02/08/2023   10:41 AM  6CIT Screen  What Year? 0 points  What month? 0 points  What time? 0 points  Count back from 20 0 points  Months in reverse 0 points  Repeat phrase 0 points  Total Score 0 points    Immunizations Immunization History  Administered Date(s) Administered   COVID-19, mRNA, vaccine(Comirnaty)12 years and older 09/23/2022   Fluad Quad(high Dose 65+) 08/15/2019, 08/13/2020, 09/24/2021, 08/16/2022   Influenza, High Dose Seasonal PF 08/15/2018   Influenza,inj,quad, With Preservative 10/17/2013, 08/30/2014, 08/30/2015, 10/14/2016, 08/17/2017   PFIZER Comirnaty(Gray Top)Covid-19 Tri-Sucrose Vaccine 03/25/2021   PFIZER(Purple Top)SARS-COV-2 Vaccination 12/23/2019, 01/14/2020, 07/22/2020   Pneumococcal Conjugate-13 05/19/2015, 01/06/2018   Pneumococcal Polysaccharide-23  11/29/2009, 12/13/2018   Respiratory Syncytial Virus Vaccine,Recomb Aduvanted(Arexvy) 11/15/2022   Tdap 05/19/2015   Zoster Recombinat (Shingrix) 06/19/2019, 09/12/2019   Zoster, Live 11/30/2011    TDAP status: Up to date  Flu Vaccine status: Up to date  Pneumococcal vaccine status: Up to date  Covid-19 vaccine status: Completed vaccines  Qualifies for Shingles Vaccine? Yes   Zostavax completed Yes   Shingrix Completed?: Yes  Screening Tests Health Maintenance  Topic Date Due   COVID-19 Vaccine (6 - 2023-24 season) 02/24/2023 (Originally 11/18/2022)   Medicare Annual Wellness (AWV)  02/08/2024   DTaP/Tdap/Td (2 - Td or Tdap) 05/18/2025   Pneumonia Vaccine 50+ Years old  Completed   INFLUENZA VACCINE  Completed   Hepatitis C Screening  Completed   Zoster Vaccines- Shingrix  Completed   HPV VACCINES  Aged Out   COLONOSCOPY (Pts 45-110yr Insurance coverage will need to be confirmed)  Discontinued    Health Maintenance  There are no preventive care reminders to display for this patient.   Colorectal cancer screening: No longer required.   Lung Cancer Screening: (Low Dose CT Chest recommended if Age 78-80years, 30 pack-year currently smoking OR have quit w/in 15years.) does not qualify.     Additional Screening:  Hepatitis C Screening: does qualify; Completed 02/07/18  Vision Screening: Recommended annual ophthalmology exams for early detection of glaucoma and other disorders of the eye. Is the patient up to date with their annual eye exam?  Yes  Who is the provider  or what is the name of the office in which the patient attends annual eye exams? [facility] If pt is not established with a provider, would they like to be referred to a provider to establish care? No .   Dental Screening: Recommended annual dental exams for proper oral hygiene  Community Resource Referral / Chronic Care Management:  CRR required this visit?  No   CCM required this visit?   No      Plan:     I have personally reviewed and noted the following in the patient's chart:   Medical and social history Use of alcohol, tobacco or illicit drugs  Current medications and supplements including opioid prescriptions. Patient is not currently taking opioid prescriptions. Functional ability and status Nutritional status Physical activity Advanced directives List of other physicians Hospitalizations, surgeries, and ER visits in previous 12 months Vitals Screenings to include cognitive, depression, and falls Referrals and appointments  In addition, I have reviewed and discussed with patient certain preventive protocols, quality metrics, and best practice recommendations. A written personalized care plan for preventive services as well as general preventive health recommendations were provided to patient.     Criselda Peaches, LPN   [id number]   Nurse Notes: None

## 2023-02-08 NOTE — Patient Instructions (Addendum)
[name] , Thank you for taking time to come for your Medicare Wellness Visit. I appreciate your ongoing commitment to your health goals. Please review the following plan we discussed and let me know if I can assist you in the future.   These are the goals we discussed:  Goals       No current goals (pt-stated)        This is a list of the screening recommended for you and due dates:  Health Maintenance  Topic Date Due   COVID-19 Vaccine (6 - 2023-24 season) 02/24/2023*   Medicare Annual Wellness Visit  02/08/2024   DTaP/Tdap/Td vaccine (2 - Td or Tdap) 05/18/2025   Pneumonia Vaccine  Completed   Flu Shot  Completed   Hepatitis C Screening: USPSTF Recommendation to screen - Ages 78-79 yo.  Completed   Zoster (Shingles) Vaccine  Completed   HPV Vaccine  Aged Out   Colon Cancer Screening  Discontinued  *Topic was postponed. The date shown is not the original due date.    Advanced directives: In Chart  Conditions/risks identified: None  Next appointment: Follow up in one year for your annual wellness visit.   Preventive Care 78 Years and Older, Male  Preventive care refers to lifestyle choices and visits with your health care provider that can promote health and wellness. What does preventive care include? A yearly physical exam. This is also called an annual well check. Dental exams once or twice a year. Routine eye exams. Ask your health care provider how often you should have your eyes checked. Personal lifestyle choices, including: Daily care of your teeth and gums. Regular physical activity. Eating a healthy diet. Avoiding tobacco and drug use. Limiting alcohol use. Practicing safe sex. Taking low doses of aspirin every day. Taking vitamin and mineral supplements as recommended by your health care provider. What happens during an annual well check? The services and screenings done by your health care provider during your annual well check will depend on your age,  overall health, lifestyle risk factors, and family history of disease. Counseling  Your health care provider may ask you questions about your: Alcohol use. Tobacco use. Drug use. Emotional well-being. Home and relationship well-being. Sexual activity. Eating habits. History of falls. Memory and ability to understand (cognition). Work and work Statistician. Screening  You may have the following tests or measurements: Height, weight, and BMI. Blood pressure. Lipid and cholesterol levels. These may be checked every 5 years, or more frequently if you are over 78 years old. Skin check. Lung cancer screening. You may have this screening every year starting at age 78 if you have a 30-pack-year history of smoking and currently smoke or have quit within the past 15 years. Fecal occult blood test (FOBT) of the stool. You may have this test every year starting at age 78. Flexible sigmoidoscopy or colonoscopy. You may have a sigmoidoscopy every 5 years or a colonoscopy every 10 years starting at age 78. Prostate cancer screening. Recommendations will vary depending on your family history and other risks. Hepatitis C blood test. Hepatitis B blood test. Sexually transmitted disease (STD) testing. Diabetes screening. This is done by checking your blood sugar (glucose) after you have not eaten for a while (fasting). You may have this done every 1-3 years. Abdominal aortic aneurysm (AAA) screening. You may need this if you are a current or former smoker. Osteoporosis. You may be screened starting at age 78 if you are at high risk. Talk with  your health care provider about your test results, treatment options, and if necessary, the need for more tests. Vaccines  Your health care provider may recommend certain vaccines, such as: Influenza vaccine. This is recommended every year. Tetanus, diphtheria, and acellular pertussis (Tdap, Td) vaccine. You may need a Td booster every 10 years. Zoster vaccine.  You may need this after age 78. Pneumococcal 13-valent conjugate (PCV13) vaccine. One dose is recommended after age 78. Pneumococcal polysaccharide (PPSV23) vaccine. One dose is recommended after age 78. Talk to your health care provider about which screenings and vaccines you need and how often you need them. This information is not intended to replace advice given to you by your health care provider. Make sure you discuss any questions you have with your health care provider. Document Released: 12/12/2015 Document Revised: 08/04/2016 Document Reviewed: 09/16/2015 Elsevier Interactive Patient Education  2017 [employer] Prevention in the Home Falls can cause injuries. They can happen to people of all ages. There are many things you can do to make your home safe and to help prevent falls. What can I do on the outside of my home? Regularly fix the edges of walkways and driveways and fix any cracks. Remove anything that might make you trip as you walk through a door, such as a raised step or threshold. Trim any bushes or trees on the path to your home. Use bright outdoor lighting. Clear any walking paths of anything that might make someone trip, such as rocks or tools. Regularly check to see if handrails are loose or broken. Make sure that both sides of any steps have handrails. Any raised decks and porches should have guardrails on the edges. Have any leaves, snow, or ice cleared regularly. Use sand or salt on walking paths during winter. Clean up any spills in your garage right away. This includes oil or grease spills. What can I do in the bathroom? Use night lights. Install grab bars by the toilet and in the tub and shower. Do not use towel bars as grab bars. Use non-skid mats or decals in the tub or shower. If you need to sit down in the shower, use a plastic, non-slip stool. Keep the floor dry. Clean up any water that spills on the floor as soon as it happens. Remove soap  buildup in the tub or shower regularly. Attach bath mats securely with double-sided non-slip rug tape. Do not have throw rugs and other things on the floor that can make you trip. What can I do in the bedroom? Use night lights. Make sure that you have a light by your bed that is easy to reach. Do not use any sheets or blankets that are too big for your bed. They should not hang down onto the floor. Have a firm chair that has side arms. You can use this for support while you get dressed. Do not have throw rugs and other things on the floor that can make you trip. What can I do in the kitchen? Clean up any spills right away. Avoid walking on wet floors. Keep items that you use a lot in easy-to-reach places. If you need to reach something above you, use a strong step stool that has a grab bar. Keep electrical cords out of the way. Do not use floor polish or wax that makes floors slippery. If you must use wax, use non-skid floor wax. Do not have throw rugs and other things on the floor that can make you trip.  What can I do with my stairs? Do not leave any items on the stairs. Make sure that there are handrails on both sides of the stairs and use them. Fix handrails that are broken or loose. Make sure that handrails are as long as the stairways. Check any carpeting to make sure that it is firmly attached to the stairs. Fix any carpet that is loose or worn. Avoid having throw rugs at the top or bottom of the stairs. If you do have throw rugs, attach them to the floor with carpet tape. Make sure that you have a light switch at the top of the stairs and the bottom of the stairs. If you do not have them, ask someone to add them for you. What else can I do to help prevent falls? Wear shoes that: Do not have high heels. Have rubber bottoms. Are comfortable and fit you well. Are closed at the toe. Do not wear sandals. If you use a stepladder: Make sure that it is fully opened. Do not climb a closed  stepladder. Make sure that both sides of the stepladder are locked into place. Ask someone to hold it for you, if possible. Clearly mark and make sure that you can see: Any grab bars or handrails. First and last steps. Where the edge of each step is. Use tools that help you move around (mobility aids) if they are needed. These include: Canes. Walkers. Scooters. Crutches. Turn on the lights when you go into a dark area. Replace any light bulbs as soon as they burn out. Set up your furniture so you have a clear path. Avoid moving your furniture around. If any of your floors are uneven, fix them. If there are any pets around you, be aware of where they are. Review your medicines with your doctor. Some medicines can make you feel dizzy. This can increase your chance of falling. Ask your doctor what other things that you can do to help prevent falls. This information is not intended to replace advice given to you by your health care provider. Make sure you discuss any questions you have with your health care provider. Document Released: 09/11/2009 Document Revised: 04/22/2016 Document Reviewed: 12/20/2014 Elsevier Interactive Patient Education  2017 [employer].

## 2023-02-09 ENCOUNTER — Encounter: Payer: Self-pay | Admitting: Oncology

## 2023-02-09 ENCOUNTER — Inpatient Hospital Stay: Payer: MEDICARE | Attending: Oncology | Admitting: Oncology

## 2023-02-09 ENCOUNTER — Inpatient Hospital Stay: Payer: MEDICARE

## 2023-02-09 VITALS — BP 145/82 | HR 68 | Temp 98.1°F | Resp 18 | Ht 71.0 in | Wt 201.6 lb

## 2023-02-09 DIAGNOSIS — C4442 Squamous cell carcinoma of skin of scalp and neck: Secondary | ICD-10-CM | POA: Diagnosis present

## 2023-02-09 DIAGNOSIS — C9110 Chronic lymphocytic leukemia of B-cell type not having achieved remission: Secondary | ICD-10-CM | POA: Diagnosis not present

## 2023-02-09 DIAGNOSIS — N529 Male erectile dysfunction, unspecified: Secondary | ICD-10-CM | POA: Diagnosis not present

## 2023-02-09 DIAGNOSIS — R590 Localized enlarged lymph nodes: Secondary | ICD-10-CM | POA: Insufficient documentation

## 2023-02-09 DIAGNOSIS — Z79899 Other long term (current) drug therapy: Secondary | ICD-10-CM | POA: Diagnosis not present

## 2023-02-09 DIAGNOSIS — D044 Carcinoma in situ of skin of scalp and neck: Secondary | ICD-10-CM | POA: Diagnosis not present

## 2023-02-09 DIAGNOSIS — D696 Thrombocytopenia, unspecified: Secondary | ICD-10-CM | POA: Insufficient documentation

## 2023-02-09 DIAGNOSIS — Z5111 Encounter for antineoplastic chemotherapy: Secondary | ICD-10-CM | POA: Insufficient documentation

## 2023-02-09 DIAGNOSIS — C9111 Chronic lymphocytic leukemia of B-cell type in remission: Secondary | ICD-10-CM | POA: Insufficient documentation

## 2023-02-09 DIAGNOSIS — Z7901 Long term (current) use of anticoagulants: Secondary | ICD-10-CM

## 2023-02-09 LAB — CBC WITH DIFFERENTIAL (CANCER CENTER ONLY)
Abs Immature Granulocytes: 0.05 10*3/uL (ref 0.00–0.07)
Basophils Absolute: 0.1 10*3/uL (ref 0.0–0.1)
Basophils Relative: 1 %
Eosinophils Absolute: 0.4 10*3/uL (ref 0.0–0.5)
Eosinophils Relative: 5 %
HCT: 44.8 % (ref 39.0–52.0)
Hemoglobin: 15.1 g/dL (ref 13.0–17.0)
Immature Granulocytes: 1 %
Lymphocytes Relative: 36 %
Lymphs Abs: 3.1 10*3/uL (ref 0.7–4.0)
MCH: 30.9 pg (ref 26.0–34.0)
MCHC: 33.7 g/dL (ref 30.0–36.0)
MCV: 91.8 fL (ref 80.0–100.0)
Monocytes Absolute: 1 10*3/uL (ref 0.1–1.0)
Monocytes Relative: 11 %
Neutro Abs: 4 10*3/uL (ref 1.7–7.7)
Neutrophils Relative %: 46 %
Platelet Count: 95 10*3/uL — ABNORMAL LOW (ref 150–400)
RBC: 4.88 MIL/uL (ref 4.22–5.81)
RDW: 12.9 % (ref 11.5–15.5)
WBC Count: 8.6 10*3/uL (ref 4.0–10.5)
nRBC: 0 % (ref 0.0–0.2)

## 2023-02-09 LAB — CMP (CANCER CENTER ONLY)
ALT: 9 U/L (ref 0–44)
AST: 15 U/L (ref 15–41)
Albumin: 4.4 g/dL (ref 3.5–5.0)
Alkaline Phosphatase: 36 U/L — ABNORMAL LOW (ref 38–126)
Anion gap: 7 (ref 5–15)
BUN: 22 mg/dL (ref 8–23)
CO2: 31 mmol/L (ref 22–32)
Calcium: 9.5 mg/dL (ref 8.9–10.3)
Chloride: 101 mmol/L (ref 98–111)
Creatinine: 1.43 mg/dL — ABNORMAL HIGH (ref 0.61–1.24)
GFR, Estimated: 50 mL/min — ABNORMAL LOW (ref >60)
Glucose, Bld: 86 mg/dL (ref 70–99)
Potassium: 4.2 mmol/L (ref 3.5–5.1)
Sodium: 139 mmol/L (ref 135–145)
Total Bilirubin: 0.7 mg/dL (ref 0.3–1.2)
Total Protein: 7.5 g/dL (ref 6.5–8.1)

## 2023-02-09 LAB — PROTIME-INR
INR: 2.4 — ABNORMAL HIGH (ref 0.8–1.2)
Prothrombin Time: 26 seconds — ABNORMAL HIGH (ref 11.4–15.2)

## 2023-02-09 NOTE — Progress Notes (Signed)
START ON PATHWAY REGIMEN - Melanoma and Other Skin Cancers     A cycle is every 21 days:     Cemiplimab-rwlc   **Always confirm dose/schedule in your pharmacy ordering system**  Patient Characteristics: Cutaneous Squamous Cell Carcinoma, Locally Advanced - Unresectable, Systemic Therapy Indicated Disease Classification: Cutaneous Squamous Cell Carcinoma Therapeutic Status: Locally Advanced - Unresectable Click here if multiple primary tumors are present.: false Intent of Therapy: Non-Curative / Palliative Intent, Discussed with Patient 

## 2023-02-09 NOTE — Progress Notes (Signed)
[place] OFFICE PROGRESS NOTE   Diagnosis: CLL, squamous cell carcinoma of the scalp  INTERVAL HISTORY:   Mr. Garnier returns prior to a scheduled visit.  He continues Coumadin anticoagulation.  No palpable lymph nodes, fever, night sweats, bleeding, or symptom of thrombosis. He had a squamous cell carcinoma removed from the forehead in August.  He went Mohs surgery.  He is followed by Dr. Martin Majestic.  She contacted me and reports that he has developed multiple nodular lesions over the forehead and more lateral scalp.  Biopsies on 01/06/2023 from the inferior central temple, right zygoma, and left central anterior scalp returned as squamous cell carcinoma.  Biopsies from 01/12/2023 revealed squamous cell carcinomas at the right superior forehead, right mid medial forehead, and right midline inferior forehead with extension to the edge and base.  He was referred to [facility] and saw Dr. Dorita Fray on 02/02/2023.  Multiple squamous cell appearing lesions were noted at the forehead.  He recommends treatment with Cemiplimab prior to considering additional surgery.  Objective:  Vital signs in last 24 hours:  Blood pressure (!) 145/82, pulse 68, temperature 98.1 F (36.7 C), temperature source Oral, resp. rate 18, height '5\' 11"'$  (1.803 m), weight 201 lb 9.6 oz (91.4 kg), SpO2 100 %.    HEENT: Neck without mass Lymphatics: 1 cm soft mobile left low posterior cervical/scalene node, no other cervical, supraclavicular, axillary, or inguinal nodes Resp: Lungs clear bilaterally Cardio: Regular rate and rhythm GI: No hepatosplenomegaly, firm fullness in the mid upper abdomen Vascular: The right lower leg is slightly larger than the left side  Skin: Multiple biopsy sites over the scalp including midline forehead biopsy sites with evidence of recurrent tumor.  There are other nodular lesions over the forehead and temporal scalp with appearance of recurrent tumor  Lab Results:  Lab Results  Component  Value Date   WBC 8.6 02/09/2023   HGB 15.1 02/09/2023   HCT 44.8 02/09/2023   MCV 91.8 02/09/2023   PLT 95 (L) 02/09/2023   NEUTROABS 4.0 02/09/2023    CMP  Lab Results  Component Value Date   NA 141 02/25/2021   K 4.7 02/25/2021   CL 102 02/25/2021   CO2 29 02/25/2021   GLUCOSE 76 02/25/2021   BUN 31 (H) 02/25/2021   CREATININE 1.61 (H) 02/25/2021   CALCIUM 9.0 02/25/2021   PROT 7.4 02/25/2021   ALBUMIN 4.1 02/25/2021   AST 18 02/25/2021   ALT 16 02/25/2021   ALKPHOS 44 02/25/2021   BILITOT 0.5 02/25/2021   GFRNONAA 44 (L) 02/25/2021   GFRAA 52 (L) 08/13/2020    No results found for: "CEA1", "CEA", "[id number]", "CA125"  Lab Results  Component Value Date   INR 2.4 (H) 02/09/2023   LABPROT 26.0 (H) 02/09/2023     Medications: I have reviewed the patient's current medications.   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 He will return for a monthly PT check.  Indwelling IVC catheter.  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        4. 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 erectile dysfunction-followed by Dr. Jeffie Pollock , status post placement of a penile implant at [facility] in April 2014            7. CT of the abdomen/pelvis at [facility] urology February 2014 for evaluation of hematuria-12 mm external iliac nodes and  left periaortic retroperitoneal lymph node-11 mm-potentially related to CLL      8.  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, peripheral lymphocytosis and small palpable lymph nodes          CT abdomen/pelvis 09/18/2019-extensive abdominal pelvic lymphadenopathy including a left periaortic node measuring 5.7 cm 08/13/2020 CLL FISH panel-no evidence of trisomy 12; no evidence of [id number]; no evidence of p53 deletion or amplification; deletion of ATM  present 11/05/2020-peripheral blood TP53 mutation-negative Cycle 1 bendamustine/Rituxan 10/08/2020, 10/09/2020 Cycle 2 bendamustine/Rituxan 11/05/2020, 11/06/2020 Cycle 3 bendamustine/Rituxan 12/03/2020, 12/04/2020 Cycle 4 bendamustine/Rituxan 12/31/2020, 01/01/2021 CTs 01/19/2021-decrease in bulky abdomen/pelvic lymphadenopathy with more widespread ill-defined "haziness "in the mesentery and retroperitoneal fat, decreased pelvic lymphadenopathy Cycle 5 bendamustine/Rituxan 01/28/2021, 01/29/2021 Cycle 6 bendamustine/rituximab 02/25/2021, 02/26/2021     9.  Basal cell carcinoma removed from the left superior central forehead 01/21/2020    10.  Renal insufficiency    11.  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 Ciccone is in clinical remission from CLL, but there is a small palpable left neck lymph node and abdominal mass on exam today.  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  Mr. Ruston has been evaluated at [facility] and treatment with Cemiplimab is recommended.  He will follow-up in surgical oncology to consider resection of any remaining tumor.  He will be referred for restaging CT scans to look for evidence of distant metastases and restage the CLL.  The plan is to begin treatment with Cemiplimab next week.  I reviewed potential toxicities associated with Cemiplimab including the chance of a rash, diarrhea, and various autoimmune toxicities.  This included a discussion of hypothyroidism, hypopituitarism, pneumonitis, hepatitis, CNS toxicity, neuropathy.  He was given reading materials on Cemiplimab.  He agrees to proceed.  The plan is to begin treatment with Cemiplimab  next week.  We obtained pictures of the forehead nodules for upload to epic.  A treatment care plan was entered today.  Betsy Coder, MD  02/09/2023  2:42 PM

## 2023-02-13 ENCOUNTER — Other Ambulatory Visit: Payer: Self-pay | Admitting: Oncology

## 2023-02-14 ENCOUNTER — Encounter: Payer: Self-pay | Admitting: Oncology

## 2023-02-14 ENCOUNTER — Encounter: Payer: Self-pay | Admitting: *Deleted

## 2023-02-14 ENCOUNTER — Other Ambulatory Visit: Payer: MEDICARE

## 2023-02-14 ENCOUNTER — Inpatient Hospital Stay: Payer: MEDICARE

## 2023-02-14 VITALS — BP 136/68 | HR 68 | Temp 98.2°F | Resp 18 | Ht 71.0 in | Wt 201.6 lb

## 2023-02-14 DIAGNOSIS — Z5111 Encounter for antineoplastic chemotherapy: Secondary | ICD-10-CM | POA: Diagnosis not present

## 2023-02-14 DIAGNOSIS — C9110 Chronic lymphocytic leukemia of B-cell type not having achieved remission: Secondary | ICD-10-CM

## 2023-02-14 MED ORDER — SODIUM CHLORIDE 0.9 % IV SOLN
350.0000 mg | Freq: Once | INTRAVENOUS | Status: AC
  Administered 2023-02-14: 350 mg via INTRAVENOUS
  Filled 2023-02-14: qty 7

## 2023-02-14 MED ORDER — SODIUM CHLORIDE 0.9 % IV SOLN: Freq: Once | INTRAVENOUS | Status: AC

## 2023-02-14 NOTE — Patient Instructions (Signed)
[place]   Discharge Instructions: Thank you for choosing [facility] to provide your oncology and hematology care.   If you have a lab appointment with the [facility], please go directly to the [facility] and check in at the registration area.   Wear comfortable clothing and clothing appropriate for easy access to any Portacath or PICC line.   We strive to give you quality time with your provider. You may need to reschedule your appointment if you arrive late (15 or more minutes).  Arriving late affects you and other patients whose appointments are after yours.  Also, if you miss three or more appointments without notifying the office, you may be dismissed from the clinic at the provider's discretion.      For prescription refill requests, have your pharmacy contact our office and allow 72 hours for refills to be completed.    Today you received the following chemotherapy and/or immunotherapy agents Cemiplimab.      To help prevent nausea and vomiting after your treatment, we encourage you to take your nausea medication as directed.  BELOW ARE SYMPTOMS THAT SHOULD BE REPORTED IMMEDIATELY: *FEVER GREATER THAN 100.4 F (38 C) OR HIGHER *CHILLS OR SWEATING *NAUSEA AND VOMITING THAT IS NOT CONTROLLED WITH YOUR NAUSEA MEDICATION *UNUSUAL SHORTNESS OF BREATH *UNUSUAL BRUISING OR BLEEDING *URINARY PROBLEMS (pain or burning when urinating, or frequent urination) *BOWEL PROBLEMS (unusual diarrhea, constipation, pain near the anus) TENDERNESS IN MOUTH AND THROAT WITH OR WITHOUT PRESENCE OF ULCERS (sore throat, sores in mouth, or a toothache) UNUSUAL RASH, SWELLING OR PAIN  UNUSUAL VAGINAL DISCHARGE OR ITCHING   Items with * indicate a potential emergency and should be followed up as soon as possible or go to the Emergency Department if any problems should occur.  Please show the CHEMOTHERAPY ALERT CARD or IMMUNOTHERAPY ALERT CARD at  check-in to the Emergency Department and triage nurse.  Should you have questions after your visit or need to cancel or reschedule your appointment, please contact [facility]  Dept: [phone]  and follow the prompts.  Office hours are 8:00 a.m. to 4:30 p.m. Monday - Friday. Please note that voicemails left after 4:00 p.m. may not be returned until the following business day.  We are closed weekends and major holidays. You have access to a nurse at all times for urgent questions. Please call the main number to the clinic Dept: [phone] and follow the prompts.   For any non-urgent questions, you may also contact your provider using MyChart. We now offer e-Visits for anyone 41 and older to request care online for non-urgent symptoms. For details visit [email or web address].   Also download the MyChart app! Go to the app store, search "MyChart", open the app, select [facility], and log in with your MyChart username and password.  Cemiplimab Injection What is this medication? CEMIPLIMAB (se MIP li mab) treats skin cancer and lung cancer. It works by helping your immune system slow or stop the spread of cancer cells. It is a monoclonal antibody. This medicine may be used for other purposes; ask your health care provider or pharmacist if you have questions. COMMON BRAND NAME(S): LIBTAYO What should I tell my care team before I take this medication? They need to know if you have any of these conditions: Allogeneic stem cell transplant (uses someone else's stem cells) Autoimmune diseases, such as Crohn's disease, ulcerative colitis, or lupus Diabetes Nervous system problems, such  as myasthenia gravis or Guillain-Barre syndrome Organ transplant Recent or ongoing radiation Thyroid disease An unusual or allergic reaction to cemiplimab, other medications, foods, dyes, or preservatives Pregnant or trying to get pregnant Breast-feeding How should I use this  medication? This medication is infused into a vein. It is given by your care team in a hospital or clinic setting. A special MedGuide will be given to you before each treatment. Be sure to read this information carefully each time. Talk to your care team about the use of this medication in children. Special care may be needed. Overdosage: If you think you have taken too much of this medicine contact a poison control center or emergency room at once. NOTE: This medicine is only for you. Do not share this medicine with others. What if I miss a dose? Keep appointments for follow-up doses. It is important not to miss your dose. Call your care team if you are unable to keep an appointment. What may interact with this medication? Interactions have not been studied. This list may not describe all possible interactions. Give your health care provider a list of all the medicines, herbs, non-prescription drugs, or dietary supplements you use. Also tell them if you smoke, drink alcohol, or use illegal drugs. Some items may interact with your medicine. What should I watch for while using this medication? This medication may make you feel generally unwell. This is not uncommon as chemotherapy can affect healthy cells as well as cancer cells. Report any side effects. Continue your course of treatment even though you feel ill until your care team tells you to stop. You may need blood work done while you are taking this medication. This medication may cause serious skin reactions. They can happen in the weeks to months after starting the medication. Contact your care team right away if you notice fevers or flu-like symptoms with a rash. The rash may be red or purple and then turn into blisters or peeling of the skin. You may also notice a red rash with swelling of the face, lips, or lymph nodes in your neck or under your arms. Tell your care team right away if you have any changes in your vision. This medication may  increase blood sugar. The risk may be higher in patients who already have diabetes. Ask your care team what you can do to lower your risk of diabetes while taking this medication. Talk to your care team if you wish to become pregnant or think you might be pregnant. This medication can cause serious birth defects if taken during pregnancy. A negative pregnancy test is required before starting this medication. A reliable form of contraception is recommended while taking this medication and for at least 4 months after stopping it. Do not breast-feed while taking this medication and for at least 4 months after stopping it. What side effects may I notice from receiving this medication? Side effects that you should report to your care team as soon as possible: Allergic reactions--skin rash, itching, hives, swelling of the face, lips, tongue, or throat Dry cough, shortness of breath or trouble breathing Eye pain, redness, irritation, or discharge with blurry or decreased vision Heart muscle inflammation--unusual weakness or fatigue, shortness of breath, chest pain, fast or irregular heartbeat, dizziness, swelling of the ankles, feet, or hands Hormone gland problems--headache, sensitivity to light, unusual weakness or fatigue, dizziness, fast or irregular heartbeat, increased sensitivity to cold or heat, excessive sweating, constipation, hair loss, increased thirst or amount of urine, tremors  or shaking, irritability Infusion reactions--chest pain, shortness of breath or trouble breathing, feeling faint or lightheaded Kidney injury (glomerulonephritis)--decrease in the amount of urine, red or dark brown urine, foamy or bubbly urine, swelling of the ankles, hands, or feet Liver injury--right upper belly pain, loss of appetite, nausea, light-colored stool, dark yellow or brown urine, yellowing skin or eyes, unusual weakness or fatigue Pain, tingling, or numbness in the hands or feet, muscle weakness, change in  vision, confusion or trouble speaking, loss of balance or coordination, trouble walking, seizures Rash, fever, and swollen lymph nodes Redness, blistering, peeling, or loosening of the skin, including inside the mouth Sudden or severe stomach pain, bloody diarrhea, fever, nausea, vomiting Side effects that usually do not require medical attention (report these to your care team if they continue or are bothersome): Bone, joint, or muscle pain Diarrhea Fatigue Loss of appetite Nausea Skin rash This list may not describe all possible side effects. Call your doctor for medical advice about side effects. You may report side effects to FDA at 1-800-FDA-1088. Where should I keep my medication? This medication is given in a hospital or clinic and will not be stored at home. NOTE: This sheet is a summary. It may not cover all possible information. If you have questions about this medicine, talk to your doctor, pharmacist, or health care provider.  2023 Elsevier/Gold Standard (2021-10-16 00:00:00)

## 2023-02-14 NOTE — Progress Notes (Signed)
Per Dr. Benay Spice: OK to proceed w/treatment with platelet count 95,000.

## 2023-02-15 ENCOUNTER — Telehealth: Payer: Self-pay | Admitting: Emergency Medicine

## 2023-02-15 ENCOUNTER — Other Ambulatory Visit: Payer: MEDICARE

## 2023-02-15 NOTE — Telephone Encounter (Signed)
24 Hour Callback 24 Hour callback post 1st time Cemiplimab. Patient reports some fatigue and overall achy ness. Denies any fever or rash. Eating and drinking well. Denies any N/V/D. Pt had no questions.  Pt knows to call with any questions or concerns.

## 2023-02-16 ENCOUNTER — Encounter: Payer: Self-pay | Admitting: Oncology

## 2023-02-16 ENCOUNTER — Telehealth: Payer: Self-pay | Admitting: *Deleted

## 2023-02-16 NOTE — Telephone Encounter (Signed)
Chad Gross had sent message that his penis is swollen and tender. Had low grade fever couple days ago of 100.1. Has been swollen ~ 2 days and not getting better. Asking if it is due to the recent treatment with cemiplimab? Per Dr. Benay Spice: Not side effect of this. Suggested he elevate area as much as possible. Can try cold compress and call urology if he gets fever or swelling does not improve. He understands and agrees.

## 2023-02-17 ENCOUNTER — Encounter (HOSPITAL_BASED_OUTPATIENT_CLINIC_OR_DEPARTMENT_OTHER): Payer: Self-pay | Admitting: Family Medicine

## 2023-02-17 ENCOUNTER — Encounter: Payer: Self-pay | Admitting: *Deleted

## 2023-02-22 ENCOUNTER — Encounter (HOSPITAL_BASED_OUTPATIENT_CLINIC_OR_DEPARTMENT_OTHER): Payer: Self-pay

## 2023-02-22 ENCOUNTER — Ambulatory Visit (INDEPENDENT_AMBULATORY_CARE_PROVIDER_SITE_OTHER): Payer: MEDICARE | Admitting: Family Medicine

## 2023-02-22 ENCOUNTER — Encounter (HOSPITAL_BASED_OUTPATIENT_CLINIC_OR_DEPARTMENT_OTHER): Payer: Self-pay | Admitting: Family Medicine

## 2023-02-22 ENCOUNTER — Ambulatory Visit (HOSPITAL_BASED_OUTPATIENT_CLINIC_OR_DEPARTMENT_OTHER)
Admission: RE | Admit: 2023-02-22 | Discharge: 2023-02-22 | Disposition: A | Discharge status: Home / Self Care | Payer: MEDICARE | Source: Ambulatory Visit | Attending: Oncology | Admitting: Oncology

## 2023-02-22 VITALS — BP 138/82 | HR 72 | Ht 71.0 in | Wt 203.0 lb

## 2023-02-22 DIAGNOSIS — C9110 Chronic lymphocytic leukemia of B-cell type not having achieved remission: Secondary | ICD-10-CM | POA: Insufficient documentation

## 2023-02-22 DIAGNOSIS — N4889 Other specified disorders of penis: Secondary | ICD-10-CM | POA: Diagnosis not present

## 2023-02-22 NOTE — Progress Notes (Signed)
    Procedures performed today:    None.  Independent interpretation of notes and tests performed by another provider:   None.  Brief History, Exam, Impression, and Recommendations:    BP 138/82 (BP Location: Right Arm, Patient Position: Sitting, Cuff Size: Normal)   Pulse 72   Ht 5\' 11"  (1.803 m)   Wt 203 lb (92.1 kg)   SpO2 100%   BMI 28.31 kg/m   Penile swelling Patient recently had treatment with oncology with Cemiplimab.  He reports that shortly thereafter, he began to have penile swelling with associated discomfort.  Denies to have any pain or burning with urination, however did have some difficulty with urination due to penile swelling that was observed.  No systemic symptoms reported.  Initial symptoms were a couple weeks ago, he has noted improvement in swelling since it was first started.  He does have history of penile implant and previously had seen urologist, Dr. Idamae Schuller, and did reach out to their office to schedule appointment.  His last appointment with them was more than 5 years ago and was last seen his appointment but they were able to schedule was in a couple weeks. On exam, patient is in no acute distress, vital signs stable.  He does have moderate swelling of penis in office with some mild erythema near base of penis, no inguinal lymphadenopathy appreciated.  No penile discharge.  Small amount of scabbing noted at ventral tip of penis.  No current drainage or oozing. Uncertain etiology for penile swelling.  Temporal relation to suggest that it could possibly be a side effect of recent medication, however his oncologist is not aware of this being a potential side effect.  I do feel that further evaluation with urology would be reasonable.  Particular given history of penile implant.  We have also placed referral to urologist today to assist in scheduling appointment and potentially having appointment moved up.  Return if symptoms worsen or fail to  improve.   ___________________________________________ Vernelle Wisner de [place], MD, ABFM, [facility] and [place]

## 2023-02-22 NOTE — Assessment & Plan Note (Signed)
Patient recently had treatment with oncology with Cemiplimab.  He reports that shortly thereafter, he began to have penile swelling with associated discomfort.  Denies to have any pain or burning with urination, however did have some difficulty with urination due to penile swelling that was observed.  No systemic symptoms reported.  Initial symptoms were a couple weeks ago, he has noted improvement in swelling since it was first started.  He does have history of penile implant and previously had seen urologist, Dr. Idamae Schuller, and did reach out to their office to schedule appointment.  His last appointment with them was more than 5 years ago and was last seen his appointment but they were able to schedule was in a couple weeks. On exam, patient is in no acute distress, vital signs stable.  He does have moderate swelling of penis in office with some mild erythema near base of penis, no inguinal lymphadenopathy appreciated.  No penile discharge.  Small amount of scabbing noted at ventral tip of penis.  No current drainage or oozing. Uncertain etiology for penile swelling.  Temporal relation to suggest that it could possibly be a side effect of recent medication, however his oncologist is not aware of this being a potential side effect.  I do feel that further evaluation with urology would be reasonable.  Particular given history of penile implant.  We have also placed referral to urologist today to assist in scheduling appointment and potentially having appointment moved up.

## 2023-02-23 ENCOUNTER — Encounter: Payer: Self-pay | Admitting: Oncology

## 2023-02-23 ENCOUNTER — Encounter (HOSPITAL_BASED_OUTPATIENT_CLINIC_OR_DEPARTMENT_OTHER): Payer: Self-pay | Admitting: Family Medicine

## 2023-02-24 ENCOUNTER — Encounter: Payer: Self-pay | Admitting: *Deleted

## 2023-02-24 NOTE — Progress Notes (Signed)
Received fax from [facility] requesting medical records on Mr. Boney. This was forwarded via email to HIM department.

## 2023-02-25 ENCOUNTER — Encounter: Payer: Self-pay | Admitting: *Deleted

## 2023-02-25 ENCOUNTER — Other Ambulatory Visit (HOSPITAL_BASED_OUTPATIENT_CLINIC_OR_DEPARTMENT_OTHER): Payer: Self-pay | Admitting: Family Medicine

## 2023-03-01 ENCOUNTER — Other Ambulatory Visit: Payer: Self-pay

## 2023-03-03 MED ORDER — TRAMADOL HCL 50 MG PO TABS: 50.0000 mg | ORAL_TABLET | Freq: Four times a day (QID) | ORAL | 0 refills | Status: DC | PRN

## 2023-03-05 ENCOUNTER — Other Ambulatory Visit: Payer: Self-pay | Admitting: Oncology

## 2023-03-05 ENCOUNTER — Encounter: Payer: Self-pay | Admitting: Oncology

## 2023-03-07 ENCOUNTER — Inpatient Hospital Stay: Payer: MEDICARE

## 2023-03-07 ENCOUNTER — Inpatient Hospital Stay (HOSPITAL_BASED_OUTPATIENT_CLINIC_OR_DEPARTMENT_OTHER): Payer: MEDICARE | Admitting: Oncology

## 2023-03-07 ENCOUNTER — Inpatient Hospital Stay: Payer: MEDICARE | Attending: Oncology

## 2023-03-07 ENCOUNTER — Encounter: Payer: Self-pay | Admitting: *Deleted

## 2023-03-07 VITALS — BP 134/88 | HR 100 | Temp 98.2°F | Resp 18 | Ht 71.0 in | Wt 196.0 lb

## 2023-03-07 VITALS — BP 114/65 | HR 89

## 2023-03-07 DIAGNOSIS — C9110 Chronic lymphocytic leukemia of B-cell type not having achieved remission: Secondary | ICD-10-CM

## 2023-03-07 DIAGNOSIS — Z79899 Other long term (current) drug therapy: Secondary | ICD-10-CM | POA: Diagnosis not present

## 2023-03-07 DIAGNOSIS — D696 Thrombocytopenia, unspecified: Secondary | ICD-10-CM | POA: Diagnosis not present

## 2023-03-07 DIAGNOSIS — N529 Male erectile dysfunction, unspecified: Secondary | ICD-10-CM | POA: Diagnosis not present

## 2023-03-07 DIAGNOSIS — C9111 Chronic lymphocytic leukemia of B-cell type in remission: Secondary | ICD-10-CM | POA: Diagnosis present

## 2023-03-07 DIAGNOSIS — Z5111 Encounter for antineoplastic chemotherapy: Secondary | ICD-10-CM | POA: Diagnosis present

## 2023-03-07 DIAGNOSIS — Z7901 Long term (current) use of anticoagulants: Secondary | ICD-10-CM | POA: Diagnosis not present

## 2023-03-07 DIAGNOSIS — C4442 Squamous cell carcinoma of skin of scalp and neck: Secondary | ICD-10-CM | POA: Diagnosis present

## 2023-03-07 LAB — CBC WITH DIFFERENTIAL (CANCER CENTER ONLY)
Abs Immature Granulocytes: 0.08 10*3/uL — ABNORMAL HIGH (ref 0.00–0.07)
Basophils Absolute: 0 10*3/uL (ref 0.0–0.1)
Basophils Relative: 1 %
Eosinophils Absolute: 0 10*3/uL (ref 0.0–0.5)
Eosinophils Relative: 0 %
HCT: 40.9 % (ref 39.0–52.0)
Hemoglobin: 14 g/dL (ref 13.0–17.0)
Immature Granulocytes: 1 %
Lymphocytes Relative: 56 %
Lymphs Abs: 3.4 10*3/uL (ref 0.7–4.0)
MCH: 31.2 pg (ref 26.0–34.0)
MCHC: 34.2 g/dL (ref 30.0–36.0)
MCV: 91.1 fL (ref 80.0–100.0)
Monocytes Absolute: 0.6 10*3/uL (ref 0.1–1.0)
Monocytes Relative: 9 %
Neutro Abs: 2 10*3/uL (ref 1.7–7.7)
Neutrophils Relative %: 33 %
Platelet Count: 108 10*3/uL — ABNORMAL LOW (ref 150–400)
RBC: 4.49 MIL/uL (ref 4.22–5.81)
RDW: 13.5 % (ref 11.5–15.5)
WBC Count: 6 10*3/uL (ref 4.0–10.5)
nRBC: 0 % (ref 0.0–0.2)

## 2023-03-07 LAB — CMP (CANCER CENTER ONLY)
ALT: 12 U/L (ref 0–44)
AST: 25 U/L (ref 15–41)
Albumin: 4 g/dL (ref 3.5–5.0)
Alkaline Phosphatase: 37 U/L — ABNORMAL LOW (ref 38–126)
Anion gap: 9 (ref 5–15)
BUN: 22 mg/dL (ref 8–23)
CO2: 28 mmol/L (ref 22–32)
Calcium: 10 mg/dL (ref 8.9–10.3)
Chloride: 99 mmol/L (ref 98–111)
Creatinine: 1.39 mg/dL — ABNORMAL HIGH (ref 0.61–1.24)
GFR, Estimated: 52 mL/min — ABNORMAL LOW (ref >60)
Glucose, Bld: 100 mg/dL — ABNORMAL HIGH (ref 70–99)
Potassium: 3.9 mmol/L (ref 3.5–5.1)
Sodium: 136 mmol/L (ref 135–145)
Total Bilirubin: 0.8 mg/dL (ref 0.3–1.2)
Total Protein: 7.7 g/dL (ref 6.5–8.1)

## 2023-03-07 LAB — PROTIME-INR
INR: 2.6 — ABNORMAL HIGH (ref 0.8–1.2)
Prothrombin Time: 28 seconds — ABNORMAL HIGH (ref 11.4–15.2)

## 2023-03-07 MED ORDER — MECLIZINE HCL 12.5 MG PO TABS: 12.5000 mg | ORAL_TABLET | Freq: Three times a day (TID) | ORAL | 0 refills | Status: DC | PRN

## 2023-03-07 MED ORDER — SODIUM CHLORIDE 0.9 % IV SOLN
350.0000 mg | Freq: Once | INTRAVENOUS | Status: AC
  Administered 2023-03-07: 350 mg via INTRAVENOUS
  Filled 2023-03-07: qty 7

## 2023-03-07 MED ORDER — PROCHLORPERAZINE MALEATE 5 MG PO TABS: 5.0000 mg | ORAL_TABLET | Freq: Four times a day (QID) | ORAL | 1 refills | Status: DC | PRN

## 2023-03-07 MED ORDER — SODIUM CHLORIDE 0.9 % IV SOLN: Freq: Once | INTRAVENOUS | Status: AC

## 2023-03-07 NOTE — Progress Notes (Signed)
[facility] OFFICE PROGRESS NOTE   Diagnosis: CLL, squamous cell skin cancer  INTERVAL HISTORY:   Chad Gross turns as scheduled.  He completed cycle 1 Cemiplimab on 02/14/2023.  He reports developing malaise, "aching ", and a low-grade fever for the next week.  His significant other was diagnosed with influenza A during the same time.  He now feels better.  His appetite is diminished.  He reports early satiety.  The skin lesions over the forehead have improved. He reports penile swelling.  He is scheduled to see urology.  He has intermittent night sweats.  Objective:  Vital signs in last 24 hours:  Blood pressure 134/88, pulse 100, temperature 98.2 F (36.8 C), temperature source Oral, resp. rate 18, height 5\' 11"  (1.803 m), weight 196 lb (88.9 kg), SpO2 98 %.    HEENT: No thrush Lymphatics: No cervical, supraclavicular, axillary, or inguinal nodes Resp: Lungs clear bilaterally Cardio: Regular rhythm with premature beats GI: No hepatosplenomegaly, firm masslike fullness with associated tenderness in the mid abdomen Vascular: The right lower leg is larger than the left side, no edema GU: Penile prosthesis  Skin: Nodular lesions over the forehead appear less prominent  Portacath/PICC-without erythema  Lab Results:  Lab Results  Component Value Date   WBC 6.0 03/07/2023   HGB 14.0 03/07/2023   HCT 40.9 03/07/2023   MCV 91.1 03/07/2023   PLT 108 (L) 03/07/2023   NEUTROABS 2.0 03/07/2023    CMP  Lab Results  Component Value Date   NA 136 03/07/2023   K 3.9 03/07/2023   CL 99 03/07/2023   CO2 28 03/07/2023   GLUCOSE 100 (H) 03/07/2023   BUN 22 03/07/2023   CREATININE 1.39 (H) 03/07/2023   CALCIUM 10.0 03/07/2023   PROT 7.7 03/07/2023   ALBUMIN 4.0 03/07/2023   AST PENDING 03/07/2023   ALT PENDING 03/07/2023   ALKPHOS 37 (L) 03/07/2023   BILITOT 0.8 03/07/2023   GFRNONAA 52 (L) 03/07/2023   GFRAA 52 (L) 08/13/2020    No results found for:  "CEA1", "CEA", "YYQ825", "CA125"  Lab Results  Component Value Date   INR 2.6 (H) 03/07/2023   LABPROT 28.0 (H) 03/07/2023    Imaging: CT images from 02/22/2023 reviewed with Chad Gross Medications: I have reviewed the patient's current medications.   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 He will return for a monthly PT check.  Indwelling IVC catheter.  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        4. 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 erectile dysfunction-followed by Dr. Annabell Gross , status post placement of a penile implant at [facility] in April 2014            7. CT of the abdomen/pelvis at [facility] urology February 2014 for evaluation of hematuria-12 mm external iliac nodes and left periaortic retroperitoneal lymph node-11 mm-potentially related to CLL      8.  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, peripheral lymphocytosis and small palpable lymph nodes          CT abdomen/pelvis 09/18/2019-extensive abdominal pelvic lymphadenopathy including a left periaortic node measuring 5.7 cm 08/13/2020 CLL FISH panel-no evidence of trisomy 12; no evidence of [id number]; no evidence of p53 deletion or amplification; deletion of ATM present 11/05/2020-peripheral blood TP53 mutation-negative Cycle 1 bendamustine/Rituxan 10/08/2020, 10/09/2020 Cycle 2  bendamustine/Rituxan 11/05/2020, 11/06/2020 Cycle 3 bendamustine/Rituxan 12/03/2020, 12/04/2020 Cycle 4 bendamustine/Rituxan 12/31/2020, 01/01/2021 CTs 01/19/2021-decrease in bulky abdomen/pelvic lymphadenopathy with more widespread ill-defined "haziness "in the mesentery and retroperitoneal fat, decreased pelvic lymphadenopathy Cycle 5 bendamustine/Rituxan 01/28/2021, 01/29/2021 CT 02/22/2023-mild generalized lymphadenopathy in the neck, significant increase in chest  andabdominal/pelvic lymph nodes, dominant nodal mass in the mesentery Cycle 6 bendamustine/rituximab 02/25/2021, 02/26/2021     9.  Basal cell carcinoma removed from the left superior central forehead 01/21/2020    10.  Renal insufficiency    11.  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  Indeterminate left hepatic lobe lesion on CT 02/22/2023      Disposition: Chad Gross completed 1 cycle of Cemiplimab last month.  The nodular scalp lesions appear improved.  He tolerated the treatment well.  I suspect the fever, malaise, and aching following Cemiplimab were related to a viral infection.  He will complete cycle 2 Cemiplimab today.  The diffuse lymphadenopathy is likely related to progression of CLL.  We will need to consider treating the CLL if the early satiety and abdominal lymph node mass continue to progress.  He will return for an office visit and at the next cycle of Cemiplimab in 3 weeks.  He continues therapeutic Coumadin anticoagulation.  I will discuss the left liver lesion with him when he returns in 3 weeks.  We will consider obtaining an abdominal MRI.  [name], MD  03/07/2023  11:05 AM

## 2023-03-07 NOTE — Patient Instructions (Signed)
[facility]   Discharge Instructions: Thank you for choosing [facility] to provide your oncology and hematology care.   If you have a lab appointment with the Cancer Center, please go directly to the Cancer Center and check in at the registration area.   Wear comfortable clothing and clothing appropriate for easy access to any Portacath or PICC line.   We strive to give you quality time with your provider. You may need to reschedule your appointment if you arrive late (15 or more minutes).  Arriving late affects you and other patients whose appointments are after yours.  Also, if you miss three or more appointments without notifying the office, you may be dismissed from the clinic at the provider's discretion.      For prescription refill requests, have your pharmacy contact our office and allow 72 hours for refills to be completed.    Today you received the following chemotherapy and/or immunotherapy agents Cemiplimab.      To help prevent nausea and vomiting after your treatment, we encourage you to take your nausea medication as directed.  BELOW ARE SYMPTOMS THAT SHOULD BE REPORTED IMMEDIATELY: *FEVER GREATER THAN 100.4 F (38 C) OR HIGHER *CHILLS OR SWEATING *NAUSEA AND VOMITING THAT IS NOT CONTROLLED WITH YOUR NAUSEA MEDICATION *UNUSUAL SHORTNESS OF BREATH *UNUSUAL BRUISING OR BLEEDING *URINARY PROBLEMS (pain or burning when urinating, or frequent urination) *BOWEL PROBLEMS (unusual diarrhea, constipation, pain near the anus) TENDERNESS IN MOUTH AND THROAT WITH OR WITHOUT PRESENCE OF ULCERS (sore throat, sores in mouth, or a toothache) UNUSUAL RASH, SWELLING OR PAIN  UNUSUAL VAGINAL DISCHARGE OR ITCHING   Items with * indicate a potential emergency and should be followed up as soon as possible or go to the Emergency Department if any problems should occur.  Please show the CHEMOTHERAPY ALERT CARD or IMMUNOTHERAPY ALERT CARD at  check-in to the Emergency Department and triage nurse.  Should you have questions after your visit or need to cancel or reschedule your appointment, please contact [facility]  Dept: [phone]  and follow the prompts.  Office hours are 8:00 a.m. to 4:30 p.m. Monday - Friday. Please note that voicemails left after 4:00 p.m. may not be returned until the following business day.  We are closed weekends and major holidays. You have access to a nurse at all times for urgent questions. Please call the main number to the clinic Dept: [phone] and follow the prompts.   For any non-urgent questions, you may also contact your provider using MyChart. We now offer e-Visits for anyone 18 and older to request care online for non-urgent symptoms. For details visit [email or web address].   Also download the MyChart app! Go to the app store, search "MyChart", open the app, select [facility], and log in with your MyChart username and password.  Cemiplimab Injection What is this medication? CEMIPLIMAB (se MIP li mab) treats skin cancer and lung cancer. It works by helping your immune system slow or stop the spread of cancer cells. It is a monoclonal antibody. This medicine may be used for other purposes; ask your health care provider or pharmacist if you have questions. COMMON BRAND NAME(S): LIBTAYO What should I tell my care team before I take this medication? They need to know if you have any of these conditions: Allogeneic stem cell transplant (uses someone else's stem cells) Autoimmune diseases, such as Crohn's disease, ulcerative colitis, or lupus Diabetes Nervous system problems, such   as myasthenia gravis or Guillain-Barre syndrome Organ transplant Recent or ongoing radiation Thyroid disease An unusual or allergic reaction to cemiplimab, other medications, foods, dyes, or preservatives Pregnant or trying to get pregnant Breast-feeding How should I use this  medication? This medication is infused into a vein. It is given by your care team in a hospital or clinic setting. A special MedGuide will be given to you before each treatment. Be sure to read this information carefully each time. Talk to your care team about the use of this medication in children. Special care may be needed. Overdosage: If you think you have taken too much of this medicine contact a poison control center or emergency room at once. NOTE: This medicine is only for you. Do not share this medicine with others. What if I miss a dose? Keep appointments for follow-up doses. It is important not to miss your dose. Call your care team if you are unable to keep an appointment. What may interact with this medication? Interactions have not been studied. This list may not describe all possible interactions. Give your health care provider a list of all the medicines, herbs, non-prescription drugs, or dietary supplements you use. Also tell them if you smoke, drink alcohol, or use illegal drugs. Some items may interact with your medicine. What should I watch for while using this medication? This medication may make you feel generally unwell. This is not uncommon as chemotherapy can affect healthy cells as well as cancer cells. Report any side effects. Continue your course of treatment even though you feel ill until your care team tells you to stop. You may need blood work done while you are taking this medication. This medication may cause serious skin reactions. They can happen in the weeks to months after starting the medication. Contact your care team right away if you notice fevers or flu-like symptoms with a rash. The rash may be red or purple and then turn into blisters or peeling of the skin. You may also notice a red rash with swelling of the face, lips, or lymph nodes in your neck or under your arms. Tell your care team right away if you have any changes in your vision. This medication may  increase blood sugar. The risk may be higher in patients who already have diabetes. Ask your care team what you can do to lower your risk of diabetes while taking this medication. Talk to your care team if you wish to become pregnant or think you might be pregnant. This medication can cause serious birth defects if taken during pregnancy. A negative pregnancy test is required before starting this medication. A reliable form of contraception is recommended while taking this medication and for at least 4 months after stopping it. Do not breast-feed while taking this medication and for at least 4 months after stopping it. What side effects may I notice from receiving this medication? Side effects that you should report to your care team as soon as possible: Allergic reactions--skin rash, itching, hives, swelling of the face, lips, tongue, or throat Dry cough, shortness of breath or trouble breathing Eye pain, redness, irritation, or discharge with blurry or decreased vision Heart muscle inflammation--unusual weakness or fatigue, shortness of breath, chest pain, fast or irregular heartbeat, dizziness, swelling of the ankles, feet, or hands Hormone gland problems--headache, sensitivity to light, unusual weakness or fatigue, dizziness, fast or irregular heartbeat, increased sensitivity to cold or heat, excessive sweating, constipation, hair loss, increased thirst or amount of urine, tremors   or shaking, irritability Infusion reactions--chest pain, shortness of breath or trouble breathing, feeling faint or lightheaded Kidney injury (glomerulonephritis)--decrease in the amount of urine, red or dark brown urine, foamy or bubbly urine, swelling of the ankles, hands, or feet Liver injury--right upper belly pain, loss of appetite, nausea, light-colored stool, dark yellow or brown urine, yellowing skin or eyes, unusual weakness or fatigue Pain, tingling, or numbness in the hands or feet, muscle weakness, change in  vision, confusion or trouble speaking, loss of balance or coordination, trouble walking, seizures Rash, fever, and swollen lymph nodes Redness, blistering, peeling, or loosening of the skin, including inside the mouth Sudden or severe stomach pain, bloody diarrhea, fever, nausea, vomiting Side effects that usually do not require medical attention (report these to your care team if they continue or are bothersome): Bone, joint, or muscle pain Diarrhea Fatigue Loss of appetite Nausea Skin rash This list may not describe all possible side effects. Call your doctor for medical advice about side effects. You may report side effects to FDA at 1-800-FDA-1088. Where should I keep my medication? This medication is given in a hospital or clinic and will not be stored at home. NOTE: This sheet is a summary. It may not cover all possible information. If you have questions about this medicine, talk to your doctor, pharmacist, or health care provider.  2023 Elsevier/Gold Standard (2021-10-16 00:00:00)  

## 2023-03-07 NOTE — Progress Notes (Signed)
Patient seen by Dr. Truett Perna today  Vitals are within treatment parameters.No intervention for BP 134/88  Labs reviewed by Dr. Truett Perna and are within treatment parameters.  Per physician team, patient is ready for treatment and there are NO modifications to the treatment plan.

## 2023-03-09 ENCOUNTER — Emergency Department (HOSPITAL_BASED_OUTPATIENT_CLINIC_OR_DEPARTMENT_OTHER): Payer: MEDICARE

## 2023-03-09 ENCOUNTER — Telehealth: Payer: Self-pay | Admitting: *Deleted

## 2023-03-09 ENCOUNTER — Encounter: Payer: Self-pay | Admitting: Oncology

## 2023-03-09 ENCOUNTER — Encounter (HOSPITAL_BASED_OUTPATIENT_CLINIC_OR_DEPARTMENT_OTHER): Payer: Self-pay

## 2023-03-09 ENCOUNTER — Other Ambulatory Visit: Payer: Self-pay

## 2023-03-09 ENCOUNTER — Emergency Department (HOSPITAL_BASED_OUTPATIENT_CLINIC_OR_DEPARTMENT_OTHER)
Admission: EM | Admit: 2023-03-09 | Discharge: 2023-03-09 | Disposition: A | Discharge status: Home / Self Care | Payer: MEDICARE | Attending: Emergency Medicine | Admitting: Emergency Medicine

## 2023-03-09 DIAGNOSIS — D649 Anemia, unspecified: Secondary | ICD-10-CM | POA: Diagnosis not present

## 2023-03-09 DIAGNOSIS — R6510 Systemic inflammatory response syndrome (SIRS) of non-infectious origin without acute organ dysfunction: Secondary | ICD-10-CM | POA: Diagnosis not present

## 2023-03-09 DIAGNOSIS — D696 Thrombocytopenia, unspecified: Secondary | ICD-10-CM | POA: Diagnosis not present

## 2023-03-09 DIAGNOSIS — Z7901 Long term (current) use of anticoagulants: Secondary | ICD-10-CM | POA: Insufficient documentation

## 2023-03-09 DIAGNOSIS — Z5111 Encounter for antineoplastic chemotherapy: Secondary | ICD-10-CM | POA: Diagnosis not present

## 2023-03-09 DIAGNOSIS — R000 Tachycardia, unspecified: Secondary | ICD-10-CM | POA: Insufficient documentation

## 2023-03-09 DIAGNOSIS — Z1152 Encounter for screening for COVID-19: Secondary | ICD-10-CM | POA: Diagnosis not present

## 2023-03-09 DIAGNOSIS — R509 Fever, unspecified: Secondary | ICD-10-CM | POA: Diagnosis present

## 2023-03-09 DIAGNOSIS — Z79899 Other long term (current) drug therapy: Secondary | ICD-10-CM | POA: Diagnosis not present

## 2023-03-09 DIAGNOSIS — I10 Essential (primary) hypertension: Secondary | ICD-10-CM | POA: Diagnosis not present

## 2023-03-09 DIAGNOSIS — R930 Abnormal findings on diagnostic imaging of skull and head, not elsewhere classified: Secondary | ICD-10-CM | POA: Insufficient documentation

## 2023-03-09 LAB — CBC WITH DIFFERENTIAL/PLATELET
Abs Immature Granulocytes: 0.04 10*3/uL (ref 0.00–0.07)
Basophils Absolute: 0 10*3/uL (ref 0.0–0.1)
Basophils Relative: 0 %
Eosinophils Absolute: 0 10*3/uL (ref 0.0–0.5)
Eosinophils Relative: 0 %
HCT: 36.6 % — ABNORMAL LOW (ref 39.0–52.0)
Hemoglobin: 12.4 g/dL — ABNORMAL LOW (ref 13.0–17.0)
Immature Granulocytes: 1 %
Lymphocytes Relative: 56 %
Lymphs Abs: 3.5 10*3/uL (ref 0.7–4.0)
MCH: 30.8 pg (ref 26.0–34.0)
MCHC: 33.9 g/dL (ref 30.0–36.0)
MCV: 91 fL (ref 80.0–100.0)
Monocytes Absolute: 0.4 10*3/uL (ref 0.1–1.0)
Monocytes Relative: 7 %
Neutro Abs: 2.2 10*3/uL (ref 1.7–7.7)
Neutrophils Relative %: 36 %
Platelets: 99 10*3/uL — ABNORMAL LOW (ref 150–400)
RBC: 4.02 MIL/uL — ABNORMAL LOW (ref 4.22–5.81)
RDW: 13.6 % (ref 11.5–15.5)
WBC: 6.1 10*3/uL (ref 4.0–10.5)
nRBC: 0 % (ref 0.0–0.2)

## 2023-03-09 LAB — URINALYSIS, ROUTINE W REFLEX MICROSCOPIC
Bacteria, UA: NONE SEEN
Bilirubin Urine: NEGATIVE
Glucose, UA: NEGATIVE mg/dL
Ketones, ur: NEGATIVE mg/dL
Leukocytes,Ua: NEGATIVE
Nitrite: NEGATIVE
Protein, ur: 30 mg/dL — AB
Specific Gravity, Urine: 1.023 (ref 1.005–1.030)
pH: 5.5 (ref 5.0–8.0)

## 2023-03-09 LAB — PROTIME-INR
INR: 2.2 — ABNORMAL HIGH (ref 0.8–1.2)
Prothrombin Time: 24.5 seconds — ABNORMAL HIGH (ref 11.4–15.2)

## 2023-03-09 LAB — COMPREHENSIVE METABOLIC PANEL
ALT: 15 U/L (ref 0–44)
AST: 28 U/L (ref 15–41)
Albumin: 3.7 g/dL (ref 3.5–5.0)
Alkaline Phosphatase: 30 U/L — ABNORMAL LOW (ref 38–126)
Anion gap: 7 (ref 5–15)
BUN: 20 mg/dL (ref 8–23)
CO2: 28 mmol/L (ref 22–32)
Calcium: 9 mg/dL (ref 8.9–10.3)
Chloride: 99 mmol/L (ref 98–111)
Creatinine, Ser: 1.46 mg/dL — ABNORMAL HIGH (ref 0.61–1.24)
GFR, Estimated: 49 mL/min — ABNORMAL LOW (ref >60)
Glucose, Bld: 100 mg/dL — ABNORMAL HIGH (ref 70–99)
Potassium: 4 mmol/L (ref 3.5–5.1)
Sodium: 134 mmol/L — ABNORMAL LOW (ref 135–145)
Total Bilirubin: 0.9 mg/dL (ref 0.3–1.2)
Total Protein: 7.1 g/dL (ref 6.5–8.1)

## 2023-03-09 LAB — RESP PANEL BY RT-PCR (RSV, FLU A&B, COVID)  RVPGX2
Influenza A by PCR: NEGATIVE
Influenza B by PCR: NEGATIVE
Resp Syncytial Virus by PCR: NEGATIVE
SARS Coronavirus 2 by RT PCR: NEGATIVE

## 2023-03-09 LAB — LACTIC ACID, PLASMA: Lactic Acid, Venous: 0.8 mmol/L (ref 0.5–1.9)

## 2023-03-09 MED ORDER — VANCOMYCIN HCL 1250 MG/250ML IV SOLN
1250.0000 mg | INTRAVENOUS | Status: DC
  Filled 2023-03-09: qty 250

## 2023-03-09 MED ORDER — VANCOMYCIN HCL IN DEXTROSE 1-5 GM/200ML-% IV SOLN
1000.0000 mg | Freq: Once | INTRAVENOUS | Status: DC
  Filled 2023-03-09: qty 200

## 2023-03-09 MED ORDER — VANCOMYCIN HCL 2000 MG/400ML IV SOLN
2000.0000 mg | Freq: Once | INTRAVENOUS | Status: DC
  Filled 2023-03-09: qty 400

## 2023-03-09 MED ORDER — VANCOMYCIN HCL IN DEXTROSE 1-5 GM/200ML-% IV SOLN: 1000.0000 mg | Freq: Once | INTRAVENOUS | Status: DC

## 2023-03-09 MED ORDER — ACETAMINOPHEN 325 MG PO TABS
650.0000 mg | ORAL_TABLET | Freq: Once | ORAL | Status: AC
  Administered 2023-03-09: 650 mg via ORAL
  Filled 2023-03-09: qty 2

## 2023-03-09 MED ORDER — SODIUM CHLORIDE 0.9 % IV SOLN
2.0000 g | Freq: Once | INTRAVENOUS | Status: AC
  Administered 2023-03-09: 2 g via INTRAVENOUS
  Filled 2023-03-09: qty 12.5

## 2023-03-09 MED ORDER — SODIUM CHLORIDE 0.9 % IV SOLN: Freq: Once | INTRAVENOUS | Status: DC

## 2023-03-09 MED ORDER — SODIUM CHLORIDE 0.9 % IV BOLUS (SEPSIS)
1000.0000 mL | Freq: Once | INTRAVENOUS | Status: AC
  Administered 2023-03-09: 1000 mL via INTRAVENOUS

## 2023-03-09 NOTE — Telephone Encounter (Signed)
Just noted Mychart message reporting fever 101.3 this evening. Instructed him to please go to ER now. Reminded him that this information is not MyChart appropriate--should have called office.

## 2023-03-09 NOTE — Progress Notes (Signed)
Pharmacy Antibiotic Note  Chad Gross is a 77 y.o. male admitted on 03/09/2023 with sepsis.  Pharmacy has been consulted for vancomycin dosing.  Plan: -Give vancomycin 2g IV x1, then vancomycin 1250mg  IV q24hrs (eAUC 466 using Scr 1.46 and Vd 0.72) -Vanc levels as needed  -Monitor renal function, cultures, and overall clinical picture    Height: 5\' 11"  (180.3 cm) Weight: 88.9 kg (195 lb 15.8 oz) IBW/kg (Calculated) : 75.3  Temp (24hrs), Avg:101.1 F (38.4 C), Min:100.3 F (37.9 C), Max:101.9 F (38.8 C)  Recent Labs  Lab 03/07/23 0950 03/09/23 1915 03/09/23 1930  WBC 6.0 6.1  --   CREATININE 1.39* 1.46*  --   LATICACIDVEN  --   --  0.8    Estimated Creatinine Clearance: 45.1 mL/min (A) (by C-G formula based on SCr of 1.46 mg/dL (H)).    No Known Allergies  Antimicrobials this admission: 4/10 cefepime 2g IV x1  4/10 vancomycin >>   Dose adjustments this admission: N/A  Microbiology results: 4/10 BCx: in process 4/10 resp panel: negative     Thank you for allowing pharmacy to be a part of this patient's care.  Cherylin Mylar, PharmD PGY1 Pharmacy Resident 4/10/202411:05 PM

## 2023-03-09 NOTE — Discharge Instructions (Addendum)
You have been seen in the Emergency Department (ED)  today for fever.  Your workup today including imaging, labs, urine studies were overall reassuring and unremarkable.  It is unclear what is causing your symptoms but likely could be a viral infection.  Please drink plenty of fluids.  You may use Tylenol or Motrin, as written on the box, as needed for fever or discomfort.  As we discussed, you did have blood cultures drawn today.  If they result positive, you will be called back to the hospital for treatment.  Return to the Emergency Department (ED)  if you have worsening trouble breathing, worsening confusion, chest pain, difficulty swallowing, neck pain or stiffness or other symptoms that concern you.  Please make an appointment to follow up with your primary care doctor within one week to assure improvement or resolution in symptoms.

## 2023-03-09 NOTE — ED Provider Notes (Signed)
[place] EMERGENCY DEPARTMENT AT [facility] Provider Note   CSN: [id number] Arrival date & time: 03/09/23  1751     History  Chief Complaint  Patient presents with   Fever    Chad Gross is a 77 y.o. male.  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 Authorizing Provider  acetaminophen (TYLENOL) 500 MG tablet Take 500-1,000 mg by mouth every 6 (six) hours as needed for moderate pain or headache.    [provider]  Calcium-Magnesium-Zinc [phone] MG TABS Take 1 tablet by mouth daily. 02/01/13   [provider]  Coenzyme Q10 (COQ10) 100 MG CAPS Take 100 mg by mouth daily.    [provider]  GLUCOSAMINE CHONDROITIN COMPLX PO Take 1 tablet by mouth daily.    [provider]  hydrochlorothiazide (HYDRODIURIL) 12.5 MG tablet Take 1 tablet (12.5 mg total) by mouth daily. 09/23/22   de [place], Buren Kos, MD  hydrocortisone (ANUSOL-HC) 2.5 % rectal cream PLACE 1 APPLICATION RECTALLY 2 (TWO) TIMES DAILY. Patient not taking: Reported on 03/07/2023 12/01/22   Corwin Levins, MD  losartan (COZAAR) 50 MG tablet Take 1 tablet (50 mg total) by mouth daily. 09/23/22   [place], Buren Kos, MD  meclizine (ANTIVERT) 12.5 MG tablet Take 1 tablet (12.5 mg total) by mouth 3 (three) times daily as needed for dizziness. 03/07/23   Ladene Artist, MD  Multiple Vitamins-Minerals (SENIOR MULTIVITAMIN PLUS PO) Take 1 tablet by mouth at bedtime.    [provider]  pravastatin (PRAVACHOL) 40 MG tablet Take 1 tablet (40 mg total) by mouth daily. 09/23/22   de [place], Raymond J, MD  PRESCRIPTION MEDICATION Apply topically 2 (two) times daily. Fluorouracil 5% + Calcipotriene 0.005%--apply to face Patient not taking: Reported on 03/07/2023    [provider]  prochlorperazine (COMPAZINE) 5 MG tablet  Take 1 tablet (5 mg total) by mouth every 6 (six) hours as needed for nausea or vomiting. 03/07/23   Ladene Artist, MD  traMADol (ULTRAM) 50 MG tablet Take 1 tablet (50 mg total) by mouth every 6 (six) hours as needed for moderate pain or severe pain. 03/03/23   Alyson Reedy, FNP  triamcinolone acetonide 40 MG/ML SUSP 40 mg, mupirocin cream 2 % CREA 15 g Apply 1 application  topically as needed. Patient not taking: Reported on 03/07/2023    [provider]  warfarin (COUMADIN) 10 MG tablet TAKE 1 TABLET BY MOUTH DAILY AT 6 PM 01/06/23   Ladene Artist, MD  warfarin (COUMADIN) 2.5 MG tablet Take 1 tablet (2.5 mg total) by mouth daily. Take with 10 mg tablet on Monday and Thursday for 12.5 mg dose 12/27/22   Ladene Artist, MD      Allergies    Patient has no known allergies.    Review of Systems   Review of Systems  Constitutional:  Positive for fever.    Physical Exam Updated Vital Signs BP (!) 150/79 (BP Location: Right Arm)   Pulse (!) 107   Temp 100.3 F (37.9 C) (Oral)   Resp 18   Ht 5\' 11"  (1.803 m)   Wt 88.9 kg   SpO2 96%   BMI 27.33 kg/m  Physical Exam  ED Results / Procedures / Treatments   Labs (all labs ordered are listed,  but only abnormal results are displayed) Labs Reviewed  CULTURE, BLOOD (ROUTINE X 2)  CULTURE, BLOOD (ROUTINE X 2)  RESP PANEL BY RT-PCR (RSV, FLU A&B, COVID)  RVPGX2  COMPREHENSIVE METABOLIC PANEL  LACTIC ACID, PLASMA  LACTIC ACID, PLASMA  CBC WITH DIFFERENTIAL/PLATELET  PROTIME-INR  URINALYSIS, ROUTINE W REFLEX MICROSCOPIC    EKG None  Radiology No results found.  Procedures Procedures  {Document cardiac monitor, telemetry assessment procedure when appropriate:1}  Medications Ordered in ED Medications - No data to display  ED Course/ Medical Decision Making/ A&P   {   Click here for ABCD2, HEART and other calculatorsREFRESH Note before signing :1}                          Medical Decision Making Amount and/or  Complexity of Data Reviewed Labs: ordered. Radiology: ordered.   ***  {Document critical care time when appropriate:1} {Document review of labs and clinical decision tools ie heart score, Chads2Vasc2 etc:1}  {Document your independent review of radiology images, and any outside records:1} {Document your discussion with family members, caretakers, and with consultants:1} {Document social determinants of health affecting pt's care:1} {Document your decision making why or why not admission, treatments were needed:1} Final Clinical Impression(s) / ED Diagnoses Final diagnoses:  None    Rx / DC Orders ED Discharge Orders     None

## 2023-03-09 NOTE — ED Triage Notes (Signed)
Patient here POV from Home.  Endorses having recent IV Immunotherapy on Monday for Skin Cancer. Second Treatment.   Began to have Aches yesterday associated with a Fever 101.3 1600. 100.0 Temp yesterday. Today began to have some confusion, balance problems, Mild nausea, Joint pain.   No Emesis or Diarrhea.   NAD Noted during Triage. A&Ox4. GCS 15. BIB Wheelchair.

## 2023-03-10 ENCOUNTER — Encounter: Payer: Self-pay | Admitting: Oncology

## 2023-03-10 ENCOUNTER — Telehealth: Payer: Self-pay | Admitting: *Deleted

## 2023-03-10 LAB — CULTURE, BLOOD (ROUTINE X 2)

## 2023-03-10 NOTE — Telephone Encounter (Signed)
Went to ER last night due to fever/confusion. Had IV antibiotics. CT head was negative. Blood culture has been negative (12 hours). He is still unsteady at times, confused and having some difficulty following commands. Temps have been 101.9 today and will go down with Tylenol and then go back up again. Wishes to talk to or see Dr. Truett Perna about this today or tomorrow.

## 2023-03-11 ENCOUNTER — Other Ambulatory Visit: Payer: Self-pay

## 2023-03-11 ENCOUNTER — Encounter (HOSPITAL_COMMUNITY): Payer: Self-pay

## 2023-03-11 ENCOUNTER — Encounter: Payer: Self-pay | Admitting: Oncology

## 2023-03-11 ENCOUNTER — Encounter (HOSPITAL_BASED_OUTPATIENT_CLINIC_OR_DEPARTMENT_OTHER): Payer: Self-pay | Admitting: Family Medicine

## 2023-03-11 ENCOUNTER — Emergency Department (HOSPITAL_COMMUNITY)
Admission: EM | Admit: 2023-03-11 | Discharge: 2023-03-12 | Disposition: A | Discharge status: Home / Self Care | Payer: MEDICARE | Attending: Emergency Medicine | Admitting: Emergency Medicine

## 2023-03-11 ENCOUNTER — Telehealth: Payer: Self-pay

## 2023-03-11 DIAGNOSIS — R630 Anorexia: Secondary | ICD-10-CM | POA: Diagnosis not present

## 2023-03-11 DIAGNOSIS — Z7901 Long term (current) use of anticoagulants: Secondary | ICD-10-CM | POA: Insufficient documentation

## 2023-03-11 DIAGNOSIS — R531 Weakness: Secondary | ICD-10-CM | POA: Diagnosis not present

## 2023-03-11 DIAGNOSIS — R2689 Other abnormalities of gait and mobility: Secondary | ICD-10-CM

## 2023-03-11 DIAGNOSIS — R509 Fever, unspecified: Secondary | ICD-10-CM | POA: Diagnosis not present

## 2023-03-11 DIAGNOSIS — Z86718 Personal history of other venous thrombosis and embolism: Secondary | ICD-10-CM | POA: Diagnosis not present

## 2023-03-11 DIAGNOSIS — E871 Hypo-osmolality and hyponatremia: Secondary | ICD-10-CM

## 2023-03-11 DIAGNOSIS — Z5111 Encounter for antineoplastic chemotherapy: Secondary | ICD-10-CM | POA: Diagnosis not present

## 2023-03-11 LAB — CBC
HCT: 36.2 % — ABNORMAL LOW (ref 39.0–52.0)
Hemoglobin: 12.3 g/dL — ABNORMAL LOW (ref 13.0–17.0)
MCH: 30.9 pg (ref 26.0–34.0)
MCHC: 34 g/dL (ref 30.0–36.0)
MCV: 91 fL (ref 80.0–100.0)
Platelets: 104 10*3/uL — ABNORMAL LOW (ref 150–400)
RBC: 3.98 MIL/uL — ABNORMAL LOW (ref 4.22–5.81)
RDW: 13.6 % (ref 11.5–15.5)
WBC: 7.6 10*3/uL (ref 4.0–10.5)
nRBC: 0 % (ref 0.0–0.2)

## 2023-03-11 LAB — BASIC METABOLIC PANEL
Anion gap: 10 (ref 5–15)
BUN: 22 mg/dL (ref 8–23)
CO2: 23 mmol/L (ref 22–32)
Calcium: 8.5 mg/dL — ABNORMAL LOW (ref 8.9–10.3)
Chloride: 100 mmol/L (ref 98–111)
Creatinine, Ser: 1.39 mg/dL — ABNORMAL HIGH (ref 0.61–1.24)
GFR, Estimated: 52 mL/min — ABNORMAL LOW (ref >60)
Glucose, Bld: 95 mg/dL (ref 70–99)
Potassium: 3.6 mmol/L (ref 3.5–5.1)
Sodium: 133 mmol/L — ABNORMAL LOW (ref 135–145)

## 2023-03-11 LAB — CBG MONITORING, ED: Glucose-Capillary: 98 mg/dL (ref 70–99)

## 2023-03-11 NOTE — ED Provider Notes (Signed)
[place] EMERGENCY DEPARTMENT AT [facility] Provider Note   CSN: [id number] Arrival date & time: 03/11/23  1739     History  Chief Complaint  Patient presents with   Weakness   Fever    Chad Gross is a 77 y.o. male.  The history is provided by the patient and medical records.  Weakness Associated symptoms: fever   Fever Chad Gross is a 77 y.o. male who presents to the Emergency Department complaining of balance issues.  He presents to the emergency department for evaluation of balance issues that started several days ago.  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  On Wednesday he felt confused and felt like he was leaning backwards when walking and had an unsteady gait with associated fever.  He was evaluated in the emergency department with labs and CT head, which were negative for acute abnormality and he was discharged home.  He did have persistent issues on Thursday with a fever to 101.5 on that day.  He had reached out to oncology and when it was recommended that he come into the emergency department for further evaluation.  He has been fever free since Thursday and states that he feels well and his gait is back at his baseline.  He has no headache, neck pain, chest pain, abdominal pain, nausea, vomiting, cough, difficulty breathing, wounds.  No dysuria.  Balance issues Immunotherapy Monday.  He reports poor appetite but that is at his baseline.  He also at baseline has some difficulty, which he describes as minor with walking with a little bit of a lean to the left which is chronic in nature.      Home Medications Prior to Admission medications   Medication Sig Start Date End Date Taking? Authorizing Provider  acetaminophen (TYLENOL) 500 MG tablet Take 500-1,000 mg by mouth every 6 (six) hours as needed for moderate pain or headache.    [provider]  Calcium-Magnesium-Zinc  [phone] MG TABS Take 1 tablet by mouth daily. 02/01/13   [provider]  Coenzyme Q10 (COQ10) 100 MG CAPS Take 100 mg by mouth daily.    [provider]  GLUCOSAMINE CHONDROITIN COMPLX PO Take 1 tablet by mouth daily.    [provider]  hydrochlorothiazide (HYDRODIURIL) 12.5 MG tablet Take 1 tablet (12.5 mg total) by mouth daily. 09/23/22   [place], Buren Kos, MD  hydrocortisone (ANUSOL-HC) 2.5 % rectal cream PLACE 1 APPLICATION RECTALLY 2 (TWO) TIMES DAILY. Patient not taking: Reported on 03/07/2023 12/01/22   Corwin Levins, MD  losartan (COZAAR) 50 MG tablet Take 1 tablet (50 mg total) by mouth daily. 09/23/22   [place], Buren Kos, MD  meclizine (ANTIVERT) 12.5 MG tablet Take 1 tablet (12.5 mg total) by mouth 3 (three) times daily as needed for dizziness. 03/07/23   Ladene Artist, MD  Multiple Vitamins-Minerals (SENIOR MULTIVITAMIN PLUS PO) Take 1 tablet by mouth at bedtime.    [provider]  pravastatin (PRAVACHOL) 40 MG tablet Take 1 tablet (40 mg total) by mouth daily. 09/23/22   de [place], Raymond J, MD  PRESCRIPTION MEDICATION Apply topically 2 (two) times daily. Fluorouracil 5% + Calcipotriene 0.005%--apply to face Patient not taking: Reported on 03/07/2023    [provider]  prochlorperazine (COMPAZINE) 5 MG tablet Take 1 tablet (5 mg total) by mouth every 6 (six) hours as needed for nausea or vomiting. 03/07/23   Sherrill,  Leighton Roach, MD  traMADol (ULTRAM) 50 MG tablet Take 1 tablet (50 mg total) by mouth every 6 (six) hours as needed for moderate pain or severe pain. 03/03/23   Alyson Reedy, FNP  triamcinolone acetonide 40 MG/ML SUSP 40 mg, mupirocin cream 2 % CREA 15 g Apply 1 application  topically as needed. Patient not taking: Reported on 03/07/2023    [provider]  warfarin (COUMADIN) 10 MG tablet TAKE 1 TABLET BY MOUTH DAILY AT 6 PM 01/06/23   Ladene Artist, MD  warfarin (COUMADIN) 2.5 MG tablet Take 1 tablet (2.5 mg total) by mouth  daily. Take with 10 mg tablet on Monday and Thursday for 12.5 mg dose 12/27/22   Ladene Artist, MD      Allergies    Patient has no known allergies.    Review of Systems   Review of Systems  Constitutional:  Positive for fever.  Neurological:  Positive for weakness.  All other systems reviewed and are negative.   Physical Exam Updated Vital Signs BP 138/64 (BP Location: Left Arm)   Pulse 92   Temp 99.2 F (37.3 C) (Oral)   Resp 18   Ht 5\' 11"  (1.803 m)   Wt 88 kg   SpO2 92%   BMI 27.06 kg/m  Physical Exam Vitals and nursing note reviewed.  Constitutional:      Appearance: He is well-developed.  HENT:     Head: Normocephalic and atraumatic.  Cardiovascular:     Rate and Rhythm: Normal rate and regular rhythm.     Heart sounds: No murmur heard. Pulmonary:     Effort: Pulmonary effort is normal. No respiratory distress.     Breath sounds: Normal breath sounds.  Abdominal:     Palpations: Abdomen is soft.     Tenderness: There is no abdominal tenderness. There is no guarding or rebound.  Musculoskeletal:        General: No tenderness.  Skin:    General: Skin is warm and dry.  Neurological:     Mental Status: He is alert and oriented to person, place, and time.     Comments: No asymmetry of facial movements.  Visual fields grossly intact.  5/5 strength in all four extremities.  Slightly unsteady gait - patient and friend at bedside state this is baseline.   Psychiatric:        Behavior: Behavior normal.     ED Results / Procedures / Treatments   Labs (all labs ordered are listed, but only abnormal results are displayed) Labs Reviewed  BASIC METABOLIC PANEL - Abnormal; Notable for the following components:      Result Value   Sodium 133 (*)    Creatinine, Ser 1.39 (*)    Calcium 8.5 (*)    GFR, Estimated 52 (*)    All other components within normal limits  CBC - Abnormal; Notable for the following components:   RBC 3.98 (*)    Hemoglobin 12.3 (*)    HCT  36.2 (*)    Platelets 104 (*)    All other components within normal limits  URINALYSIS, ROUTINE W REFLEX MICROSCOPIC  CBG MONITORING, ED    EKG None  Radiology No results found.  Procedures Procedures    Medications Ordered in ED Medications - No data to display  ED Course/ Medical Decision Making/ A&P  Medical Decision Making Amount and/or Complexity of Data Reviewed Labs: ordered.   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  Patient has a nonfocal neurologic examination.  He does have some mild unsteadiness of gait with a slight lean to the left and patient and friend at the bedside agree that this is his baseline gait.  BMP with mild hyponatremia.  CBC is near his baseline.  He did have coags performed 2 days ago with an appropriate INR of 2.2.  He has no urinary symptoms.  Current clinical picture is not consistent with sepsis, CVA.  Suspect that his gait disturbance may have been secondary to underlying febrile illness, which has since resolved.  Feel patient is stable for discharge home with close outpatient follow-up and return precautions.       Final Clinical Impression(s) / ED Diagnoses Final diagnoses:  None    Rx / DC Orders ED Discharge Orders     None         Tilden Fossa, MD 03/12/23 [phone]

## 2023-03-11 NOTE — ED Triage Notes (Signed)
Pt has had unsteady gait, weakness, and fever for the last few days. Pt PCP is concerned that the issue has not resolved. Additional c/o chills.  Seen at [facility] a few days ago for same issues and told everything was normal.

## 2023-03-11 NOTE — Telephone Encounter (Signed)
        Patient  visited [facility] on 03/09/2023  for fever.   Telephone encounter attempt :  1st  A HIPAA compliant voice message was left requesting a return call.  Instructed patient to call back at [phone].   Douglas Smolinsky [facility]  [facility] Community Resource Care Guide   [email or web address]  ?? [phone]   Website: [email or web address]  [email or web address]

## 2023-03-11 NOTE — ED Provider Triage Note (Signed)
Emergency Medicine Provider Triage Evaluation Note  Chad Gross , a 77 y.o. male  was evaluated in triage.  Pt complains of unsteady gait and weakness for the past few days.  Patient's PCP concerned issue has not resolved.  He is also complaining of chills.  Patient was seen at [facility] for same few days ago.  He states his sore throat and cough feels a bit worse than before.  Patient has not had any falls, but has had to be "caught" in order to not fall.    Review of Systems  Positive: As above Negative: As above  Physical Exam  BP 138/64 (BP Location: Left Arm)   Pulse 92   Temp 99.2 F (37.3 C) (Oral)   Resp 18   Ht 5\' 11"  (1.803 m)   Wt 88 kg   SpO2 92%   BMI 27.06 kg/m  Gen:   Awake, no distress   Resp:  Normal effort  MSK:   Moves extremities without difficulty  Other:    Medical Decision Making  Medically screening exam initiated at 6:29 PM.  Appropriate orders placed.  [name] was informed that the remainder of the evaluation will be completed by another provider, this initial triage assessment does not replace that evaluation, and the importance of remaining in the ED until their evaluation is complete.     Lenard Simmer, PA-C 03/11/23 [phone]

## 2023-03-14 LAB — CULTURE, BLOOD (ROUTINE X 2)
Culture: NO GROWTH
Special Requests: ADEQUATE

## 2023-03-15 ENCOUNTER — Telehealth: Payer: Self-pay

## 2023-03-15 NOTE — Telephone Encounter (Signed)
     Patient  visit on 4/13  at [facility]  Have you been able to follow up with your primary care physician? Yes      The patient was or was not able to obtain any needed medicine or equipment. Yes   Are there diet recommendations that you are having difficulty following? Na   Patient expresses understanding of discharge instructions and education provided has no other needs at this time.  Yes     Chad Gross [facility] Care Guide, [place] [phone] [address], [place], [place] [address] Phone: [phone] Email: [email or web address]    

## 2023-03-17 ENCOUNTER — Telehealth: Payer: Self-pay | Admitting: *Deleted

## 2023-03-17 DIAGNOSIS — C9110 Chronic lymphocytic leukemia of B-cell type not having achieved remission: Secondary | ICD-10-CM

## 2023-03-17 DIAGNOSIS — Z7901 Long term (current) use of anticoagulants: Secondary | ICD-10-CM

## 2023-03-17 NOTE — Telephone Encounter (Signed)
Called to report that ever since a venipuncture in his right arm on 03/11/23 a bruise has been increased in size and is now from his inner wrist up to his elbow. It is purple in color and tender. No other bleeding or bruising. Dr. Truett Perna made aware and suggests CBC and INR today or tomorrow. He requested tomorrow at 10:30. Appointment scheduled.

## 2023-03-18 ENCOUNTER — Inpatient Hospital Stay: Payer: MEDICARE

## 2023-03-18 ENCOUNTER — Telehealth: Payer: Self-pay | Admitting: *Deleted

## 2023-03-18 ENCOUNTER — Inpatient Hospital Stay: Payer: MEDICARE | Admitting: Oncology

## 2023-03-18 ENCOUNTER — Encounter: Payer: Self-pay | Admitting: Oncology

## 2023-03-18 DIAGNOSIS — Z7901 Long term (current) use of anticoagulants: Secondary | ICD-10-CM

## 2023-03-18 DIAGNOSIS — C9110 Chronic lymphocytic leukemia of B-cell type not having achieved remission: Secondary | ICD-10-CM

## 2023-03-18 DIAGNOSIS — Z5111 Encounter for antineoplastic chemotherapy: Secondary | ICD-10-CM | POA: Diagnosis not present

## 2023-03-18 LAB — CBC WITH DIFFERENTIAL (CANCER CENTER ONLY)
Abs Immature Granulocytes: 0.09 10*3/uL — ABNORMAL HIGH (ref 0.00–0.07)
Basophils Absolute: 0 10*3/uL (ref 0.0–0.1)
Basophils Relative: 0 %
Eosinophils Absolute: 0 10*3/uL (ref 0.0–0.5)
Eosinophils Relative: 0 %
HCT: 37.8 % — ABNORMAL LOW (ref 39.0–52.0)
Hemoglobin: 12.6 g/dL — ABNORMAL LOW (ref 13.0–17.0)
Immature Granulocytes: 1 %
Lymphocytes Relative: 64 %
Lymphs Abs: 5.6 10*3/uL — ABNORMAL HIGH (ref 0.7–4.0)
MCH: 30.7 pg (ref 26.0–34.0)
MCHC: 33.3 g/dL (ref 30.0–36.0)
MCV: 92 fL (ref 80.0–100.0)
Monocytes Absolute: 0.7 10*3/uL (ref 0.1–1.0)
Monocytes Relative: 8 %
Neutro Abs: 2.4 10*3/uL (ref 1.7–7.7)
Neutrophils Relative %: 27 %
Platelet Count: 137 10*3/uL — ABNORMAL LOW (ref 150–400)
RBC: 4.11 MIL/uL — ABNORMAL LOW (ref 4.22–5.81)
RDW: 13.9 % (ref 11.5–15.5)
WBC Count: 8.7 10*3/uL (ref 4.0–10.5)
nRBC: 0 % (ref 0.0–0.2)

## 2023-03-18 LAB — PROTIME-INR
INR: 2.4 — ABNORMAL HIGH (ref 0.8–1.2)
Prothrombin Time: 25.8 seconds — ABNORMAL HIGH (ref 11.4–15.2)

## 2023-03-18 NOTE — Telephone Encounter (Signed)
This RN examined his bruise inner right forearm--it appears to be healing. MD agreed. Notified Chad Gross that platelets are good and INR 2.4. Continue current warfarin of  10 mg daily, except 12.5 mg M & Th. F/U as scheduled.

## 2023-03-24 ENCOUNTER — Other Ambulatory Visit: Payer: Self-pay | Admitting: Oncology

## 2023-03-27 ENCOUNTER — Other Ambulatory Visit: Payer: Self-pay | Admitting: Oncology

## 2023-03-28 ENCOUNTER — Inpatient Hospital Stay (HOSPITAL_BASED_OUTPATIENT_CLINIC_OR_DEPARTMENT_OTHER): Payer: MEDICARE | Admitting: Oncology

## 2023-03-28 ENCOUNTER — Inpatient Hospital Stay: Payer: MEDICARE

## 2023-03-28 ENCOUNTER — Encounter: Payer: Self-pay | Admitting: *Deleted

## 2023-03-28 VITALS — BP 127/75 | HR 77 | Temp 98.2°F | Resp 18 | Ht 71.0 in | Wt 195.2 lb

## 2023-03-28 VITALS — BP 113/65 | HR 64 | Resp 18

## 2023-03-28 DIAGNOSIS — C9110 Chronic lymphocytic leukemia of B-cell type not having achieved remission: Secondary | ICD-10-CM

## 2023-03-28 DIAGNOSIS — Z7901 Long term (current) use of anticoagulants: Secondary | ICD-10-CM | POA: Diagnosis not present

## 2023-03-28 DIAGNOSIS — Z5111 Encounter for antineoplastic chemotherapy: Secondary | ICD-10-CM | POA: Diagnosis not present

## 2023-03-28 LAB — CBC WITH DIFFERENTIAL (CANCER CENTER ONLY)
Abs Immature Granulocytes: 0.04 10*3/uL (ref 0.00–0.07)
Basophils Absolute: 0 10*3/uL (ref 0.0–0.1)
Basophils Relative: 0 %
Eosinophils Absolute: 0 10*3/uL (ref 0.0–0.5)
Eosinophils Relative: 0 %
HCT: 37.8 % — ABNORMAL LOW (ref 39.0–52.0)
Hemoglobin: 12.9 g/dL — ABNORMAL LOW (ref 13.0–17.0)
Immature Granulocytes: 0 %
Lymphocytes Relative: 61 %
Lymphs Abs: 5.7 10*3/uL — ABNORMAL HIGH (ref 0.7–4.0)
MCH: 31.2 pg (ref 26.0–34.0)
MCHC: 34.1 g/dL (ref 30.0–36.0)
MCV: 91.5 fL (ref 80.0–100.0)
Monocytes Absolute: 0.7 10*3/uL (ref 0.1–1.0)
Monocytes Relative: 7 %
Neutro Abs: 3.1 10*3/uL (ref 1.7–7.7)
Neutrophils Relative %: 32 %
Platelet Count: 100 10*3/uL — ABNORMAL LOW (ref 150–400)
RBC: 4.13 MIL/uL — ABNORMAL LOW (ref 4.22–5.81)
RDW: 13.8 % (ref 11.5–15.5)
WBC Count: 9.5 10*3/uL (ref 4.0–10.5)
nRBC: 0 % (ref 0.0–0.2)

## 2023-03-28 LAB — CMP (CANCER CENTER ONLY)
ALT: 10 U/L (ref 0–44)
AST: 18 U/L (ref 15–41)
Albumin: 3.9 g/dL (ref 3.5–5.0)
Alkaline Phosphatase: 39 U/L (ref 38–126)
Anion gap: 8 (ref 5–15)
BUN: 22 mg/dL (ref 8–23)
CO2: 26 mmol/L (ref 22–32)
Calcium: 9.3 mg/dL (ref 8.9–10.3)
Chloride: 103 mmol/L (ref 98–111)
Creatinine: 1.35 mg/dL — ABNORMAL HIGH (ref 0.61–1.24)
GFR, Estimated: 54 mL/min — ABNORMAL LOW (ref >60)
Glucose, Bld: 87 mg/dL (ref 70–99)
Potassium: 3.8 mmol/L (ref 3.5–5.1)
Sodium: 137 mmol/L (ref 135–145)
Total Bilirubin: 0.5 mg/dL (ref 0.3–1.2)
Total Protein: 7.4 g/dL (ref 6.5–8.1)

## 2023-03-28 LAB — TSH: TSH: 4.379 u[IU]/mL (ref 0.350–4.500)

## 2023-03-28 LAB — PROTIME-INR
INR: 2.5 — ABNORMAL HIGH (ref 0.8–1.2)
Prothrombin Time: 26.9 seconds — ABNORMAL HIGH (ref 11.4–15.2)

## 2023-03-28 MED ORDER — SODIUM CHLORIDE 0.9 % IV SOLN
350.0000 mg | Freq: Once | INTRAVENOUS | Status: AC
  Administered 2023-03-28: 350 mg via INTRAVENOUS
  Filled 2023-03-28: qty 7

## 2023-03-28 MED ORDER — SODIUM CHLORIDE 0.9 % IV SOLN: Freq: Once | INTRAVENOUS | Status: AC

## 2023-03-28 NOTE — Patient Instructions (Signed)
[facility]   Discharge Instructions: Thank you for choosing [facility] to provide your oncology and hematology care.   If you have a lab appointment with the Cancer Center, please go directly to the Cancer Center and check in at the registration area.   Wear comfortable clothing and clothing appropriate for easy access to any Portacath or PICC line.   We strive to give you quality time with your provider. You may need to reschedule your appointment if you arrive late (15 or more minutes).  Arriving late affects you and other patients whose appointments are after yours.  Also, if you miss three or more appointments without notifying the office, you may be dismissed from the clinic at the provider's discretion.      For prescription refill requests, have your pharmacy contact our office and allow 72 hours for refills to be completed.    Today you received the following chemotherapy and/or immunotherapy agents Cemiplimab-rwlc (LIBTAYO)      To help prevent nausea and vomiting after your treatment, we encourage you to take your nausea medication as directed.  BELOW ARE SYMPTOMS THAT SHOULD BE REPORTED IMMEDIATELY: *FEVER GREATER THAN 100.4 F (38 C) OR HIGHER *CHILLS OR SWEATING *NAUSEA AND VOMITING THAT IS NOT CONTROLLED WITH YOUR NAUSEA MEDICATION *UNUSUAL SHORTNESS OF BREATH *UNUSUAL BRUISING OR BLEEDING *URINARY PROBLEMS (pain or burning when urinating, or frequent urination) *BOWEL PROBLEMS (unusual diarrhea, constipation, pain near the anus) TENDERNESS IN MOUTH AND THROAT WITH OR WITHOUT PRESENCE OF ULCERS (sore throat, sores in mouth, or a toothache) UNUSUAL RASH, SWELLING OR PAIN  UNUSUAL VAGINAL DISCHARGE OR ITCHING   Items with * indicate a potential emergency and should be followed up as soon as possible or go to the Emergency Department if any problems should occur.  Please show the CHEMOTHERAPY ALERT CARD or IMMUNOTHERAPY  ALERT CARD at check-in to the Emergency Department and triage nurse.  Should you have questions after your visit or need to cancel or reschedule your appointment, please contact [facility]  Dept: [phone]  and follow the prompts.  Office hours are 8:00 a.m. to 4:30 p.m. Monday - Friday. Please note that voicemails left after 4:00 p.m. may not be returned until the following business day.  We are closed weekends and major holidays. You have access to a nurse at all times for urgent questions. Please call the main number to the clinic Dept: [phone] and follow the prompts.   For any non-urgent questions, you may also contact your provider using MyChart. We now offer e-Visits for anyone 50 and older to request care online for non-urgent symptoms. For details visit [email or web address].   Also download the MyChart app! Go to the app store, search "MyChart", open the app, select [facility], and log in with your MyChart username and password.  Cemiplimab Injection What is this medication? CEMIPLIMAB (se MIP li mab) treats skin cancer and lung cancer. It works by helping your immune system slow or stop the spread of cancer cells. It is a monoclonal antibody. This medicine may be used for other purposes; ask your health care provider or pharmacist if you have questions. COMMON BRAND NAME(S): LIBTAYO What should I tell my care team before I take this medication? They need to know if you have any of these conditions: Allogeneic stem cell transplant (uses someone else's stem cells) Autoimmune diseases, such as Crohn's disease, ulcerative colitis, or lupus Diabetes Nervous system problems,  such as myasthenia gravis or Guillain-Barre syndrome Organ transplant Recent or ongoing radiation Thyroid disease An unusual or allergic reaction to cemiplimab, other medications, foods, dyes, or preservatives Pregnant or trying to get pregnant Breast-feeding How should  I use this medication? This medication is infused into a vein. It is given by your care team in a hospital or clinic setting. A special MedGuide will be given to you before each treatment. Be sure to read this information carefully each time. Talk to your care team about the use of this medication in children. Special care may be needed. Overdosage: If you think you have taken too much of this medicine contact a poison control center or emergency room at once. NOTE: This medicine is only for you. Do not share this medicine with others. What if I miss a dose? Keep appointments for follow-up doses. It is important not to miss your dose. Call your care team if you are unable to keep an appointment. What may interact with this medication? Interactions have not been studied. This list may not describe all possible interactions. Give your health care provider a list of all the medicines, herbs, non-prescription drugs, or dietary supplements you use. Also tell them if you smoke, drink alcohol, or use illegal drugs. Some items may interact with your medicine. What should I watch for while using this medication? This medication may make you feel generally unwell. This is not uncommon as chemotherapy can affect healthy cells as well as cancer cells. Report any side effects. Continue your course of treatment even though you feel ill until your care team tells you to stop. You may need blood work done while you are taking this medication. This medication may cause serious skin reactions. They can happen in the weeks to months after starting the medication. Contact your care team right away if you notice fevers or flu-like symptoms with a rash. The rash may be red or purple and then turn into blisters or peeling of the skin. You may also notice a red rash with swelling of the face, lips, or lymph nodes in your neck or under your arms. Tell your care team right away if you have any changes in your vision. This  medication may increase blood sugar. The risk may be higher in patients who already have diabetes. Ask your care team what you can do to lower your risk of diabetes while taking this medication. Talk to your care team if you wish to become pregnant or think you might be pregnant. This medication can cause serious birth defects if taken during pregnancy. A negative pregnancy test is required before starting this medication. A reliable form of contraception is recommended while taking this medication and for at least 4 months after stopping it. Do not breast-feed while taking this medication and for at least 4 months after stopping it. What side effects may I notice from receiving this medication? Side effects that you should report to your care team as soon as possible: Allergic reactions--skin rash, itching, hives, swelling of the face, lips, tongue, or throat Dry cough, shortness of breath or trouble breathing Eye pain, redness, irritation, or discharge with blurry or decreased vision Heart muscle inflammation--unusual weakness or fatigue, shortness of breath, chest pain, fast or irregular heartbeat, dizziness, swelling of the ankles, feet, or hands Hormone gland problems--headache, sensitivity to light, unusual weakness or fatigue, dizziness, fast or irregular heartbeat, increased sensitivity to cold or heat, excessive sweating, constipation, hair loss, increased thirst or amount of urine,  tremors or shaking, irritability Infusion reactions--chest pain, shortness of breath or trouble breathing, feeling faint or lightheaded Kidney injury (glomerulonephritis)--decrease in the amount of urine, red or dark brown urine, foamy or bubbly urine, swelling of the ankles, hands, or feet Liver injury--right upper belly pain, loss of appetite, nausea, light-colored stool, dark yellow or brown urine, yellowing skin or eyes, unusual weakness or fatigue Pain, tingling, or numbness in the hands or feet, muscle  weakness, change in vision, confusion or trouble speaking, loss of balance or coordination, trouble walking, seizures Rash, fever, and swollen lymph nodes Redness, blistering, peeling, or loosening of the skin, including inside the mouth Sudden or severe stomach pain, bloody diarrhea, fever, nausea, vomiting Side effects that usually do not require medical attention (report these to your care team if they continue or are bothersome): Bone, joint, or muscle pain Diarrhea Fatigue Loss of appetite Nausea Skin rash This list may not describe all possible side effects. Call your doctor for medical advice about side effects. You may report side effects to FDA at 1-800-FDA-1088. Where should I keep my medication? This medication is given in a hospital or clinic and will not be stored at home. NOTE: This sheet is a summary. It may not cover all possible information. If you have questions about this medicine, talk to your doctor, pharmacist, or health care provider.  2023 Elsevier/Gold Standard (2021-10-16 00:00:00)

## 2023-03-28 NOTE — Progress Notes (Signed)
  Patient seen by Dr. [name] today  Vitals are within treatment parameters.   Labs reviewed by Dr. [name] and are within treatment parameters.  Per physician team, patient is ready for treatment and there are NO modifications to the treatment plan.

## 2023-03-28 NOTE — Progress Notes (Signed)
F/U appt with Dr Marlane Mingle at [facility] scheduled for 04/13/23 at 2:00 with a 1:30 pm arrival

## 2023-03-28 NOTE — Progress Notes (Signed)
[facility] OFFICE PROGRESS NOTE   Diagnosis: CLL, squamous cell skin cancer  INTERVAL HISTORY:   Chad Gross returns as scheduled.  He completed another cycle of Cemiplimab 03/07/2023.  He felt a fever and confusion on 03/09/2023.  He also had an unsteady gait.  He was seen in the emergency room on 03/09/2023.  A CT head revealed no acute change.  Urinalysis was negative.  A blood culture returned negative.  Seen again in the emergency room 03/11/2023.  The fever had resolved by 03/10/2023.  The symptoms resolved and have not returned.  He has noted continued improvement in the scalp and face lesions.  He has a poor appetite, but is eating.  No rash or diarrhea.  He developed edema at the penis and was evaluated by Dr. Annabell Howells.  This has resolved.  Vital signs in last 24 hours:  Blood pressure 127/75, pulse 77, temperature 98.2 F (36.8 C), temperature source Oral, resp. rate 18, height 5\' 11"  (1.803 m), weight 195 lb 3.2 oz (88.5 kg), SpO2 98 %.    HEENT: No thrush Lymphatics: No cervical, supraclavicular, axillary, or inguinal nodes Resp: Lungs clear bilaterally Cardio: Regular rate and rhythm GI: No hepatosplenomegaly, firm mass at the mid and left upper abdomen Vascular: No leg edema Neuro: Alert and oriented Skin: Nodular lesions over the forehead, right temple area, and parietal scalp appear smaller   Lab Results:  Lab Results  Component Value Date   WBC 9.5 03/28/2023   HGB 12.9 (L) 03/28/2023   HCT 37.8 (L) 03/28/2023   MCV 91.5 03/28/2023   PLT 100 (L) 03/28/2023   NEUTROABS 3.1 03/28/2023    CMP  Lab Results  Component Value Date   NA 133 (L) 03/11/2023   K 3.6 03/11/2023   CL 100 03/11/2023   CO2 23 03/11/2023   GLUCOSE 95 03/11/2023   BUN 22 03/11/2023   CREATININE 1.39 (H) 03/11/2023   CALCIUM 8.5 (L) 03/11/2023   PROT 7.1 03/09/2023   ALBUMIN 3.7 03/09/2023   AST 28 03/09/2023   ALT 15 03/09/2023   ALKPHOS 30 (L) 03/09/2023   BILITOT 0.9  03/09/2023   GFRNONAA 52 (L) 03/11/2023   GFRAA 52 (L) 08/13/2020     Lab Results  Component Value Date   INR 2.4 (H) 03/18/2023   LABPROT 25.8 (H) 03/18/2023     Medications: I have reviewed the patient's current medications.   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 He will return for a monthly PT check.  Indwelling IVC catheter.  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        4. 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 erectile dysfunction-followed by Dr. Annabell Howells , status post placement of a penile implant at [facility] in April 2014            7. CT of the abdomen/pelvis at [facility] urology February 2014 for evaluation of hematuria-12 mm external iliac nodes and left periaortic retroperitoneal lymph node-11 mm-potentially related to CLL      8.  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, peripheral lymphocytosis and small palpable lymph nodes          CT abdomen/pelvis 09/18/2019-extensive abdominal pelvic lymphadenopathy including a left periaortic node measuring 5.7 cm 08/13/2020 CLL FISH panel-no evidence of trisomy 12; no evidence of [id number]; no evidence  of p53 deletion or amplification; deletion of ATM present 11/05/2020-peripheral blood TP53 mutation-negative Cycle 1 bendamustine/Rituxan 10/08/2020, 10/09/2020 Cycle 2 bendamustine/Rituxan 11/05/2020, 11/06/2020 Cycle 3 bendamustine/Rituxan 12/03/2020, 12/04/2020 Cycle 4 bendamustine/Rituxan 12/31/2020, 01/01/2021 CTs 01/19/2021-decrease in bulky abdomen/pelvic lymphadenopathy with more widespread ill-defined "haziness "in the mesentery and retroperitoneal fat, decreased pelvic lymphadenopathy Cycle 5 bendamustine/Rituxan 01/28/2021, 01/29/2021 CT 02/22/2023-mild generalized lymphadenopathy in the neck, significant increase in chest andabdominal/pelvic  lymph nodes, dominant nodal mass in the mesentery Cycle 6 bendamustine/rituximab 02/25/2021, 02/26/2021     9.  Basal cell carcinoma removed from the left superior central forehead 01/21/2020    10.  Renal insufficiency    11.  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  Indeterminate left hepatic lobe lesion on CT 02/22/2023 13.  Fever and altered mental status following cycle 2 Cemiplimab-no source for infection identified,       Disposition: Chad Loy appears stable.  He has completed 2 cycles of Cemiplimab.  The nodular skin lesions over the forehead and face have decreased in size.  He will complete cycle 3 Cemiplimab today.  He developed a fever and altered mental status a few days following cycle 2 Cemiplimab.  An infection workup was negative in the emergency room.  The symptoms resolved over a few days.  He may have had a viral infection.  He will call for recurrent symptoms following this cycle of Cemiplimab.  Chad Speyer will return for an office visit and cycle 4 Cemiplimab in 3 weeks.  We will refer him to Dr. Tama Gander for a follow-up visit.   [name], MD  03/28/2023  9:59 AM

## 2023-03-30 ENCOUNTER — Ambulatory Visit (HOSPITAL_BASED_OUTPATIENT_CLINIC_OR_DEPARTMENT_OTHER): Payer: MEDICARE | Admitting: Family Medicine

## 2023-03-30 LAB — T4: T4, Total: 9.4 ug/dL (ref 4.5–12.0)

## 2023-04-11 ENCOUNTER — Other Ambulatory Visit: Payer: Self-pay | Admitting: Oncology

## 2023-04-13 ENCOUNTER — Ambulatory Visit (HOSPITAL_BASED_OUTPATIENT_CLINIC_OR_DEPARTMENT_OTHER): Payer: MEDICARE | Admitting: Family Medicine

## 2023-04-14 DIAGNOSIS — C4442 Squamous cell carcinoma of skin of scalp and neck: Secondary | ICD-10-CM | POA: Insufficient documentation

## 2023-04-18 ENCOUNTER — Inpatient Hospital Stay: Payer: MEDICARE

## 2023-04-18 ENCOUNTER — Inpatient Hospital Stay: Payer: MEDICARE | Attending: Oncology

## 2023-04-18 ENCOUNTER — Inpatient Hospital Stay (HOSPITAL_BASED_OUTPATIENT_CLINIC_OR_DEPARTMENT_OTHER): Payer: MEDICARE | Admitting: Oncology

## 2023-04-18 VITALS — BP 140/79 | HR 87 | Temp 98.1°F | Resp 18 | Ht 71.0 in | Wt 195.9 lb

## 2023-04-18 VITALS — BP 119/70 | HR 78

## 2023-04-18 DIAGNOSIS — Z7901 Long term (current) use of anticoagulants: Secondary | ICD-10-CM

## 2023-04-18 DIAGNOSIS — Z85828 Personal history of other malignant neoplasm of skin: Secondary | ICD-10-CM | POA: Diagnosis not present

## 2023-04-18 DIAGNOSIS — Z79899 Other long term (current) drug therapy: Secondary | ICD-10-CM | POA: Insufficient documentation

## 2023-04-18 DIAGNOSIS — C4492 Squamous cell carcinoma of skin, unspecified: Secondary | ICD-10-CM | POA: Insufficient documentation

## 2023-04-18 DIAGNOSIS — C9110 Chronic lymphocytic leukemia of B-cell type not having achieved remission: Secondary | ICD-10-CM

## 2023-04-18 DIAGNOSIS — C9111 Chronic lymphocytic leukemia of B-cell type in remission: Secondary | ICD-10-CM | POA: Diagnosis present

## 2023-04-18 DIAGNOSIS — C4442 Squamous cell carcinoma of skin of scalp and neck: Secondary | ICD-10-CM | POA: Diagnosis present

## 2023-04-18 DIAGNOSIS — Z5111 Encounter for antineoplastic chemotherapy: Secondary | ICD-10-CM | POA: Insufficient documentation

## 2023-04-18 DIAGNOSIS — D696 Thrombocytopenia, unspecified: Secondary | ICD-10-CM | POA: Insufficient documentation

## 2023-04-18 DIAGNOSIS — N529 Male erectile dysfunction, unspecified: Secondary | ICD-10-CM | POA: Insufficient documentation

## 2023-04-18 LAB — CBC WITH DIFFERENTIAL (CANCER CENTER ONLY)
Abs Immature Granulocytes: 0.04 10*3/uL (ref 0.00–0.07)
Basophils Absolute: 0 10*3/uL (ref 0.0–0.1)
Basophils Relative: 0 %
Eosinophils Absolute: 0 10*3/uL (ref 0.0–0.5)
Eosinophils Relative: 0 %
HCT: 38.7 % — ABNORMAL LOW (ref 39.0–52.0)
Hemoglobin: 13 g/dL (ref 13.0–17.0)
Immature Granulocytes: 0 %
Lymphocytes Relative: 48 %
Lymphs Abs: 4.6 10*3/uL — ABNORMAL HIGH (ref 0.7–4.0)
MCH: 31.3 pg (ref 26.0–34.0)
MCHC: 33.6 g/dL (ref 30.0–36.0)
MCV: 93 fL (ref 80.0–100.0)
Monocytes Absolute: 0.9 10*3/uL (ref 0.1–1.0)
Monocytes Relative: 9 %
Neutro Abs: 4.1 10*3/uL (ref 1.7–7.7)
Neutrophils Relative %: 43 %
Platelet Count: 91 10*3/uL — ABNORMAL LOW (ref 150–400)
RBC: 4.16 MIL/uL — ABNORMAL LOW (ref 4.22–5.81)
RDW: 13.3 % (ref 11.5–15.5)
WBC Count: 9.5 10*3/uL (ref 4.0–10.5)
nRBC: 0 % (ref 0.0–0.2)

## 2023-04-18 LAB — CMP (CANCER CENTER ONLY)
ALT: 7 U/L (ref 0–44)
AST: 16 U/L (ref 15–41)
Albumin: 4 g/dL (ref 3.5–5.0)
Alkaline Phosphatase: 39 U/L (ref 38–126)
Anion gap: 6 (ref 5–15)
BUN: 24 mg/dL — ABNORMAL HIGH (ref 8–23)
CO2: 29 mmol/L (ref 22–32)
Calcium: 9.8 mg/dL (ref 8.9–10.3)
Chloride: 100 mmol/L (ref 98–111)
Creatinine: 1.46 mg/dL — ABNORMAL HIGH (ref 0.61–1.24)
GFR, Estimated: 49 mL/min — ABNORMAL LOW (ref >60)
Glucose, Bld: 100 mg/dL — ABNORMAL HIGH (ref 70–99)
Potassium: 3.9 mmol/L (ref 3.5–5.1)
Sodium: 135 mmol/L (ref 135–145)
Total Bilirubin: 0.7 mg/dL (ref 0.3–1.2)
Total Protein: 7.5 g/dL (ref 6.5–8.1)

## 2023-04-18 LAB — PROTIME-INR
INR: 2.1 — ABNORMAL HIGH (ref 0.8–1.2)
Prothrombin Time: 23.3 seconds — ABNORMAL HIGH (ref 11.4–15.2)

## 2023-04-18 MED ORDER — SODIUM CHLORIDE 0.9 % IV SOLN
350.0000 mg | Freq: Once | INTRAVENOUS | Status: AC
  Administered 2023-04-18: 350 mg via INTRAVENOUS
  Filled 2023-04-18: qty 7

## 2023-04-18 MED ORDER — SODIUM CHLORIDE 0.9 % IV SOLN: Freq: Once | INTRAVENOUS | Status: AC

## 2023-04-18 NOTE — Patient Instructions (Signed)
[place] [facility]   Discharge Instructions: Thank you for choosing [facility] to provide your oncology and hematology care.   If you have a lab appointment with the Cancer Center, please go directly to the Cancer Center and check in at the registration area.   Wear comfortable clothing and clothing appropriate for easy access to any Portacath or PICC line.   We strive to give you quality time with your provider. You may need to reschedule your appointment if you arrive late (15 or more minutes).  Arriving late affects you and other patients whose appointments are after yours.  Also, if you miss three or more appointments without notifying the office, you may be dismissed from the clinic at the provider's discretion.      For prescription refill requests, have your pharmacy contact our office and allow 72 hours for refills to be completed.    Today you received the following chemotherapy and/or immunotherapy agents Libtayo.      To help prevent nausea and vomiting after your treatment, we encourage you to take your nausea medication as directed.  BELOW ARE SYMPTOMS THAT SHOULD BE REPORTED IMMEDIATELY: *FEVER GREATER THAN 100.4 F (38 C) OR HIGHER *CHILLS OR SWEATING *NAUSEA AND VOMITING THAT IS NOT CONTROLLED WITH YOUR NAUSEA MEDICATION *UNUSUAL SHORTNESS OF BREATH *UNUSUAL BRUISING OR BLEEDING *URINARY PROBLEMS (pain or burning when urinating, or frequent urination) *BOWEL PROBLEMS (unusual diarrhea, constipation, pain near the anus) TENDERNESS IN MOUTH AND THROAT WITH OR WITHOUT PRESENCE OF ULCERS (sore throat, sores in mouth, or a toothache) UNUSUAL RASH, SWELLING OR PAIN  UNUSUAL VAGINAL DISCHARGE OR ITCHING   Items with * indicate a potential emergency and should be followed up as soon as possible or go to the Emergency Department if any problems should occur.  Please show the CHEMOTHERAPY ALERT CARD or IMMUNOTHERAPY ALERT CARD at  check-in to the Emergency Department and triage nurse.  Should you have questions after your visit or need to cancel or reschedule your appointment, please contact [facility]  Dept: [phone]  and follow the prompts.  Office hours are 8:00 a.m. to 4:30 p.m. Monday - Friday. Please note that voicemails left after 4:00 p.m. may not be returned until the following business day.  We are closed weekends and major holidays. You have access to a nurse at all times for urgent questions. Please call the main number to the clinic Dept: [phone] and follow the prompts.   For any non-urgent questions, you may also contact your provider using MyChart. We now offer e-Visits for anyone 65 and older to request care online for non-urgent symptoms. For details visit [email or web address].   Also download the MyChart app! Go to the app store, search "MyChart", open the app, select [facility], and log in with your MyChart username and password.  Cemiplimab Injection What is this medication? CEMIPLIMAB (se MIP li mab) treats skin cancer and lung cancer. It works by helping your immune system slow or stop the spread of cancer cells. It is a monoclonal antibody. This medicine may be used for other purposes; ask your health care provider or pharmacist if you have questions. COMMON BRAND NAME(S): LIBTAYO What should I tell my care team before I take this medication? They need to know if you have any of these conditions: Allogeneic stem cell transplant (uses someone else's stem cells) Autoimmune diseases, such as Crohn's disease, ulcerative colitis, or lupus Diabetes Nervous system problems, such  as myasthenia gravis or Guillain-Barre syndrome Organ transplant Recent or ongoing radiation Thyroid disease An unusual or allergic reaction to cemiplimab, other medications, foods, dyes, or preservatives Pregnant or trying to get pregnant Breast-feeding How should I use this  medication? This medication is infused into a vein. It is given by your care team in a hospital or clinic setting. A special MedGuide will be given to you before each treatment. Be sure to read this information carefully each time. Talk to your care team about the use of this medication in children. Special care may be needed. Overdosage: If you think you have taken too much of this medicine contact a poison control center or emergency room at once. NOTE: This medicine is only for you. Do not share this medicine with others. What if I miss a dose? Keep appointments for follow-up doses. It is important not to miss your dose. Call your care team if you are unable to keep an appointment. What may interact with this medication? Interactions have not been studied. This list may not describe all possible interactions. Give your health care provider a list of all the medicines, herbs, non-prescription drugs, or dietary supplements you use. Also tell them if you smoke, drink alcohol, or use illegal drugs. Some items may interact with your medicine. What should I watch for while using this medication? This medication may make you feel generally unwell. This is not uncommon as chemotherapy can affect healthy cells as well as cancer cells. Report any side effects. Continue your course of treatment even though you feel ill until your care team tells you to stop. You may need blood work done while you are taking this medication. This medication may cause serious skin reactions. They can happen in the weeks to months after starting the medication. Contact your care team right away if you notice fevers or flu-like symptoms with a rash. The rash may be red or purple and then turn into blisters or peeling of the skin. You may also notice a red rash with swelling of the face, lips, or lymph nodes in your neck or under your arms. Tell your care team right away if you have any changes in your vision. This medication may  increase blood sugar. The risk may be higher in patients who already have diabetes. Ask your care team what you can do to lower your risk of diabetes while taking this medication. Talk to your care team if you wish to become pregnant or think you might be pregnant. This medication can cause serious birth defects if taken during pregnancy. A negative pregnancy test is required before starting this medication. A reliable form of contraception is recommended while taking this medication and for at least 4 months after stopping it. Do not breast-feed while taking this medication and for at least 4 months after stopping it. What side effects may I notice from receiving this medication? Side effects that you should report to your care team as soon as possible: Allergic reactions--skin rash, itching, hives, swelling of the face, lips, tongue, or throat Dry cough, shortness of breath or trouble breathing Eye pain, redness, irritation, or discharge with blurry or decreased vision Heart muscle inflammation--unusual weakness or fatigue, shortness of breath, chest pain, fast or irregular heartbeat, dizziness, swelling of the ankles, feet, or hands Hormone gland problems--headache, sensitivity to light, unusual weakness or fatigue, dizziness, fast or irregular heartbeat, increased sensitivity to cold or heat, excessive sweating, constipation, hair loss, increased thirst or amount of urine, tremors  or shaking, irritability Infusion reactions--chest pain, shortness of breath or trouble breathing, feeling faint or lightheaded Kidney injury (glomerulonephritis)--decrease in the amount of urine, red or dark brown urine, foamy or bubbly urine, swelling of the ankles, hands, or feet Liver injury--right upper belly pain, loss of appetite, nausea, light-colored stool, dark yellow or brown urine, yellowing skin or eyes, unusual weakness or fatigue Pain, tingling, or numbness in the hands or feet, muscle weakness, change in  vision, confusion or trouble speaking, loss of balance or coordination, trouble walking, seizures Rash, fever, and swollen lymph nodes Redness, blistering, peeling, or loosening of the skin, including inside the mouth Sudden or severe stomach pain, bloody diarrhea, fever, nausea, vomiting Side effects that usually do not require medical attention (report these to your care team if they continue or are bothersome): Bone, joint, or muscle pain Diarrhea Fatigue Loss of appetite Nausea Skin rash This list may not describe all possible side effects. Call your doctor for medical advice about side effects. You may report side effects to FDA at 1-800-FDA-1088. Where should I keep my medication? This medication is given in a hospital or clinic and will not be stored at home. NOTE: This sheet is a summary. It may not cover all possible information. If you have questions about this medicine, talk to your doctor, pharmacist, or health care provider.  2023 Elsevier/Gold Standard (2021-10-16 00:00:00)

## 2023-04-18 NOTE — Progress Notes (Signed)
Patient seen by Dr. Truett Perna today  Vitals are within treatment parameters.  Labs reviewed by Dr. Truett Perna and are not all within treatment parameters. Platelets 91, per MD Sherrill, ok to treat.  Per physician team, patient is ready for treatment and there are NO modifications to the treatment plan.

## 2023-04-18 NOTE — Progress Notes (Signed)
[facility] OFFICE PROGRESS NOTE   Diagnosis: CLL, squamous cell skin cancer  INTERVAL HISTORY:   [name] completed another cycle of Cemiplimab on 03/28/2023.  He did not have a fever or confusion following this treatment.  No rash or diarrhea.  He continues to have a poor appetite. Dr. Roderic Scarce biopsied a right temple skin lesion on 04/12/2023.  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 He saw Dr. Tama Gander 04/13/2023.  He plans to contact [name] this week they decision on proceeding with surgery.  Objective:  Vital signs in last 24 hours:  Blood pressure (!) 140/79, pulse 87, temperature 98.1 F (36.7 C), resp. rate 18, height 5\' 11"  (1.803 m), weight 195 lb 14.4 oz (88.9 kg), SpO2 100 %.    HEENT: No thrush or ulcers Resp: Clear bilaterally Cardio: Regular rate and rhythm GI: Firm masslike tenderness in the upper abdomen on the left greater than midline with associated tenderness Vascular: Chronic stasis change at the lower leg bilaterally  Skin: New biopsy site at the right temporal face, improvement in nodular lesions over the forehead  Portacath/PICC-without erythema  Lab Results:  Lab Results  Component Value Date   WBC 9.5 04/18/2023   HGB 13.0 04/18/2023   HCT 38.7 (L) 04/18/2023   MCV 93.0 04/18/2023   PLT 91 (L) 04/18/2023   NEUTROABS 4.1 04/18/2023    CMP  Lab Results  Component Value Date   NA 135 04/18/2023   K 3.9 04/18/2023   CL 100 04/18/2023   CO2 29 04/18/2023   GLUCOSE 100 (H) 04/18/2023   BUN 24 (H) 04/18/2023   CREATININE 1.46 (H) 04/18/2023   CALCIUM 9.8 04/18/2023   PROT 7.5 04/18/2023   ALBUMIN 4.0 04/18/2023   AST 16 04/18/2023   ALT 7 04/18/2023   ALKPHOS 39 04/18/2023   BILITOT 0.7 04/18/2023   GFRNONAA 49 (L) 04/18/2023   GFRAA 52 (L) 08/13/2020    Medications: I have reviewed the patient's current medications.   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 He will return for a monthly PT check.  Indwelling IVC catheter.  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        4. 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 erectile dysfunction-followed by Dr. Annabell Howells , status post placement of a penile implant at [facility] in April 2014            7. CT of the abdomen/pelvis at [facility] urology February 2014 for evaluation of hematuria-12 mm external iliac nodes and left periaortic retroperitoneal lymph node-11 mm-potentially related to CLL      8.  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, peripheral lymphocytosis and small palpable lymph nodes          CT abdomen/pelvis 09/18/2019-extensive abdominal pelvic lymphadenopathy including a left periaortic node measuring 5.7 cm 08/13/2020 CLL FISH panel-no evidence of trisomy 12; no evidence of [id number]; no evidence of p53 deletion or amplification; deletion of ATM present 11/05/2020-peripheral blood TP53 mutation-negative Cycle 1 bendamustine/Rituxan 10/08/2020, 10/09/2020 Cycle 2 bendamustine/Rituxan 11/05/2020, 11/06/2020 Cycle 3 bendamustine/Rituxan 12/03/2020, 12/04/2020 Cycle 4 bendamustine/Rituxan 12/31/2020, 01/01/2021 CTs 01/19/2021-decrease in bulky abdomen/pelvic lymphadenopathy with more widespread ill-defined "haziness "in the mesentery and retroperitoneal fat, decreased pelvic lymphadenopathy Cycle 5 bendamustine/Rituxan 01/28/2021, 01/29/2021 CT 02/22/2023-mild generalized lymphadenopathy in the neck, significant increase in chest andabdominal/pelvic lymph nodes,  dominant nodal mass in the mesentery Cycle 6 bendamustine/rituximab 02/25/2021, 02/26/2021     9.  Basal cell carcinoma removed from the left superior central forehead 01/21/2020    10.  Renal insufficiency    11.  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  Indeterminate left hepatic lobe lesion on CT 02/22/2023 13.  Fever and altered mental status following cycle 2 Cemiplimab-no source for infection identified,       Disposition: [name] appears stable.  He has experienced clinical improvement while on Cemiplimab.  He will complete cycle 4 today.  He will follow-up with Dr. Tama Gander to decide on proceeding with resection of remaining tumor.  He has chronic lymphocytic leukemia with abdominal adenopathy.  We will need to consider treating the CLL if he develops increased symptoms from the abdominal adenopathy.  He is stable from a hematologic standpoint.  He will return for office visit and treatment in 3 weeks.  He will continue Coumadin at current dose.  The PT/INR is therapeutic.    [name], MD  04/18/2023  9:24 AM

## 2023-05-04 ENCOUNTER — Telehealth: Payer: Self-pay

## 2023-05-04 NOTE — Telephone Encounter (Signed)
A patient contacted to inquire about their upcoming infusion appointment, indicating they had received a reminder call for the appointment scheduled on 6/10. The patient expressed a desire for confirmation that Dr. Truett Perna had communicated with Dr. Tama Gander regarding their treatment plan moving forward. The patient also indicated they had not yet received any communication from Dr. Tama Gander.

## 2023-05-05 ENCOUNTER — Encounter: Payer: Self-pay | Admitting: Oncology

## 2023-05-06 ENCOUNTER — Telehealth: Payer: Self-pay

## 2023-05-06 NOTE — Telephone Encounter (Signed)
I have had a discussion with Dr. Jenene Slicker navigator regarding the patient's upcoming treatment plan. Gunnar Fusi informed me that Dr. Tama Gander intends to continue treatment for the next two months.

## 2023-05-09 ENCOUNTER — Inpatient Hospital Stay: Payer: MEDICARE

## 2023-05-09 ENCOUNTER — Inpatient Hospital Stay: Payer: MEDICARE | Admitting: Nurse Practitioner

## 2023-05-09 ENCOUNTER — Ambulatory Visit: Payer: MEDICARE

## 2023-05-09 ENCOUNTER — Other Ambulatory Visit: Payer: MEDICARE

## 2023-05-09 ENCOUNTER — Encounter: Payer: Self-pay | Admitting: *Deleted

## 2023-05-09 ENCOUNTER — Ambulatory Visit: Payer: MEDICARE | Admitting: Nurse Practitioner

## 2023-05-09 NOTE — Progress Notes (Signed)
Chad Gross had called last week to cancel his 05/09/23 OV and treatment and move to 6/17 (this was done) since he was not able to speak with dermatologist at [facility]. [facility] office returned call and confirmed OK to continue current treatment. On 05/06/23, patient requested to change back to 6/10, but Chad Gross was made aware that there were no open provider appointments now on 6/10 to see him prior to treatment. Keep currently scheduled appointment on 6/17. Dr. Truett Perna is aware.

## 2023-05-16 ENCOUNTER — Encounter: Payer: Self-pay | Admitting: Nurse Practitioner

## 2023-05-16 ENCOUNTER — Inpatient Hospital Stay: Payer: MEDICARE

## 2023-05-16 ENCOUNTER — Inpatient Hospital Stay: Payer: MEDICARE | Attending: Oncology

## 2023-05-16 ENCOUNTER — Inpatient Hospital Stay (HOSPITAL_BASED_OUTPATIENT_CLINIC_OR_DEPARTMENT_OTHER): Payer: MEDICARE | Admitting: Nurse Practitioner

## 2023-05-16 VITALS — BP 122/80 | HR 86 | Temp 98.1°F | Resp 18 | Ht 71.0 in | Wt 193.1 lb

## 2023-05-16 VITALS — BP 122/66 | HR 72

## 2023-05-16 DIAGNOSIS — Z7901 Long term (current) use of anticoagulants: Secondary | ICD-10-CM

## 2023-05-16 DIAGNOSIS — N529 Male erectile dysfunction, unspecified: Secondary | ICD-10-CM | POA: Insufficient documentation

## 2023-05-16 DIAGNOSIS — Z5111 Encounter for antineoplastic chemotherapy: Secondary | ICD-10-CM | POA: Insufficient documentation

## 2023-05-16 DIAGNOSIS — Z79899 Other long term (current) drug therapy: Secondary | ICD-10-CM | POA: Diagnosis not present

## 2023-05-16 DIAGNOSIS — C4442 Squamous cell carcinoma of skin of scalp and neck: Secondary | ICD-10-CM | POA: Diagnosis present

## 2023-05-16 DIAGNOSIS — D696 Thrombocytopenia, unspecified: Secondary | ICD-10-CM | POA: Insufficient documentation

## 2023-05-16 DIAGNOSIS — C9111 Chronic lymphocytic leukemia of B-cell type in remission: Secondary | ICD-10-CM | POA: Diagnosis present

## 2023-05-16 DIAGNOSIS — C9110 Chronic lymphocytic leukemia of B-cell type not having achieved remission: Secondary | ICD-10-CM

## 2023-05-16 DIAGNOSIS — Z85828 Personal history of other malignant neoplasm of skin: Secondary | ICD-10-CM | POA: Diagnosis not present

## 2023-05-16 LAB — CBC WITH DIFFERENTIAL (CANCER CENTER ONLY)
Abs Immature Granulocytes: 0.06 10*3/uL (ref 0.00–0.07)
Basophils Absolute: 0 10*3/uL (ref 0.0–0.1)
Basophils Relative: 0 %
Eosinophils Absolute: 0 10*3/uL (ref 0.0–0.5)
Eosinophils Relative: 0 %
HCT: 40.3 % (ref 39.0–52.0)
Hemoglobin: 13.7 g/dL (ref 13.0–17.0)
Immature Granulocytes: 1 %
Lymphocytes Relative: 56 %
Lymphs Abs: 6.3 10*3/uL — ABNORMAL HIGH (ref 0.7–4.0)
MCH: 31.1 pg (ref 26.0–34.0)
MCHC: 34 g/dL (ref 30.0–36.0)
MCV: 91.6 fL (ref 80.0–100.0)
Monocytes Absolute: 1 10*3/uL (ref 0.1–1.0)
Monocytes Relative: 9 %
Neutro Abs: 3.8 10*3/uL (ref 1.7–7.7)
Neutrophils Relative %: 34 %
Platelet Count: 90 10*3/uL — ABNORMAL LOW (ref 150–400)
RBC: 4.4 MIL/uL (ref 4.22–5.81)
RDW: 13.1 % (ref 11.5–15.5)
WBC Count: 11.2 10*3/uL — ABNORMAL HIGH (ref 4.0–10.5)
nRBC: 0.2 % (ref 0.0–0.2)

## 2023-05-16 LAB — CMP (CANCER CENTER ONLY)
ALT: 7 U/L (ref 0–44)
AST: 15 U/L (ref 15–41)
Albumin: 4.2 g/dL (ref 3.5–5.0)
Alkaline Phosphatase: 38 U/L (ref 38–126)
Anion gap: 7 (ref 5–15)
BUN: 21 mg/dL (ref 8–23)
CO2: 26 mmol/L (ref 22–32)
Calcium: 9.6 mg/dL (ref 8.9–10.3)
Chloride: 102 mmol/L (ref 98–111)
Creatinine: 1.41 mg/dL — ABNORMAL HIGH (ref 0.61–1.24)
GFR, Estimated: 51 mL/min — ABNORMAL LOW (ref >60)
Glucose, Bld: 119 mg/dL — ABNORMAL HIGH (ref 70–99)
Potassium: 4 mmol/L (ref 3.5–5.1)
Sodium: 135 mmol/L (ref 135–145)
Total Bilirubin: 0.5 mg/dL (ref 0.3–1.2)
Total Protein: 7.8 g/dL (ref 6.5–8.1)

## 2023-05-16 LAB — PROTIME-INR
INR: 2.1 — ABNORMAL HIGH (ref 0.8–1.2)
Prothrombin Time: 24.2 seconds — ABNORMAL HIGH (ref 11.4–15.2)

## 2023-05-16 MED ORDER — SODIUM CHLORIDE 0.9 % IV SOLN: Freq: Once | INTRAVENOUS | Status: AC

## 2023-05-16 MED ORDER — SODIUM CHLORIDE 0.9 % IV SOLN
350.0000 mg | Freq: Once | INTRAVENOUS | Status: AC
  Administered 2023-05-16: 350 mg via INTRAVENOUS
  Filled 2023-05-16: qty 7

## 2023-05-16 NOTE — Progress Notes (Signed)
[facility] OFFICE PROGRESS NOTE   Diagnosis: CLL, squamous cell skin cancer  INTERVAL HISTORY:   Mr. Artrip completed cycle 4 cemiplimab 04/18/2023.  He feels he is tolerating treatment well.  No nausea or vomiting.  No rash.  No diarrhea.  Appetite is "the same".  No fever.  Intermittent night sweats for the past few weeks.  Objective:  Vital signs in last 24 hours:  Blood pressure 122/80, pulse 86, temperature 98.1 F (36.7 C), temperature source Oral, resp. rate 18, height 5\' 11"  (1.803 m), weight 193 lb 1.6 oz (87.6 kg), SpO2 98 %.    HEENT: No thrush or ulcers. Lymphatics: No palpable cervical, supraclavicular or axillary lymph nodes. Resp: Lungs clear bilaterally. Cardio: Regular rate and rhythm. GI: Firm masslike fullness left upper abdomen. Vascular: No significant leg edema.  Chronic stasis changes lower leg bilaterally. Skin: Nodules scattered over the forehead.   Lab Results:  Lab Results  Component Value Date   WBC 11.2 (H) 05/16/2023   HGB 13.7 05/16/2023   HCT 40.3 05/16/2023   MCV 91.6 05/16/2023   PLT 90 (L) 05/16/2023   NEUTROABS 3.8 05/16/2023    Imaging:  No results found.  Medications: I have reviewed the patient's current medications.  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 He will return for a monthly PT check.  Indwelling IVC catheter.  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        4. 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 erectile dysfunction-followed by Dr. Annabell Howells , status post placement of a penile implant at [facility] in April 2014            7. CT of the abdomen/pelvis at [facility] urology February 2014 for evaluation of hematuria-12 mm external iliac nodes and left periaortic retroperitoneal lymph node-11 mm-potentially related to CLL      8.  CLL-flow cytometry of the peripheral  blood 12/27/2017 monoclonal B-cell population consistent with CLL, peripheral lymphocytosis and small palpable lymph nodes          CT abdomen/pelvis 09/18/2019-extensive abdominal pelvic lymphadenopathy including a left periaortic node measuring 5.7 cm 08/13/2020 CLL FISH panel-no evidence of trisomy 12; no evidence of [id number]; no evidence of p53 deletion or amplification; deletion of ATM present 11/05/2020-peripheral blood TP53 mutation-negative Cycle 1 bendamustine/Rituxan 10/08/2020, 10/09/2020 Cycle 2 bendamustine/Rituxan 11/05/2020, 11/06/2020 Cycle 3 bendamustine/Rituxan 12/03/2020, 12/04/2020 Cycle 4 bendamustine/Rituxan 12/31/2020, 01/01/2021 CTs 01/19/2021-decrease in bulky abdomen/pelvic lymphadenopathy with more widespread ill-defined "haziness "in the mesentery and retroperitoneal fat, decreased pelvic lymphadenopathy Cycle 5 bendamustine/Rituxan 01/28/2021, 01/29/2021 CT 02/22/2023-mild generalized lymphadenopathy in the neck, significant increase in chest andabdominal/pelvic lymph nodes, dominant nodal mass in the mesentery Cycle 6 bendamustine/rituximab 02/25/2021, 02/26/2021     9.  Basal cell carcinoma removed from the left superior central forehead 01/21/2020    10.  Renal insufficiency    11.  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  Indeterminate left hepatic lobe lesion on CT 02/22/2023 13.  Fever and altered mental status following cycle 2 Cemiplimab-no source for infection identified  Disposition: [name] appears stable.  He has completed 4 cycles of cemiplimab.  Plan to proceed  with cycle 5 today as scheduled.  Overall plan is to complete 6 cycles prior to reevaluation with Dr. Tama Gander at [facility].  CBC and chemistry panel reviewed.  Labs adequate to proceed as above.  He remains stable from a hematologic standpoint.  PT/INR in therapeutic range.  He will continue Coumadin at the current dose.  He will return for lab, follow-up, cemiplimab in 3 weeks.  We are available to see him sooner if needed.    Lonna Cobb ANP/GNP-BC   05/16/2023  12:05 PM

## 2023-05-16 NOTE — Patient Instructions (Signed)
[facility]   Discharge Instructions: Thank you for choosing [facility] to provide your oncology and hematology care.   If you have a lab appointment with the Cancer Center, please go directly to the Cancer Center and check in at the registration area.   Wear comfortable clothing and clothing appropriate for easy access to any Portacath or PICC line.   We strive to give you quality time with your provider. You may need to reschedule your appointment if you arrive late (15 or more minutes).  Arriving late affects you and other patients whose appointments are after yours.  Also, if you miss three or more appointments without notifying the office, you may be dismissed from the clinic at the provider's discretion.      For prescription refill requests, have your pharmacy contact our office and allow 72 hours for refills to be completed.    Today you received the following chemotherapy and/or immunotherapy agents Libtayo.      To help prevent nausea and vomiting after your treatment, we encourage you to take your nausea medication as directed.  BELOW ARE SYMPTOMS THAT SHOULD BE REPORTED IMMEDIATELY: *FEVER GREATER THAN 100.4 F (38 C) OR HIGHER *CHILLS OR SWEATING *NAUSEA AND VOMITING THAT IS NOT CONTROLLED WITH YOUR NAUSEA MEDICATION *UNUSUAL SHORTNESS OF BREATH *UNUSUAL BRUISING OR BLEEDING *URINARY PROBLEMS (pain or burning when urinating, or frequent urination) *BOWEL PROBLEMS (unusual diarrhea, constipation, pain near the anus) TENDERNESS IN MOUTH AND THROAT WITH OR WITHOUT PRESENCE OF ULCERS (sore throat, sores in mouth, or a toothache) UNUSUAL RASH, SWELLING OR PAIN  UNUSUAL VAGINAL DISCHARGE OR ITCHING   Items with * indicate a potential emergency and should be followed up as soon as possible or go to the Emergency Department if any problems should occur.  Please show the CHEMOTHERAPY ALERT CARD or IMMUNOTHERAPY ALERT CARD at  check-in to the Emergency Department and triage nurse.  Should you have questions after your visit or need to cancel or reschedule your appointment, please contact [facility]  Dept: [phone]  and follow the prompts.  Office hours are 8:00 a.m. to 4:30 p.m. Monday - Friday. Please note that voicemails left after 4:00 p.m. may not be returned until the following business day.  We are closed weekends and major holidays. You have access to a nurse at all times for urgent questions. Please call the main number to the clinic Dept: [phone] and follow the prompts.   For any non-urgent questions, you may also contact your provider using MyChart. We now offer e-Visits for anyone 18 and older to request care online for non-urgent symptoms. For details visit [email or web address].   Also download the MyChart app! Go to the app store, search "MyChart", open the app, select [place], and log in with your MyChart username and password.  Cemiplimab Injection What is this medication? CEMIPLIMAB (se MIP li mab) treats skin cancer and lung cancer. It works by helping your immune system slow or stop the spread of cancer cells. It is a monoclonal antibody. This medicine may be used for other purposes; ask your health care provider or pharmacist if you have questions. COMMON BRAND NAME(S): LIBTAYO What should I tell my care team before I take this medication? They need to know if you have any of these conditions: Allogeneic stem cell transplant (uses someone else's stem cells) Autoimmune diseases, such as Crohn's disease, ulcerative colitis, or lupus Diabetes Nervous system problems, such   as myasthenia gravis or Guillain-Barre syndrome Organ transplant Recent or ongoing radiation Thyroid disease An unusual or allergic reaction to cemiplimab, other medications, foods, dyes, or preservatives Pregnant or trying to get pregnant Breast-feeding How should I use this  medication? This medication is infused into a vein. It is given by your care team in a hospital or clinic setting. A special MedGuide will be given to you before each treatment. Be sure to read this information carefully each time. Talk to your care team about the use of this medication in children. Special care may be needed. Overdosage: If you think you have taken too much of this medicine contact a poison control center or emergency room at once. NOTE: This medicine is only for you. Do not share this medicine with others. What if I miss a dose? Keep appointments for follow-up doses. It is important not to miss your dose. Call your care team if you are unable to keep an appointment. What may interact with this medication? Interactions have not been studied. This list may not describe all possible interactions. Give your health care provider a list of all the medicines, herbs, non-prescription drugs, or dietary supplements you use. Also tell them if you smoke, drink alcohol, or use illegal drugs. Some items may interact with your medicine. What should I watch for while using this medication? This medication may make you feel generally unwell. This is not uncommon as chemotherapy can affect healthy cells as well as cancer cells. Report any side effects. Continue your course of treatment even though you feel ill until your care team tells you to stop. You may need blood work done while you are taking this medication. This medication may cause serious skin reactions. They can happen in the weeks to months after starting the medication. Contact your care team right away if you notice fevers or flu-like symptoms with a rash. The rash may be red or purple and then turn into blisters or peeling of the skin. You may also notice a red rash with swelling of the face, lips, or lymph nodes in your neck or under your arms. Tell your care team right away if you have any changes in your vision. This medication may  increase blood sugar. The risk may be higher in patients who already have diabetes. Ask your care team what you can do to lower your risk of diabetes while taking this medication. Talk to your care team if you wish to become pregnant or think you might be pregnant. This medication can cause serious birth defects if taken during pregnancy. A negative pregnancy test is required before starting this medication. A reliable form of contraception is recommended while taking this medication and for at least 4 months after stopping it. Do not breast-feed while taking this medication and for at least 4 months after stopping it. What side effects may I notice from receiving this medication? Side effects that you should report to your care team as soon as possible: Allergic reactions--skin rash, itching, hives, swelling of the face, lips, tongue, or throat Dry cough, shortness of breath or trouble breathing Eye pain, redness, irritation, or discharge with blurry or decreased vision Heart muscle inflammation--unusual weakness or fatigue, shortness of breath, chest pain, fast or irregular heartbeat, dizziness, swelling of the ankles, feet, or hands Hormone gland problems--headache, sensitivity to light, unusual weakness or fatigue, dizziness, fast or irregular heartbeat, increased sensitivity to cold or heat, excessive sweating, constipation, hair loss, increased thirst or amount of urine, tremors   or shaking, irritability Infusion reactions--chest pain, shortness of breath or trouble breathing, feeling faint or lightheaded Kidney injury (glomerulonephritis)--decrease in the amount of urine, red or dark brown urine, foamy or bubbly urine, swelling of the ankles, hands, or feet Liver injury--right upper belly pain, loss of appetite, nausea, light-colored stool, dark yellow or brown urine, yellowing skin or eyes, unusual weakness or fatigue Pain, tingling, or numbness in the hands or feet, muscle weakness, change in  vision, confusion or trouble speaking, loss of balance or coordination, trouble walking, seizures Rash, fever, and swollen lymph nodes Redness, blistering, peeling, or loosening of the skin, including inside the mouth Sudden or severe stomach pain, bloody diarrhea, fever, nausea, vomiting Side effects that usually do not require medical attention (report these to your care team if they continue or are bothersome): Bone, joint, or muscle pain Diarrhea Fatigue Loss of appetite Nausea Skin rash This list may not describe all possible side effects. Call your doctor for medical advice about side effects. You may report side effects to FDA at 1-800-FDA-1088. Where should I keep my medication? This medication is given in a hospital or clinic and will not be stored at home. NOTE: This sheet is a summary. It may not cover all possible information. If you have questions about this medicine, talk to your doctor, pharmacist, or health care provider.  2023 Elsevier/Gold Standard (2021-10-16 00:00:00)  

## 2023-05-16 NOTE — Progress Notes (Signed)
Patient seen by Lonna Cobb NP today  Vitals are within treatment parameters.  Labs reviewed by Lonna Cobb NP and are not all within treatment parameters. PLTs 90  its ok to proceed   Per physician team, patient is ready for treatment and there are NO modifications to the treatment plan.

## 2023-05-19 ENCOUNTER — Other Ambulatory Visit: Payer: Self-pay | Admitting: Internal Medicine

## 2023-05-25 ENCOUNTER — Encounter (HOSPITAL_BASED_OUTPATIENT_CLINIC_OR_DEPARTMENT_OTHER): Payer: Self-pay | Admitting: Family Medicine

## 2023-05-25 ENCOUNTER — Other Ambulatory Visit (HOSPITAL_BASED_OUTPATIENT_CLINIC_OR_DEPARTMENT_OTHER): Payer: Self-pay

## 2023-05-25 MED ORDER — HYDROCORTISONE (PERIANAL) 2.5 % EX CREA: TOPICAL_CREAM | Freq: Two times a day (BID) | CUTANEOUS | 0 refills | Status: DC

## 2023-05-30 ENCOUNTER — Other Ambulatory Visit (HOSPITAL_BASED_OUTPATIENT_CLINIC_OR_DEPARTMENT_OTHER): Payer: Self-pay | Admitting: Family Medicine

## 2023-05-30 NOTE — Telephone Encounter (Signed)
Please advise on refill request

## 2023-06-01 MED ORDER — TRAMADOL HCL 50 MG PO TABS: 50.0000 mg | ORAL_TABLET | Freq: Four times a day (QID) | ORAL | 0 refills | Status: AC | PRN

## 2023-06-06 ENCOUNTER — Inpatient Hospital Stay: Payer: MEDICARE

## 2023-06-06 ENCOUNTER — Encounter: Payer: Self-pay | Admitting: Nurse Practitioner

## 2023-06-06 ENCOUNTER — Inpatient Hospital Stay (HOSPITAL_BASED_OUTPATIENT_CLINIC_OR_DEPARTMENT_OTHER): Payer: MEDICARE | Admitting: Nurse Practitioner

## 2023-06-06 ENCOUNTER — Inpatient Hospital Stay: Payer: MEDICARE | Attending: Oncology

## 2023-06-06 VITALS — BP 105/62 | HR 74 | Temp 98.6°F | Resp 18

## 2023-06-06 VITALS — BP 142/90 | HR 84 | Temp 98.1°F | Resp 18 | Ht 71.0 in | Wt 193.7 lb

## 2023-06-06 DIAGNOSIS — C9110 Chronic lymphocytic leukemia of B-cell type not having achieved remission: Secondary | ICD-10-CM

## 2023-06-06 DIAGNOSIS — N529 Male erectile dysfunction, unspecified: Secondary | ICD-10-CM | POA: Insufficient documentation

## 2023-06-06 DIAGNOSIS — Z79899 Other long term (current) drug therapy: Secondary | ICD-10-CM | POA: Diagnosis not present

## 2023-06-06 DIAGNOSIS — Z5111 Encounter for antineoplastic chemotherapy: Secondary | ICD-10-CM | POA: Insufficient documentation

## 2023-06-06 DIAGNOSIS — C4442 Squamous cell carcinoma of skin of scalp and neck: Secondary | ICD-10-CM | POA: Diagnosis present

## 2023-06-06 DIAGNOSIS — D696 Thrombocytopenia, unspecified: Secondary | ICD-10-CM | POA: Diagnosis not present

## 2023-06-06 DIAGNOSIS — Z85828 Personal history of other malignant neoplasm of skin: Secondary | ICD-10-CM | POA: Insufficient documentation

## 2023-06-06 LAB — CBC WITH DIFFERENTIAL (CANCER CENTER ONLY)
Abs Immature Granulocytes: 0.08 10*3/uL — ABNORMAL HIGH (ref 0.00–0.07)
Basophils Absolute: 0 10*3/uL (ref 0.0–0.1)
Basophils Relative: 0 %
Eosinophils Absolute: 0 10*3/uL (ref 0.0–0.5)
Eosinophils Relative: 0 %
HCT: 39.2 % (ref 39.0–52.0)
Hemoglobin: 13.6 g/dL (ref 13.0–17.0)
Immature Granulocytes: 1 %
Lymphocytes Relative: 64 %
Lymphs Abs: 8.2 10*3/uL — ABNORMAL HIGH (ref 0.7–4.0)
MCH: 31.6 pg (ref 26.0–34.0)
MCHC: 34.7 g/dL (ref 30.0–36.0)
MCV: 91.2 fL (ref 80.0–100.0)
Monocytes Absolute: 0.6 10*3/uL (ref 0.1–1.0)
Monocytes Relative: 5 %
Neutro Abs: 3.7 10*3/uL (ref 1.7–7.7)
Neutrophils Relative %: 30 %
Platelet Count: 81 10*3/uL — ABNORMAL LOW (ref 150–400)
RBC: 4.3 MIL/uL (ref 4.22–5.81)
RDW: 13.2 % (ref 11.5–15.5)
WBC Count: 12.7 10*3/uL — ABNORMAL HIGH (ref 4.0–10.5)
nRBC: 0 % (ref 0.0–0.2)

## 2023-06-06 LAB — TSH: TSH: 5.827 u[IU]/mL — ABNORMAL HIGH (ref 0.350–4.500)

## 2023-06-06 LAB — CMP (CANCER CENTER ONLY)
ALT: 8 U/L (ref 0–44)
AST: 17 U/L (ref 15–41)
Albumin: 4.4 g/dL (ref 3.5–5.0)
Alkaline Phosphatase: 38 U/L (ref 38–126)
Anion gap: 10 (ref 5–15)
BUN: 32 mg/dL — ABNORMAL HIGH (ref 8–23)
CO2: 24 mmol/L (ref 22–32)
Calcium: 9.8 mg/dL (ref 8.9–10.3)
Chloride: 102 mmol/L (ref 98–111)
Creatinine: 1.44 mg/dL — ABNORMAL HIGH (ref 0.61–1.24)
GFR, Estimated: 50 mL/min — ABNORMAL LOW (ref >60)
Glucose, Bld: 91 mg/dL (ref 70–99)
Potassium: 4 mmol/L (ref 3.5–5.1)
Sodium: 136 mmol/L (ref 135–145)
Total Bilirubin: 0.8 mg/dL (ref 0.3–1.2)
Total Protein: 7.8 g/dL (ref 6.5–8.1)

## 2023-06-06 LAB — PROTIME-INR
INR: 2.2 — ABNORMAL HIGH (ref 0.8–1.2)
Prothrombin Time: 24.4 seconds — ABNORMAL HIGH (ref 11.4–15.2)

## 2023-06-06 MED ORDER — SODIUM CHLORIDE 0.9 % IV SOLN: Freq: Once | INTRAVENOUS | Status: AC

## 2023-06-06 MED ORDER — SODIUM CHLORIDE 0.9 % IV SOLN
350.0000 mg | Freq: Once | INTRAVENOUS | Status: AC
  Administered 2023-06-06: 350 mg via INTRAVENOUS
  Filled 2023-06-06: qty 7

## 2023-06-06 NOTE — Patient Instructions (Signed)
[facility]   Discharge Instructions: Thank you for choosing [facility] to provide your oncology and hematology care.   If you have a lab appointment with the Cancer Center, please go directly to the Cancer Center and check in at the registration area.   Wear comfortable clothing and clothing appropriate for easy access to any Portacath or PICC line.   We strive to give you quality time with your provider. You may need to reschedule your appointment if you arrive late (15 or more minutes).  Arriving late affects you and other patients whose appointments are after yours.  Also, if you miss three or more appointments without notifying the office, you may be dismissed from the clinic at the provider's discretion.      For prescription refill requests, have your pharmacy contact our office and allow 72 hours for refills to be completed.    Today you received the following chemotherapy and/or immunotherapy agents Libtayo.      To help prevent nausea and vomiting after your treatment, we encourage you to take your nausea medication as directed.  BELOW ARE SYMPTOMS THAT SHOULD BE REPORTED IMMEDIATELY: *FEVER GREATER THAN 100.4 F (38 C) OR HIGHER *CHILLS OR SWEATING *NAUSEA AND VOMITING THAT IS NOT CONTROLLED WITH YOUR NAUSEA MEDICATION *UNUSUAL SHORTNESS OF BREATH *UNUSUAL BRUISING OR BLEEDING *URINARY PROBLEMS (pain or burning when urinating, or frequent urination) *BOWEL PROBLEMS (unusual diarrhea, constipation, pain near the anus) TENDERNESS IN MOUTH AND THROAT WITH OR WITHOUT PRESENCE OF ULCERS (sore throat, sores in mouth, or a toothache) UNUSUAL RASH, SWELLING OR PAIN  UNUSUAL VAGINAL DISCHARGE OR ITCHING   Items with * indicate a potential emergency and should be followed up as soon as possible or go to the Emergency Department if any problems should occur.  Please show the CHEMOTHERAPY ALERT CARD or IMMUNOTHERAPY ALERT CARD at  check-in to the Emergency Department and triage nurse.  Should you have questions after your visit or need to cancel or reschedule your appointment, please contact [facility]  Dept: [phone]  and follow the prompts.  Office hours are 8:00 a.m. to 4:30 p.m. Monday - Friday. Please note that voicemails left after 4:00 p.m. may not be returned until the following business day.  We are closed weekends and major holidays. You have access to a nurse at all times for urgent questions. Please call the main number to the clinic Dept: [phone] and follow the prompts.   For any non-urgent questions, you may also contact your provider using MyChart. We now offer e-Visits for anyone 18 and older to request care online for non-urgent symptoms. For details visit [email or web address].   Also download the MyChart app! Go to the app store, search "MyChart", open the app, select [place], and log in with your MyChart username and password.  Cemiplimab Injection What is this medication? CEMIPLIMAB (se MIP li mab) treats skin cancer and lung cancer. It works by helping your immune system slow or stop the spread of cancer cells. It is a monoclonal antibody. This medicine may be used for other purposes; ask your health care provider or pharmacist if you have questions. COMMON BRAND NAME(S): LIBTAYO What should I tell my care team before I take this medication? They need to know if you have any of these conditions: Allogeneic stem cell transplant (uses someone else's stem cells) Autoimmune diseases, such as Crohn's disease, ulcerative colitis, or lupus Diabetes Nervous system problems, such   as myasthenia gravis or Guillain-Barre syndrome Organ transplant Recent or ongoing radiation Thyroid disease An unusual or allergic reaction to cemiplimab, other medications, foods, dyes, or preservatives Pregnant or trying to get pregnant Breast-feeding How should I use this  medication? This medication is infused into a vein. It is given by your care team in a hospital or clinic setting. A special MedGuide will be given to you before each treatment. Be sure to read this information carefully each time. Talk to your care team about the use of this medication in children. Special care may be needed. Overdosage: If you think you have taken too much of this medicine contact a poison control center or emergency room at once. NOTE: This medicine is only for you. Do not share this medicine with others. What if I miss a dose? Keep appointments for follow-up doses. It is important not to miss your dose. Call your care team if you are unable to keep an appointment. What may interact with this medication? Interactions have not been studied. This list may not describe all possible interactions. Give your health care provider a list of all the medicines, herbs, non-prescription drugs, or dietary supplements you use. Also tell them if you smoke, drink alcohol, or use illegal drugs. Some items may interact with your medicine. What should I watch for while using this medication? This medication may make you feel generally unwell. This is not uncommon as chemotherapy can affect healthy cells as well as cancer cells. Report any side effects. Continue your course of treatment even though you feel ill until your care team tells you to stop. You may need blood work done while you are taking this medication. This medication may cause serious skin reactions. They can happen in the weeks to months after starting the medication. Contact your care team right away if you notice fevers or flu-like symptoms with a rash. The rash may be red or purple and then turn into blisters or peeling of the skin. You may also notice a red rash with swelling of the face, lips, or lymph nodes in your neck or under your arms. Tell your care team right away if you have any changes in your vision. This medication may  increase blood sugar. The risk may be higher in patients who already have diabetes. Ask your care team what you can do to lower your risk of diabetes while taking this medication. Talk to your care team if you wish to become pregnant or think you might be pregnant. This medication can cause serious birth defects if taken during pregnancy. A negative pregnancy test is required before starting this medication. A reliable form of contraception is recommended while taking this medication and for at least 4 months after stopping it. Do not breast-feed while taking this medication and for at least 4 months after stopping it. What side effects may I notice from receiving this medication? Side effects that you should report to your care team as soon as possible: Allergic reactions--skin rash, itching, hives, swelling of the face, lips, tongue, or throat Dry cough, shortness of breath or trouble breathing Eye pain, redness, irritation, or discharge with blurry or decreased vision Heart muscle inflammation--unusual weakness or fatigue, shortness of breath, chest pain, fast or irregular heartbeat, dizziness, swelling of the ankles, feet, or hands Hormone gland problems--headache, sensitivity to light, unusual weakness or fatigue, dizziness, fast or irregular heartbeat, increased sensitivity to cold or heat, excessive sweating, constipation, hair loss, increased thirst or amount of urine, tremors   or shaking, irritability Infusion reactions--chest pain, shortness of breath or trouble breathing, feeling faint or lightheaded Kidney injury (glomerulonephritis)--decrease in the amount of urine, red or dark brown urine, foamy or bubbly urine, swelling of the ankles, hands, or feet Liver injury--right upper belly pain, loss of appetite, nausea, light-colored stool, dark yellow or brown urine, yellowing skin or eyes, unusual weakness or fatigue Pain, tingling, or numbness in the hands or feet, muscle weakness, change in  vision, confusion or trouble speaking, loss of balance or coordination, trouble walking, seizures Rash, fever, and swollen lymph nodes Redness, blistering, peeling, or loosening of the skin, including inside the mouth Sudden or severe stomach pain, bloody diarrhea, fever, nausea, vomiting Side effects that usually do not require medical attention (report these to your care team if they continue or are bothersome): Bone, joint, or muscle pain Diarrhea Fatigue Loss of appetite Nausea Skin rash This list may not describe all possible side effects. Call your doctor for medical advice about side effects. You may report side effects to FDA at 1-800-FDA-1088. Where should I keep my medication? This medication is given in a hospital or clinic and will not be stored at home. NOTE: This sheet is a summary. It may not cover all possible information. If you have questions about this medicine, talk to your doctor, pharmacist, or health care provider.  2023 Elsevier/Gold Standard (2021-10-16 00:00:00)  

## 2023-06-06 NOTE — Progress Notes (Signed)
[facility] OFFICE PROGRESS NOTE   Diagnosis: CLL, squamous cell skin cancer  INTERVAL HISTORY:   Chad Gross returns as scheduled.  He completed cycle 5 Cemiplimab 05/16/2023.  He notes increased fatigue for a few days after each treatment.  No diarrhea.  No skin rash except "poison ivy".  Night sweats have increased.  No fever.  Objective:  Vital signs in last 24 hours:  Blood pressure (!) 142/90, pulse 84, temperature 98.1 F (36.7 C), temperature source Oral, resp. rate 18, height 5\' 11"  (1.803 m), weight 193 lb 11.2 oz (87.9 kg), SpO2 100 %.    HEENT: No thrush or ulcers. Lymphatics: Small left submandibular node.   Resp: Lungs clear bilaterally. Cardio: Regular rate and rhythm. GI: Firm masslike fullness left mid to upper abdomen, crosses midline. Vascular: No leg edema. Neuro: Alert and oriented. Skin: Nodular lesions over the forehead appear improved.   Lab Results:  Lab Results  Component Value Date   WBC 12.7 (H) 06/06/2023   HGB 13.6 06/06/2023   HCT 39.2 06/06/2023   MCV 91.2 06/06/2023   PLT 81 (L) 06/06/2023   NEUTROABS PENDING 06/06/2023     Medications: I have reviewed the patient's current medications.  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 He will return for a monthly PT check.  Indwelling IVC catheter.  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        4. 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 erectile dysfunction-followed by Dr. Annabell Howells , status post placement of a penile implant at [facility] in April 2014            7. CT of the abdomen/pelvis at [facility] urology February 2014 for evaluation of hematuria-12 mm external iliac nodes and left periaortic retroperitoneal lymph node-11 mm-potentially related to CLL      8.  CLL-flow cytometry of the peripheral blood 12/27/2017 monoclonal B-cell population  consistent with CLL, peripheral lymphocytosis and small palpable lymph nodes          CT abdomen/pelvis 09/18/2019-extensive abdominal pelvic lymphadenopathy including a left periaortic node measuring 5.7 cm 08/13/2020 CLL FISH panel-no evidence of trisomy 12; no evidence of [id number]; no evidence of p53 deletion or amplification; deletion of ATM present 11/05/2020-peripheral blood TP53 mutation-negative Cycle 1 bendamustine/Rituxan 10/08/2020, 10/09/2020 Cycle 2 bendamustine/Rituxan 11/05/2020, 11/06/2020 Cycle 3 bendamustine/Rituxan 12/03/2020, 12/04/2020 Cycle 4 bendamustine/Rituxan 12/31/2020, 01/01/2021 CTs 01/19/2021-decrease in bulky abdomen/pelvic lymphadenopathy with more widespread ill-defined "haziness "in the mesentery and retroperitoneal fat, decreased pelvic lymphadenopathy Cycle 5 bendamustine/Rituxan 01/28/2021, 01/29/2021 CT 02/22/2023-mild generalized lymphadenopathy in the neck, significant increase in chest andabdominal/pelvic lymph nodes, dominant nodal mass in the mesentery Cycle 6 bendamustine/rituximab 02/25/2021, 02/26/2021     9.  Basal cell carcinoma removed from the left superior central forehead 01/21/2020    10.  Renal insufficiency    11.  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  Indeterminate left hepatic lobe lesion on CT 02/22/2023 13.  Fever and altered mental status following cycle 2 Cemiplimab-no source for infection identified  Disposition: Chad Gross appears stable.  He has completed 5 cycles of cemiplimab.  Plan to proceed with cycle 6 today as scheduled.  He has  follow-up with Dr. Tama Gander at [facility] on 07/06/2023.  He will undergo restaging CTs for follow-up of CLL in the next few weeks.  He will return for lab and an office visit 07/01/2023.    Patient seen with Dr. Truett Perna.  Lonna Cobb ANP/GNP-BC   06/06/2023  10:09 AM This was a shared visit with Lonna Cobb.  Chad Gross was interviewed and examined.  He continues to tolerate the Cemiplimab well.  The skin lesions over the forehead appear improved.  The abdominal mass appears larger.  He will be referred for restaging CTs.  We will consider initiating salvage treatment for CLL based on the CT findings.  He indicated he would like to pursue repeat treatment with Bendamustine/rituximab as opposed to treatment with a BTK inhibitor.  I was present for greater than 50% of today's visit.  I performed medical decision making.  Mancel Bale, MD

## 2023-06-06 NOTE — Progress Notes (Signed)
Patient seen by Lonna Cobb NP today  Vitals are within treatment parameters.  Labs reviewed by Lonna Cobb NP and are not all within treatment parameters. PLTS 81 Per lisa it's okay to treat  Per physician team, patient is ready for treatment and there are NO modifications to the treatment plan.

## 2023-06-07 LAB — T4: T4, Total: 8.3 ug/dL (ref 4.5–12.0)

## 2023-06-25 ENCOUNTER — Encounter: Payer: Self-pay | Admitting: Oncology

## 2023-06-25 ENCOUNTER — Ambulatory Visit (HOSPITAL_BASED_OUTPATIENT_CLINIC_OR_DEPARTMENT_OTHER)
Admission: RE | Admit: 2023-06-25 | Discharge: 2023-06-25 | Disposition: A | Discharge status: Home / Self Care | Payer: MEDICARE | Source: Ambulatory Visit | Attending: Nurse Practitioner | Admitting: Nurse Practitioner

## 2023-06-25 DIAGNOSIS — C9110 Chronic lymphocytic leukemia of B-cell type not having achieved remission: Secondary | ICD-10-CM | POA: Insufficient documentation

## 2023-07-01 ENCOUNTER — Other Ambulatory Visit: Payer: Self-pay | Admitting: *Deleted

## 2023-07-01 ENCOUNTER — Inpatient Hospital Stay: Payer: MEDICARE | Attending: Oncology | Admitting: Oncology

## 2023-07-01 ENCOUNTER — Inpatient Hospital Stay: Payer: MEDICARE

## 2023-07-01 ENCOUNTER — Encounter: Payer: Self-pay | Admitting: Oncology

## 2023-07-01 ENCOUNTER — Telehealth: Payer: Self-pay | Admitting: *Deleted

## 2023-07-01 VITALS — BP 121/74 | HR 82 | Temp 98.1°F | Resp 18 | Ht 71.0 in | Wt 191.8 lb

## 2023-07-01 DIAGNOSIS — C9110 Chronic lymphocytic leukemia of B-cell type not having achieved remission: Secondary | ICD-10-CM | POA: Insufficient documentation

## 2023-07-01 DIAGNOSIS — Z8616 Personal history of COVID-19: Secondary | ICD-10-CM | POA: Insufficient documentation

## 2023-07-01 DIAGNOSIS — Z5111 Encounter for antineoplastic chemotherapy: Secondary | ICD-10-CM | POA: Diagnosis present

## 2023-07-01 DIAGNOSIS — C4442 Squamous cell carcinoma of skin of scalp and neck: Secondary | ICD-10-CM | POA: Diagnosis not present

## 2023-07-01 DIAGNOSIS — N529 Male erectile dysfunction, unspecified: Secondary | ICD-10-CM | POA: Insufficient documentation

## 2023-07-01 DIAGNOSIS — Z79899 Other long term (current) drug therapy: Secondary | ICD-10-CM | POA: Diagnosis not present

## 2023-07-01 DIAGNOSIS — D696 Thrombocytopenia, unspecified: Secondary | ICD-10-CM | POA: Insufficient documentation

## 2023-07-01 LAB — PROTIME-INR
INR: 2.1 — ABNORMAL HIGH (ref 0.8–1.2)
Prothrombin Time: 23.7 seconds — ABNORMAL HIGH (ref 11.4–15.2)

## 2023-07-01 LAB — CBC WITH DIFFERENTIAL (CANCER CENTER ONLY)
Abs Immature Granulocytes: 0.06 10*3/uL (ref 0.00–0.07)
Basophils Absolute: 0 10*3/uL (ref 0.0–0.1)
Basophils Relative: 0 %
Eosinophils Absolute: 0 10*3/uL (ref 0.0–0.5)
Eosinophils Relative: 0 %
HCT: 40 % (ref 39.0–52.0)
Hemoglobin: 13.5 g/dL (ref 13.0–17.0)
Immature Granulocytes: 0 %
Lymphocytes Relative: 70 %
Lymphs Abs: 9.4 10*3/uL — ABNORMAL HIGH (ref 0.7–4.0)
MCH: 31.4 pg (ref 26.0–34.0)
MCHC: 33.8 g/dL (ref 30.0–36.0)
MCV: 93 fL (ref 80.0–100.0)
Monocytes Absolute: 1 10*3/uL (ref 0.1–1.0)
Monocytes Relative: 7 %
Neutro Abs: 3.2 10*3/uL (ref 1.7–7.7)
Neutrophils Relative %: 23 %
Platelet Count: 71 10*3/uL — ABNORMAL LOW (ref 150–400)
RBC: 4.3 MIL/uL (ref 4.22–5.81)
RDW: 13.6 % (ref 11.5–15.5)
WBC Count: 13.6 10*3/uL — ABNORMAL HIGH (ref 4.0–10.5)
nRBC: 0.2 % (ref 0.0–0.2)

## 2023-07-01 LAB — CMP (CANCER CENTER ONLY)
ALT: 8 U/L (ref 0–44)
AST: 16 U/L (ref 15–41)
Albumin: 4.2 g/dL (ref 3.5–5.0)
Alkaline Phosphatase: 39 U/L (ref 38–126)
Anion gap: 8 (ref 5–15)
BUN: 27 mg/dL — ABNORMAL HIGH (ref 8–23)
CO2: 27 mmol/L (ref 22–32)
Calcium: 10.1 mg/dL (ref 8.9–10.3)
Chloride: 103 mmol/L (ref 98–111)
Creatinine: 1.61 mg/dL — ABNORMAL HIGH (ref 0.61–1.24)
GFR, Estimated: 44 mL/min — ABNORMAL LOW (ref >60)
Glucose, Bld: 113 mg/dL — ABNORMAL HIGH (ref 70–99)
Potassium: 4 mmol/L (ref 3.5–5.1)
Sodium: 138 mmol/L (ref 135–145)
Total Bilirubin: 0.5 mg/dL (ref 0.3–1.2)
Total Protein: 7.7 g/dL (ref 6.5–8.1)

## 2023-07-01 LAB — LACTATE DEHYDROGENASE: LDH: 149 U/L (ref 98–192)

## 2023-07-01 MED ORDER — ALLOPURINOL 100 MG PO TABS: 100.0000 mg | ORAL_TABLET | Freq: Every day | ORAL | 0 refills | Status: DC

## 2023-07-01 MED FILL — Dexamethasone Sodium Phosphate Inj 100 MG/10ML: INTRAMUSCULAR | Qty: 1 | Status: AC

## 2023-07-01 NOTE — Progress Notes (Signed)
[facility] OFFICE PROGRESS NOTE   Diagnosis: CLL  INTERVAL HISTORY:   Chad Gross returns as scheduled.  He completed another treatment with Cemiplimab on 06/06/2023.  He reports no progression of skin lesions at the scalp or face. He continues to have anorexia and intermittent night sweats.  No fever.  Objective:  Vital signs in last 24 hours:  Blood pressure 121/74, pulse 82, temperature 98.1 F (36.7 C), temperature source Oral, resp. rate 18, height 5\' 11"  (1.803 m), weight 191 lb 12.8 oz (87 kg), SpO2 98%.    Lymphatics: 1/2 cm left anterior cervical node, no supraclavicular, axillary, or inguinal nodes. Resp: Lungs clear bilaterally Cardio: Regular rate and rhythm GI: Masslike fullness throughout the central abdomen most prominent to the left of midline and above the umbilicus Vascular: No leg edema  Skin: Scar at the left forehead, no apparent skin cancers over the forehead or face  Lab Results:  Lab Results  Component Value Date   WBC 13.6 (H) 07/01/2023   HGB 13.5 07/01/2023   HCT 40.0 07/01/2023   MCV 93.0 07/01/2023   PLT 71 (L) 07/01/2023   NEUTROABS PENDING 07/01/2023    CMP  Lab Results  Component Value Date   NA 136 06/06/2023   K 4.0 06/06/2023   CL 102 06/06/2023   CO2 24 06/06/2023   GLUCOSE 91 06/06/2023   BUN 32 (H) 06/06/2023   CREATININE 1.44 (H) 06/06/2023   CALCIUM 9.8 06/06/2023   PROT 7.8 06/06/2023   ALBUMIN 4.4 06/06/2023   AST 17 06/06/2023   ALT 8 06/06/2023   ALKPHOS 38 06/06/2023   BILITOT 0.8 06/06/2023   GFRNONAA 50 (L) 06/06/2023   GFRAA 52 (L) 08/13/2020     Medications: I have reviewed the patient's current medications.   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 He will return for a monthly PT check.  Indwelling IVC catheter.  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        4. 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 erectile dysfunction-followed by Dr. Annabell Gross , status post placement of a penile implant at [facility] in April 2014            7. CT of the abdomen/pelvis at [facility] February 2014 for evaluation of hematuria-12 mm external iliac nodes and left periaortic retroperitoneal lymph node-11 mm-potentially related to CLL      8.  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, peripheral lymphocytosis and small palpable lymph nodes          CT abdomen/pelvis 09/18/2019-extensive abdominal pelvic lymphadenopathy including a left periaortic node measuring 5.7 cm 08/13/2020 CLL FISH panel-no evidence of trisomy 12; no evidence of [id number]; no evidence of p53 deletion or amplification; deletion of ATM present 11/05/2020-peripheral blood TP53 mutation-negative Cycle 1 bendamustine/Rituxan 10/08/2020, 10/09/2020 Cycle 2 bendamustine/Rituxan 11/05/2020, 11/06/2020 Cycle 3 bendamustine/Rituxan 12/03/2020, 12/04/2020 Cycle 4 bendamustine/Rituxan 12/31/2020, 01/01/2021 CTs 01/19/2021-decrease in bulky abdomen/pelvic lymphadenopathy with more widespread ill-defined "haziness "in the mesentery and retroperitoneal fat, decreased pelvic lymphadenopathy Cycle 5 bendamustine/Rituxan 01/28/2021, 01/29/2021 Cycle 6 bendamustine/rituximab 02/25/2021, 02/26/2021 CT 02/22/2023-mild generalized lymphadenopathy in the neck, significant increase in chest andabdominal/pelvic lymph nodes, dominant nodal mass in the mesentery CT 06/25/2023-progressive lymphadenopathy in the abdomen and pelvis, dominant confluent mass in the upper abdomen/retroperitoneum has enlarged, stable small nodes in the chest  9.  Basal cell carcinoma removed from the left superior central forehead 01/21/2020    10.  Renal insufficiency    11.  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  Indeterminate left hepatic lobe lesion on CT 02/22/2023 13.  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  There is clinical and radiologic evidence of disease progression.  We discussed treatment options.  He prefers repeat treatment with Bendamustine/rituximab as opposed to a BTK inhibitor.  I have a low clinical suspicion for transformation to a high-grade lymphoma.  We will check an LDH today.  We will get delayed biopsy of the dominant abdominal mass if he does not respond to Bendamustine/rituximab.  We reviewed potential toxicities associated with the Bendamustine/rituximab regimen including the chance of allergic reaction, atypical infections, and hematologic toxicity.  He agrees to proceed.  He is scheduled to see Dr. Tama Gross for evaluation of the squamous cell carcinomas next week.  The plan is to begin Bendamustine/rituximab on 07/07/2023.  He will return for an office visit and nadir CBC on 07/22/2023.  Cemiplimab will be placed on hold.  A chemotherapy plan was entered today.  Chad Papas, MD  07/01/2023  12:03 PM

## 2023-07-01 NOTE — Telephone Encounter (Signed)
Informed Chad Gross that he needs to begin allopurinol daily x 10 days starting on 07/06/23 and hepatitis testing could not be added on today. Come at 0815 on 8/8 to have this lab drawn prior to treatment. He understands and agrees.

## 2023-07-01 NOTE — Progress Notes (Signed)
START OFF PATHWAY REGIMEN - Lymphoma and CLL   [id number]:Bendamustine 70 mg/m2 IV D1,2 + Rituximab 375/500 mg/m2 IV D1 q28 Days x 6 Cycles:   Cycle 1: A cycle is 28 days:     Rituximab-xxxx      Bendamustine    Cycles 2 through 6: A cycle is every 28 days:     Rituximab-xxxx      Bendamustine   **Always confirm dose/schedule in your pharmacy ordering system**  Patient Characteristics: Chronic Lymphocytic Leukemia (CLL), Treatment Indicated, First Line Disease Type: Chronic Lymphocytic Leukemia (CLL) Disease Type: Not Applicable Disease Type: Not Applicable Treatment Indicated<= Treatment Indicated Line of Therapy: First Line Intent of Therapy: Non-Curative / Palliative Intent, Discussed with Patient

## 2023-07-01 NOTE — Progress Notes (Signed)
Pharmacist Chemotherapy Monitoring - Initial Assessment    Anticipated start date: 07/07/23   The following has been reviewed per standard work regarding the patient's treatment regimen: The patient's diagnosis, treatment plan and drug doses, and organ/hematologic function Lab orders and baseline tests specific to treatment regimen  The treatment plan start date, drug sequencing, and pre-medications Prior authorization status  Patient's documented medication list, including drug-drug interaction screen and prescriptions for anti-emetics and supportive care specific to the treatment regimen The drug concentrations, fluid compatibility, administration routes, and timing of the medications to be used The patient's access for treatment and lifetime cumulative dose history, if applicable  The patient's medication allergies and previous infusion related reactions, if applicable   Changes made to treatment plan:  N/A  Follow up needed:  Pending authorization for treatment  Labs for hep B suppose to be added on to 8/2 labs if they could  Daylene Katayama, [facility], 07/01/2023  2:38 PM

## 2023-07-03 ENCOUNTER — Other Ambulatory Visit: Payer: Self-pay | Admitting: Oncology

## 2023-07-04 ENCOUNTER — Other Ambulatory Visit: Payer: Self-pay | Admitting: Oncology

## 2023-07-04 ENCOUNTER — Telehealth: Payer: Self-pay | Admitting: Nurse Practitioner

## 2023-07-04 DIAGNOSIS — Z7901 Long term (current) use of anticoagulants: Secondary | ICD-10-CM

## 2023-07-04 NOTE — Telephone Encounter (Signed)
Scheduled per 08/02 los, patient has been called and voicemail was left of upcoming appointments.

## 2023-07-07 ENCOUNTER — Encounter: Payer: Self-pay | Admitting: *Deleted

## 2023-07-07 ENCOUNTER — Encounter: Payer: Self-pay | Admitting: Oncology

## 2023-07-07 ENCOUNTER — Inpatient Hospital Stay: Payer: MEDICARE

## 2023-07-07 ENCOUNTER — Other Ambulatory Visit: Payer: Self-pay | Admitting: Oncology

## 2023-07-07 VITALS — BP 115/42 | HR 81 | Temp 99.2°F | Resp 18

## 2023-07-07 DIAGNOSIS — Z5111 Encounter for antineoplastic chemotherapy: Secondary | ICD-10-CM | POA: Diagnosis not present

## 2023-07-07 DIAGNOSIS — C9110 Chronic lymphocytic leukemia of B-cell type not having achieved remission: Secondary | ICD-10-CM

## 2023-07-07 LAB — HEPATITIS B CORE ANTIBODY, TOTAL: Hep B Core Total Ab: NONREACTIVE

## 2023-07-07 LAB — HEPATITIS B SURFACE ANTIGEN: Hepatitis B Surface Ag: NONREACTIVE

## 2023-07-07 LAB — URIC ACID: Uric Acid, Serum: 6.9 mg/dL (ref 3.7–8.6)

## 2023-07-07 MED ORDER — SODIUM CHLORIDE 0.9 % IV SOLN
10.0000 mg | Freq: Once | INTRAVENOUS | Status: AC
  Administered 2023-07-07: 10 mg via INTRAVENOUS
  Filled 2023-07-07: qty 10

## 2023-07-07 MED ORDER — SODIUM CHLORIDE 0.9 % IV SOLN: Freq: Once | INTRAVENOUS | Status: AC

## 2023-07-07 MED ORDER — PALONOSETRON HCL INJECTION 0.25 MG/5ML
0.2500 mg | Freq: Once | INTRAVENOUS | Status: AC
  Administered 2023-07-07: 0.25 mg via INTRAVENOUS
  Filled 2023-07-07: qty 5

## 2023-07-07 MED ORDER — SODIUM CHLORIDE 0.9 % IV SOLN
50.0000 mg/m2 | Freq: Once | INTRAVENOUS | Status: AC
  Administered 2023-07-07: 100 mg via INTRAVENOUS
  Filled 2023-07-07: qty 4

## 2023-07-07 MED ORDER — ACETAMINOPHEN 325 MG PO TABS
650.0000 mg | ORAL_TABLET | Freq: Once | ORAL | Status: AC
  Administered 2023-07-07: 650 mg via ORAL
  Filled 2023-07-07: qty 2

## 2023-07-07 MED ORDER — SODIUM CHLORIDE 0.9 % IV SOLN
375.0000 mg/m2 | Freq: Once | INTRAVENOUS | Status: AC
  Administered 2023-07-07: 800 mg via INTRAVENOUS
  Filled 2023-07-07: qty 50

## 2023-07-07 MED ORDER — DIPHENHYDRAMINE HCL 25 MG PO CAPS
50.0000 mg | ORAL_CAPSULE | Freq: Once | ORAL | Status: AC
  Administered 2023-07-07: 50 mg via ORAL
  Filled 2023-07-07: qty 2

## 2023-07-07 NOTE — Progress Notes (Signed)
Rituxan paused briefly at 1205 when patient returned from restroom. Patient complained of being cold and visibly shivering. Pump paused and vitals checked- see Mar. Patient provided with warm blankets. BP elevated, shivering quickly resolved and patient reported feeling warmer and back to normal quickly. Rituxan resumed at previous rate. Determined not a reaction to medication. Rate increased shortly after- VSS. Patient denies any complaints at rate increase.

## 2023-07-07 NOTE — Progress Notes (Signed)
  Labs reviewed by Dr. Truett Perna and are not all within treatment parameters. Platelet 71,000 and creatiine 1.61--OK to proceed without repeat lab  (labs are from 07/01/23)  Per physician team, patient is ready for treatment and there are NO modifications to the treatment plan. MD had already dose reduced for his low platelet count.

## 2023-07-07 NOTE — Patient Instructions (Signed)
[place] [facility] AT [facility]  Discharge Instructions: Thank you for choosing [facility] to provide your oncology and hematology care.   If you have a lab appointment with the Cancer Center, please go directly to the Cancer Center and check in at the registration area.   Wear comfortable clothing and clothing appropriate for easy access to any Portacath or PICC line.   We strive to give you quality time with your provider. You may need to reschedule your appointment if you arrive late (15 or more minutes).  Arriving late affects you and other patients whose appointments are after yours.  Also, if you miss three or more appointments without notifying the office, you may be dismissed from the clinic at the provider's discretion.      For prescription refill requests, have your pharmacy contact our office and allow 72 hours for refills to be completed.    Today you received the following chemotherapy and/or immunotherapy agents Rituxan and Bendeka      To help prevent nausea and vomiting after your treatment, we encourage you to take your nausea medication as directed.  BELOW ARE SYMPTOMS THAT SHOULD BE REPORTED IMMEDIATELY: *FEVER GREATER THAN 100.4 F (38 C) OR HIGHER *CHILLS OR SWEATING *NAUSEA AND VOMITING THAT IS NOT CONTROLLED WITH YOUR NAUSEA MEDICATION *UNUSUAL SHORTNESS OF BREATH *UNUSUAL BRUISING OR BLEEDING *URINARY PROBLEMS (pain or burning when urinating, or frequent urination) *BOWEL PROBLEMS (unusual diarrhea, constipation, pain near the anus) TENDERNESS IN MOUTH AND THROAT WITH OR WITHOUT PRESENCE OF ULCERS (sore throat, sores in mouth, or a toothache) UNUSUAL RASH, SWELLING OR PAIN  UNUSUAL VAGINAL DISCHARGE OR ITCHING   Items with * indicate a potential emergency and should be followed up as soon as possible or go to the Emergency Department if any problems should occur.  Please show the CHEMOTHERAPY ALERT CARD or IMMUNOTHERAPY ALERT CARD  at check-in to the Emergency Department and triage nurse.  Should you have questions after your visit or need to cancel or reschedule your appointment, please contact [facility]  Dept: [phone]  and follow the prompts.  Office hours are 8:00 a.m. to 4:30 p.m. Monday - Friday. Please note that voicemails left after 4:00 p.m. may not be returned until the following business day.  We are closed weekends and major holidays. You have access to a nurse at all times for urgent questions. Please call the main number to the clinic Dept: [phone] and follow the prompts.   For any non-urgent questions, you may also contact your provider using MyChart. We now offer e-Visits for anyone 17 and older to request care online for non-urgent symptoms. For details visit [email or web address].   Also download the MyChart app! Go to the app store, search "MyChart", open the app, select [place], and log in with your MyChart username and password.

## 2023-07-08 ENCOUNTER — Inpatient Hospital Stay: Payer: MEDICARE

## 2023-07-08 VITALS — BP 107/57 | HR 64 | Temp 98.1°F | Resp 18

## 2023-07-08 DIAGNOSIS — Z5111 Encounter for antineoplastic chemotherapy: Secondary | ICD-10-CM | POA: Diagnosis not present

## 2023-07-08 DIAGNOSIS — C9110 Chronic lymphocytic leukemia of B-cell type not having achieved remission: Secondary | ICD-10-CM

## 2023-07-08 MED ORDER — SODIUM CHLORIDE 0.9 % IV SOLN
50.0000 mg/m2 | Freq: Once | INTRAVENOUS | Status: AC
  Administered 2023-07-08: 100 mg via INTRAVENOUS
  Filled 2023-07-08: qty 4

## 2023-07-08 MED ORDER — SODIUM CHLORIDE 0.9 % IV SOLN: Freq: Once | INTRAVENOUS | Status: AC

## 2023-07-08 MED ORDER — SODIUM CHLORIDE 0.9 % IV SOLN
10.0000 mg | Freq: Once | INTRAVENOUS | Status: AC
  Administered 2023-07-08: 10 mg via INTRAVENOUS
  Filled 2023-07-08: qty 10

## 2023-07-08 NOTE — Patient Instructions (Signed)
 [place] [facility]   Discharge Instructions: Thank you for choosing [facility] to provide your oncology and hematology care.   If you have a lab appointment with the Cancer Center, please go directly to the Cancer Center and check in at the registration area.   Wear comfortable clothing and clothing appropriate for easy access to any Portacath or PICC line.   We strive to give you quality time with your provider. You may need to reschedule your appointment if you arrive late (15 or more minutes).  Arriving late affects you and other patients whose appointments are after yours.  Also, if you miss three or more appointments without notifying the office, you may be dismissed from the clinic at the provider's discretion.      For prescription refill requests, have your pharmacy contact our office and allow 72 hours for refills to be completed.    Today you received the following chemotherapy and/or immunotherapy agents Bendeka.      To help prevent nausea and vomiting after your treatment, we encourage you to take your nausea medication as directed.  BELOW ARE SYMPTOMS THAT SHOULD BE REPORTED IMMEDIATELY: *FEVER GREATER THAN 100.4 F (38 C) OR HIGHER *CHILLS OR SWEATING *NAUSEA AND VOMITING THAT IS NOT CONTROLLED WITH YOUR NAUSEA MEDICATION *UNUSUAL SHORTNESS OF BREATH *UNUSUAL BRUISING OR BLEEDING *URINARY PROBLEMS (pain or burning when urinating, or frequent urination) *BOWEL PROBLEMS (unusual diarrhea, constipation, pain near the anus) TENDERNESS IN MOUTH AND THROAT WITH OR WITHOUT PRESENCE OF ULCERS (sore throat, sores in mouth, or a toothache) UNUSUAL RASH, SWELLING OR PAIN  UNUSUAL VAGINAL DISCHARGE OR ITCHING   Items with * indicate a potential emergency and should be followed up as soon as possible or go to the Emergency Department if any problems should occur.  Please show the CHEMOTHERAPY ALERT CARD or IMMUNOTHERAPY ALERT CARD at  check-in to the Emergency Department and triage nurse.  Should you have questions after your visit or need to cancel or reschedule your appointment, please contact [place] [facility]  Dept: [phone]  and follow the prompts.  Office hours are 8:00 a.m. to 4:30 p.m. Monday - Friday. Please note that voicemails left after 4:00 p.m. may not be returned until the following business day.  We are closed weekends and major holidays. You have access to a nurse at all times for urgent questions. Please call the main number to the clinic Dept: [phone] and follow the prompts.   For any non-urgent questions, you may also contact your provider using MyChart. We now offer e-Visits for anyone 38 and older to request care online for non-urgent symptoms. For details visit [email or web address].   Also download the MyChart app! Go to the app store, search "MyChart", open the app, select , and log in with your MyChart username and password.  Bendamustine Injection What is this medication? BENDAMUSTINE (BEN da MUS teen) treats leukemia and lymphoma. It works by slowing down the growth of cancer cells. This medicine may be used for other purposes; ask your health care provider or pharmacist if you have questions. COMMON BRAND NAME(S): Marilynne Halsted, VIVIMUSTA What should I tell my care team before I take this medication? They need to know if you have any of these conditions: Infection, especially a viral infection, such as chickenpox, cold sores, herpes Kidney disease Liver disease An unusual or allergic reaction to bendamustine, mannitol, other medications, foods, dyes, or preservatives Pregnant or trying to  get pregnant Breast-feeding How should I use this medication? This medication is injected into a vein. It is given by your care team in a hospital or clinic setting. Talk to your care team about the use of this medication in children. Special care  may be needed. Overdosage: If you think you have taken too much of this medicine contact a poison control center or emergency room at once. NOTE: This medicine is only for you. Do not share this medicine with others. What if I miss a dose? Keep appointments for follow-up doses. It is important not to miss your dose. Call your care team if you are unable to keep an appointment. What may interact with this medication? Do not take this medication with any of the following: Clozapine This medication may also interact with the following: Atazanavir Cimetidine Ciprofloxacin Enoxacin Fluvoxamine Medications for seizures, such as carbamazepine, phenobarbital Mexiletine Rifampin Tacrine Thiabendazole Zileuton This list may not describe all possible interactions. Give your health care provider a list of all the medicines, herbs, non-prescription drugs, or dietary supplements you use. Also tell them if you smoke, drink alcohol, or use illegal drugs. Some items may interact with your medicine. What should I watch for while using this medication? Visit your care team for regular checks on your progress. This medication may make you feel generally unwell. This is not uncommon, as chemotherapy can affect healthy cells as well as cancer cells. Report any side effects. Continue your course of treatment even though you feel ill unless your care team tells you to stop. You may need blood work while taking this medication. This medication may increase your risk of getting an infection. Call your care team for advice if you get a fever, chills, sore throat, or other symptoms of a cold or flu. Do not treat yourself. Try to avoid being around people who are sick. This medication may cause serious skin reactions. They can happen weeks to months after starting the medication. Contact your care team right away if you notice fevers or flu-like symptoms with a rash. The rash may be red or purple and then turn into  blisters or peeling of the skin. You may also notice a red rash with swelling of the face, lips, or lymph nodes in your neck or under your arms. In some patients, this medication may cause a serious brain infection that may cause death. If you have any problems seeing, thinking, speaking, walking, or standing, tell your care team right away. If you cannot reach your care team, urgently seek other source of medical care. This medication may increase your risk to bruise or bleed. Call your care team if you notice any unusual bleeding. Talk to your care team about your risk of cancer. You may be more at risk for certain types of cancer if you take this medication. Talk to your care team about your risk of skin cancer. You may be more at risk for skin cancer if you take this medication. Talk to your care team if you or your partner wish to become pregnant or think either of you might be pregnant. This medication can cause serious birth defects if taken during pregnancy or for up to 6 months after the last dose. A negative pregnancy test is required before starting this medication. A reliable form of contraception is recommended while taking this medication and for 6 months after the last dose. Talk to your care team about reliable forms of contraception. Wear a condom while taking this  medication and for at least 3 months after the last dose. Do not breast-feed while taking this medication or for at least 1 week after the last dose. This medication may cause infertility. Talk to your care team if you are concerned about your fertility. What side effects may I notice from receiving this medication? Side effects that you should report to your care team as soon as possible: Allergic reactions--skin rash, itching, hives, swelling of the face, lips, tongue, or throat Infection--fever, chills, cough, sore throat, wounds that don't heal, pain or trouble when passing urine, general feeling of discomfort or being  unwell Infusion reactions--chest pain, shortness of breath or trouble breathing, feeling faint or lightheaded Liver injury--right upper belly pain, loss of appetite, nausea, light-colored stool, dark yellow or brown urine, yellowing skin or eyes, unusual weakness or fatigue Low red blood cell level--unusual weakness or fatigue, dizziness, headache, trouble breathing Painful swelling, warmth, or redness of the skin, blisters or sores at the infusion site Rash, fever, and swollen lymph nodes Redness, blistering, peeling, or loosening of the skin, including inside the mouth Tumor lysis syndrome (TLS)--nausea, vomiting, diarrhea, decrease in the amount of urine, dark urine, unusual weakness or fatigue, confusion, muscle pain or cramps, fast or irregular heartbeat, joint pain Unusual bruising or bleeding Side effects that usually do not require medical attention (report to your care team if they continue or are bothersome): Diarrhea Fatigue Headache Loss of appetite Nausea Vomiting This list may not describe all possible side effects. Call your doctor for medical advice about side effects. You may report side effects to FDA at 1-800-FDA-1088. Where should I keep my medication? This medication is given in a hospital or clinic. It will not be stored at home. NOTE: This sheet is a summary. It may not cover all possible information. If you have questions about this medicine, talk to your doctor, pharmacist, or health care provider.  2024 Elsevier/Gold Standard (2022-03-09 00:00:00)

## 2023-07-08 NOTE — Progress Notes (Signed)
Patient here today for 2nd dose of Bendeka. Patient received Rituxaimab/Bendeka yesterday for the first time since 12/03/2020. Patient reports today that he feels well, had no side effects or sign of reaction.

## 2023-07-12 ENCOUNTER — Other Ambulatory Visit: Payer: Self-pay

## 2023-07-13 ENCOUNTER — Encounter: Payer: Self-pay | Admitting: Oncology

## 2023-07-19 ENCOUNTER — Encounter (HOSPITAL_BASED_OUTPATIENT_CLINIC_OR_DEPARTMENT_OTHER): Payer: Self-pay | Admitting: Family Medicine

## 2023-07-20 ENCOUNTER — Ambulatory Visit (INDEPENDENT_AMBULATORY_CARE_PROVIDER_SITE_OTHER): Payer: MEDICARE | Admitting: Family Medicine

## 2023-07-20 ENCOUNTER — Other Ambulatory Visit (HOSPITAL_BASED_OUTPATIENT_CLINIC_OR_DEPARTMENT_OTHER): Payer: Self-pay

## 2023-07-20 ENCOUNTER — Encounter (HOSPITAL_BASED_OUTPATIENT_CLINIC_OR_DEPARTMENT_OTHER): Payer: Self-pay | Admitting: Family Medicine

## 2023-07-20 VITALS — BP 134/72 | HR 98 | Temp 100.9°F | Ht 71.0 in | Wt 188.1 lb

## 2023-07-20 DIAGNOSIS — J069 Acute upper respiratory infection, unspecified: Secondary | ICD-10-CM

## 2023-07-20 DIAGNOSIS — R059 Cough, unspecified: Secondary | ICD-10-CM | POA: Diagnosis not present

## 2023-07-20 LAB — POC COVID19 BINAXNOW: SARS Coronavirus 2 Ag: NEGATIVE

## 2023-07-20 MED ORDER — BENZONATATE 200 MG PO CAPS: 200.0000 mg | ORAL_CAPSULE | Freq: Three times a day (TID) | ORAL | 0 refills | Status: DC | PRN

## 2023-07-20 NOTE — Progress Notes (Signed)
    Procedures performed today:    None.  Independent interpretation of notes and tests performed by another provider:   None.  Brief History, Exam, Impression, and Recommendations:    BP 134/72   Pulse 98   Temp (!) 100.9 F (38.3 C) (Oral)   Ht 5\' 11"  (1.803 m)   Wt 188 lb 1.6 oz (85.3 kg)   SpO2 98%   BMI 26.23 kg/m   Cough, unspecified type -     POC COVID-19 BinaxNow  Viral URI Assessment & Plan: Patient presents for evaluation of cough, fever, body aches.  Symptoms started about 3 to 4 days ago.  He is not aware of any sick contacts.  He reports that he has had fever around 100.9 degrees.  He has been using Tylenol for fever and bodyaches.  He is up-to-date with COVID-vaccine and booster.  He notes some mild shortness of breath with walking his, however no significant symptoms with household activities. On exam, patient is in no acute distress, vital signs stable, patient is febrile here today. Cardiovascular exam with regular rate and rhythm, lungs clear to auscultation bilaterally.  Right EAC clear, normal-appearing tympanic membrane.  Left EAC with moderate amount of cerumen present.  Mild pharyngeal erythema, no cervical lymphadenopathy. Point-of-care COVID testing completed in office which was negative.  Suspect underlying viral etiology for current respiratory illness.  Discussed general recommendations including utilization of OTC medications to help with headaches, body aches, fever.  Can send prescription for Tessalon Perles to pharmacy to use to help with cough.  Also discussed consideration of utilizing honey to help with cough.  Recommend adequate rest and hydration.  Discussed expectation for symptom improvement over the coming 3 to 4 days.  If symptoms do improve, however if subsequent worsening occurs, it is possible that this could indicate new superimposed bacterial infection.  Recommend returning to the office should any worsening occur   Other orders -      Benzonatate; Take 1 capsule (200 mg total) by mouth 3 (three) times daily as needed for cough.  Dispense: 45 capsule; Refill: 0  Return if symptoms worsen or fail to improve.   ___________________________________________ Gao Mitnick de [place], MD, ABFM, CAQSM Primary Care and Sports Medicine [facility]

## 2023-07-20 NOTE — Assessment & Plan Note (Signed)
Patient presents for evaluation of cough, fever, body aches.  Symptoms started about 3 to 4 days ago.  He is not aware of any sick contacts.  He reports that he has had fever around 100.9 degrees.  He has been using Tylenol for fever and bodyaches.  He is up-to-date with COVID-vaccine and booster.  He notes some mild shortness of breath with walking his, however no significant symptoms with household activities. On exam, patient is in no acute distress, vital signs stable, patient is febrile here today. Cardiovascular exam with regular rate and rhythm, lungs clear to auscultation bilaterally.  Right EAC clear, normal-appearing tympanic membrane.  Left EAC with moderate amount of cerumen present.  Mild pharyngeal erythema, no cervical lymphadenopathy. Point-of-care COVID testing completed in office which was negative.  Suspect underlying viral etiology for current respiratory illness.  Discussed general recommendations including utilization of OTC medications to help with headaches, body aches, fever.  Can send prescription for Tessalon Perles to pharmacy to use to help with cough.  Also discussed consideration of utilizing honey to help with cough.  Recommend adequate rest and hydration.  Discussed expectation for symptom improvement over the coming 3 to 4 days.  If symptoms do improve, however if subsequent worsening occurs, it is possible that this could indicate new superimposed bacterial infection.  Recommend returning to the office should any worsening occur

## 2023-07-22 ENCOUNTER — Other Ambulatory Visit: Payer: Self-pay

## 2023-07-22 ENCOUNTER — Inpatient Hospital Stay (HOSPITAL_BASED_OUTPATIENT_CLINIC_OR_DEPARTMENT_OTHER): Payer: MEDICARE | Admitting: Nurse Practitioner

## 2023-07-22 ENCOUNTER — Inpatient Hospital Stay: Payer: MEDICARE

## 2023-07-22 ENCOUNTER — Encounter: Payer: Self-pay | Admitting: Nurse Practitioner

## 2023-07-22 ENCOUNTER — Ambulatory Visit (HOSPITAL_BASED_OUTPATIENT_CLINIC_OR_DEPARTMENT_OTHER)
Admission: RE | Admit: 2023-07-22 | Discharge: 2023-07-22 | Disposition: A | Discharge status: Home / Self Care | Payer: MEDICARE | Source: Ambulatory Visit | Attending: Nurse Practitioner | Admitting: Nurse Practitioner

## 2023-07-22 VITALS — BP 138/76 | HR 92 | Temp 98.4°F | Resp 20 | Ht 71.0 in | Wt 185.4 lb

## 2023-07-22 DIAGNOSIS — C9110 Chronic lymphocytic leukemia of B-cell type not having achieved remission: Secondary | ICD-10-CM

## 2023-07-22 DIAGNOSIS — Z1152 Encounter for screening for COVID-19: Secondary | ICD-10-CM | POA: Insufficient documentation

## 2023-07-22 DIAGNOSIS — R509 Fever, unspecified: Secondary | ICD-10-CM | POA: Insufficient documentation

## 2023-07-22 DIAGNOSIS — R059 Cough, unspecified: Secondary | ICD-10-CM | POA: Insufficient documentation

## 2023-07-22 DIAGNOSIS — Z5111 Encounter for antineoplastic chemotherapy: Secondary | ICD-10-CM | POA: Diagnosis not present

## 2023-07-22 LAB — CMP (CANCER CENTER ONLY)
ALT: 7 U/L (ref 0–44)
AST: 16 U/L (ref 15–41)
Albumin: 3.9 g/dL (ref 3.5–5.0)
Alkaline Phosphatase: 32 U/L — ABNORMAL LOW (ref 38–126)
Anion gap: 9 (ref 5–15)
BUN: 33 mg/dL — ABNORMAL HIGH (ref 8–23)
CO2: 26 mmol/L (ref 22–32)
Calcium: 9.9 mg/dL (ref 8.9–10.3)
Chloride: 99 mmol/L (ref 98–111)
Creatinine: 1.43 mg/dL — ABNORMAL HIGH (ref 0.61–1.24)
GFR, Estimated: 50 mL/min — ABNORMAL LOW (ref >60)
Glucose, Bld: 93 mg/dL (ref 70–99)
Potassium: 3.8 mmol/L (ref 3.5–5.1)
Sodium: 134 mmol/L — ABNORMAL LOW (ref 135–145)
Total Bilirubin: 0.9 mg/dL (ref 0.3–1.2)
Total Protein: 7.2 g/dL (ref 6.5–8.1)

## 2023-07-22 LAB — CBC WITH DIFFERENTIAL (CANCER CENTER ONLY)
Abs Immature Granulocytes: 0.02 10*3/uL (ref 0.00–0.07)
Basophils Absolute: 0 10*3/uL (ref 0.0–0.1)
Basophils Relative: 0 %
Eosinophils Absolute: 0 10*3/uL (ref 0.0–0.5)
Eosinophils Relative: 0 %
HCT: 34 % — ABNORMAL LOW (ref 39.0–52.0)
Hemoglobin: 11.7 g/dL — ABNORMAL LOW (ref 13.0–17.0)
Immature Granulocytes: 0 %
Lymphocytes Relative: 54 %
Lymphs Abs: 3.8 10*3/uL (ref 0.7–4.0)
MCH: 31.5 pg (ref 26.0–34.0)
MCHC: 34.4 g/dL (ref 30.0–36.0)
MCV: 91.6 fL (ref 80.0–100.0)
Monocytes Absolute: 0.4 10*3/uL (ref 0.1–1.0)
Monocytes Relative: 6 %
Neutro Abs: 2.9 10*3/uL (ref 1.7–7.7)
Neutrophils Relative %: 40 %
Platelet Count: 75 10*3/uL — ABNORMAL LOW (ref 150–400)
RBC: 3.71 MIL/uL — ABNORMAL LOW (ref 4.22–5.81)
RDW: 14 % (ref 11.5–15.5)
WBC Count: 7.2 10*3/uL (ref 4.0–10.5)
nRBC: 0 % (ref 0.0–0.2)

## 2023-07-22 LAB — URIC ACID: Uric Acid, Serum: 7.6 mg/dL (ref 3.7–8.6)

## 2023-07-22 LAB — RESP PANEL BY RT-PCR (RSV, FLU A&B, COVID)  RVPGX2
Influenza A by PCR: NEGATIVE
Influenza B by PCR: NEGATIVE
Resp Syncytial Virus by PCR: NEGATIVE
SARS Coronavirus 2 by RT PCR: NEGATIVE

## 2023-07-22 MED ORDER — LEVOFLOXACIN 500 MG PO TABS
500.0000 mg | ORAL_TABLET | Freq: Every day | ORAL | 0 refills | Status: DC
Start: 2023-07-22 — End: 2023-08-08

## 2023-07-22 NOTE — Progress Notes (Unsigned)
[facility] OFFICE PROGRESS NOTE   Diagnosis: CLL  INTERVAL HISTORY:   [name] returns as scheduled.  He completed cycle 1 Bendamustine/Rituxan 07/07/2023, 07/08/2023.  He feels he tolerated treatment well.  No nausea or vomiting.  No mouth sores.  No diarrhea.  No rash.  No signs of reaction with Rituxan.  Last week he began feeling unwell.  He is having arthralgias, cough, fever, chills, mild shortness of breath and significant fatigue.  The cough is intermittently productive.  Symptoms have persisted.  He saw Dr. De Peru 07/20/2023.  COVID test was negative.  Objective:  Vital signs in last 24 hours:  Blood pressure 116/84, pulse 95, temperature 98.5 F (36.9 C), temperature source Oral, resp. rate 18, height 5\' 11"  (1.803 m), weight 185 lb 6.4 oz (84.1 kg), SpO2 99%.    HEENT: No thrush or ulcers. Resp: Lungs clear bilaterally.  No respiratory distress. Cardio: Regular rate and rhythm. GI: Masslike firmness with associated tenderness mid and left abdomen above the level of the umbilicus. Vascular: No leg edema. Neuro: Alert and oriented. Skin: Warm.    Lab Results:  Lab Results  Component Value Date   WBC 7.2 07/22/2023   HGB 11.7 (L) 07/22/2023   HCT 34.0 (L) 07/22/2023   MCV 91.6 07/22/2023   PLT 75 (L) 07/22/2023   NEUTROABS 2.9 07/22/2023    Imaging:  No results found.  Medications: I have reviewed the patient's current medications.  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 He will return for a monthly PT check.  Indwelling IVC catheter.  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        4. 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 erectile dysfunction-followed by Dr. Annabell Howells , status post placement of a penile implant at [facility] in April 2014            7. CT of the abdomen/pelvis at [facility] February  2014 for evaluation of hematuria-12 mm external iliac nodes and left periaortic retroperitoneal lymph node-11 mm-potentially related to CLL      8.  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, peripheral lymphocytosis and small palpable lymph nodes          CT abdomen/pelvis 09/18/2019-extensive abdominal pelvic lymphadenopathy including a left periaortic node measuring 5.7 cm 08/13/2020 CLL FISH panel-no evidence of trisomy 12; no evidence of [id number]; no evidence of p53 deletion or amplification; deletion of ATM present 11/05/2020-peripheral blood TP53 mutation-negative Cycle 1 bendamustine/Rituxan 10/08/2020, 10/09/2020 Cycle 2 bendamustine/Rituxan 11/05/2020, 11/06/2020 Cycle 3 bendamustine/Rituxan 12/03/2020, 12/04/2020 Cycle 4 bendamustine/Rituxan 12/31/2020, 01/01/2021 CTs 01/19/2021-decrease in bulky abdomen/pelvic lymphadenopathy with more widespread ill-defined "haziness "in the mesentery and retroperitoneal fat, decreased pelvic lymphadenopathy Cycle 5 bendamustine/Rituxan 01/28/2021, 01/29/2021 Cycle 6 bendamustine/rituximab 02/25/2021, 02/26/2021 CT 02/22/2023-mild generalized lymphadenopathy in the neck, significant increase in chest andabdominal/pelvic lymph nodes, dominant nodal mass in the mesentery CT 06/25/2023-progressive lymphadenopathy in the abdomen and pelvis, dominant confluent mass in the upper abdomen/retroperitoneum has enlarged, stable small nodes in the chest Cycle 1 Bendamustine/Rituxan 07/07/2023, 07/08/2023     9.  Basal cell carcinoma removed from the left superior central forehead 01/21/2020    10.  Renal insufficiency    11.  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  Indeterminate left hepatic lobe lesion on CT 02/22/2023 13.  Fever and altered mental status following cycle 2 Cemiplimab-no source for infection identified    Disposition: Mr. Sanders appears stable.  He completed cycle 1 Bendamustine/Rituxan 07/07/2023.  He tolerated well.  CBC from today looks good.  He presents today with symptoms suggestive of a viral respiratory infection.  Respiratory panel is negative.  Chest x-ray unremarkable.  Due to his immunocompromised status we prescribed Levaquin 500 mg daily.  We discussed C. difficile and tendon rupture associated with fluoroquinolones.  He agrees to proceed.  He understands to go to the emergency department if he develops shortness of breath, persistent fever/chills.  He will return for a follow-up PT/INR 07/25/2023.  We are scheduled to see him in 08/08/2023.  Patient seen with Dr. Truett Perna.    Lonna Cobb ANP/GNP-BC   07/22/2023  2:52 PM  Addendum 4:40 PM-I contacted Mr. Orlov's significant other with the chest x-ray and respiratory panel results.  This was a shared visit with Lonna Cobb.  Mr Butta was interviewed and examined. He is now at D16 following bendamustine/rituxan.  He presents with a URI. Covid and influenza testing are negative.  He will begin Levaquin.  I was present for greater than 50% of today's visit. I performed medical decision making.  Mancel Bale, MD

## 2023-07-24 ENCOUNTER — Encounter: Payer: Self-pay | Admitting: Oncology

## 2023-07-25 ENCOUNTER — Telehealth: Payer: Self-pay | Admitting: *Deleted

## 2023-07-25 ENCOUNTER — Encounter: Payer: Self-pay | Admitting: Oncology

## 2023-07-25 ENCOUNTER — Inpatient Hospital Stay: Payer: MEDICARE

## 2023-07-25 DIAGNOSIS — Z5111 Encounter for antineoplastic chemotherapy: Secondary | ICD-10-CM | POA: Diagnosis not present

## 2023-07-25 DIAGNOSIS — C9110 Chronic lymphocytic leukemia of B-cell type not having achieved remission: Secondary | ICD-10-CM

## 2023-07-25 LAB — CULTURE, RESPIRATORY W GRAM STAIN
Culture: NORMAL
Gram Stain: NONE SEEN

## 2023-07-25 LAB — PROTIME-INR
INR: 2.2 — ABNORMAL HIGH (ref 0.8–1.2)
Prothrombin Time: 24.3 seconds — ABNORMAL HIGH (ref 11.4–15.2)

## 2023-07-25 NOTE — Telephone Encounter (Signed)
Confirmed w/Chad Gross his current warfarin is 10 mg daily, except 12.5 mg M & Th. He reports still has productive cough and temp max over weekend was 100.3, but goes down. Chest sore from coughing. He is taking the benzonate tid for cough. Asking if he needs to finishe the Levaquin? He requested MD response be sent via Mychart.

## 2023-07-26 ENCOUNTER — Encounter: Payer: Self-pay | Admitting: Oncology

## 2023-08-04 ENCOUNTER — Encounter: Payer: Self-pay | Admitting: Oncology

## 2023-08-07 ENCOUNTER — Other Ambulatory Visit: Payer: Self-pay | Admitting: Oncology

## 2023-08-08 ENCOUNTER — Inpatient Hospital Stay: Payer: MEDICARE

## 2023-08-08 ENCOUNTER — Encounter: Payer: Self-pay | Admitting: *Deleted

## 2023-08-08 ENCOUNTER — Inpatient Hospital Stay (HOSPITAL_BASED_OUTPATIENT_CLINIC_OR_DEPARTMENT_OTHER): Payer: MEDICARE | Admitting: Oncology

## 2023-08-08 ENCOUNTER — Inpatient Hospital Stay: Payer: MEDICARE | Attending: Oncology

## 2023-08-08 VITALS — BP 100/61 | HR 72 | Temp 97.5°F | Resp 18

## 2023-08-08 VITALS — BP 139/70 | HR 79 | Temp 98.2°F | Resp 18 | Ht 71.0 in | Wt 187.6 lb

## 2023-08-08 DIAGNOSIS — C9110 Chronic lymphocytic leukemia of B-cell type not having achieved remission: Secondary | ICD-10-CM | POA: Insufficient documentation

## 2023-08-08 DIAGNOSIS — N529 Male erectile dysfunction, unspecified: Secondary | ICD-10-CM | POA: Insufficient documentation

## 2023-08-08 DIAGNOSIS — Z5111 Encounter for antineoplastic chemotherapy: Secondary | ICD-10-CM | POA: Insufficient documentation

## 2023-08-08 DIAGNOSIS — Z85828 Personal history of other malignant neoplasm of skin: Secondary | ICD-10-CM | POA: Diagnosis not present

## 2023-08-08 DIAGNOSIS — Z23 Encounter for immunization: Secondary | ICD-10-CM | POA: Diagnosis not present

## 2023-08-08 DIAGNOSIS — Z79899 Other long term (current) drug therapy: Secondary | ICD-10-CM | POA: Diagnosis not present

## 2023-08-08 DIAGNOSIS — D696 Thrombocytopenia, unspecified: Secondary | ICD-10-CM | POA: Diagnosis not present

## 2023-08-08 DIAGNOSIS — Z7901 Long term (current) use of anticoagulants: Secondary | ICD-10-CM | POA: Diagnosis not present

## 2023-08-08 LAB — CBC WITH DIFFERENTIAL (CANCER CENTER ONLY)
Abs Immature Granulocytes: 0.08 10*3/uL — ABNORMAL HIGH (ref 0.00–0.07)
Basophils Absolute: 0 10*3/uL (ref 0.0–0.1)
Basophils Relative: 1 %
Eosinophils Absolute: 0 10*3/uL (ref 0.0–0.5)
Eosinophils Relative: 0 %
HCT: 32.8 % — ABNORMAL LOW (ref 39.0–52.0)
Hemoglobin: 11.1 g/dL — ABNORMAL LOW (ref 13.0–17.0)
Immature Granulocytes: 2 %
Lymphocytes Relative: 34 %
Lymphs Abs: 1.9 10*3/uL (ref 0.7–4.0)
MCH: 32.1 pg (ref 26.0–34.0)
MCHC: 33.8 g/dL (ref 30.0–36.0)
MCV: 94.8 fL (ref 80.0–100.0)
Monocytes Absolute: 0.4 10*3/uL (ref 0.1–1.0)
Monocytes Relative: 7 %
Neutro Abs: 3.1 10*3/uL (ref 1.7–7.7)
Neutrophils Relative %: 56 %
Platelet Count: 132 10*3/uL — ABNORMAL LOW (ref 150–400)
RBC: 3.46 MIL/uL — ABNORMAL LOW (ref 4.22–5.81)
RDW: 14.5 % (ref 11.5–15.5)
WBC Count: 5.5 10*3/uL (ref 4.0–10.5)
nRBC: 0 % (ref 0.0–0.2)

## 2023-08-08 LAB — CMP (CANCER CENTER ONLY)
ALT: 8 U/L (ref 0–44)
AST: 18 U/L (ref 15–41)
Albumin: 3.9 g/dL (ref 3.5–5.0)
Alkaline Phosphatase: 32 U/L — ABNORMAL LOW (ref 38–126)
Anion gap: 8 (ref 5–15)
BUN: 23 mg/dL (ref 8–23)
CO2: 28 mmol/L (ref 22–32)
Calcium: 10.1 mg/dL (ref 8.9–10.3)
Chloride: 100 mmol/L (ref 98–111)
Creatinine: 1.49 mg/dL — ABNORMAL HIGH (ref 0.61–1.24)
GFR, Estimated: 48 mL/min — ABNORMAL LOW (ref >60)
Glucose, Bld: 82 mg/dL (ref 70–99)
Potassium: 4 mmol/L (ref 3.5–5.1)
Sodium: 136 mmol/L (ref 135–145)
Total Bilirubin: 0.5 mg/dL (ref 0.3–1.2)
Total Protein: 7.2 g/dL (ref 6.5–8.1)

## 2023-08-08 LAB — PROTIME-INR
INR: 1.7 — ABNORMAL HIGH (ref 0.8–1.2)
Prothrombin Time: 20.3 s — ABNORMAL HIGH (ref 11.4–15.2)

## 2023-08-08 MED ORDER — SODIUM CHLORIDE 0.9 % IV SOLN: Freq: Once | INTRAVENOUS | Status: AC

## 2023-08-08 MED ORDER — SODIUM CHLORIDE 0.9 % IV SOLN
50.0000 mg/m2 | Freq: Once | INTRAVENOUS | Status: AC
  Administered 2023-08-08: 100 mg via INTRAVENOUS
  Filled 2023-08-08: qty 4

## 2023-08-08 MED ORDER — PALONOSETRON HCL INJECTION 0.25 MG/5ML
0.2500 mg | Freq: Once | INTRAVENOUS | Status: AC
  Administered 2023-08-08: 0.25 mg via INTRAVENOUS
  Filled 2023-08-08: qty 5

## 2023-08-08 MED ORDER — INFLUENZA VAC A&B SURF ANT ADJ 0.5 ML IM SUSY
0.5000 mL | PREFILLED_SYRINGE | INTRAMUSCULAR | Status: AC
  Administered 2023-08-08: 0.5 mL via INTRAMUSCULAR
  Filled 2023-08-08: qty 0.5

## 2023-08-08 MED ORDER — SODIUM CHLORIDE 0.9 % IV SOLN
375.0000 mg/m2 | Freq: Once | INTRAVENOUS | Status: AC
  Administered 2023-08-08: 800 mg via INTRAVENOUS
  Filled 2023-08-08: qty 50

## 2023-08-08 MED ORDER — SODIUM CHLORIDE 0.9 % IV SOLN
10.0000 mg | Freq: Once | INTRAVENOUS | Status: AC
  Administered 2023-08-08: 10 mg via INTRAVENOUS
  Filled 2023-08-08: qty 10

## 2023-08-08 MED ORDER — ACETAMINOPHEN 325 MG PO TABS
650.0000 mg | ORAL_TABLET | Freq: Once | ORAL | Status: AC
  Administered 2023-08-08: 650 mg via ORAL
  Filled 2023-08-08: qty 2

## 2023-08-08 MED ORDER — DIPHENHYDRAMINE HCL 25 MG PO CAPS
50.0000 mg | ORAL_CAPSULE | Freq: Once | ORAL | Status: AC
  Administered 2023-08-08: 50 mg via ORAL
  Filled 2023-08-08: qty 2

## 2023-08-08 NOTE — Patient Instructions (Signed)
[facility]   Discharge Instructions: Thank you for choosing [facility] to provide your oncology and hematology care.   If you have a lab appointment with the Cancer Center, please go directly to the Cancer Center and check in at the registration area.   Wear comfortable clothing and clothing appropriate for easy access to any Portacath or PICC line.   We strive to give you quality time with your provider. You may need to reschedule your appointment if you arrive late (15 or more minutes).  Arriving late affects you and other patients whose appointments are after yours.  Also, if you miss three or more appointments without notifying the office, you may be dismissed from the clinic at the provider's discretion.      For prescription refill requests, have your pharmacy contact our office and allow 72 hours for refills to be completed.    Today you received the following chemotherapy and/or immunotherapy agents Rituxence/Bendeka      To help prevent nausea and vomiting after your treatment, we encourage you to take your nausea medication as directed.  BELOW ARE SYMPTOMS THAT SHOULD BE REPORTED IMMEDIATELY: *FEVER GREATER THAN 100.4 F (38 C) OR HIGHER *CHILLS OR SWEATING *NAUSEA AND VOMITING THAT IS NOT CONTROLLED WITH YOUR NAUSEA MEDICATION *UNUSUAL SHORTNESS OF BREATH *UNUSUAL BRUISING OR BLEEDING *URINARY PROBLEMS (pain or burning when urinating, or frequent urination) *BOWEL PROBLEMS (unusual diarrhea, constipation, pain near the anus) TENDERNESS IN MOUTH AND THROAT WITH OR WITHOUT PRESENCE OF ULCERS (sore throat, sores in mouth, or a toothache) UNUSUAL RASH, SWELLING OR PAIN  UNUSUAL VAGINAL DISCHARGE OR ITCHING   Items with * indicate a potential emergency and should be followed up as soon as possible or go to the Emergency Department if any problems should occur.  Please show the CHEMOTHERAPY ALERT CARD or IMMUNOTHERAPY ALERT CARD  at check-in to the Emergency Department and triage nurse.  Should you have questions after your visit or need to cancel or reschedule your appointment, please contact [facility]  Dept: [phone]  and follow the prompts.  Office hours are 8:00 a.m. to 4:30 p.m. Monday - Friday. Please note that voicemails left after 4:00 p.m. may not be returned until the following business day.  We are closed weekends and major holidays. You have access to a nurse at all times for urgent questions. Please call the main number to the clinic Dept: [phone] and follow the prompts.   For any non-urgent questions, you may also contact your provider using MyChart. We now offer e-Visits for anyone 61 and older to request care online for non-urgent symptoms. For details visit [email or web address].   Also download the MyChart app! Go to the app store, search "MyChart", open the app, select , and log in with your MyChart username and password.  Rituximab Injection What is this medication? RITUXIMAB (ri TUX i mab) treats leukemia and lymphoma. It works by blocking a protein that causes cancer cells to grow and multiply. This helps to slow or stop the spread of cancer cells. It may also be used to treat autoimmune conditions, such as arthritis. It works by slowing down an overactive immune system. It is a monoclonal antibody. This medicine may be used for other purposes; ask your health care provider or pharmacist if you have questions. COMMON BRAND NAME(S): RIABNI, Rituxan, RUXIENCE, truxima What should I tell my care team before I take this medication? They need to  know if you have any of these conditions: Chest pain Heart disease Immune system problems Infection, such as chickenpox, cold sores, hepatitis B, herpes Irregular heartbeat or rhythm Kidney disease Low blood counts, such as low white cells, platelets, red cells Lung disease Recent or upcoming  vaccine An unusual or allergic reaction to rituximab, other medications, foods, dyes, or preservatives Pregnant or trying to get pregnant Breast-feeding How should I use this medication? This medication is injected into a vein. It is given by a care team in a hospital or clinic setting. A special MedGuide will be given to you before each treatment. Be sure to read this information carefully each time. Talk to your care team about the use of this medication in children. While this medication may be prescribed for children as young as 6 months for selected conditions, precautions do apply. Overdosage: If you think you have taken too much of this medicine contact a poison control center or emergency room at once. NOTE: This medicine is only for you. Do not share this medicine with others. What if I miss a dose? Keep appointments for follow-up doses. It is important not to miss your dose. Call your care team if you are unable to keep an appointment. What may interact with this medication? Do not take this medication with any of the following: Live vaccines This medication may also interact with the following: Cisplatin This list may not describe all possible interactions. Give your health care provider a list of all the medicines, herbs, non-prescription drugs, or dietary supplements you use. Also tell them if you smoke, drink alcohol, or use illegal drugs. Some items may interact with your medicine. What should I watch for while using this medication? Your condition will be monitored carefully while you are receiving this medication. You may need blood work while taking this medication. This medication can cause serious infusion reactions. To reduce the risk your care team may give you other medications to take before receiving this one. Be sure to follow the directions from your care team. This medication may increase your risk of getting an infection. Call your care team for advice if you get a  fever, chills, sore throat, or other symptoms of a cold or flu. Do not treat yourself. Try to avoid being around people who are sick. Call your care team if you are around anyone with measles, chickenpox, or if you develop sores or blisters that do not heal properly. Avoid taking medications that contain aspirin, acetaminophen, ibuprofen, naproxen, or ketoprofen unless instructed by your care team. These medications may hide a fever. This medication may cause serious skin reactions. They can happen weeks to months after starting the medication. Contact your care team right away if you notice fevers or flu-like symptoms with a rash. The rash may be red or purple and then turn into blisters or peeling of the skin. You may also notice a red rash with swelling of the face, lips, or lymph nodes in your neck or under your arms. In some patients, this medication may cause a serious brain infection that may cause death. If you have any problems seeing, thinking, speaking, walking, or standing, tell your care team right away. If you cannot reach your care team, urgently seek another source of medical care. Talk to your care team if you may be pregnant. Serious birth defects can occur if you take this medication during pregnancy and for 12 months after the last dose. You will need a negative pregnancy test  before starting this medication. Contraception is recommended while taking this medication and for 12 months after the last dose. Your care team can help you find the option that works for you. Do not breastfeed while taking this medication and for at least 6 months after the last dose. What side effects may I notice from receiving this medication? Side effects that you should report to your care team as soon as possible: Allergic reactions or angioedema--skin rash, itching or hives, swelling of the face, eyes, lips, tongue, arms, or legs, trouble swallowing or breathing Bowel blockage--stomach cramping, unable to  have a bowel movement or pass gas, loss of appetite, vomiting Dizziness, loss of balance or coordination, confusion or trouble speaking Heart attack--pain or tightness in the chest, shoulders, arms, or jaw, nausea, shortness of breath, cold or clammy skin, feeling faint or lightheaded Heart rhythm changes--fast or irregular heartbeat, dizziness, feeling faint or lightheaded, chest pain, trouble breathing Infection--fever, chills, cough, sore throat, wounds that don't heal, pain or trouble when passing urine, general feeling of discomfort or being unwell Infusion reactions--chest pain, shortness of breath or trouble breathing, feeling faint or lightheaded Kidney injury--decrease in the amount of urine, swelling of the ankles, hands, or feet Liver injury--right upper belly pain, loss of appetite, nausea, light-colored stool, dark yellow or brown urine, yellowing skin or eyes, unusual weakness or fatigue Redness, blistering, peeling, or loosening of the skin, including inside the mouth Stomach pain that is severe, does not go away, or gets worse Tumor lysis syndrome (TLS)--nausea, vomiting, diarrhea, decrease in the amount of urine, dark urine, unusual weakness or fatigue, confusion, muscle pain or cramps, fast or irregular heartbeat, joint pain Side effects that usually do not require medical attention (report to your care team if they continue or are bothersome): Headache Joint pain Nausea Runny or stuffy nose Unusual weakness or fatigue This list may not describe all possible side effects. Call your doctor for medical advice about side effects. You may report side effects to FDA at 1-800-FDA-1088. Where should I keep my medication? This medication is given in a hospital or clinic. It will not be stored at home. NOTE: This sheet is a summary. It may not cover all possible information. If you have questions about this medicine, talk to your doctor, pharmacist, or health care provider.  2024  Elsevier/Gold Standard (2022-04-08 00:00:00) Bendamustine Injection What is this medication? BENDAMUSTINE (BEN da MUS teen) treats leukemia and lymphoma. It works by slowing down the growth of cancer cells. This medicine may be used for other purposes; ask your health care provider or pharmacist if you have questions. COMMON BRAND NAME(S): Marilynne Halsted, VIVIMUSTA What should I tell my care team before I take this medication? They need to know if you have any of these conditions: Infection, especially a viral infection, such as chickenpox, cold sores, herpes Kidney disease Liver disease An unusual or allergic reaction to bendamustine, mannitol, other medications, foods, dyes, or preservatives Pregnant or trying to get pregnant Breast-feeding How should I use this medication? This medication is injected into a vein. It is given by your care team in a hospital or clinic setting. Talk to your care team about the use of this medication in children. Special care may be needed. Overdosage: If you think you have taken too much of this medicine contact a poison control center or emergency room at once. NOTE: This medicine is only for you. Do not share this medicine with others. What if I miss a dose? Keep  appointments for follow-up doses. It is important not to miss your dose. Call your care team if you are unable to keep an appointment. What may interact with this medication? Do not take this medication with any of the following: Clozapine This medication may also interact with the following: Atazanavir Cimetidine Ciprofloxacin Enoxacin Fluvoxamine Medications for seizures, such as carbamazepine, phenobarbital Mexiletine Rifampin Tacrine Thiabendazole Zileuton This list may not describe all possible interactions. Give your health care provider a list of all the medicines, herbs, non-prescription drugs, or dietary supplements you use. Also tell them if you smoke, drink  alcohol, or use illegal drugs. Some items may interact with your medicine. What should I watch for while using this medication? Visit your care team for regular checks on your progress. This medication may make you feel generally unwell. This is not uncommon, as chemotherapy can affect healthy cells as well as cancer cells. Report any side effects. Continue your course of treatment even though you feel ill unless your care team tells you to stop. You may need blood work while taking this medication. This medication may increase your risk of getting an infection. Call your care team for advice if you get a fever, chills, sore throat, or other symptoms of a cold or flu. Do not treat yourself. Try to avoid being around people who are sick. This medication may cause serious skin reactions. They can happen weeks to months after starting the medication. Contact your care team right away if you notice fevers or flu-like symptoms with a rash. The rash may be red or purple and then turn into blisters or peeling of the skin. You may also notice a red rash with swelling of the face, lips, or lymph nodes in your neck or under your arms. In some patients, this medication may cause a serious brain infection that may cause death. If you have any problems seeing, thinking, speaking, walking, or standing, tell your care team right away. If you cannot reach your care team, urgently seek other source of medical care. This medication may increase your risk to bruise or bleed. Call your care team if you notice any unusual bleeding. Talk to your care team about your risk of cancer. You may be more at risk for certain types of cancer if you take this medication. Talk to your care team about your risk of skin cancer. You may be more at risk for skin cancer if you take this medication. Talk to your care team if you or your partner wish to become pregnant or think either of you might be pregnant. This medication can cause serious  birth defects if taken during pregnancy or for up to 6 months after the last dose. A negative pregnancy test is required before starting this medication. A reliable form of contraception is recommended while taking this medication and for 6 months after the last dose. Talk to your care team about reliable forms of contraception. Wear a condom while taking this medication and for at least 3 months after the last dose. Do not breast-feed while taking this medication or for at least 1 week after the last dose. This medication may cause infertility. Talk to your care team if you are concerned about your fertility. What side effects may I notice from receiving this medication? Side effects that you should report to your care team as soon as possible: Allergic reactions--skin rash, itching, hives, swelling of the face, lips, tongue, or throat Infection--fever, chills, cough, sore throat, wounds that don't  heal, pain or trouble when passing urine, general feeling of discomfort or being unwell Infusion reactions--chest pain, shortness of breath or trouble breathing, feeling faint or lightheaded Liver injury--right upper belly pain, loss of appetite, nausea, light-colored stool, dark yellow or brown urine, yellowing skin or eyes, unusual weakness or fatigue Low red blood cell level--unusual weakness or fatigue, dizziness, headache, trouble breathing Painful swelling, warmth, or redness of the skin, blisters or sores at the infusion site Rash, fever, and swollen lymph nodes Redness, blistering, peeling, or loosening of the skin, including inside the mouth Tumor lysis syndrome (TLS)--nausea, vomiting, diarrhea, decrease in the amount of urine, dark urine, unusual weakness or fatigue, confusion, muscle pain or cramps, fast or irregular heartbeat, joint pain Unusual bruising or bleeding Side effects that usually do not require medical attention (report to your care team if they continue or are  bothersome): Diarrhea Fatigue Headache Loss of appetite Nausea Vomiting This list may not describe all possible side effects. Call your doctor for medical advice about side effects. You may report side effects to FDA at 1-800-FDA-1088. Where should I keep my medication? This medication is given in a hospital or clinic. It will not be stored at home. NOTE: This sheet is a summary. It may not cover all possible information. If you have questions about this medicine, talk to your doctor, pharmacist, or health care provider.  2024 Elsevier/Gold Standard (2022-03-09 00:00:00)

## 2023-08-08 NOTE — Progress Notes (Signed)
Patient seen by Dr. Truett Perna today  Vitals are within treatment parameters.  Labs reviewed by Dr. Truett Perna and are within treatment parameters.  Per physician team, patient is ready for treatment and there are NO modifications to the treatment plan. Patient also requesting flu vaccine today (order is in).

## 2023-08-08 NOTE — Progress Notes (Signed)
[facility] OFFICE PROGRESS NOTE   Diagnosis: CLL  INTERVAL HISTORY:   Mr Kusner pleated cycle 1 Bendamustine/rituximab beginning 07/07/2023.  No nausea or rash.  He reports improvement in his energy level.  No change in the abdominal mass.  His appetite continues to be diminished.  He underwent biopsies of the face skin lesions last week.  The upper respiratory symptoms have resolved.   Objective:  Vital signs in last 24 hours:  Blood pressure 139/70, pulse 79, temperature 98.2 F (36.8 C), temperature source Oral, resp. rate 18, height 5\' 11"  (1.803 m), weight 187 lb 9.6 oz (85.1 kg), SpO2 100%.    HEENT: No thrush or ulcers Lymphatics: No cervical, supraclavicular, left axillary, or inguinal nodes.  1 cm soft mobile right axillary node Resp: Lungs clear bilaterally Cardio: Regular rate and rhythm GI: Masslike fullness throughout the mid abdomen on the left greater than right, no hepatosplenomegaly Vascular: No leg edema  Skin: Biopsy sites with sutures in place over the face  Portacath/PICC-without erythema  Lab Results:  Lab Results  Component Value Date   WBC 5.5 08/08/2023   HGB 11.1 (L) 08/08/2023   HCT 32.8 (L) 08/08/2023   MCV 94.8 08/08/2023   PLT 132 (L) 08/08/2023   NEUTROABS 3.1 08/08/2023    CMP  Lab Results  Component Value Date   NA 136 08/08/2023   K 4.0 08/08/2023   CL 100 08/08/2023   CO2 28 08/08/2023   GLUCOSE 82 08/08/2023   BUN 23 08/08/2023   CREATININE 1.49 (H) 08/08/2023   CALCIUM 10.1 08/08/2023   PROT 7.2 08/08/2023   ALBUMIN 3.9 08/08/2023   AST 18 08/08/2023   ALT 8 08/08/2023   ALKPHOS 32 (L) 08/08/2023   BILITOT 0.5 08/08/2023   GFRNONAA 48 (L) 08/08/2023   GFRAA 52 (L) 08/13/2020    Medications: I have reviewed the patient's current medications.   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 He will return for a monthly PT check.   Indwelling IVC catheter.  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        4. 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 erectile dysfunction-followed by Dr. Annabell Howells , status post placement of a penile implant at [facility] in April 2014            7. CT of the abdomen/pelvis at [facility] urology February 2014 for evaluation of hematuria-12 mm external iliac nodes and left periaortic retroperitoneal lymph node-11 mm-potentially related to CLL      8.  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, peripheral lymphocytosis and small palpable lymph nodes          CT abdomen/pelvis 09/18/2019-extensive abdominal pelvic lymphadenopathy including a left periaortic node measuring 5.7 cm 08/13/2020 CLL FISH panel-no evidence of trisomy 12; no evidence of [id number]; no evidence of p53 deletion or amplification; deletion of ATM present 11/05/2020-peripheral blood TP53 mutation-negative Cycle 1 bendamustine/Rituxan 10/08/2020, 10/09/2020 Cycle 2 bendamustine/Rituxan 11/05/2020, 11/06/2020 Cycle 3 bendamustine/Rituxan 12/03/2020, 12/04/2020 Cycle 4 bendamustine/Rituxan 12/31/2020, 01/01/2021 CTs 01/19/2021-decrease in bulky abdomen/pelvic lymphadenopathy with more widespread ill-defined "haziness "in the mesentery and retroperitoneal fat, decreased pelvic lymphadenopathy Cycle 5 bendamustine/Rituxan 01/28/2021, 01/29/2021 Cycle 6 bendamustine/rituximab 02/25/2021, 02/26/2021 CT 02/22/2023-mild generalized lymphadenopathy in the neck, significant increase in chest andabdominal/pelvic lymph nodes, dominant nodal mass in the mesentery CT 06/25/2023-progressive lymphadenopathy in the  abdomen and pelvis, dominant confluent mass in the upper abdomen/retroperitoneum has enlarged, stable small nodes in the chest Cycle 1 Bendamustine/Rituxan 07/07/2023, 07/08/2023 Cycle 2 Bendamustine/Rituxan 08/08/2023, 08/09/2023     9.  Basal cell  carcinoma removed from the left superior central forehead 01/21/2020    10.  Renal insufficiency    11.  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  Indeterminate left hepatic lobe lesion on CT 02/22/2023 13.  Fever and altered mental status following cycle 2 Cemiplimab-no source for infection identified     Disposition: Mr Brintnall tolerated the first cycle of Bendamustine/rituximab well.  Lymphocytosis, thrombocytopenia, and his energy level have improved.  He will complete cycle 2 beginning today.  Undergo restaging CT evaluation after this cycle.  He ports he is scheduled for follow-up by telephone with Dr. Tama Gander next week to review the skin biopsy pathology.  He will continue Coumadin anticoagulation at the current dose.  He received an influenza vaccine today.  Mr. Rostkowski will return for an office visit after the restaging CTs next month.    [name], MD  08/08/2023  10:05 AM

## 2023-08-09 ENCOUNTER — Inpatient Hospital Stay: Payer: MEDICARE

## 2023-08-09 ENCOUNTER — Encounter: Payer: Self-pay | Admitting: Oncology

## 2023-08-09 VITALS — BP 109/57 | HR 67 | Temp 98.1°F | Resp 18

## 2023-08-09 DIAGNOSIS — C9110 Chronic lymphocytic leukemia of B-cell type not having achieved remission: Secondary | ICD-10-CM

## 2023-08-09 DIAGNOSIS — Z5111 Encounter for antineoplastic chemotherapy: Secondary | ICD-10-CM | POA: Diagnosis not present

## 2023-08-09 MED ORDER — SODIUM CHLORIDE 0.9 % IV SOLN: Freq: Once | INTRAVENOUS | Status: AC

## 2023-08-09 MED ORDER — SODIUM CHLORIDE 0.9 % IV SOLN
10.0000 mg | Freq: Once | INTRAVENOUS | Status: AC
  Administered 2023-08-09: 10 mg via INTRAVENOUS
  Filled 2023-08-09: qty 10

## 2023-08-09 MED ORDER — SODIUM CHLORIDE 0.9 % IV SOLN
50.0000 mg/m2 | Freq: Once | INTRAVENOUS | Status: AC
  Administered 2023-08-09: 100 mg via INTRAVENOUS
  Filled 2023-08-09: qty 4

## 2023-08-09 NOTE — Progress Notes (Signed)
Patient stated he had send MyChart message requesting to have sutures removed.  Patient stated his sutures were placed on Thursday 08/04/23.  Informed patient this is too soon for sutures to be removed, especially since he has a history of bleeding easily.  Instructed patient to wait at least 2 weeks before sutures are removed.  Patient verbalized understanding.

## 2023-08-09 NOTE — Patient Instructions (Signed)
[facility]  Discharge Instructions: Thank you for choosing [facility] to provide your oncology and hematology care.   If you have a lab appointment with the Cancer Center, please go directly to the Cancer Center and check in at the registration area.   Wear comfortable clothing and clothing appropriate for easy access to any Portacath or PICC line.   We strive to give you quality time with your provider. You may need to reschedule your appointment if you arrive late (15 or more minutes).  Arriving late affects you and other patients whose appointments are after yours.  Also, if you miss three or more appointments without notifying the office, you may be dismissed from the clinic at the provider's discretion.      For prescription refill requests, have your pharmacy contact our office and allow 72 hours for refills to be completed.    Today you received the following chemotherapy and/or immunotherapy agents: bendamustine      To help prevent nausea and vomiting after your treatment, we encourage you to take your nausea medication as directed.  BELOW ARE SYMPTOMS THAT SHOULD BE REPORTED IMMEDIATELY: *FEVER GREATER THAN 100.4 F (38 C) OR HIGHER *CHILLS OR SWEATING *NAUSEA AND VOMITING THAT IS NOT CONTROLLED WITH YOUR NAUSEA MEDICATION *UNUSUAL SHORTNESS OF BREATH *UNUSUAL BRUISING OR BLEEDING *URINARY PROBLEMS (pain or burning when urinating, or frequent urination) *BOWEL PROBLEMS (unusual diarrhea, constipation, pain near the anus) TENDERNESS IN MOUTH AND THROAT WITH OR WITHOUT PRESENCE OF ULCERS (sore throat, sores in mouth, or a toothache) UNUSUAL RASH, SWELLING OR PAIN  UNUSUAL VAGINAL DISCHARGE OR ITCHING   Items with * indicate a potential emergency and should be followed up as soon as possible or go to the Emergency Department if any problems should occur.  Please show the CHEMOTHERAPY ALERT CARD or IMMUNOTHERAPY ALERT CARD at  check-in to the Emergency Department and triage nurse.  Should you have questions after your visit or need to cancel or reschedule your appointment, please contact [facility]  Dept: [phone]  and follow the prompts.  Office hours are 8:00 a.m. to 4:30 p.m. Monday - Friday. Please note that voicemails left after 4:00 p.m. may not be returned until the following business day.  We are closed weekends and major holidays. You have access to a nurse at all times for urgent questions. Please call the main number to the clinic Dept: [phone] and follow the prompts.   For any non-urgent questions, you may also contact your provider using MyChart. We now offer e-Visits for anyone 18 and older to request care online for non-urgent symptoms. For details visit [email or web address].   Also download the MyChart app! Go to the app store, search "MyChart", open the app, select [place] [place], and log in with your MyChart username and password.  Bendamustine Injection What is this medication? BENDAMUSTINE (BEN da MUS teen) treats leukemia and lymphoma. It works by slowing down the growth of cancer cells. This medicine may be used for other purposes; ask your health care provider or pharmacist if you have questions. COMMON BRAND NAME(S): BELRAPZO, BENDEKA, Treanda, VIVIMUSTA What should I tell my care team before I take this medication? They need to know if you have any of these conditions: Infection, especially a viral infection, such as chickenpox, cold sores, herpes Kidney disease Liver disease An unusual or allergic reaction to bendamustine, mannitol, other medications, foods, dyes, or preservatives Pregnant or trying to get   pregnant Breast-feeding How should I use this medication? This medication is injected into a vein. It is given by your care team in a hospital or clinic setting. Talk to your care team about the use of this medication in children. Special care  may be needed. Overdosage: If you think you have taken too much of this medicine contact a poison control center or emergency room at once. NOTE: This medicine is only for you. Do not share this medicine with others. What if I miss a dose? Keep appointments for follow-up doses. It is important not to miss your dose. Call your care team if you are unable to keep an appointment. What may interact with this medication? Do not take this medication with any of the following: Clozapine This medication may also interact with the following: Atazanavir Cimetidine Ciprofloxacin Enoxacin Fluvoxamine Medications for seizures, such as carbamazepine, phenobarbital Mexiletine Rifampin Tacrine Thiabendazole Zileuton This list may not describe all possible interactions. Give your health care provider a list of all the medicines, herbs, non-prescription drugs, or dietary supplements you use. Also tell them if you smoke, drink alcohol, or use illegal drugs. Some items may interact with your medicine. What should I watch for while using this medication? Visit your care team for regular checks on your progress. This medication may make you feel generally unwell. This is not uncommon, as chemotherapy can affect healthy cells as well as cancer cells. Report any side effects. Continue your course of treatment even though you feel ill unless your care team tells you to stop. You may need blood work while taking this medication. This medication may increase your risk of getting an infection. Call your care team for advice if you get a fever, chills, sore throat, or other symptoms of a cold or flu. Do not treat yourself. Try to avoid being around people who are sick. This medication may cause serious skin reactions. They can happen weeks to months after starting the medication. Contact your care team right away if you notice fevers or flu-like symptoms with a rash. The rash may be red or purple and then turn into  blisters or peeling of the skin. You may also notice a red rash with swelling of the face, lips, or lymph nodes in your neck or under your arms. In some patients, this medication may cause a serious brain infection that may cause death. If you have any problems seeing, thinking, speaking, walking, or standing, tell your care team right away. If you cannot reach your care team, urgently seek other source of medical care. This medication may increase your risk to bruise or bleed. Call your care team if you notice any unusual bleeding. Talk to your care team about your risk of cancer. You may be more at risk for certain types of cancer if you take this medication. Talk to your care team about your risk of skin cancer. You may be more at risk for skin cancer if you take this medication. Talk to your care team if you or your partner wish to become pregnant or think either of you might be pregnant. This medication can cause serious birth defects if taken during pregnancy or for up to 6 months after the last dose. A negative pregnancy test is required before starting this medication. A reliable form of contraception is recommended while taking this medication and for 6 months after the last dose. Talk to your care team about reliable forms of contraception. Wear a condom while taking this medication   and for at least 3 months after the last dose. Do not breast-feed while taking this medication or for at least 1 week after the last dose. This medication may cause infertility. Talk to your care team if you are concerned about your fertility. What side effects may I notice from receiving this medication? Side effects that you should report to your care team as soon as possible: Allergic reactions--skin rash, itching, hives, swelling of the face, lips, tongue, or throat Infection--fever, chills, cough, sore throat, wounds that don't heal, pain or trouble when passing urine, general feeling of discomfort or being  unwell Infusion reactions--chest pain, shortness of breath or trouble breathing, feeling faint or lightheaded Liver injury--right upper belly pain, loss of appetite, nausea, light-colored stool, dark yellow or brown urine, yellowing skin or eyes, unusual weakness or fatigue Low red blood cell level--unusual weakness or fatigue, dizziness, headache, trouble breathing Painful swelling, warmth, or redness of the skin, blisters or sores at the infusion site Rash, fever, and swollen lymph nodes Redness, blistering, peeling, or loosening of the skin, including inside the mouth Tumor lysis syndrome (TLS)--nausea, vomiting, diarrhea, decrease in the amount of urine, dark urine, unusual weakness or fatigue, confusion, muscle pain or cramps, fast or irregular heartbeat, joint pain Unusual bruising or bleeding Side effects that usually do not require medical attention (report to your care team if they continue or are bothersome): Diarrhea Fatigue Headache Loss of appetite Nausea Vomiting This list may not describe all possible side effects. Call your doctor for medical advice about side effects. You may report side effects to FDA at 1-800-FDA-1088. Where should I keep my medication? This medication is given in a hospital or clinic. It will not be stored at home. NOTE: This sheet is a summary. It may not cover all possible information. If you have questions about this medicine, talk to your doctor, pharmacist, or health care provider.  2024 Elsevier/Gold Standard (2022-03-09 00:00:00)   

## 2023-08-10 ENCOUNTER — Encounter: Payer: Self-pay | Admitting: Oncology

## 2023-08-29 ENCOUNTER — Telehealth: Payer: Self-pay | Admitting: *Deleted

## 2023-08-29 NOTE — Telephone Encounter (Signed)
Chad Gross called to clarify his CT prep. He was told no contrast and radiology has him coming in to drink oral contrast for scan. Informed him that he will not have IV contrast, but Chad Gross thinks drinking the oral contrast will help him follow abdominal nodes better. He understands and agrees.

## 2023-08-30 ENCOUNTER — Encounter (HOSPITAL_BASED_OUTPATIENT_CLINIC_OR_DEPARTMENT_OTHER): Payer: Self-pay

## 2023-08-30 ENCOUNTER — Other Ambulatory Visit (HOSPITAL_BASED_OUTPATIENT_CLINIC_OR_DEPARTMENT_OTHER): Payer: Self-pay

## 2023-08-30 ENCOUNTER — Ambulatory Visit (HOSPITAL_BASED_OUTPATIENT_CLINIC_OR_DEPARTMENT_OTHER)
Admission: RE | Admit: 2023-08-30 | Discharge: 2023-08-30 | Disposition: A | Discharge status: Home / Self Care | Payer: MEDICARE | Source: Ambulatory Visit | Attending: Oncology | Admitting: Oncology

## 2023-08-30 DIAGNOSIS — C9110 Chronic lymphocytic leukemia of B-cell type not having achieved remission: Secondary | ICD-10-CM | POA: Insufficient documentation

## 2023-08-30 MED ORDER — COVID-19 MRNA VAC-TRIS(PFIZER) 30 MCG/0.3ML IM SUSY
0.3000 mL | PREFILLED_SYRINGE | Freq: Once | INTRAMUSCULAR | 0 refills | Status: AC
  Filled 2023-08-30: qty 0.3, 1d supply, fill #0

## 2023-09-02 ENCOUNTER — Other Ambulatory Visit: Payer: Self-pay | Admitting: Oncology

## 2023-09-05 ENCOUNTER — Inpatient Hospital Stay: Payer: MEDICARE

## 2023-09-05 ENCOUNTER — Encounter: Payer: Self-pay | Admitting: Nurse Practitioner

## 2023-09-05 ENCOUNTER — Other Ambulatory Visit: Payer: MEDICARE

## 2023-09-05 ENCOUNTER — Inpatient Hospital Stay: Payer: MEDICARE | Attending: Oncology | Admitting: Nurse Practitioner

## 2023-09-05 VITALS — BP 121/80 | HR 77 | Temp 98.1°F | Resp 18 | Ht 71.0 in | Wt 185.0 lb

## 2023-09-05 VITALS — BP 101/63 | HR 70 | Temp 98.1°F | Resp 18

## 2023-09-05 DIAGNOSIS — Z5111 Encounter for antineoplastic chemotherapy: Secondary | ICD-10-CM | POA: Insufficient documentation

## 2023-09-05 DIAGNOSIS — Z5112 Encounter for antineoplastic immunotherapy: Secondary | ICD-10-CM | POA: Insufficient documentation

## 2023-09-05 DIAGNOSIS — C9110 Chronic lymphocytic leukemia of B-cell type not having achieved remission: Secondary | ICD-10-CM

## 2023-09-05 DIAGNOSIS — Z7901 Long term (current) use of anticoagulants: Secondary | ICD-10-CM

## 2023-09-05 LAB — CBC WITH DIFFERENTIAL (CANCER CENTER ONLY)
Abs Immature Granulocytes: 0.03 10*3/uL (ref 0.00–0.07)
Basophils Absolute: 0 10*3/uL (ref 0.0–0.1)
Basophils Relative: 1 %
Eosinophils Absolute: 0 10*3/uL (ref 0.0–0.5)
Eosinophils Relative: 0 %
HCT: 34.7 % — ABNORMAL LOW (ref 39.0–52.0)
Hemoglobin: 12 g/dL — ABNORMAL LOW (ref 13.0–17.0)
Immature Granulocytes: 1 %
Lymphocytes Relative: 20 %
Lymphs Abs: 0.8 10*3/uL (ref 0.7–4.0)
MCH: 32.4 pg (ref 26.0–34.0)
MCHC: 34.6 g/dL (ref 30.0–36.0)
MCV: 93.8 fL (ref 80.0–100.0)
Monocytes Absolute: 0.4 10*3/uL (ref 0.1–1.0)
Monocytes Relative: 11 %
Neutro Abs: 2.7 10*3/uL (ref 1.7–7.7)
Neutrophils Relative %: 67 %
Platelet Count: 105 10*3/uL — ABNORMAL LOW (ref 150–400)
RBC: 3.7 MIL/uL — ABNORMAL LOW (ref 4.22–5.81)
RDW: 14 % (ref 11.5–15.5)
WBC Count: 4 10*3/uL (ref 4.0–10.5)
nRBC: 0 % (ref 0.0–0.2)

## 2023-09-05 LAB — CMP (CANCER CENTER ONLY)
ALT: 8 U/L (ref 0–44)
AST: 15 U/L (ref 15–41)
Albumin: 4 g/dL (ref 3.5–5.0)
Alkaline Phosphatase: 41 U/L (ref 38–126)
Anion gap: 7 (ref 5–15)
BUN: 18 mg/dL (ref 8–23)
CO2: 29 mmol/L (ref 22–32)
Calcium: 9.5 mg/dL (ref 8.9–10.3)
Chloride: 101 mmol/L (ref 98–111)
Creatinine: 1.33 mg/dL — ABNORMAL HIGH (ref 0.61–1.24)
GFR, Estimated: 55 mL/min — ABNORMAL LOW (ref >60)
Glucose, Bld: 92 mg/dL (ref 70–99)
Potassium: 3.7 mmol/L (ref 3.5–5.1)
Sodium: 137 mmol/L (ref 135–145)
Total Bilirubin: 0.6 mg/dL (ref 0.3–1.2)
Total Protein: 6.9 g/dL (ref 6.5–8.1)

## 2023-09-05 LAB — PROTIME-INR
INR: 2.1 — ABNORMAL HIGH (ref 0.8–1.2)
Prothrombin Time: 23.6 s — ABNORMAL HIGH (ref 11.4–15.2)

## 2023-09-05 MED ORDER — SODIUM CHLORIDE 0.9 % IV SOLN
50.0000 mg/m2 | Freq: Once | INTRAVENOUS | Status: AC
  Administered 2023-09-05: 100 mg via INTRAVENOUS
  Filled 2023-09-05: qty 4

## 2023-09-05 MED ORDER — DIPHENHYDRAMINE HCL 25 MG PO CAPS
50.0000 mg | ORAL_CAPSULE | Freq: Once | ORAL | Status: AC
  Administered 2023-09-05: 50 mg via ORAL
  Filled 2023-09-05: qty 2

## 2023-09-05 MED ORDER — ACETAMINOPHEN 325 MG PO TABS
650.0000 mg | ORAL_TABLET | Freq: Once | ORAL | Status: AC
  Administered 2023-09-05: 650 mg via ORAL
  Filled 2023-09-05: qty 2

## 2023-09-05 MED ORDER — SODIUM CHLORIDE 0.9 % IV SOLN: Freq: Once | INTRAVENOUS | Status: AC

## 2023-09-05 MED ORDER — SODIUM CHLORIDE 0.9 % IV SOLN
10.0000 mg | Freq: Once | INTRAVENOUS | Status: AC
  Administered 2023-09-05: 10 mg via INTRAVENOUS
  Filled 2023-09-05: qty 10

## 2023-09-05 MED ORDER — SODIUM CHLORIDE 0.9 % IV SOLN
375.0000 mg/m2 | Freq: Once | INTRAVENOUS | Status: AC
  Administered 2023-09-05: 800 mg via INTRAVENOUS
  Filled 2023-09-05: qty 50

## 2023-09-05 MED ORDER — PALONOSETRON HCL INJECTION 0.25 MG/5ML
0.2500 mg | Freq: Once | INTRAVENOUS | Status: AC
  Administered 2023-09-05: 0.25 mg via INTRAVENOUS
  Filled 2023-09-05: qty 5

## 2023-09-05 NOTE — Patient Instructions (Signed)
[place] [facility]   Discharge Instructions: Thank you for choosing [facility] to provide your oncology and hematology care.   If you have a lab appointment with the Cancer Center, please go directly to the Cancer Center and check in at the registration area.   Wear comfortable clothing and clothing appropriate for easy access to any Portacath or PICC line.   We strive to give you quality time with your provider. You may need to reschedule your appointment if you arrive late (15 or more minutes).  Arriving late affects you and other patients whose appointments are after yours.  Also, if you miss three or more appointments without notifying the office, you may be dismissed from the clinic at the provider's discretion.      For prescription refill requests, have your pharmacy contact our office and allow 72 hours for refills to be completed.    Today you received the following chemotherapy and/or immunotherapy agents Rituximab-pvvr (RUXIENCE) & Bendamustine (BENDEKA).      To help prevent nausea and vomiting after your treatment, we encourage you to take your nausea medication as directed.  BELOW ARE SYMPTOMS THAT SHOULD BE REPORTED IMMEDIATELY: *FEVER GREATER THAN 100.4 F (38 C) OR HIGHER *CHILLS OR SWEATING *NAUSEA AND VOMITING THAT IS NOT CONTROLLED WITH YOUR NAUSEA MEDICATION *UNUSUAL SHORTNESS OF BREATH *UNUSUAL BRUISING OR BLEEDING *URINARY PROBLEMS (pain or burning when urinating, or frequent urination) *BOWEL PROBLEMS (unusual diarrhea, constipation, pain near the anus) TENDERNESS IN MOUTH AND THROAT WITH OR WITHOUT PRESENCE OF ULCERS (sore throat, sores in mouth, or a toothache) UNUSUAL RASH, SWELLING OR PAIN  UNUSUAL VAGINAL DISCHARGE OR ITCHING   Items with * indicate a potential emergency and should be followed up as soon as possible or go to the Emergency Department if any problems should occur.  Please show the CHEMOTHERAPY ALERT  CARD or IMMUNOTHERAPY ALERT CARD at check-in to the Emergency Department and triage nurse.  Should you have questions after your visit or need to cancel or reschedule your appointment, please contact [place] [facility]  Dept: [phone]  and follow the prompts.  Office hours are 8:00 a.m. to 4:30 p.m. Monday - Friday. Please note that voicemails left after 4:00 p.m. may not be returned until the following business day.  We are closed weekends and major holidays. You have access to a nurse at all times for urgent questions. Please call the main number to the clinic Dept: [phone] and follow the prompts.   For any non-urgent questions, you may also contact your provider using MyChart. We now offer e-Visits for anyone 45 and older to request care online for non-urgent symptoms. For details visit [email or web address].   Also download the MyChart app! Go to the app store, search "MyChart", open the app, select [place], and log in with your MyChart username and password.  Rituximab Injection What is this medication? RITUXIMAB (ri TUX i mab) treats leukemia and lymphoma. It works by blocking a protein that causes cancer cells to grow and multiply. This helps to slow or stop the spread of cancer cells. It may also be used to treat autoimmune conditions, such as arthritis. It works by slowing down an overactive immune system. It is a monoclonal antibody. This medicine may be used for other purposes; ask your health care provider or pharmacist if you have questions. COMMON BRAND NAME(S): RIABNI, Rituxan, RUXIENCE, truxima What should I tell my care team before I take this  medication? They need to know if you have any of these conditions: Chest pain Heart disease Immune system problems Infection, such as chickenpox, cold sores, hepatitis B, herpes Irregular heartbeat or rhythm Kidney disease Low blood counts, such as low white cells, platelets, red cells Lung  disease Recent or upcoming vaccine An unusual or allergic reaction to rituximab, other medications, foods, dyes, or preservatives Pregnant or trying to get pregnant Breast-feeding How should I use this medication? This medication is injected into a vein. It is given by a care team in a hospital or clinic setting. A special MedGuide will be given to you before each treatment. Be sure to read this information carefully each time. Talk to your care team about the use of this medication in children. While this medication may be prescribed for children as young as 6 months for selected conditions, precautions do apply. Overdosage: If you think you have taken too much of this medicine contact a poison control center or emergency room at once. NOTE: This medicine is only for you. Do not share this medicine with others. What if I miss a dose? Keep appointments for follow-up doses. It is important not to miss your dose. Call your care team if you are unable to keep an appointment. What may interact with this medication? Do not take this medication with any of the following: Live vaccines This medication may also interact with the following: Cisplatin This list may not describe all possible interactions. Give your health care provider a list of all the medicines, herbs, non-prescription drugs, or dietary supplements you use. Also tell them if you smoke, drink alcohol, or use illegal drugs. Some items may interact with your medicine. What should I watch for while using this medication? Your condition will be monitored carefully while you are receiving this medication. You may need blood work while taking this medication. This medication can cause serious infusion reactions. To reduce the risk your care team may give you other medications to take before receiving this one. Be sure to follow the directions from your care team. This medication may increase your risk of getting an infection. Call your care  team for advice if you get a fever, chills, sore throat, or other symptoms of a cold or flu. Do not treat yourself. Try to avoid being around people who are sick. Call your care team if you are around anyone with measles, chickenpox, or if you develop sores or blisters that do not heal properly. Avoid taking medications that contain aspirin, acetaminophen, ibuprofen, naproxen, or ketoprofen unless instructed by your care team. These medications may hide a fever. This medication may cause serious skin reactions. They can happen weeks to months after starting the medication. Contact your care team right away if you notice fevers or flu-like symptoms with a rash. The rash may be red or purple and then turn into blisters or peeling of the skin. You may also notice a red rash with swelling of the face, lips, or lymph nodes in your neck or under your arms. In some patients, this medication may cause a serious brain infection that may cause death. If you have any problems seeing, thinking, speaking, walking, or standing, tell your care team right away. If you cannot reach your care team, urgently seek another source of medical care. Talk to your care team if you may be pregnant. Serious birth defects can occur if you take this medication during pregnancy and for 12 months after the last dose. You will need  a negative pregnancy test before starting this medication. Contraception is recommended while taking this medication and for 12 months after the last dose. Your care team can help you find the option that works for you. Do not breastfeed while taking this medication and for at least 6 months after the last dose. What side effects may I notice from receiving this medication? Side effects that you should report to your care team as soon as possible: Allergic reactions or angioedema--skin rash, itching or hives, swelling of the face, eyes, lips, tongue, arms, or legs, trouble swallowing or breathing Bowel  blockage--stomach cramping, unable to have a bowel movement or pass gas, loss of appetite, vomiting Dizziness, loss of balance or coordination, confusion or trouble speaking Heart attack--pain or tightness in the chest, shoulders, arms, or jaw, nausea, shortness of breath, cold or clammy skin, feeling faint or lightheaded Heart rhythm changes--fast or irregular heartbeat, dizziness, feeling faint or lightheaded, chest pain, trouble breathing Infection--fever, chills, cough, sore throat, wounds that don't heal, pain or trouble when passing urine, general feeling of discomfort or being unwell Infusion reactions--chest pain, shortness of breath or trouble breathing, feeling faint or lightheaded Kidney injury--decrease in the amount of urine, swelling of the ankles, hands, or feet Liver injury--right upper belly pain, loss of appetite, nausea, light-colored stool, dark yellow or brown urine, yellowing skin or eyes, unusual weakness or fatigue Redness, blistering, peeling, or loosening of the skin, including inside the mouth Stomach pain that is severe, does not go away, or gets worse Tumor lysis syndrome (TLS)--nausea, vomiting, diarrhea, decrease in the amount of urine, dark urine, unusual weakness or fatigue, confusion, muscle pain or cramps, fast or irregular heartbeat, joint pain Side effects that usually do not require medical attention (report to your care team if they continue or are bothersome): Headache Joint pain Nausea Runny or stuffy nose Unusual weakness or fatigue This list may not describe all possible side effects. Call your doctor for medical advice about side effects. You may report side effects to FDA at 1-800-FDA-1088. Where should I keep my medication? This medication is given in a hospital or clinic. It will not be stored at home. NOTE: This sheet is a summary. It may not cover all possible information. If you have questions about this medicine, talk to your doctor, pharmacist,  or health care provider.  2024 Elsevier/Gold Standard (2022-04-08 00:00:00)   Bendamustine Injection What is this medication? BENDAMUSTINE (BEN da MUS teen) treats leukemia and lymphoma. It works by slowing down the growth of cancer cells. This medicine may be used for other purposes; ask your health care provider or pharmacist if you have questions. COMMON BRAND NAME(S): Marilynne Halsted, VIVIMUSTA What should I tell my care team before I take this medication? They need to know if you have any of these conditions: Infection, especially a viral infection, such as chickenpox, cold sores, herpes Kidney disease Liver disease An unusual or allergic reaction to bendamustine, mannitol, other medications, foods, dyes, or preservatives Pregnant or trying to get pregnant Breast-feeding How should I use this medication? This medication is injected into a vein. It is given by your care team in a hospital or clinic setting. Talk to your care team about the use of this medication in children. Special care may be needed. Overdosage: If you think you have taken too much of this medicine contact a poison control center or emergency room at once. NOTE: This medicine is only for you. Do not share this medicine with others. What  if I miss a dose? Keep appointments for follow-up doses. It is important not to miss your dose. Call your care team if you are unable to keep an appointment. What may interact with this medication? Do not take this medication with any of the following: Clozapine This medication may also interact with the following: Atazanavir Cimetidine Ciprofloxacin Enoxacin Fluvoxamine Medications for seizures, such as carbamazepine, phenobarbital Mexiletine Rifampin Tacrine Thiabendazole Zileuton This list may not describe all possible interactions. Give your health care provider a list of all the medicines, herbs, non-prescription drugs, or dietary supplements you use. Also  tell them if you smoke, drink alcohol, or use illegal drugs. Some items may interact with your medicine. What should I watch for while using this medication? Visit your care team for regular checks on your progress. This medication may make you feel generally unwell. This is not uncommon, as chemotherapy can affect healthy cells as well as cancer cells. Report any side effects. Continue your course of treatment even though you feel ill unless your care team tells you to stop. You may need blood work while taking this medication. This medication may increase your risk of getting an infection. Call your care team for advice if you get a fever, chills, sore throat, or other symptoms of a cold or flu. Do not treat yourself. Try to avoid being around people who are sick. This medication may cause serious skin reactions. They can happen weeks to months after starting the medication. Contact your care team right away if you notice fevers or flu-like symptoms with a rash. The rash may be red or purple and then turn into blisters or peeling of the skin. You may also notice a red rash with swelling of the face, lips, or lymph nodes in your neck or under your arms. In some patients, this medication may cause a serious brain infection that may cause death. If you have any problems seeing, thinking, speaking, walking, or standing, tell your care team right away. If you cannot reach your care team, urgently seek other source of medical care. This medication may increase your risk to bruise or bleed. Call your care team if you notice any unusual bleeding. Talk to your care team about your risk of cancer. You may be more at risk for certain types of cancer if you take this medication. Talk to your care team about your risk of skin cancer. You may be more at risk for skin cancer if you take this medication. Talk to your care team if you or your partner wish to become pregnant or think either of you might be pregnant. This  medication can cause serious birth defects if taken during pregnancy or for up to 6 months after the last dose. A negative pregnancy test is required before starting this medication. A reliable form of contraception is recommended while taking this medication and for 6 months after the last dose. Talk to your care team about reliable forms of contraception. Wear a condom while taking this medication and for at least 3 months after the last dose. Do not breast-feed while taking this medication or for at least 1 week after the last dose. This medication may cause infertility. Talk to your care team if you are concerned about your fertility. What side effects may I notice from receiving this medication? Side effects that you should report to your care team as soon as possible: Allergic reactions--skin rash, itching, hives, swelling of the face, lips, tongue, or throat Infection--fever, chills,  cough, sore throat, wounds that don't heal, pain or trouble when passing urine, general feeling of discomfort or being unwell Infusion reactions--chest pain, shortness of breath or trouble breathing, feeling faint or lightheaded Liver injury--right upper belly pain, loss of appetite, nausea, light-colored stool, dark yellow or brown urine, yellowing skin or eyes, unusual weakness or fatigue Low red blood cell level--unusual weakness or fatigue, dizziness, headache, trouble breathing Painful swelling, warmth, or redness of the skin, blisters or sores at the infusion site Rash, fever, and swollen lymph nodes Redness, blistering, peeling, or loosening of the skin, including inside the mouth Tumor lysis syndrome (TLS)--nausea, vomiting, diarrhea, decrease in the amount of urine, dark urine, unusual weakness or fatigue, confusion, muscle pain or cramps, fast or irregular heartbeat, joint pain Unusual bruising or bleeding Side effects that usually do not require medical attention (report to your care team if they  continue or are bothersome): Diarrhea Fatigue Headache Loss of appetite Nausea Vomiting This list may not describe all possible side effects. Call your doctor for medical advice about side effects. You may report side effects to FDA at 1-800-FDA-1088. Where should I keep my medication? This medication is given in a hospital or clinic. It will not be stored at home. NOTE: This sheet is a summary. It may not cover all possible information. If you have questions about this medicine, talk to your doctor, pharmacist, or health care provider.  2024 Elsevier/Gold Standard (2022-03-09 00:00:00)

## 2023-09-05 NOTE — Progress Notes (Signed)
Chad Gross   Diagnosis: CLL  INTERVAL HISTORY:   Chad Gross returns as scheduled.  He completed cycle 2 Bendamustine/Rituxan beginning 08/08/2023.  He denies nausea/vomiting.  No mouth sores.  No diarrhea.  He notes mild constipation.  No rash.  No fevers or sweats.  No signs of a reaction with Rituxan.  He in general feels better.  Appetite has improved.  Objective:  Vital signs in last 24 hours:  Blood pressure 121/80, pulse 77, temperature 98.1 F (36.7 C), temperature source Temporal, resp. rate 18, height 5\' 11"  (1.803 m), weight 185 lb (83.9 kg), SpO2 100%.    HEENT: No thrush or ulcers. Resp: Lungs clear bilaterally. Cardio: Regular rate and rhythm. GI: Masslike fullness mainly left abdomen extending to the level of the umbilicus and just crossing midline. Vascular: No leg edema.  Right lower leg is slightly larger than the left lower leg.   Lab Results:  Lab Results  Component Value Date   WBC 4.0 09/05/2023   HGB 12.0 (L) 09/05/2023   HCT 34.7 (L) 09/05/2023   MCV 93.8 09/05/2023   PLT 105 (L) 09/05/2023   NEUTROABS 2.7 09/05/2023    Imaging:  No results found.  Medications: I have reviewed the patient's current medications.  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 He will return for a monthly PT check.  Indwelling IVC catheter.  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        4. 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 erectile dysfunction-followed by Dr. Annabell Howells , status post placement of a penile implant at [facility] in April 2014            7. CT of the abdomen/pelvis at [facility] urology February 2014 for evaluation of hematuria-12 mm external iliac nodes and left periaortic retroperitoneal lymph node-11 mm-potentially related to CLL      8.  CLL-flow cytometry of the peripheral blood  12/27/2017 monoclonal B-cell population consistent with CLL, peripheral lymphocytosis and small palpable lymph nodes          CT abdomen/pelvis 09/18/2019-extensive abdominal pelvic lymphadenopathy including a left periaortic node measuring 5.7 cm 08/13/2020 CLL FISH panel-no evidence of trisomy 12; no evidence of [id number]; no evidence of p53 deletion or amplification; deletion of ATM present 11/05/2020-peripheral blood TP53 mutation-negative Cycle 1 bendamustine/Rituxan 10/08/2020, 10/09/2020 Cycle 2 bendamustine/Rituxan 11/05/2020, 11/06/2020 Cycle 3 bendamustine/Rituxan 12/03/2020, 12/04/2020 Cycle 4 bendamustine/Rituxan 12/31/2020, 01/01/2021 CTs 01/19/2021-decrease in bulky abdomen/pelvic lymphadenopathy with more widespread ill-defined "haziness "in the mesentery and retroperitoneal fat, decreased pelvic lymphadenopathy Cycle 5 bendamustine/Rituxan 01/28/2021, 01/29/2021 Cycle 6 bendamustine/rituximab 02/25/2021, 02/26/2021 CT 02/22/2023-mild generalized lymphadenopathy in the neck, significant increase in chest andabdominal/pelvic lymph nodes, dominant nodal mass in the mesentery CT 06/25/2023-progressive lymphadenopathy in the abdomen and pelvis, dominant confluent mass in the upper abdomen/retroperitoneum has enlarged, stable small nodes in the chest Cycle 1 Bendamustine/Rituxan 07/07/2023, 07/08/2023 Cycle 2 Bendamustine/Rituxan 08/08/2023, 08/09/2023 CT abdomen/pelvis 08/30/2023-bulky matted lymphadenopathy and soft tissue mass encasing retroperitoneal structures and extending into the small bowel mesentery slightly diminished in size.  Additional matted gastrohepatic ligament, portacaval, porta hepatis, bilateral iliac and inguinal lymph nodes also slightly diminished in size.  Spleen measures smaller. Cycle 3 Bendamustine/Rituxan 09/05/2023, 09/06/2023     9.  Basal cell carcinoma removed from the left superior central forehead 01/21/2020    10.  Renal insufficiency    11.  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  Indeterminate left hepatic lobe lesion on CT 02/22/2023 13.  Fever and altered mental status following cycle 2 Cemiplimab-no source for infection identified  Disposition: Chad Gross appears stable.  He has completed 2 cycles of Bendamustine/Rituxan.  He is tolerating treatment well.  Performance status has improved.  Recent restaging CTs with improvement.  Results/images reviewed with him and his significant other at today's visit.  He agrees with the recommendation to continue Bendamustine/Rituxan, cycle 3 today.  CBC and chemistry panel reviewed.  Labs adequate to proceed as above.  He has mild thrombocytopenia.  He understands to contact the office with bleeding.  INR at goal range.  He will continue current dose of Coumadin.  He will return for follow-up and treatment in 4 weeks.  We are available to see him sooner if needed.  Patient seen with Dr. Truett Gross.    Lonna Cobb ANP/GNP-BC   09/05/2023  9:29 AM   This was a shared visit with Lonna Cobb.  Chad Gross was interviewed and examined.  We reviewed the restaging CT findings and images with him.  He has completed 2 cycles of salvage therapy with Bendamustine/rituximab.  There has been clinical and radiologic improvement.  The x-rays do not reveal a marked response to treatment.  We discussed changing to a different systemic therapy.  Chad Gross would like to continue Bendamustine/rituximab.  He will complete another cycle beginning today.  I was present for greater than 50% of today's  visit.  I performed medical decision making.  Mancel Bale, MD

## 2023-09-05 NOTE — Progress Notes (Signed)
Patient seen by Lonna Cobb NP today  Vitals are within treatment parameters:Yes   Labs are within treatment parameters: Yes   Treatment plan has been signed: Yes   Per physician team, Patient is ready for treatment and there are NO modifications to the treatment plan.

## 2023-09-06 ENCOUNTER — Inpatient Hospital Stay: Payer: MEDICARE

## 2023-09-06 VITALS — BP 121/64 | HR 65 | Temp 97.9°F | Resp 18

## 2023-09-06 DIAGNOSIS — C9110 Chronic lymphocytic leukemia of B-cell type not having achieved remission: Secondary | ICD-10-CM

## 2023-09-06 DIAGNOSIS — Z5112 Encounter for antineoplastic immunotherapy: Secondary | ICD-10-CM | POA: Diagnosis not present

## 2023-09-06 MED ORDER — SODIUM CHLORIDE 0.9 % IV SOLN: Freq: Once | INTRAVENOUS | Status: AC

## 2023-09-06 MED ORDER — SODIUM CHLORIDE 0.9 % IV SOLN
50.0000 mg/m2 | Freq: Once | INTRAVENOUS | Status: AC
  Administered 2023-09-06: 100 mg via INTRAVENOUS
  Filled 2023-09-06: qty 4

## 2023-09-06 MED ORDER — SODIUM CHLORIDE 0.9 % IV SOLN
10.0000 mg | Freq: Once | INTRAVENOUS | Status: AC
  Administered 2023-09-06: 10 mg via INTRAVENOUS
  Filled 2023-09-06: qty 10

## 2023-09-06 NOTE — Patient Instructions (Signed)
[facility]  Discharge Instructions: Thank you for choosing [facility] to provide your oncology and hematology care.   If you have a lab appointment with the Cancer Center, please go directly to the Cancer Center and check in at the registration area.   Wear comfortable clothing and clothing appropriate for easy access to any Portacath or PICC line.   We strive to give you quality time with your provider. You may need to reschedule your appointment if you arrive late (15 or more minutes).  Arriving late affects you and other patients whose appointments are after yours.  Also, if you miss three or more appointments without notifying the office, you may be dismissed from the clinic at the provider's discretion.      For prescription refill requests, have your pharmacy contact our office and allow 72 hours for refills to be completed.    Today you received the following chemotherapy and/or immunotherapy agents: bendamustine      To help prevent nausea and vomiting after your treatment, we encourage you to take your nausea medication as directed.  BELOW ARE SYMPTOMS THAT SHOULD BE REPORTED IMMEDIATELY: *FEVER GREATER THAN 100.4 F (38 C) OR HIGHER *CHILLS OR SWEATING *NAUSEA AND VOMITING THAT IS NOT CONTROLLED WITH YOUR NAUSEA MEDICATION *UNUSUAL SHORTNESS OF BREATH *UNUSUAL BRUISING OR BLEEDING *URINARY PROBLEMS (pain or burning when urinating, or frequent urination) *BOWEL PROBLEMS (unusual diarrhea, constipation, pain near the anus) TENDERNESS IN MOUTH AND THROAT WITH OR WITHOUT PRESENCE OF ULCERS (sore throat, sores in mouth, or a toothache) UNUSUAL RASH, SWELLING OR PAIN  UNUSUAL VAGINAL DISCHARGE OR ITCHING   Items with * indicate a potential emergency and should be followed up as soon as possible or go to the Emergency Department if any problems should occur.  Please show the CHEMOTHERAPY ALERT CARD or IMMUNOTHERAPY ALERT CARD at  check-in to the Emergency Department and triage nurse.  Should you have questions after your visit or need to cancel or reschedule your appointment, please contact [facility]  Dept: [phone]  and follow the prompts.  Office hours are 8:00 a.m. to 4:30 p.m. Monday - Friday. Please note that voicemails left after 4:00 p.m. may not be returned until the following business day.  We are closed weekends and major holidays. You have access to a nurse at all times for urgent questions. Please call the main number to the clinic Dept: [phone] and follow the prompts.   For any non-urgent questions, you may also contact your provider using MyChart. We now offer e-Visits for anyone 18 and older to request care online for non-urgent symptoms. For details visit [email or web address].   Also download the MyChart app! Go to the app store, search "MyChart", open the app, select [place] [place], and log in with your MyChart username and password.  Bendamustine Injection What is this medication? BENDAMUSTINE (BEN da MUS teen) treats leukemia and lymphoma. It works by slowing down the growth of cancer cells. This medicine may be used for other purposes; ask your health care provider or pharmacist if you have questions. COMMON BRAND NAME(S): BELRAPZO, BENDEKA, Treanda, VIVIMUSTA What should I tell my care team before I take this medication? They need to know if you have any of these conditions: Infection, especially a viral infection, such as chickenpox, cold sores, herpes Kidney disease Liver disease An unusual or allergic reaction to bendamustine, mannitol, other medications, foods, dyes, or preservatives Pregnant or trying to get   pregnant Breast-feeding How should I use this medication? This medication is injected into a vein. It is given by your care team in a hospital or clinic setting. Talk to your care team about the use of this medication in children. Special care  may be needed. Overdosage: If you think you have taken too much of this medicine contact a poison control center or emergency room at once. NOTE: This medicine is only for you. Do not share this medicine with others. What if I miss a dose? Keep appointments for follow-up doses. It is important not to miss your dose. Call your care team if you are unable to keep an appointment. What may interact with this medication? Do not take this medication with any of the following: Clozapine This medication may also interact with the following: Atazanavir Cimetidine Ciprofloxacin Enoxacin Fluvoxamine Medications for seizures, such as carbamazepine, phenobarbital Mexiletine Rifampin Tacrine Thiabendazole Zileuton This list may not describe all possible interactions. Give your health care provider a list of all the medicines, herbs, non-prescription drugs, or dietary supplements you use. Also tell them if you smoke, drink alcohol, or use illegal drugs. Some items may interact with your medicine. What should I watch for while using this medication? Visit your care team for regular checks on your progress. This medication may make you feel generally unwell. This is not uncommon, as chemotherapy can affect healthy cells as well as cancer cells. Report any side effects. Continue your course of treatment even though you feel ill unless your care team tells you to stop. You may need blood work while taking this medication. This medication may increase your risk of getting an infection. Call your care team for advice if you get a fever, chills, sore throat, or other symptoms of a cold or flu. Do not treat yourself. Try to avoid being around people who are sick. This medication may cause serious skin reactions. They can happen weeks to months after starting the medication. Contact your care team right away if you notice fevers or flu-like symptoms with a rash. The rash may be red or purple and then turn into  blisters or peeling of the skin. You may also notice a red rash with swelling of the face, lips, or lymph nodes in your neck or under your arms. In some patients, this medication may cause a serious brain infection that may cause death. If you have any problems seeing, thinking, speaking, walking, or standing, tell your care team right away. If you cannot reach your care team, urgently seek other source of medical care. This medication may increase your risk to bruise or bleed. Call your care team if you notice any unusual bleeding. Talk to your care team about your risk of cancer. You may be more at risk for certain types of cancer if you take this medication. Talk to your care team about your risk of skin cancer. You may be more at risk for skin cancer if you take this medication. Talk to your care team if you or your partner wish to become pregnant or think either of you might be pregnant. This medication can cause serious birth defects if taken during pregnancy or for up to 6 months after the last dose. A negative pregnancy test is required before starting this medication. A reliable form of contraception is recommended while taking this medication and for 6 months after the last dose. Talk to your care team about reliable forms of contraception. Wear a condom while taking this medication   and for at least 3 months after the last dose. Do not breast-feed while taking this medication or for at least 1 week after the last dose. This medication may cause infertility. Talk to your care team if you are concerned about your fertility. What side effects may I notice from receiving this medication? Side effects that you should report to your care team as soon as possible: Allergic reactions--skin rash, itching, hives, swelling of the face, lips, tongue, or throat Infection--fever, chills, cough, sore throat, wounds that don't heal, pain or trouble when passing urine, general feeling of discomfort or being  unwell Infusion reactions--chest pain, shortness of breath or trouble breathing, feeling faint or lightheaded Liver injury--right upper belly pain, loss of appetite, nausea, light-colored stool, dark yellow or brown urine, yellowing skin or eyes, unusual weakness or fatigue Low red blood cell level--unusual weakness or fatigue, dizziness, headache, trouble breathing Painful swelling, warmth, or redness of the skin, blisters or sores at the infusion site Rash, fever, and swollen lymph nodes Redness, blistering, peeling, or loosening of the skin, including inside the mouth Tumor lysis syndrome (TLS)--nausea, vomiting, diarrhea, decrease in the amount of urine, dark urine, unusual weakness or fatigue, confusion, muscle pain or cramps, fast or irregular heartbeat, joint pain Unusual bruising or bleeding Side effects that usually do not require medical attention (report to your care team if they continue or are bothersome): Diarrhea Fatigue Headache Loss of appetite Nausea Vomiting This list may not describe all possible side effects. Call your doctor for medical advice about side effects. You may report side effects to FDA at 1-800-FDA-1088. Where should I keep my medication? This medication is given in a hospital or clinic. It will not be stored at home. NOTE: This sheet is a summary. It may not cover all possible information. If you have questions about this medicine, talk to your doctor, pharmacist, or health care provider.  2024 Elsevier/Gold Standard (2022-03-09 00:00:00)   

## 2023-09-15 ENCOUNTER — Other Ambulatory Visit: Payer: Self-pay | Admitting: Oncology

## 2023-09-20 ENCOUNTER — Other Ambulatory Visit: Payer: Self-pay | Admitting: Oncology

## 2023-09-20 DIAGNOSIS — Z7901 Long term (current) use of anticoagulants: Secondary | ICD-10-CM

## 2023-09-21 ENCOUNTER — Encounter: Payer: Self-pay | Admitting: Oncology

## 2023-09-21 MED ORDER — WARFARIN SODIUM 10 MG PO TABS: 10.0000 mg | ORAL_TABLET | Freq: Every day | ORAL | 0 refills | Status: DC

## 2023-09-21 MED ORDER — WARFARIN SODIUM 2.5 MG PO TABS: ORAL_TABLET | ORAL | 0 refills | Status: DC

## 2023-10-01 ENCOUNTER — Other Ambulatory Visit: Payer: Self-pay | Admitting: Oncology

## 2023-10-03 ENCOUNTER — Inpatient Hospital Stay (HOSPITAL_BASED_OUTPATIENT_CLINIC_OR_DEPARTMENT_OTHER): Payer: MEDICARE | Admitting: Oncology

## 2023-10-03 ENCOUNTER — Inpatient Hospital Stay: Payer: MEDICARE | Attending: Oncology

## 2023-10-03 ENCOUNTER — Inpatient Hospital Stay: Payer: MEDICARE

## 2023-10-03 ENCOUNTER — Encounter: Payer: Self-pay | Admitting: Oncology

## 2023-10-03 ENCOUNTER — Encounter: Payer: Self-pay | Admitting: *Deleted

## 2023-10-03 VITALS — BP 140/78 | HR 72 | Temp 98.1°F | Resp 18 | Ht 71.0 in | Wt 187.1 lb

## 2023-10-03 VITALS — BP 112/63 | HR 67 | Temp 97.9°F | Resp 18

## 2023-10-03 DIAGNOSIS — D696 Thrombocytopenia, unspecified: Secondary | ICD-10-CM | POA: Insufficient documentation

## 2023-10-03 DIAGNOSIS — Z5112 Encounter for antineoplastic immunotherapy: Secondary | ICD-10-CM | POA: Insufficient documentation

## 2023-10-03 DIAGNOSIS — Z79899 Other long term (current) drug therapy: Secondary | ICD-10-CM | POA: Diagnosis not present

## 2023-10-03 DIAGNOSIS — C9110 Chronic lymphocytic leukemia of B-cell type not having achieved remission: Secondary | ICD-10-CM | POA: Diagnosis not present

## 2023-10-03 DIAGNOSIS — Z7901 Long term (current) use of anticoagulants: Secondary | ICD-10-CM

## 2023-10-03 DIAGNOSIS — N529 Male erectile dysfunction, unspecified: Secondary | ICD-10-CM | POA: Diagnosis not present

## 2023-10-03 DIAGNOSIS — Z5111 Encounter for antineoplastic chemotherapy: Secondary | ICD-10-CM | POA: Insufficient documentation

## 2023-10-03 LAB — CBC WITH DIFFERENTIAL (CANCER CENTER ONLY)
Abs Immature Granulocytes: 0.02 10*3/uL (ref 0.00–0.07)
Basophils Absolute: 0 10*3/uL (ref 0.0–0.1)
Basophils Relative: 1 %
Eosinophils Absolute: 0.2 10*3/uL (ref 0.0–0.5)
Eosinophils Relative: 4 %
HCT: 36.1 % — ABNORMAL LOW (ref 39.0–52.0)
Hemoglobin: 12.4 g/dL — ABNORMAL LOW (ref 13.0–17.0)
Immature Granulocytes: 1 %
Lymphocytes Relative: 16 %
Lymphs Abs: 0.7 10*3/uL (ref 0.7–4.0)
MCH: 32.7 pg (ref 26.0–34.0)
MCHC: 34.3 g/dL (ref 30.0–36.0)
MCV: 95.3 fL (ref 80.0–100.0)
Monocytes Absolute: 0.4 10*3/uL (ref 0.1–1.0)
Monocytes Relative: 10 %
Neutro Abs: 2.9 10*3/uL (ref 1.7–7.7)
Neutrophils Relative %: 68 %
Platelet Count: 97 10*3/uL — ABNORMAL LOW (ref 150–400)
RBC: 3.79 MIL/uL — ABNORMAL LOW (ref 4.22–5.81)
RDW: 13.7 % (ref 11.5–15.5)
WBC Count: 4.2 10*3/uL (ref 4.0–10.5)
nRBC: 0 % (ref 0.0–0.2)

## 2023-10-03 LAB — CMP (CANCER CENTER ONLY)
ALT: 8 U/L (ref 0–44)
AST: 17 U/L (ref 15–41)
Albumin: 4.2 g/dL (ref 3.5–5.0)
Alkaline Phosphatase: 36 U/L — ABNORMAL LOW (ref 38–126)
Anion gap: 3 — ABNORMAL LOW (ref 5–15)
BUN: 23 mg/dL (ref 8–23)
CO2: 31 mmol/L (ref 22–32)
Calcium: 9.7 mg/dL (ref 8.9–10.3)
Chloride: 100 mmol/L (ref 98–111)
Creatinine: 1.39 mg/dL — ABNORMAL HIGH (ref 0.61–1.24)
GFR, Estimated: 52 mL/min — ABNORMAL LOW (ref >60)
Glucose, Bld: 90 mg/dL (ref 70–99)
Potassium: 3.8 mmol/L (ref 3.5–5.1)
Sodium: 134 mmol/L — ABNORMAL LOW (ref 135–145)
Total Bilirubin: 0.5 mg/dL (ref <1.2)
Total Protein: 6.9 g/dL (ref 6.5–8.1)

## 2023-10-03 LAB — PROTIME-INR
INR: 1.8 — ABNORMAL HIGH (ref 0.8–1.2)
Prothrombin Time: 21.5 s — ABNORMAL HIGH (ref 11.4–15.2)

## 2023-10-03 MED ORDER — DEXAMETHASONE SODIUM PHOSPHATE 10 MG/ML IJ SOLN
10.0000 mg | Freq: Once | INTRAMUSCULAR | Status: AC
  Administered 2023-10-03: 10 mg via INTRAVENOUS
  Filled 2023-10-03: qty 1

## 2023-10-03 MED ORDER — ACETAMINOPHEN 325 MG PO TABS
650.0000 mg | ORAL_TABLET | Freq: Once | ORAL | Status: AC
Start: 2023-10-03 — End: 2023-10-03
  Administered 2023-10-03: 650 mg via ORAL
  Filled 2023-10-03: qty 2

## 2023-10-03 MED ORDER — SODIUM CHLORIDE 0.9 % IV SOLN
Freq: Once | INTRAVENOUS | Status: AC
Start: 2023-10-03 — End: 2023-10-03

## 2023-10-03 MED ORDER — PALONOSETRON HCL INJECTION 0.25 MG/5ML
0.2500 mg | Freq: Once | INTRAVENOUS | Status: AC
  Administered 2023-10-03: 0.25 mg via INTRAVENOUS
  Filled 2023-10-03: qty 5

## 2023-10-03 MED ORDER — DIPHENHYDRAMINE HCL 25 MG PO CAPS
50.0000 mg | ORAL_CAPSULE | Freq: Once | ORAL | Status: AC
  Administered 2023-10-03: 50 mg via ORAL
  Filled 2023-10-03: qty 2

## 2023-10-03 MED ORDER — BENDAMUSTINE HCL CHEMO INJECTION 100 MG/4ML
50.0000 mg/m2 | Freq: Once | INTRAVENOUS | Status: AC
  Administered 2023-10-03: 100 mg via INTRAVENOUS
  Filled 2023-10-03: qty 4

## 2023-10-03 MED ORDER — SODIUM CHLORIDE 0.9 % IV SOLN
375.0000 mg/m2 | Freq: Once | INTRAVENOUS | Status: AC
  Administered 2023-10-03: 800 mg via INTRAVENOUS
  Filled 2023-10-03: qty 50

## 2023-10-03 NOTE — Progress Notes (Signed)
Chad Gross OFFICE PROGRESS NOTE   Diagnosis: CLL, anticoagulation  INTERVAL HISTORY:   Chad Gross returns as scheduled.  He completed another cycle of Bendamustine/rituximab beginning 09/05/2023.  No nausea, rash, or symptoms of an allergic reaction.  He feels well.  Good appetite.  The abdominal mass is smaller. He reports hearing loss in the left ear.  Objective:  Vital signs in last 24 hours:  Blood pressure (!) 140/78, pulse 72, temperature 98.1 F (36.7 C), resp. rate 18, height 5\' 11"  (1.803 m), weight 187 lb 1.6 oz (84.9 kg), SpO2 100%.    HEENT: No thrush or ulcers, cerumen blocking the left external canal Resp: Lungs clear bilaterally Cardio: Regular rate and rhythm GI: No hepatosplenomegaly, upper abdominal mass centered to the left of midline appears smaller Vascular: The right lower leg is larger than the left side  Skin: Skin biopsy sites over the forehead without evidence of recurrent tumor  Portacath/PICC-without erythema  Lab Results:  Lab Results  Component Value Date   WBC 4.2 10/03/2023   HGB 12.4 (L) 10/03/2023   HCT 36.1 (L) 10/03/2023   MCV 95.3 10/03/2023   PLT 97 (L) 10/03/2023   NEUTROABS 2.9 10/03/2023    CMP  Lab Results  Component Value Date   NA 137 09/05/2023   K 3.7 09/05/2023   CL 101 09/05/2023   CO2 29 09/05/2023   GLUCOSE 92 09/05/2023   BUN 18 09/05/2023   CREATININE 1.33 (H) 09/05/2023   CALCIUM 9.5 09/05/2023   PROT 6.9 09/05/2023   ALBUMIN 4.0 09/05/2023   AST 15 09/05/2023   ALT 8 09/05/2023   ALKPHOS 41 09/05/2023   BILITOT 0.6 09/05/2023   GFRNONAA 55 (L) 09/05/2023   GFRAA 52 (L) 08/13/2020    Lab Results  Component Value Date   INR 1.8 (H) 10/03/2023   LABPROT 21.5 (H) 10/03/2023   Medications: I have reviewed the patient's current medications.   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 He will return for a monthly  PT check.  Indwelling IVC catheter.  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        4. 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 erectile dysfunction-followed by Dr. Annabell Howells , status post placement of a penile implant at [facility] in April 2014            7. CT of the abdomen/pelvis at [facility] urology February 2014 for evaluation of hematuria-12 mm external iliac nodes and left periaortic retroperitoneal lymph node-11 mm-potentially related to CLL      8.  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, peripheral lymphocytosis and small palpable lymph nodes          CT abdomen/pelvis 09/18/2019-extensive abdominal pelvic lymphadenopathy including a left periaortic node measuring 5.7 cm 08/13/2020 CLL FISH panel-no evidence of trisomy 12; no evidence of [id number]; no evidence of p53 deletion or amplification; deletion of ATM present 11/05/2020-peripheral blood TP53 mutation-negative Cycle 1 bendamustine/Rituxan 10/08/2020, 10/09/2020 Cycle 2 bendamustine/Rituxan 11/05/2020, 11/06/2020 Cycle 3 bendamustine/Rituxan 12/03/2020, 12/04/2020 Cycle 4 bendamustine/Rituxan 12/31/2020, 01/01/2021 CTs 01/19/2021-decrease in bulky abdomen/pelvic lymphadenopathy with more widespread ill-defined "haziness "in the mesentery and retroperitoneal fat, decreased pelvic lymphadenopathy Cycle 5 bendamustine/Rituxan 01/28/2021, 01/29/2021 Cycle 6 bendamustine/rituximab 02/25/2021, 02/26/2021 CT 02/22/2023-mild generalized lymphadenopathy in the neck, significant increase in chest andabdominal/pelvic lymph nodes, dominant nodal mass in the  mesentery CT 06/25/2023-progressive lymphadenopathy in the abdomen and pelvis, dominant confluent mass in the upper abdomen/retroperitoneum has enlarged, stable small nodes in the chest Cycle 1 Bendamustine/Rituxan 07/07/2023, 07/08/2023 Cycle 2 Bendamustine/Rituxan 08/08/2023, 08/09/2023 CT  abdomen/pelvis 08/30/2023-bulky matted lymphadenopathy and soft tissue mass encasing retroperitoneal structures and extending into the small bowel mesentery slightly diminished in size.  Additional matted gastrohepatic ligament, portacaval, porta hepatis, bilateral iliac and inguinal lymph nodes also slightly diminished in size.  Spleen measures smaller. Cycle 3 Bendamustine/Rituxan 09/05/2023, 09/06/2023 Cycle 4 Bendamustine/Rituxan 10/03/2023, 10/04/2023     9.  Basal cell carcinoma removed from the left superior central forehead 01/21/2020    10.  Renal insufficiency    11.  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  Indeterminate left hepatic lobe lesion on CT 02/22/2023 13.  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  He has completed 3 cycles of salvage therapy with Bendamustine/rituximab.  He has tolerated the treatment well.  His clinical status and the palpable abdominal mass have improved.  The platelets are mildly decreased.  The plan is to proceed with cycle 4 Bendamustine/rituximab today.  He is in clinical remission from the squamous cell skin cancers.  He continues Coumadin anticoagulation.  He will remain on the same dose of Coumadin.  Chad Gross will return for a nadir CBC in 3  weeks.  He will be scheduled for an office visit during the week of 11/07/2023.  We will refer him to Dr. Dorothea Ogle [place] to evaluate the left ear cerumen.  [name], MD  10/03/2023  8:46 AM

## 2023-10-03 NOTE — Progress Notes (Signed)
Patient seen by Dr. [name] today  Vitals are within treatment parameters:Yes   Labs are within treatment parameters: No (Please specify and give further instructions.) Platelet count 97--OK to proceed  Treatment plan has been signed: Yes   Per physician team, Patient is ready for treatment and there are NO modifications to the treatment plan.

## 2023-10-03 NOTE — Patient Instructions (Signed)
[place] [facility]   Discharge Instructions: Thank you for choosing [facility] to provide your oncology and hematology care.   If you have a lab appointment with the Cancer Center, please go directly to the Cancer Center and check in at the registration area.   Wear comfortable clothing and clothing appropriate for easy access to any Portacath or PICC line.   We strive to give you quality time with your provider. You may need to reschedule your appointment if you arrive late (15 or more minutes).  Arriving late affects you and other patients whose appointments are after yours.  Also, if you miss three or more appointments without notifying the office, you may be dismissed from the clinic at the provider's discretion.      For prescription refill requests, have your pharmacy contact our office and allow 72 hours for refills to be completed.    Today you received the following chemotherapy and/or immunotherapy agents Rituximab-pvvr (RUXIENCE) & Bendamustine (BENDEKA).      To help prevent nausea and vomiting after your treatment, we encourage you to take your nausea medication as directed.  BELOW ARE SYMPTOMS THAT SHOULD BE REPORTED IMMEDIATELY: *FEVER GREATER THAN 100.4 F (38 C) OR HIGHER *CHILLS OR SWEATING *NAUSEA AND VOMITING THAT IS NOT CONTROLLED WITH YOUR NAUSEA MEDICATION *UNUSUAL SHORTNESS OF BREATH *UNUSUAL BRUISING OR BLEEDING *URINARY PROBLEMS (pain or burning when urinating, or frequent urination) *BOWEL PROBLEMS (unusual diarrhea, constipation, pain near the anus) TENDERNESS IN MOUTH AND THROAT WITH OR WITHOUT PRESENCE OF ULCERS (sore throat, sores in mouth, or a toothache) UNUSUAL RASH, SWELLING OR PAIN  UNUSUAL VAGINAL DISCHARGE OR ITCHING   Items with * indicate a potential emergency and should be followed up as soon as possible or go to the Emergency Department if any problems should occur.  Please show the CHEMOTHERAPY ALERT  CARD or IMMUNOTHERAPY ALERT CARD at check-in to the Emergency Department and triage nurse.  Should you have questions after your visit or need to cancel or reschedule your appointment, please contact [place] [facility]  Dept: [phone]  and follow the prompts.  Office hours are 8:00 a.m. to 4:30 p.m. Monday - Friday. Please note that voicemails left after 4:00 p.m. may not be returned until the following business day.  We are closed weekends and major holidays. You have access to a nurse at all times for urgent questions. Please call the main number to the clinic Dept: [phone] and follow the prompts.   For any non-urgent questions, you may also contact your provider using MyChart. We now offer e-Visits for anyone 45 and older to request care online for non-urgent symptoms. For details visit [email or web address].   Also download the MyChart app! Go to the app store, search "MyChart", open the app, select [place], and log in with your MyChart username and password.  Rituximab Injection What is this medication? RITUXIMAB (ri TUX i mab) treats leukemia and lymphoma. It works by blocking a protein that causes cancer cells to grow and multiply. This helps to slow or stop the spread of cancer cells. It may also be used to treat autoimmune conditions, such as arthritis. It works by slowing down an overactive immune system. It is a monoclonal antibody. This medicine may be used for other purposes; ask your health care provider or pharmacist if you have questions. COMMON BRAND NAME(S): RIABNI, Rituxan, RUXIENCE, truxima What should I tell my care team before I take this  medication? They need to know if you have any of these conditions: Chest pain Heart disease Immune system problems Infection, such as chickenpox, cold sores, hepatitis B, herpes Irregular heartbeat or rhythm Kidney disease Low blood counts, such as low white cells, platelets, red cells Lung  disease Recent or upcoming vaccine An unusual or allergic reaction to rituximab, other medications, foods, dyes, or preservatives Pregnant or trying to get pregnant Breast-feeding How should I use this medication? This medication is injected into a vein. It is given by a care team in a hospital or clinic setting. A special MedGuide will be given to you before each treatment. Be sure to read this information carefully each time. Talk to your care team about the use of this medication in children. While this medication may be prescribed for children as young as 6 months for selected conditions, precautions do apply. Overdosage: If you think you have taken too much of this medicine contact a poison control center or emergency room at once. NOTE: This medicine is only for you. Do not share this medicine with others. What if I miss a dose? Keep appointments for follow-up doses. It is important not to miss your dose. Call your care team if you are unable to keep an appointment. What may interact with this medication? Do not take this medication with any of the following: Live vaccines This medication may also interact with the following: Cisplatin This list may not describe all possible interactions. Give your health care provider a list of all the medicines, herbs, non-prescription drugs, or dietary supplements you use. Also tell them if you smoke, drink alcohol, or use illegal drugs. Some items may interact with your medicine. What should I watch for while using this medication? Your condition will be monitored carefully while you are receiving this medication. You may need blood work while taking this medication. This medication can cause serious infusion reactions. To reduce the risk your care team may give you other medications to take before receiving this one. Be sure to follow the directions from your care team. This medication may increase your risk of getting an infection. Call your care  team for advice if you get a fever, chills, sore throat, or other symptoms of a cold or flu. Do not treat yourself. Try to avoid being around people who are sick. Call your care team if you are around anyone with measles, chickenpox, or if you develop sores or blisters that do not heal properly. Avoid taking medications that contain aspirin, acetaminophen, ibuprofen, naproxen, or ketoprofen unless instructed by your care team. These medications may hide a fever. This medication may cause serious skin reactions. They can happen weeks to months after starting the medication. Contact your care team right away if you notice fevers or flu-like symptoms with a rash. The rash may be red or purple and then turn into blisters or peeling of the skin. You may also notice a red rash with swelling of the face, lips, or lymph nodes in your neck or under your arms. In some patients, this medication may cause a serious brain infection that may cause death. If you have any problems seeing, thinking, speaking, walking, or standing, tell your care team right away. If you cannot reach your care team, urgently seek another source of medical care. Talk to your care team if you may be pregnant. Serious birth defects can occur if you take this medication during pregnancy and for 12 months after the last dose. You will need  a negative pregnancy test before starting this medication. Contraception is recommended while taking this medication and for 12 months after the last dose. Your care team can help you find the option that works for you. Do not breastfeed while taking this medication and for at least 6 months after the last dose. What side effects may I notice from receiving this medication? Side effects that you should report to your care team as soon as possible: Allergic reactions or angioedema--skin rash, itching or hives, swelling of the face, eyes, lips, tongue, arms, or legs, trouble swallowing or breathing Bowel  blockage--stomach cramping, unable to have a bowel movement or pass gas, loss of appetite, vomiting Dizziness, loss of balance or coordination, confusion or trouble speaking Heart attack--pain or tightness in the chest, shoulders, arms, or jaw, nausea, shortness of breath, cold or clammy skin, feeling faint or lightheaded Heart rhythm changes--fast or irregular heartbeat, dizziness, feeling faint or lightheaded, chest pain, trouble breathing Infection--fever, chills, cough, sore throat, wounds that don't heal, pain or trouble when passing urine, general feeling of discomfort or being unwell Infusion reactions--chest pain, shortness of breath or trouble breathing, feeling faint or lightheaded Kidney injury--decrease in the amount of urine, swelling of the ankles, hands, or feet Liver injury--right upper belly pain, loss of appetite, nausea, light-colored stool, dark yellow or brown urine, yellowing skin or eyes, unusual weakness or fatigue Redness, blistering, peeling, or loosening of the skin, including inside the mouth Stomach pain that is severe, does not go away, or gets worse Tumor lysis syndrome (TLS)--nausea, vomiting, diarrhea, decrease in the amount of urine, dark urine, unusual weakness or fatigue, confusion, muscle pain or cramps, fast or irregular heartbeat, joint pain Side effects that usually do not require medical attention (report to your care team if they continue or are bothersome): Headache Joint pain Nausea Runny or stuffy nose Unusual weakness or fatigue This list may not describe all possible side effects. Call your doctor for medical advice about side effects. You may report side effects to FDA at 1-800-FDA-1088. Where should I keep my medication? This medication is given in a hospital or clinic. It will not be stored at home. NOTE: This sheet is a summary. It may not cover all possible information. If you have questions about this medicine, talk to your doctor, pharmacist,  or health care provider.  2024 Elsevier/Gold Standard (2022-04-08 00:00:00)   Bendamustine Injection What is this medication? BENDAMUSTINE (BEN da MUS teen) treats leukemia and lymphoma. It works by slowing down the growth of cancer cells. This medicine may be used for other purposes; ask your health care provider or pharmacist if you have questions. COMMON BRAND NAME(S): Marilynne Halsted, VIVIMUSTA What should I tell my care team before I take this medication? They need to know if you have any of these conditions: Infection, especially a viral infection, such as chickenpox, cold sores, herpes Kidney disease Liver disease An unusual or allergic reaction to bendamustine, mannitol, other medications, foods, dyes, or preservatives Pregnant or trying to get pregnant Breast-feeding How should I use this medication? This medication is injected into a vein. It is given by your care team in a hospital or clinic setting. Talk to your care team about the use of this medication in children. Special care may be needed. Overdosage: If you think you have taken too much of this medicine contact a poison control center or emergency room at once. NOTE: This medicine is only for you. Do not share this medicine with others. What  if I miss a dose? Keep appointments for follow-up doses. It is important not to miss your dose. Call your care team if you are unable to keep an appointment. What may interact with this medication? Do not take this medication with any of the following: Clozapine This medication may also interact with the following: Atazanavir Cimetidine Ciprofloxacin Enoxacin Fluvoxamine Medications for seizures, such as carbamazepine, phenobarbital Mexiletine Rifampin Tacrine Thiabendazole Zileuton This list may not describe all possible interactions. Give your health care provider a list of all the medicines, herbs, non-prescription drugs, or dietary supplements you use. Also  tell them if you smoke, drink alcohol, or use illegal drugs. Some items may interact with your medicine. What should I watch for while using this medication? Visit your care team for regular checks on your progress. This medication may make you feel generally unwell. This is not uncommon, as chemotherapy can affect healthy cells as well as cancer cells. Report any side effects. Continue your course of treatment even though you feel ill unless your care team tells you to stop. You may need blood work while taking this medication. This medication may increase your risk of getting an infection. Call your care team for advice if you get a fever, chills, sore throat, or other symptoms of a cold or flu. Do not treat yourself. Try to avoid being around people who are sick. This medication may cause serious skin reactions. They can happen weeks to months after starting the medication. Contact your care team right away if you notice fevers or flu-like symptoms with a rash. The rash may be red or purple and then turn into blisters or peeling of the skin. You may also notice a red rash with swelling of the face, lips, or lymph nodes in your neck or under your arms. In some patients, this medication may cause a serious brain infection that may cause death. If you have any problems seeing, thinking, speaking, walking, or standing, tell your care team right away. If you cannot reach your care team, urgently seek other source of medical care. This medication may increase your risk to bruise or bleed. Call your care team if you notice any unusual bleeding. Talk to your care team about your risk of cancer. You may be more at risk for certain types of cancer if you take this medication. Talk to your care team about your risk of skin cancer. You may be more at risk for skin cancer if you take this medication. Talk to your care team if you or your partner wish to become pregnant or think either of you might be pregnant. This  medication can cause serious birth defects if taken during pregnancy or for up to 6 months after the last dose. A negative pregnancy test is required before starting this medication. A reliable form of contraception is recommended while taking this medication and for 6 months after the last dose. Talk to your care team about reliable forms of contraception. Wear a condom while taking this medication and for at least 3 months after the last dose. Do not breast-feed while taking this medication or for at least 1 week after the last dose. This medication may cause infertility. Talk to your care team if you are concerned about your fertility. What side effects may I notice from receiving this medication? Side effects that you should report to your care team as soon as possible: Allergic reactions--skin rash, itching, hives, swelling of the face, lips, tongue, or throat Infection--fever, chills,  cough, sore throat, wounds that don't heal, pain or trouble when passing urine, general feeling of discomfort or being unwell Infusion reactions--chest pain, shortness of breath or trouble breathing, feeling faint or lightheaded Liver injury--right upper belly pain, loss of appetite, nausea, light-colored stool, dark yellow or brown urine, yellowing skin or eyes, unusual weakness or fatigue Low red blood cell level--unusual weakness or fatigue, dizziness, headache, trouble breathing Painful swelling, warmth, or redness of the skin, blisters or sores at the infusion site Rash, fever, and swollen lymph nodes Redness, blistering, peeling, or loosening of the skin, including inside the mouth Tumor lysis syndrome (TLS)--nausea, vomiting, diarrhea, decrease in the amount of urine, dark urine, unusual weakness or fatigue, confusion, muscle pain or cramps, fast or irregular heartbeat, joint pain Unusual bruising or bleeding Side effects that usually do not require medical attention (report to your care team if they  continue or are bothersome): Diarrhea Fatigue Headache Loss of appetite Nausea Vomiting This list may not describe all possible side effects. Call your doctor for medical advice about side effects. You may report side effects to FDA at 1-800-FDA-1088. Where should I keep my medication? This medication is given in a hospital or clinic. It will not be stored at home. NOTE: This sheet is a summary. It may not cover all possible information. If you have questions about this medicine, talk to your doctor, pharmacist, or health care provider.  2024 Elsevier/Gold Standard (2022-03-09 00:00:00)

## 2023-10-04 ENCOUNTER — Encounter (HOSPITAL_BASED_OUTPATIENT_CLINIC_OR_DEPARTMENT_OTHER): Payer: Self-pay | Admitting: Family Medicine

## 2023-10-04 ENCOUNTER — Ambulatory Visit (INDEPENDENT_AMBULATORY_CARE_PROVIDER_SITE_OTHER): Payer: MEDICARE | Admitting: Family Medicine

## 2023-10-04 ENCOUNTER — Inpatient Hospital Stay: Payer: MEDICARE

## 2023-10-04 VITALS — BP 118/72 | HR 72 | Ht 71.0 in | Wt 189.7 lb

## 2023-10-04 VITALS — BP 113/51 | HR 66 | Temp 97.8°F | Resp 14

## 2023-10-04 DIAGNOSIS — H6122 Impacted cerumen, left ear: Secondary | ICD-10-CM | POA: Diagnosis not present

## 2023-10-04 DIAGNOSIS — Z5112 Encounter for antineoplastic immunotherapy: Secondary | ICD-10-CM | POA: Diagnosis not present

## 2023-10-04 DIAGNOSIS — C9110 Chronic lymphocytic leukemia of B-cell type not having achieved remission: Secondary | ICD-10-CM

## 2023-10-04 MED ORDER — DEXAMETHASONE SODIUM PHOSPHATE 10 MG/ML IJ SOLN
10.0000 mg | Freq: Once | INTRAMUSCULAR | Status: AC
  Administered 2023-10-04: 10 mg via INTRAVENOUS
  Filled 2023-10-04: qty 1

## 2023-10-04 MED ORDER — SODIUM CHLORIDE 0.9 % IV SOLN: Freq: Once | INTRAVENOUS | Status: AC

## 2023-10-04 MED ORDER — SODIUM CHLORIDE 0.9 % IV SOLN
50.0000 mg/m2 | Freq: Once | INTRAVENOUS | Status: AC
  Administered 2023-10-04: 100 mg via INTRAVENOUS
  Filled 2023-10-04: qty 4

## 2023-10-04 NOTE — Progress Notes (Signed)
    Procedures performed today:    None.  Independent interpretation of notes and tests performed by another provider:   None.  Brief History, Exam, Impression, and Recommendations:    BP 118/72 (BP Location: Left Arm, Patient Position: Sitting, Cuff Size: Normal)   Pulse 72   Ht 5\' 11"  (1.803 m)   Wt 189 lb 11.2 oz (86 kg)   SpO2 99%   BMI 26.46 kg/m   Impacted cerumen of left ear Assessment & Plan: Patient reports that he recently has been having symptoms of decreased hearing in left ear.  He did have recent visit with his oncologist who did look into his ear and told that he had notable amount of wax buildup which is why he is presenting today.  He has not had any pain or itching.  Has had occasional issues with impacted wax in the past. On exam, right external auditory canal is clear with very scant amount of wax along auditory canal.  Left ear with impacted cerumen noted with small amount extending out of EAC. Discussed options today, patient would be interested in proceeding with ear lavage in office today.  Also discussed recommendations for utilizing solution to help with maintaining clear auditory canals, utilizing Debrox intermittently. Ear lavage completed in office today, patient noted improvement in symptoms with this.   Return in about 6 months (around 04/02/2024).   ___________________________________________ Shahed Yeoman de [place], MD, ABFM, CAQSM Primary Care and Sports Medicine [facility]

## 2023-10-04 NOTE — Patient Instructions (Signed)
[facility] - A DEPT OF [facility]  Discharge Instructions: Thank you for choosing [facility] to provide your oncology and hematology care.   If you have a lab appointment with the Cancer Center, please go directly to the Cancer Center and check in at the registration area.   Wear comfortable clothing and clothing appropriate for easy access to any Portacath or PICC line.   We strive to give you quality time with your provider. You may need to reschedule your appointment if you arrive late (15 or more minutes).  Arriving late affects you and other patients whose appointments are after yours.  Also, if you miss three or more appointments without notifying the office, you may be dismissed from the clinic at the provider's discretion.      For prescription refill requests, have your pharmacy contact our office and allow 72 hours for refills to be completed.    Today you received the following chemotherapy and/or immunotherapy agents: bendamustine      To help prevent nausea and vomiting after your treatment, we encourage you to take your nausea medication as directed.  BELOW ARE SYMPTOMS THAT SHOULD BE REPORTED IMMEDIATELY: *FEVER GREATER THAN 100.4 F (38 C) OR HIGHER *CHILLS OR SWEATING *NAUSEA AND VOMITING THAT IS NOT CONTROLLED WITH YOUR NAUSEA MEDICATION *UNUSUAL SHORTNESS OF BREATH *UNUSUAL BRUISING OR BLEEDING *URINARY PROBLEMS (pain or burning when urinating, or frequent urination) *BOWEL PROBLEMS (unusual diarrhea, constipation, pain near the anus) TENDERNESS IN MOUTH AND THROAT WITH OR WITHOUT PRESENCE OF ULCERS (sore throat, sores in mouth, or a toothache) UNUSUAL RASH, SWELLING OR PAIN  UNUSUAL VAGINAL DISCHARGE OR ITCHING   Items with * indicate a potential emergency and should be followed up as soon as possible or go to the Emergency Department if any problems should occur.  Please show the CHEMOTHERAPY ALERT CARD or  IMMUNOTHERAPY ALERT CARD at check-in to the Emergency Department and triage nurse.  Should you have questions after your visit or need to cancel or reschedule your appointment, please contact [facility] - A DEPT OF [facility]  Dept: [phone]  and follow the prompts.  Office hours are 8:00 a.m. to 4:30 p.m. Monday - Friday. Please note that voicemails left after 4:00 p.m. may not be returned until the following business day.  We are closed weekends and major holidays. You have access to a nurse at all times for urgent questions. Please call the main number to the clinic Dept: [phone] and follow the prompts.   For any non-urgent questions, you may also contact your provider using MyChart. We now offer e-Visits for anyone 60 and older to request care online for non-urgent symptoms. For details visit [email or web address].   Also download the MyChart app! Go to the app store, search "MyChart", open the app, select [place], and log in with your MyChart username and password.

## 2023-10-04 NOTE — Assessment & Plan Note (Signed)
Patient reports that he recently has been having symptoms of decreased hearing in left ear.  He did have recent visit with his oncologist who did look into his ear and told that he had notable amount of wax buildup which is why he is presenting today.  He has not had any pain or itching.  Has had occasional issues with impacted wax in the past. On exam, right external auditory canal is clear with very scant amount of wax along auditory canal.  Left ear with impacted cerumen noted with small amount extending out of EAC. Discussed options today, patient would be interested in proceeding with ear lavage in office today.  Also discussed recommendations for utilizing solution to help with maintaining clear auditory canals, utilizing Debrox intermittently. Ear lavage completed in office today, patient noted improvement in symptoms with this.

## 2023-10-06 ENCOUNTER — Other Ambulatory Visit: Payer: Self-pay

## 2023-10-20 ENCOUNTER — Encounter (HOSPITAL_BASED_OUTPATIENT_CLINIC_OR_DEPARTMENT_OTHER): Payer: Self-pay | Admitting: Family Medicine

## 2023-10-20 ENCOUNTER — Encounter: Payer: Self-pay | Admitting: Oncology

## 2023-10-24 ENCOUNTER — Encounter: Payer: Self-pay | Admitting: Oncology

## 2023-10-24 ENCOUNTER — Inpatient Hospital Stay: Payer: MEDICARE

## 2023-10-24 DIAGNOSIS — C9110 Chronic lymphocytic leukemia of B-cell type not having achieved remission: Secondary | ICD-10-CM

## 2023-10-24 DIAGNOSIS — Z5112 Encounter for antineoplastic immunotherapy: Secondary | ICD-10-CM | POA: Diagnosis not present

## 2023-10-24 LAB — CBC WITH DIFFERENTIAL (CANCER CENTER ONLY)
Abs Immature Granulocytes: 0.02 10*3/uL (ref 0.00–0.07)
Basophils Absolute: 0 10*3/uL (ref 0.0–0.1)
Basophils Relative: 1 %
Eosinophils Absolute: 0.9 10*3/uL — ABNORMAL HIGH (ref 0.0–0.5)
Eosinophils Relative: 14 %
HCT: 37.5 % — ABNORMAL LOW (ref 39.0–52.0)
Hemoglobin: 13.1 g/dL (ref 13.0–17.0)
Immature Granulocytes: 0 %
Lymphocytes Relative: 11 %
Lymphs Abs: 0.7 10*3/uL (ref 0.7–4.0)
MCH: 33.8 pg (ref 26.0–34.0)
MCHC: 34.9 g/dL (ref 30.0–36.0)
MCV: 96.6 fL (ref 80.0–100.0)
Monocytes Absolute: 0.5 10*3/uL (ref 0.1–1.0)
Monocytes Relative: 8 %
Neutro Abs: 4.1 10*3/uL (ref 1.7–7.7)
Neutrophils Relative %: 66 %
Platelet Count: 99 10*3/uL — ABNORMAL LOW (ref 150–400)
RBC: 3.88 MIL/uL — ABNORMAL LOW (ref 4.22–5.81)
RDW: 13.5 % (ref 11.5–15.5)
WBC Count: 6.2 10*3/uL (ref 4.0–10.5)
nRBC: 0 % (ref 0.0–0.2)

## 2023-11-08 ENCOUNTER — Inpatient Hospital Stay: Payer: MEDICARE | Attending: Oncology | Admitting: Oncology

## 2023-11-08 ENCOUNTER — Inpatient Hospital Stay: Payer: MEDICARE

## 2023-11-08 VITALS — BP 126/67 | HR 72 | Temp 98.1°F | Resp 18 | Ht 71.0 in | Wt 188.2 lb

## 2023-11-08 DIAGNOSIS — Z7901 Long term (current) use of anticoagulants: Secondary | ICD-10-CM

## 2023-11-08 DIAGNOSIS — C9110 Chronic lymphocytic leukemia of B-cell type not having achieved remission: Secondary | ICD-10-CM | POA: Insufficient documentation

## 2023-11-08 DIAGNOSIS — Z86718 Personal history of other venous thrombosis and embolism: Secondary | ICD-10-CM | POA: Diagnosis not present

## 2023-11-08 LAB — CBC WITH DIFFERENTIAL (CANCER CENTER ONLY)
Abs Immature Granulocytes: 0.02 10*3/uL (ref 0.00–0.07)
Basophils Absolute: 0 10*3/uL (ref 0.0–0.1)
Basophils Relative: 0 %
Eosinophils Absolute: 0.6 10*3/uL — ABNORMAL HIGH (ref 0.0–0.5)
Eosinophils Relative: 12 %
HCT: 36.8 % — ABNORMAL LOW (ref 39.0–52.0)
Hemoglobin: 12.6 g/dL — ABNORMAL LOW (ref 13.0–17.0)
Immature Granulocytes: 0 %
Lymphocytes Relative: 17 %
Lymphs Abs: 0.9 10*3/uL (ref 0.7–4.0)
MCH: 32.8 pg (ref 26.0–34.0)
MCHC: 34.2 g/dL (ref 30.0–36.0)
MCV: 95.8 fL (ref 80.0–100.0)
Monocytes Absolute: 0.6 10*3/uL (ref 0.1–1.0)
Monocytes Relative: 11 %
Neutro Abs: 3.1 10*3/uL (ref 1.7–7.7)
Neutrophils Relative %: 60 %
Platelet Count: 117 10*3/uL — ABNORMAL LOW (ref 150–400)
RBC: 3.84 MIL/uL — ABNORMAL LOW (ref 4.22–5.81)
RDW: 12.9 % (ref 11.5–15.5)
WBC Count: 5.2 10*3/uL (ref 4.0–10.5)
nRBC: 0 % (ref 0.0–0.2)

## 2023-11-08 LAB — PROTIME-INR
INR: 2 — ABNORMAL HIGH (ref 0.8–1.2)
Prothrombin Time: 22.7 s — ABNORMAL HIGH (ref 11.4–15.2)

## 2023-11-08 NOTE — Progress Notes (Signed)
[facility] OFFICE PROGRESS NOTE   Diagnosis: CLL, anticoagulation  INTERVAL HISTORY:   Chad Gross returns as scheduled.  He feels well.  No abdominal pain.  No new complaint.  He continues Coumadin anticoagulation.  No bleeding.  Objective:  Vital signs in last 24 hours:  Blood pressure 126/67, pulse 72, temperature 98.1 F (36.7 C), temperature source Temporal, resp. rate 18, height 5\' 11"  (1.803 m), weight 188 lb 3.2 oz (85.4 kg), SpO2 100%.    HEENT: No thrush or ulcers Lymphatics: No cervical, supraclavicular, axillary, or inguinal nodes Resp: Lungs clear bilaterally Cardio: Regular rate and rhythm GI: No hepatosplenomegaly, firm mass at the mid abdomen superior to the umbilicus Vascular: No leg edema, the right lower leg is larger than the left side  Skin: Scarring over the forehead and face without evidence of recurrent skin cancer   Lab Results:  Lab Results  Component Value Date   WBC 5.2 11/08/2023   HGB 12.6 (L) 11/08/2023   HCT 36.8 (L) 11/08/2023   MCV 95.8 11/08/2023   PLT 117 (L) 11/08/2023   NEUTROABS 3.1 11/08/2023    CMP  Lab Results  Component Value Date   NA 134 (L) 10/03/2023   K 3.8 10/03/2023   CL 100 10/03/2023   CO2 31 10/03/2023   GLUCOSE 90 10/03/2023   BUN 23 10/03/2023   CREATININE 1.39 (H) 10/03/2023   CALCIUM 9.7 10/03/2023   PROT 6.9 10/03/2023   ALBUMIN 4.2 10/03/2023   AST 17 10/03/2023   ALT 8 10/03/2023   ALKPHOS 36 (L) 10/03/2023   BILITOT 0.5 10/03/2023   GFRNONAA 52 (L) 10/03/2023   GFRAA 52 (L) 08/13/2020    No results found for: "CEA1", "CEA", "ZOX096", "CA125"  Lab Results  Component Value Date   INR 2.0 (H) 11/08/2023   LABPROT 22.7 (H) 11/08/2023    Imaging:  No results found.  Medications: I have reviewed the patient's current medications.   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 He will return for a  monthly PT check.  Indwelling IVC catheter.  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        4. 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 erectile dysfunction-followed by Dr. Annabell Howells , status post placement of a penile implant at [facility] in April 2014            7. CT of the abdomen/pelvis at [facility] urology February 2014 for evaluation of hematuria-12 mm external iliac nodes and left periaortic retroperitoneal lymph node-11 mm-potentially related to CLL      8.  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, peripheral lymphocytosis and small palpable lymph nodes          CT abdomen/pelvis 09/18/2019-extensive abdominal pelvic lymphadenopathy including a left periaortic node measuring 5.7 cm 08/13/2020 CLL FISH panel-no evidence of trisomy 12; no evidence of D13S319; no evidence of p53 deletion or amplification; deletion of ATM present 11/05/2020-peripheral blood TP53 mutation-negative Cycle 1 bendamustine/Rituxan 10/08/2020, 10/09/2020 Cycle 2 bendamustine/Rituxan 11/05/2020, 11/06/2020 Cycle 3 bendamustine/Rituxan 12/03/2020, 12/04/2020 Cycle 4 bendamustine/Rituxan 12/31/2020, 01/01/2021 CTs 01/19/2021-decrease in bulky abdomen/pelvic lymphadenopathy with more widespread ill-defined "haziness "in the mesentery and retroperitoneal fat, decreased pelvic lymphadenopathy Cycle 5 bendamustine/Rituxan 01/28/2021, 01/29/2021 Cycle 6 bendamustine/rituximab 02/25/2021, 02/26/2021 CT 02/22/2023-mild generalized lymphadenopathy in the neck, significant increase in chest andabdominal/pelvic lymph nodes, dominant nodal  mass in the mesentery CT 06/25/2023-progressive lymphadenopathy in the abdomen and pelvis, dominant confluent mass in the upper abdomen/retroperitoneum has enlarged, stable small nodes in the chest Cycle 1 Bendamustine/Rituxan 07/07/2023, 07/08/2023 Cycle 2 Bendamustine/Rituxan 08/08/2023, 08/09/2023 CT  abdomen/pelvis 08/30/2023-bulky matted lymphadenopathy and soft tissue mass encasing retroperitoneal structures and extending into the small bowel mesentery slightly diminished in size.  Additional matted gastrohepatic ligament, portacaval, porta hepatis, bilateral iliac and inguinal lymph nodes also slightly diminished in size.  Spleen measures smaller. Cycle 3 Bendamustine/Rituxan 09/05/2023, 09/06/2023 Cycle 4 Bendamustine/Rituxan 10/03/2023, 10/04/2023     9.  Basal cell carcinoma removed from the left superior central forehead 01/21/2020    10.  Renal insufficiency    11.  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  Indeterminate left hepatic lobe lesion on CT 02/22/2023 13.  Fever and altered mental status following cycle 2 Cemiplimab-no source for infection identified      Disposition: Mr. Pasqualone appears stable.  He experienced clinical improvement following 4 cycles of Bendamustine/rituximab.  He understands there is residual CLL, likely involving the dominant abdominal mass and other lymph node sites.  The plan is to consider treatment with a BTK inhibitor when he develops disease progression.  He will continue Coumadin at the current dose.  He will return for a lab visit in 1 month and an office visit in 2 months.  [name], MD  11/08/2023  9:59 AM

## 2023-12-01 ENCOUNTER — Other Ambulatory Visit (HOSPITAL_BASED_OUTPATIENT_CLINIC_OR_DEPARTMENT_OTHER): Payer: Self-pay | Admitting: Family Medicine

## 2023-12-08 ENCOUNTER — Other Ambulatory Visit (HOSPITAL_BASED_OUTPATIENT_CLINIC_OR_DEPARTMENT_OTHER): Payer: Self-pay | Admitting: Family Medicine

## 2023-12-08 DIAGNOSIS — N183 Chronic kidney disease, stage 3 unspecified: Secondary | ICD-10-CM

## 2023-12-08 DIAGNOSIS — I10 Essential (primary) hypertension: Secondary | ICD-10-CM

## 2023-12-09 ENCOUNTER — Encounter: Payer: Self-pay | Admitting: Oncology

## 2023-12-09 ENCOUNTER — Inpatient Hospital Stay: Payer: MEDICARE | Attending: Oncology

## 2023-12-09 DIAGNOSIS — Z7901 Long term (current) use of anticoagulants: Secondary | ICD-10-CM | POA: Diagnosis not present

## 2023-12-09 DIAGNOSIS — C9110 Chronic lymphocytic leukemia of B-cell type not having achieved remission: Secondary | ICD-10-CM | POA: Diagnosis present

## 2023-12-09 DIAGNOSIS — Z86718 Personal history of other venous thrombosis and embolism: Secondary | ICD-10-CM | POA: Diagnosis not present

## 2023-12-09 LAB — CBC WITH DIFFERENTIAL (CANCER CENTER ONLY)
Abs Immature Granulocytes: 0.03 10*3/uL (ref 0.00–0.07)
Basophils Absolute: 0 10*3/uL (ref 0.0–0.1)
Basophils Relative: 1 %
Eosinophils Absolute: 0.6 10*3/uL — ABNORMAL HIGH (ref 0.0–0.5)
Eosinophils Relative: 10 %
HCT: 38.8 % — ABNORMAL LOW (ref 39.0–52.0)
Hemoglobin: 13.3 g/dL (ref 13.0–17.0)
Immature Granulocytes: 1 %
Lymphocytes Relative: 19 %
Lymphs Abs: 1.1 10*3/uL (ref 0.7–4.0)
MCH: 32.5 pg (ref 26.0–34.0)
MCHC: 34.3 g/dL (ref 30.0–36.0)
MCV: 94.9 fL (ref 80.0–100.0)
Monocytes Absolute: 0.7 10*3/uL (ref 0.1–1.0)
Monocytes Relative: 11 %
Neutro Abs: 3.5 10*3/uL (ref 1.7–7.7)
Neutrophils Relative %: 58 %
Platelet Count: 104 10*3/uL — ABNORMAL LOW (ref 150–400)
RBC: 4.09 MIL/uL — ABNORMAL LOW (ref 4.22–5.81)
RDW: 12.3 % (ref 11.5–15.5)
WBC Count: 5.9 10*3/uL (ref 4.0–10.5)
nRBC: 0 % (ref 0.0–0.2)

## 2023-12-09 LAB — PROTIME-INR
INR: 2 — ABNORMAL HIGH (ref 0.8–1.2)
Prothrombin Time: 22.5 s — ABNORMAL HIGH (ref 11.4–15.2)

## 2023-12-12 ENCOUNTER — Telehealth: Payer: Self-pay | Admitting: *Deleted

## 2023-12-12 NOTE — Telephone Encounter (Signed)
-----   Message from [name] sent at 12/09/2023  3:48 PM EST ----- Please call patient, continue Coumadin at the same dose, follow-up as scheduled

## 2023-12-12 NOTE — Telephone Encounter (Signed)
 Notified Chad Gross to continue current warfarin dosing: 10 mg daily, except 12.5 mg M & Thursday. Will recheck on 2/5. He reports that dermatology told him to hold warfarin 2 days prior to surgery. We will determine his post op course to restart at that time. F/U as scheduled.

## 2023-12-21 ENCOUNTER — Other Ambulatory Visit: Payer: Self-pay | Admitting: Oncology

## 2023-12-21 DIAGNOSIS — Z7901 Long term (current) use of anticoagulants: Secondary | ICD-10-CM

## 2024-01-09 ENCOUNTER — Inpatient Hospital Stay (HOSPITAL_BASED_OUTPATIENT_CLINIC_OR_DEPARTMENT_OTHER): Payer: MEDICARE | Admitting: Oncology

## 2024-01-09 ENCOUNTER — Inpatient Hospital Stay: Payer: MEDICARE | Attending: Oncology

## 2024-01-09 VITALS — BP 134/75 | HR 68 | Temp 98.1°F | Resp 18 | Ht 71.0 in | Wt 192.0 lb

## 2024-01-09 DIAGNOSIS — D696 Thrombocytopenia, unspecified: Secondary | ICD-10-CM | POA: Insufficient documentation

## 2024-01-09 DIAGNOSIS — C9110 Chronic lymphocytic leukemia of B-cell type not having achieved remission: Secondary | ICD-10-CM | POA: Diagnosis present

## 2024-01-09 DIAGNOSIS — Z79899 Other long term (current) drug therapy: Secondary | ICD-10-CM | POA: Insufficient documentation

## 2024-01-09 DIAGNOSIS — Z7901 Long term (current) use of anticoagulants: Secondary | ICD-10-CM

## 2024-01-09 LAB — CBC WITH DIFFERENTIAL (CANCER CENTER ONLY)
Abs Immature Granulocytes: 0.02 10*3/uL (ref 0.00–0.07)
Basophils Absolute: 0 10*3/uL (ref 0.0–0.1)
Basophils Relative: 1 %
Eosinophils Absolute: 0.4 10*3/uL (ref 0.0–0.5)
Eosinophils Relative: 7 %
HCT: 40.3 % (ref 39.0–52.0)
Hemoglobin: 13.5 g/dL (ref 13.0–17.0)
Immature Granulocytes: 0 %
Lymphocytes Relative: 20 %
Lymphs Abs: 1 10*3/uL (ref 0.7–4.0)
MCH: 32.1 pg (ref 26.0–34.0)
MCHC: 33.5 g/dL (ref 30.0–36.0)
MCV: 96 fL (ref 80.0–100.0)
Monocytes Absolute: 0.5 10*3/uL (ref 0.1–1.0)
Monocytes Relative: 10 %
Neutro Abs: 3.1 10*3/uL (ref 1.7–7.7)
Neutrophils Relative %: 62 %
Platelet Count: 100 10*3/uL — ABNORMAL LOW (ref 150–400)
RBC: 4.2 MIL/uL — ABNORMAL LOW (ref 4.22–5.81)
RDW: 12.1 % (ref 11.5–15.5)
WBC Count: 5 10*3/uL (ref 4.0–10.5)
nRBC: 0 % (ref 0.0–0.2)

## 2024-01-09 LAB — PROTIME-INR
INR: 1.2 (ref 0.8–1.2)
Prothrombin Time: 15.7 s — ABNORMAL HIGH (ref 11.4–15.2)

## 2024-01-09 NOTE — Progress Notes (Signed)
 [facility] OFFICE PROGRESS NOTE   Diagnosis: CLL, anticoagulation therapy  INTERVAL HISTORY:   Chad Gross returns as scheduled.  He feels well.  No fever or night sweats.  Good appetite.  No new complaint. He saw dermatology on 12/06/2023.  Was diagnosed with a squamous cell carcinoma involving a pink plaque at the right inferior central malar region.  He is scheduled for removal of this lesion today.  Objective:  Vital signs in last 24 hours:  Blood pressure 134/75, pulse 68, temperature 98.1 F (36.7 C), temperature source Temporal, resp. rate 18, height 5\' 11"  (1.803 m), weight 192 lb (87.1 kg), SpO2 99%.    HEENT: Neck without mass Lymphatics: No cervical, supraclavicular, axillary, or inguinal nodes Resp: Lungs clear bilaterally Cardio: Regular rate and rhythm GI: Firm masslike fullness at the left mid abdomen, nontender Vascular: No leg edema, the right lower leg is larger than the left side  Skin: Multiple nodular scars over the forehead, flat less than 1 cm erythematous area at the right medial malar region    Lab Results:  Lab Results  Component Value Date   WBC 5.0 01/09/2024   HGB 13.5 01/09/2024   HCT 40.3 01/09/2024   MCV 96.0 01/09/2024   PLT 100 (L) 01/09/2024   NEUTROABS 3.1 01/09/2024    CMP  Lab Results  Component Value Date   NA 134 (L) 10/03/2023   K 3.8 10/03/2023   CL 100 10/03/2023   CO2 31 10/03/2023   GLUCOSE 90 10/03/2023   BUN 23 10/03/2023   CREATININE 1.39 (H) 10/03/2023   CALCIUM 9.7 10/03/2023   PROT 6.9 10/03/2023   ALBUMIN 4.2 10/03/2023   AST 17 10/03/2023   ALT 8 10/03/2023   ALKPHOS 36 (L) 10/03/2023   BILITOT 0.5 10/03/2023   GFRNONAA 52 (L) 10/03/2023   GFRAA 52 (L) 08/13/2020    Medications: I have reviewed the patient's current medications.   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 He will return for a monthly PT check.   Indwelling IVC catheter.  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        4. 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 erectile dysfunction-followed by Dr. Inga Manges , status post placement of a penile implant at [facility] in April 2014            7. CT of the abdomen/pelvis at [facility] urology February 2014 for evaluation of hematuria-12 mm external iliac nodes and left periaortic retroperitoneal lymph node-11 mm-potentially related to CLL      8.  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, peripheral lymphocytosis and small palpable lymph nodes          CT abdomen/pelvis 09/18/2019-extensive abdominal pelvic lymphadenopathy including a left periaortic node measuring 5.7 cm 08/13/2020 CLL FISH panel-no evidence of trisomy 12; no evidence of [id number]; no evidence of p53 deletion or amplification; deletion of ATM present 11/05/2020-peripheral blood TP53 mutation-negative Cycle 1 bendamustine /Rituxan  10/08/2020, 10/09/2020 Cycle 2 bendamustine /Rituxan  11/05/2020, 11/06/2020 Cycle 3 bendamustine /Rituxan  12/03/2020, 12/04/2020 Cycle 4 bendamustine /Rituxan  12/31/2020, 01/01/2021 CTs 01/19/2021-decrease in bulky abdomen/pelvic lymphadenopathy with more widespread ill-defined "haziness "in the mesentery and retroperitoneal fat, decreased pelvic lymphadenopathy Cycle 5 bendamustine /Rituxan  01/28/2021, 01/29/2021 Cycle 6 bendamustine /rituximab  02/25/2021, 02/26/2021 CT 02/22/2023-mild generalized lymphadenopathy in the neck, significant increase in chest andabdominal/pelvic lymph nodes, dominant nodal mass in the mesentery  CT 06/25/2023-progressive lymphadenopathy in the abdomen and pelvis, dominant confluent mass in the upper abdomen/retroperitoneum has enlarged, stable small nodes in the chest Cycle 1 Bendamustine /Rituxan  07/07/2023, 07/08/2023 Cycle 2 Bendamustine /Rituxan  08/08/2023, 08/09/2023 CT abdomen/pelvis  08/30/2023-bulky matted lymphadenopathy and soft tissue mass encasing retroperitoneal structures and extending into the small bowel mesentery slightly diminished in size.  Additional matted gastrohepatic ligament, portacaval, porta hepatis, bilateral iliac and inguinal lymph nodes also slightly diminished in size.  Spleen measures smaller. Cycle 3 Bendamustine /Rituxan  09/05/2023, 09/06/2023 Cycle 4 Bendamustine /Rituxan  10/03/2023, 10/04/2023     9.  Basal cell carcinoma removed from the left superior central forehead 01/21/2020    10.  Renal insufficiency    11.  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  Indeterminate left hepatic lobe lesion on CT 02/22/2023 13.  Fever and altered mental status following cycle 2 Cemiplimab -no source for infection identified      Disposition: Chad Gross appears stable.  He has a persistent abdominal mass secondary to CLL.  He is asymptomatic.  He does not wish to undergo restaging CTs at present.  He will call for new symptoms.  He is stable from a hematologic standpoint.  Coumadin  was held for the otology procedure today.  He will resume Coumadin  tonight.  He will return for lab visit in 1 month and 2 months.  He will be scheduled for 67-month office visit.  Coni Deep, MD  01/09/2024  10:19 AM

## 2024-01-10 ENCOUNTER — Other Ambulatory Visit: Payer: Self-pay

## 2024-02-06 ENCOUNTER — Inpatient Hospital Stay: Payer: MEDICARE | Attending: Oncology

## 2024-02-06 DIAGNOSIS — C9110 Chronic lymphocytic leukemia of B-cell type not having achieved remission: Secondary | ICD-10-CM | POA: Insufficient documentation

## 2024-02-06 DIAGNOSIS — Z7901 Long term (current) use of anticoagulants: Secondary | ICD-10-CM | POA: Insufficient documentation

## 2024-02-06 LAB — CBC WITH DIFFERENTIAL (CANCER CENTER ONLY)
Abs Immature Granulocytes: 0.03 10*3/uL (ref 0.00–0.07)
Basophils Absolute: 0 10*3/uL (ref 0.0–0.1)
Basophils Relative: 0 %
Eosinophils Absolute: 0.5 10*3/uL (ref 0.0–0.5)
Eosinophils Relative: 8 %
HCT: 39.5 % (ref 39.0–52.0)
Hemoglobin: 13.5 g/dL (ref 13.0–17.0)
Immature Granulocytes: 1 %
Lymphocytes Relative: 21 %
Lymphs Abs: 1.3 10*3/uL (ref 0.7–4.0)
MCH: 32.4 pg (ref 26.0–34.0)
MCHC: 34.2 g/dL (ref 30.0–36.0)
MCV: 94.7 fL (ref 80.0–100.0)
Monocytes Absolute: 0.5 10*3/uL (ref 0.1–1.0)
Monocytes Relative: 9 %
Neutro Abs: 3.7 10*3/uL (ref 1.7–7.7)
Neutrophils Relative %: 61 %
Platelet Count: 91 10*3/uL — ABNORMAL LOW (ref 150–400)
RBC: 4.17 MIL/uL — ABNORMAL LOW (ref 4.22–5.81)
RDW: 12.3 % (ref 11.5–15.5)
WBC Count: 6 10*3/uL (ref 4.0–10.5)
nRBC: 0 % (ref 0.0–0.2)

## 2024-02-06 LAB — PROTIME-INR
INR: 2.1 — ABNORMAL HIGH (ref 0.8–1.2)
Prothrombin Time: 23.7 s — ABNORMAL HIGH (ref 11.4–15.2)

## 2024-02-07 ENCOUNTER — Telehealth: Payer: Self-pay | Admitting: *Deleted

## 2024-02-07 NOTE — Telephone Encounter (Signed)
-----   Message from [name] sent at 02/06/2024 11:30 AM EDT ----- Please call patient, the PT/INR is therapeutic, continue Coumadin at the same dose, follow-up as scheduled

## 2024-02-07 NOTE — Telephone Encounter (Signed)
 Mr. Bevins notified that INR is therapeutic at 2.1. He will continue current warfarin at 10 mg daily, except 12.5 mg M & Th. F/U for lab on 4/10 as scheduled.

## 2024-02-10 ENCOUNTER — Encounter (HOSPITAL_BASED_OUTPATIENT_CLINIC_OR_DEPARTMENT_OTHER): Payer: Self-pay | Admitting: Family Medicine

## 2024-02-10 ENCOUNTER — Ambulatory Visit (HOSPITAL_BASED_OUTPATIENT_CLINIC_OR_DEPARTMENT_OTHER): Payer: MEDICARE | Admitting: Family Medicine

## 2024-02-10 ENCOUNTER — Ambulatory Visit (HOSPITAL_BASED_OUTPATIENT_CLINIC_OR_DEPARTMENT_OTHER): Payer: MEDICARE

## 2024-02-10 VITALS — BP 138/78 | HR 78 | Temp 98.5°F | Ht 71.0 in | Wt 189.7 lb

## 2024-02-10 DIAGNOSIS — R051 Acute cough: Secondary | ICD-10-CM

## 2024-02-10 LAB — POC COVID19 BINAXNOW: SARS Coronavirus 2 Ag: NEGATIVE

## 2024-02-10 LAB — POCT INFLUENZA A/B
Influenza A, POC: NEGATIVE
Influenza B, POC: NEGATIVE

## 2024-02-10 MED ORDER — AMOXICILLIN 875 MG PO TABS: 875.0000 mg | ORAL_TABLET | Freq: Two times a day (BID) | ORAL | 0 refills | Status: AC

## 2024-02-10 MED ORDER — BENZONATATE 200 MG PO CAPS: 200.0000 mg | ORAL_CAPSULE | Freq: Three times a day (TID) | ORAL | 0 refills | Status: DC | PRN

## 2024-02-10 NOTE — Telephone Encounter (Signed)
 Patient scheduled for 950 3/14

## 2024-02-10 NOTE — Assessment & Plan Note (Signed)
 Patient reports ongoing symptoms of cough, sinus congestion, sinus pressure, fever.  He did have temperature of 100.8 F earlier around time of onset of symptoms earlier this week.  He reports that he did have "fever" yesterday, but reports temperature of 99.5 F.  He did have COVID and influenza testing earlier in the week at the pharmacy and this was negative.  He reports very mild shortness of breath, however no significant shortness of breath or trouble breathing with daily activities. On exam, patient is in no acute distress, vital signs stable, lungs clear to auscultation bilaterally.  Mild pharyngeal erythema, no significant cervical lymphadenopathy. Discussed that symptoms are likely related to viral etiology.  Did complete COVID and flu swab in office today, both of which were negative.  Given history and concern, can proceed with chest x-ray today for additional assessment.  Recommend continuing conservative measures, did refill Tessalon Perles which patient has utilized in the past to help with cough. Discussed possible transition of viral illness into superimposed bacterial nose, particular related to sinusitis.  Given this, prescription sent to pharmacy on file for antibiotic course.  Discussed consideration for continued conservative measures with next couple days, however if symptoms do persist or any worsening does occur, can initiate antibiotic therapy.  If starting antibiotics, advised on continuing full course of antibiotics until completion. Did review chest x-ray imaging today, no acute process seen, chest x-ray appears mostly unchanged from last x-ray completed about 6 months ago.  Will await final report from radiologist.

## 2024-02-10 NOTE — Patient Instructions (Signed)
  Medication Instructions:  Your physician recommends that you continue on your current medications as directed. Please refer to the Current Medication list given to you today. --If you need a refill on any your medications before your next appointment, please call your pharmacy first. If no refills are authorized on file call the office.--  Referrals/Procedures/Imaging: Chest xray  Follow-Up: Your next appointment:   Your physician recommends that you schedule a follow-up appointment in: as needed / keep upcoming appt  with Dr. de [place]  You will receive a text message or e-mail with a link to a survey about your care and experience with [place] today! We would greatly appreciate your feedback!   Thanks for letting us be apart of your health journey!!  Primary Care and Sports Medicine   Dr. Ceasar Mons [place]   We encourage you to activate your patient portal called "MyChart".  Sign up information is provided on this After Visit Summary.  MyChart is used to connect with patients for Virtual Visits (Telemedicine).  Patients are able to view lab/test results, encounter notes, upcoming appointments, etc.  Non-urgent messages can be sent to your provider as well. To learn more about what you can do with MyChart, please visit --  [email or web address].

## 2024-02-10 NOTE — Progress Notes (Signed)
    Procedures performed today:    None.  Independent interpretation of notes and tests performed by another provider:   None.  Brief History, Exam, Impression, and Recommendations:    BP 138/78 (BP Location: Right Arm, Patient Position: Sitting, Cuff Size: Normal)   Pulse 78   Temp 98.5 F (36.9 C) (Oral)   Ht 5\' 11"  (1.803 m)   Wt 189 lb 11.2 oz (86 kg)   SpO2 99%   BMI 26.46 kg/m   Acute cough Assessment & Plan: Patient reports ongoing symptoms of cough, sinus congestion, sinus pressure, fever.  He did have temperature of 100.8 F earlier around time of onset of symptoms earlier this week.  He reports that he did have "fever" yesterday, but reports temperature of 99.5 F.  He did have COVID and influenza testing earlier in the week at the pharmacy and this was negative.  He reports very mild shortness of breath, however no significant shortness of breath or trouble breathing with daily activities. On exam, patient is in no acute distress, vital signs stable, lungs clear to auscultation bilaterally.  Mild pharyngeal erythema, no significant cervical lymphadenopathy. Discussed that symptoms are likely related to viral etiology.  Did complete COVID and flu swab in office today, both of which were negative.  Given history and concern, can proceed with chest x-ray today for additional assessment.  Recommend continuing conservative measures, did refill Tessalon Perles which patient has utilized in the past to help with cough. Discussed possible transition of viral illness into superimposed bacterial nose, particular related to sinusitis.  Given this, prescription sent to pharmacy on file for antibiotic course.  Discussed consideration for continued conservative measures with next couple days, however if symptoms do persist or any worsening does occur, can initiate antibiotic therapy.  If starting antibiotics, advised on continuing full course of antibiotics until completion. Did review chest  x-ray imaging today, no acute process seen, chest x-ray appears mostly unchanged from last x-ray completed about 6 months ago.  Will await final report from radiologist.  Orders: -     POC COVID-19 BinaxNow -     POCT Influenza A/B -     DG Chest 2 View; Future  Other orders -     Benzonatate; Take 1 capsule (200 mg total) by mouth 3 (three) times daily as needed for cough.  Dispense: 45 capsule; Refill: 0 -     Amoxicillin; Take 1 tablet (875 mg total) by mouth 2 (two) times daily for 7 days.  Dispense: 14 tablet; Refill: 0  Return if symptoms worsen or fail to improve.   ___________________________________________ Reiner Loewen de [place], MD, ABFM, CAQSM Primary Care and Sports Medicine [facility]

## 2024-02-14 ENCOUNTER — Encounter (HOSPITAL_BASED_OUTPATIENT_CLINIC_OR_DEPARTMENT_OTHER): Payer: Self-pay

## 2024-02-14 ENCOUNTER — Ambulatory Visit (HOSPITAL_BASED_OUTPATIENT_CLINIC_OR_DEPARTMENT_OTHER): Payer: MEDICARE | Admitting: *Deleted

## 2024-02-14 DIAGNOSIS — Z0000 Encounter for general adult medical examination without abnormal findings: Secondary | ICD-10-CM | POA: Diagnosis not present

## 2024-02-14 NOTE — Progress Notes (Signed)
 Subjective:   Chad Gross is a 78 y.o. male who presents for Medicare Annual/Subsequent preventive examination.  Visit Complete: Virtual I connected with  Chad Gross on 02/14/24 by a audio enabled telemedicine application and verified that I am speaking with the correct person using two identifiers.  Patient Location: Home  Provider Location: Home Office  I discussed the limitations of evaluation and management by telemedicine. The patient expressed understanding and agreed to proceed.  Vital Signs: Because this visit was a virtual/telehealth visit, some criteria may be missing or patient reported. Any vitals not documented were not able to be obtained and vitals that have been documented are patient reported.  Patient Medicare AWV questionnaire was completed by the patient on 02-07-2024; I have confirmed that all information answered by patient is correct and no changes since this date.  Cardiac Risk Factors include: advanced age (>7men, >68 women);male gender;obesity (BMI >30kg/m2);hypertension     Objective:    There were no vitals filed for this visit. There is no height or weight on file to calculate BMI.     02/14/2024   10:50 AM 01/09/2024   10:09 AM 11/08/2023    9:24 AM 10/03/2023    8:25 AM 09/05/2023   10:17 AM 09/05/2023    9:23 AM 08/08/2023    9:45 AM  Advanced Directives  Does Patient Have a Medical Advance Directive? Yes Yes Yes Yes Yes Yes   Type of Estate agent of [employer] Power of [place];Living will Healthcare Power of [place];Living will Living will;Healthcare Power of [employer] Power of [place];Living will Living will;Healthcare Power of [employer] Power of [place];Living will  Does patient want to make changes to medical advance directive?  No - Patient declined No - Patient declined No - Patient declined No - Patient declined No - Patient declined No - Patient declined  Copy of Healthcare  Power of Attorney in Chart? No - copy requested Yes - validated most recent copy scanned in chart (See row information) Yes - validated most recent copy scanned in chart (See row information)    No - copy requested  Would patient like information on creating a medical advance directive?    No - Patient declined  No - Patient declined     Current Medications (verified) Outpatient Encounter Medications as of 02/14/2024  Medication Sig   acetaminophen (TYLENOL) 500 MG tablet Take 500-1,000 mg by mouth every 6 (six) hours as needed for moderate pain or headache.   amoxicillin (AMOXIL) 875 MG tablet Take 1 tablet (875 mg total) by mouth 2 (two) times daily for 7 days.   benzonatate (TESSALON) 200 MG capsule Take 1 capsule (200 mg total) by mouth 3 (three) times daily as needed for cough.   Calcium-Magnesium-Zinc 167-83-8 MG TABS Take 1 tablet by mouth daily.   Coenzyme Q10 (COQ10) 100 MG CAPS Take 100 mg by mouth daily.   GLUCOSAMINE CHONDROITIN COMPLX PO Take 1 tablet by mouth daily.   hydrochlorothiazide (HYDRODIURIL) 12.5 MG tablet TAKE 1 TABLET BY MOUTH EVERY DAY   hydrocortisone (ANUSOL-HC) 2.5 % rectal cream Place rectally 2 (two) times daily.   losartan (COZAAR) 50 MG tablet TAKE 1 TABLET BY MOUTH EVERY DAY   Multiple Vitamins-Minerals (SENIOR MULTIVITAMIN PLUS PO) Take 1 tablet by mouth at bedtime.   pravastatin (PRAVACHOL) 40 MG tablet TAKE 1 TABLET BY MOUTH EVERY DAY   prochlorperazine (COMPAZINE) 5 MG tablet Take 1 tablet (5 mg total) by mouth every 6 (six)  hours as needed for nausea or vomiting.   traMADol (ULTRAM) 50 MG tablet Take 1 tablet (50 mg total) by mouth every 6 (six) hours as needed for moderate pain or severe pain.   triamcinolone acetonide 40 MG/ML SUSP 40 mg, mupirocin cream 2 % CREA 15 g Apply 1 application  topically as needed (Face).   warfarin (COUMADIN) 10 MG tablet TAKE 1 TABLET (10 MG TOTAL) BY MOUTH DAILY AT 6 (SIX) AM.   warfarin (COUMADIN) 2.5 MG tablet Take 2.5 mg  with 10 mg tablet on Monday & Thursday for dose of 12.5 mg   No facility-administered encounter medications on file as of 02/14/2024.    Allergies (verified) Patient has no known allergies.   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   ED (erectile dysfunction)    History of DVT (deep vein thrombosis)    Hyperlipidemia    Hypertension    Pericarditis    Pulmonary embolism [facility])    Past Surgical History:  Procedure Laterality Date   CARDIAC CATHETERIZATION  01/21/2011   EF 50-55%   [facility] FL[facility] PLACEMENT     INGUINAL HERNIA REPAIR     BILATERAL   KNEE ARTHROSCOPY Right 05/04/2018   Procedure: ARTHROSCOPY KNEE, PARTIAL MENISCECTOMY AND CHONDROPLASTY;  Surgeon: Marcene Corning, MD;  Location: [facility] OR;  Service: Orthopedics;  Laterality: Right;   Family History  Problem Relation Age of Onset   Breast cancer Mother    Hypertension Mother    Heart failure Father    Pneumonia Father    Hypertension Father    Social History   Socioeconomic History   Marital status: Divorced    Spouse name: Not on file   Number of children: Not on file   Years of education: Not on file   Highest education level: Master's degree (e.g., MA, MS, MEng, MEd, MSW, MBA)  Occupational History   Not on file  Tobacco Use   Smoking status: Never    Passive exposure: Never   Smokeless tobacco: Never  Vaping Use   Vaping status: Never Used  Substance and Sexual Activity   Alcohol use: Yes    Comment: rare   Drug use: No   Sexual activity: Yes  Other Topics Concern   Not on file  Social History Narrative   Not on file   Social Drivers of Health   Financial Resource Strain: Low Risk  (02/14/2024)   Overall Financial Resource Strain (CARDIA)    Difficulty of Paying Living Expenses: Not hard at all  Food Insecurity: No Food Insecurity (02/14/2024)   Hunger Vital Sign    Worried About Running Out of  Food in the Last Year: Never true    Ran Out of Food in the Last Year: Never true  Transportation Needs: No Transportation Needs (02/14/2024)   PRAPARE - Administrator, Civil Service (Medical): No    Lack of Transportation (Non-Medical): No  Physical Activity: Sufficiently Active (02/14/2024)   Exercise Vital Sign    Days of Exercise per Week: 5 days    Minutes of Exercise per Session: 30 min  Stress: No Stress Concern Present (02/14/2024)   [employer] of Occupational Health - Occupational Stress Questionnaire    Feeling of Stress : Not at all  Social Connections: Moderately Integrated (02/14/2024)   Social Connection and Isolation Panel [NHANES]    Frequency of Communication with Friends and Family: Three  times a week    Frequency of Social Gatherings with Friends and Family: More than three times a week    Attends Religious Services: 1 to 4 times per year    Active Member of [employer] or Organizations: Yes    Attends Engineer, structural: More than 4 times per year    Marital Status: Divorced    Tobacco Counseling Counseling given: Not Answered   Clinical Intake:  Pre-visit preparation completed: Yes  Pain : No/denies pain     Diabetes: No  How often do you need to have someone help you when you read instructions, pamphlets, or other written materials from your doctor or pharmacy?: 1 - Never  Interpreter Needed?: No  Information entered by :: Remi Haggard LPN   Activities of Daily Living    02/14/2024   11:08 AM 02/10/2024    9:16 AM  In your present state of health, do you have any difficulty performing the following activities:  Hearing? 0 0  Vision? 0 0  Difficulty concentrating or making decisions? 0 0  Walking or climbing stairs? 0 0  Dressing or bathing? 0 0  Doing errands, shopping? 0 0  Preparing Food and eating ? N N  Using the Toilet? N N  In the past six months, have you accidently leaked urine? N N  Do you have problems with  loss of bowel control? N N  Managing your Medications? N N  Managing your Finances? N N  Housekeeping or managing your Housekeeping? N N    Patient Care Team: [place], Buren Kos, MD as PCP - General (Family Medicine)  Indicate any recent Medical Services you may have received from other than [facility] providers in the past year (date may be approximate).     Assessment:   This is a routine wellness examination for Florien.  Hearing/Vision screen Hearing Screening - Comments:: No trouble hearing Vision Screening - Comments:: [facility] Up to date   Goals Addressed             This Visit's Progress    Patient Stated       Stay healthy        Depression Screen    02/14/2024   10:53 AM 02/10/2024    9:59 AM 10/04/2023    2:32 PM 07/20/2023    3:53 PM 02/22/2023    1:45 PM 02/08/2023   10:38 AM 09/23/2022   10:11 AM  PHQ 2/9 Scores  PHQ - 2 Score 0 0 0 0 0 0 0  PHQ- 9 Score 2 0 0 2   1  Exception Documentation     Medical reason  Medical reason    Fall Risk    02/10/2024    9:59 AM 02/10/2024    9:16 AM 10/04/2023    2:32 PM 02/22/2023    1:45 PM 02/08/2023   10:40 AM  Fall Risk   Falls in the past year? 0 0 0 0 0  Number falls in past yr: 0  0 0 0  Injury with Fall? 0  0 0 0  Risk for fall due to : No Fall Risks  No Fall Risks No Fall Risks No Fall Risks  Follow up Falls evaluation completed  Falls evaluation completed Falls evaluation completed Falls prevention discussed    MEDICARE RISK AT HOME: Medicare Risk at Home Any stairs in or around the home?: No Home free of loose throw rugs in walkways, pet beds, electrical cords, etc?: Yes  Adequate lighting in your home to reduce risk of falls?: Yes Life alert?: No Use of a cane, walker or w/c?: No Grab bars in the bathroom?: No Shower chair or bench in shower?: No Elevated toilet seat or a handicapped toilet?: No  TIMED UP AND GO:  Was the test performed?  No    Cognitive Function:         02/14/2024   10:52 AM 02/14/2024   10:51 AM 02/08/2023   10:41 AM  6CIT Screen  What Year? 0 points 0 points 0 points  What month? 0 points 0 points 0 points  What time? 0 points 0 points 0 points  Count back from 20 0 points 0 points 0 points  Months in reverse 0 points 0 points 0 points  Repeat phrase 0 points 0 points 0 points  Total Score 0 points 0 points 0 points    Immunizations Immunization History  Administered Date(s) Administered   Fluad Quad(high Dose 65+) 08/15/2019, 08/13/2020, 09/24/2021, 08/16/2022   Fluad Trivalent(High Dose 65+) 08/08/2023   Influenza, High Dose Seasonal PF 08/15/2018   Influenza,inj,quad, With Preservative 10/17/2013, 08/30/2014, 08/30/2015, 10/14/2016, 08/17/2017   PFIZER Comirnaty(Gray Top)Covid-19 Tri-Sucrose Vaccine 03/25/2021   PFIZER(Purple Top)SARS-COV-2 Vaccination 12/23/2019, 01/14/2020, 07/22/2020   Pfizer(Comirnaty)Fall Seasonal Vaccine 12 years and older 09/23/2022, 08/30/2023   Pneumococcal Conjugate-13 05/19/2015, 01/06/2018   Pneumococcal Polysaccharide-23 11/29/2009, 12/13/2018   Respiratory Syncytial Virus Vaccine,Recomb Aduvanted(Arexvy) 11/15/2022   Tdap 05/19/2015   Zoster Recombinant(Shingrix) 06/19/2019, 09/12/2019   Zoster, Live 11/30/2011    TDAP status: Up to date  Flu Vaccine status: Up to date  Pneumococcal vaccine status: Up to date  Covid-19 vaccine status: Information provided on how to obtain vaccines.   Qualifies for Shingles Vaccine? No   Zostavax completed Yes   Shingrix Completed?: Yes  Screening Tests Health Maintenance  Topic Date Due   COVID-19 Vaccine (7 - Pfizer risk 2024-25 season) 02/28/2024   Medicare Annual Wellness (AWV)  02/13/2025   DTaP/Tdap/Td (2 - Td or Tdap) 05/18/2025   Pneumonia Vaccine 64+ Years old  Completed   INFLUENZA VACCINE  Completed   Hepatitis C Screening  Completed   Zoster Vaccines- Shingrix  Completed   HPV VACCINES  Aged Out   Colonoscopy  Discontinued     Health Maintenance  There are no preventive care reminders to display for this patient.   Colorectal cancer screening: No longer required.   Lung Cancer Screening: (Low Dose CT Chest recommended if Age 51-80 years, 20 pack-year currently smoking OR have quit w/in 15years.) does not qualify.   Lung Cancer Screening Referral:   Additional Screening:  Hepatitis C Screening: does not qualify; Completed 2019  Vision Screening: Recommended annual ophthalmology exams for early detection of glaucoma and other disorders of the eye. Is the patient up to date with their annual eye exam?  Yes  Who is the provider or what is the name of the office in which the patient attends annual eye exams? [facility] If pt is not established with a provider, would they like to be referred to a provider to establish care? No .   Dental Screening: Recommended annual dental exams for proper oral hygiene   Community Resource Referral / Chronic Care Management: CRR required this visit?  No   CCM required this visit?  No     Plan:     I have personally reviewed and noted the following in the patient's chart:   Medical and social history Use of alcohol, tobacco  or illicit drugs  Current medications and supplements including opioid prescriptions. Patient is not currently taking opioid prescriptions. Functional ability and status Nutritional status Physical activity Advanced directives List of other physicians Hospitalizations, surgeries, and ER visits in previous 12 months Vitals Screenings to include cognitive, depression, and falls Referrals and appointments  In addition, I have reviewed and discussed with patient certain preventive protocols, quality metrics, and best practice recommendations. A written personalized care plan for preventive services as well as general preventive health recommendations were provided to patient.     Remi Haggard, LPN   03/07/8118   After Visit Summary:  (MyChart) Due to this being a telephonic visit, the after visit summary with patients personalized plan was offered to patient via MyChart   Nurse Notes:

## 2024-02-14 NOTE — Patient Instructions (Signed)
 [name] , Thank you for taking time to come for your Medicare Wellness Visit. I appreciate your ongoing commitment to your health goals. Please review the following plan we discussed and let me know if I can assist you in the future.   Screening recommendations/referrals: Colonoscopy  no longer required Recommended yearly ophthalmology/optometry visit for glaucoma screening and checkup Recommended yearly dental visit for hygiene and checkup  Vaccinations: Influenza vaccine: up to date Pneumococcal vaccine: up to date Tdap vaccine: up to date Shingles vaccine: up to date    Advanced directives: up to date   Preventive Care 79 Years and Older, Male Preventive care refers to lifestyle choices and visits with your health care provider that can promote health and wellness. What does preventive care include? A yearly physical exam. This is also called an annual well check. Dental exams once or twice a year. Routine eye exams. Ask your health care provider how often you should have your eyes checked. Personal lifestyle choices, including: Daily care of your teeth and gums. Regular physical activity. Eating a healthy diet. Avoiding tobacco and drug use. Limiting alcohol use. Practicing safe sex. Taking low doses of aspirin every day. Taking vitamin and mineral supplements as recommended by your health care provider. What happens during an annual well check? The services and screenings done by your health care provider during your annual well check will depend on your age, overall health, lifestyle risk factors, and family history of disease. Counseling  Your health care provider may ask you questions about your: Alcohol use. Tobacco use. Drug use. Emotional well-being. Home and relationship well-being. Sexual activity. Eating habits. History of falls. Memory and ability to understand (cognition). Work and work Astronomer. Screening  You may have the following tests or  measurements: Height, weight, and BMI. Blood pressure. Lipid and cholesterol levels. These may be checked every 5 years, or more frequently if you are over 68 years old. Skin check. Lung cancer screening. You may have this screening every year starting at age 29 if you have a 30-pack-year history of smoking and currently smoke or have quit within the past 15 years. Fecal occult blood test (FOBT) of the stool. You may have this test every year starting at age 9. Flexible sigmoidoscopy or colonoscopy. You may have a sigmoidoscopy every 5 years or a colonoscopy every 10 years starting at age 28. Prostate cancer screening. Recommendations will vary depending on your family history and other risks. Hepatitis C blood test. Hepatitis B blood test. Sexually transmitted disease (STD) testing. Diabetes screening. This is done by checking your blood sugar (glucose) after you have not eaten for a while (fasting). You may have this done every 1-3 years. Abdominal aortic aneurysm (AAA) screening. You may need this if you are a current or former smoker. Osteoporosis. You may be screened starting at age 23 if you are at high risk. Talk with your health care provider about your test results, treatment options, and if necessary, the need for more tests. Vaccines  Your health care provider may recommend certain vaccines, such as: Influenza vaccine. This is recommended every year. Tetanus, diphtheria, and acellular pertussis (Tdap, Td) vaccine. You may need a Td booster every 10 years. Zoster vaccine. You may need this after age 31. Pneumococcal 13-valent conjugate (PCV13) vaccine. One dose is recommended after age 31. Pneumococcal polysaccharide (PPSV23) vaccine. One dose is recommended after age 64. Talk to your health care provider about which screenings and vaccines you need and how often you need  them. This information is not intended to replace advice given to you by your health care provider. Make sure  you discuss any questions you have with your health care provider. Document Released: 12/12/2015 Document Revised: 08/04/2016 Document Reviewed: 09/16/2015 Elsevier Interactive Patient Education  2017 [employer].  Fall Prevention in the Home Falls can cause injuries. They can happen to people of all ages. There are many things you can do to make your home safe and to help prevent falls. What can I do on the outside of my home? Regularly fix the edges of walkways and driveways and fix any cracks. Remove anything that might make you trip as you walk through a door, such as a raised step or threshold. Trim any bushes or trees on the path to your home. Use bright outdoor lighting. Clear any walking paths of anything that might make someone trip, such as rocks or tools. Regularly check to see if handrails are loose or broken. Make sure that both sides of any steps have handrails. Any raised decks and porches should have guardrails on the edges. Have any leaves, snow, or ice cleared regularly. Use sand or salt on walking paths during winter. Clean up any spills in your garage right away. This includes oil or grease spills. What can I do in the bathroom? Use night lights. Install grab bars by the toilet and in the tub and shower. Do not use towel bars as grab bars. Use non-skid mats or decals in the tub or shower. If you need to sit down in the shower, use a plastic, non-slip stool. Keep the floor dry. Clean up any water that spills on the floor as soon as it happens. Remove soap buildup in the tub or shower regularly. Attach bath mats securely with double-sided non-slip rug tape. Do not have throw rugs and other things on the floor that can make you trip. What can I do in the bedroom? Use night lights. Make sure that you have a light by your bed that is easy to reach. Do not use any sheets or blankets that are too big for your bed. They should not hang down onto the floor. Have a firm  chair that has side arms. You can use this for support while you get dressed. Do not have throw rugs and other things on the floor that can make you trip. What can I do in the kitchen? Clean up any spills right away. Avoid walking on wet floors. Keep items that you use a lot in easy-to-reach places. If you need to reach something above you, use a strong step stool that has a grab bar. Keep electrical cords out of the way. Do not use floor polish or wax that makes floors slippery. If you must use wax, use non-skid floor wax. Do not have throw rugs and other things on the floor that can make you trip. What can I do with my stairs? Do not leave any items on the stairs. Make sure that there are handrails on both sides of the stairs and use them. Fix handrails that are broken or loose. Make sure that handrails are as long as the stairways. Check any carpeting to make sure that it is firmly attached to the stairs. Fix any carpet that is loose or worn. Avoid having throw rugs at the top or bottom of the stairs. If you do have throw rugs, attach them to the floor with carpet tape. Make sure that you have a light switch at  the top of the stairs and the bottom of the stairs. If you do not have them, ask someone to add them for you. What else can I do to help prevent falls? Wear shoes that: Do not have high heels. Have rubber bottoms. Are comfortable and fit you well. Are closed at the toe. Do not wear sandals. If you use a stepladder: Make sure that it is fully opened. Do not climb a closed stepladder. Make sure that both sides of the stepladder are locked into place. Ask someone to hold it for you, if possible. Clearly mark and make sure that you can see: Any grab bars or handrails. First and last steps. Where the edge of each step is. Use tools that help you move around (mobility aids) if they are needed. These include: Canes. Walkers. Scooters. Crutches. Turn on the lights when you go  into a dark area. Replace any light bulbs as soon as they burn out. Set up your furniture so you have a clear path. Avoid moving your furniture around. If any of your floors are uneven, fix them. If there are any pets around you, be aware of where they are. Review your medicines with your doctor. Some medicines can make you feel dizzy. This can increase your chance of falling. Ask your doctor what other things that you can do to help prevent falls. This information is not intended to replace advice given to you by your health care provider. Make sure you discuss any questions you have with your health care provider. Document Released: 09/11/2009 Document Revised: 04/22/2016 Document Reviewed: 12/20/2014 Elsevier Interactive Patient Education  2017 [employer].

## 2024-02-15 ENCOUNTER — Other Ambulatory Visit: Payer: Self-pay

## 2024-03-08 ENCOUNTER — Inpatient Hospital Stay: Payer: MEDICARE | Attending: Oncology

## 2024-03-08 ENCOUNTER — Encounter: Payer: Self-pay | Admitting: *Deleted

## 2024-03-08 DIAGNOSIS — Z7901 Long term (current) use of anticoagulants: Secondary | ICD-10-CM | POA: Insufficient documentation

## 2024-03-08 DIAGNOSIS — C9110 Chronic lymphocytic leukemia of B-cell type not having achieved remission: Secondary | ICD-10-CM | POA: Insufficient documentation

## 2024-03-08 DIAGNOSIS — Z86718 Personal history of other venous thrombosis and embolism: Secondary | ICD-10-CM | POA: Insufficient documentation

## 2024-03-08 LAB — CBC WITH DIFFERENTIAL (CANCER CENTER ONLY)
Abs Immature Granulocytes: 0.04 10*3/uL (ref 0.00–0.07)
Basophils Absolute: 0 10*3/uL (ref 0.0–0.1)
Basophils Relative: 0 %
Eosinophils Absolute: 0.6 10*3/uL — ABNORMAL HIGH (ref 0.0–0.5)
Eosinophils Relative: 8 %
HCT: 40.3 % (ref 39.0–52.0)
Hemoglobin: 13.5 g/dL (ref 13.0–17.0)
Immature Granulocytes: 1 %
Lymphocytes Relative: 28 %
Lymphs Abs: 1.9 10*3/uL (ref 0.7–4.0)
MCH: 31.8 pg (ref 26.0–34.0)
MCHC: 33.5 g/dL (ref 30.0–36.0)
MCV: 95 fL (ref 80.0–100.0)
Monocytes Absolute: 0.7 10*3/uL (ref 0.1–1.0)
Monocytes Relative: 10 %
Neutro Abs: 3.6 10*3/uL (ref 1.7–7.7)
Neutrophils Relative %: 53 %
Platelet Count: 100 10*3/uL — ABNORMAL LOW (ref 150–400)
RBC: 4.24 MIL/uL (ref 4.22–5.81)
RDW: 12.6 % (ref 11.5–15.5)
WBC Count: 6.7 10*3/uL (ref 4.0–10.5)
nRBC: 0 % (ref 0.0–0.2)

## 2024-03-08 LAB — PROTIME-INR
INR: 1.8 — ABNORMAL HIGH (ref 0.8–1.2)
Prothrombin Time: 21 s — ABNORMAL HIGH (ref 11.4–15.2)

## 2024-03-30 ENCOUNTER — Ambulatory Visit (HOSPITAL_BASED_OUTPATIENT_CLINIC_OR_DEPARTMENT_OTHER): Payer: MEDICARE | Admitting: Family Medicine

## 2024-04-02 ENCOUNTER — Ambulatory Visit (HOSPITAL_BASED_OUTPATIENT_CLINIC_OR_DEPARTMENT_OTHER): Payer: MEDICARE | Admitting: Family Medicine

## 2024-04-02 ENCOUNTER — Inpatient Hospital Stay (HOSPITAL_BASED_OUTPATIENT_CLINIC_OR_DEPARTMENT_OTHER): Payer: MEDICARE | Admitting: Oncology

## 2024-04-02 ENCOUNTER — Inpatient Hospital Stay: Payer: MEDICARE | Attending: Oncology

## 2024-04-02 VITALS — BP 131/77 | HR 73 | Temp 98.2°F | Resp 18 | Wt 192.2 lb

## 2024-04-02 DIAGNOSIS — Z7901 Long term (current) use of anticoagulants: Secondary | ICD-10-CM

## 2024-04-02 DIAGNOSIS — C9110 Chronic lymphocytic leukemia of B-cell type not having achieved remission: Secondary | ICD-10-CM | POA: Diagnosis present

## 2024-04-02 LAB — CBC WITH DIFFERENTIAL (CANCER CENTER ONLY)
Abs Immature Granulocytes: 0.05 10*3/uL (ref 0.00–0.07)
Basophils Absolute: 0 10*3/uL (ref 0.0–0.1)
Basophils Relative: 1 %
Eosinophils Absolute: 0.4 10*3/uL (ref 0.0–0.5)
Eosinophils Relative: 6 %
HCT: 39.4 % (ref 39.0–52.0)
Hemoglobin: 13.3 g/dL (ref 13.0–17.0)
Immature Granulocytes: 1 %
Lymphocytes Relative: 31 %
Lymphs Abs: 2 10*3/uL (ref 0.7–4.0)
MCH: 31.7 pg (ref 26.0–34.0)
MCHC: 33.8 g/dL (ref 30.0–36.0)
MCV: 94 fL (ref 80.0–100.0)
Monocytes Absolute: 0.6 10*3/uL (ref 0.1–1.0)
Monocytes Relative: 9 %
Neutro Abs: 3.6 10*3/uL (ref 1.7–7.7)
Neutrophils Relative %: 52 %
Platelet Count: 103 10*3/uL — ABNORMAL LOW (ref 150–400)
RBC: 4.19 MIL/uL — ABNORMAL LOW (ref 4.22–5.81)
RDW: 12.7 % (ref 11.5–15.5)
WBC Count: 6.7 10*3/uL (ref 4.0–10.5)
nRBC: 0 % (ref 0.0–0.2)

## 2024-04-02 LAB — PROTIME-INR
INR: 1.8 — ABNORMAL HIGH (ref 0.8–1.2)
Prothrombin Time: 21.2 s — ABNORMAL HIGH (ref 11.4–15.2)

## 2024-04-02 NOTE — Progress Notes (Signed)
 [facility] OFFICE PROGRESS NOTE   Diagnosis: CLL, chronic anticoagulation therapy  INTERVAL HISTORY:   Chad Gross returns as scheduled.  He feels well.  Good appetite.  No fever or night sweats.  There is a persistent abdominal mass.  No pain or nausea.  He continues Coumadin  anticoagulation.  He is followed by dermatology for multiple skin cancers.  He recently had lesions removed from the right face and left calf.  Objective:  Vital signs in last 24 hours:  Blood pressure 131/77, pulse 73, temperature 98.2 F (36.8 C), temperature source Temporal, resp. rate 18, weight 192 lb 3.2 oz (87.2 kg), SpO2 100%.    HEENT: Neck without mass Lymphatics: No cervical, supraclavicular, axillary, or inguinal nodes Resp: Lungs clear bilaterally Cardio: Regular rate and rhythm GI: No hepatosplenomegaly, firm masslike fullness in the upper abdomen centered to the left of midline Vascular: No leg edema, the right lower leg is larger than the left side  Skin: No evidence of recurrent tumor at the forehead  Lab Results:  Lab Results  Component Value Date   WBC 6.7 04/02/2024   HGB 13.3 04/02/2024   HCT 39.4 04/02/2024   MCV 94.0 04/02/2024   PLT 103 (L) 04/02/2024   NEUTROABS 3.6 04/02/2024    CMP  Lab Results  Component Value Date   NA 134 (L) 10/03/2023   K 3.8 10/03/2023   CL 100 10/03/2023   CO2 31 10/03/2023   GLUCOSE 90 10/03/2023   BUN 23 10/03/2023   CREATININE 1.39 (H) 10/03/2023   CALCIUM 9.7 10/03/2023   PROT 6.9 10/03/2023   ALBUMIN 4.2 10/03/2023   AST 17 10/03/2023   ALT 8 10/03/2023   ALKPHOS 36 (L) 10/03/2023   BILITOT 0.5 10/03/2023   GFRNONAA 52 (L) 10/03/2023   GFRAA 52 (L) 08/13/2020    No results found for: "CEA1", "CEA", "ZOX096", "CA125"  Lab Results  Component Value Date   INR 1.8 (H) 04/02/2024   LABPROT 21.2 (H) 04/02/2024    Imaging:  No results found.  Medications: I have reviewed the patient's current  medications.   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 He will return for a monthly PT check.  Indwelling IVC catheter.  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        4. 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 erectile dysfunction-followed by Dr. Inga Manges , status post placement of a penile implant at [facility] in April 2014            7. CT of the abdomen/pelvis at [facility] urology February 2014 for evaluation of hematuria-12 mm external iliac nodes and left periaortic retroperitoneal lymph node-11 mm-potentially related to CLL      8.  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, peripheral lymphocytosis and small palpable lymph nodes          CT abdomen/pelvis 09/18/2019-extensive abdominal pelvic lymphadenopathy including a left periaortic node measuring 5.7 cm 08/13/2020 CLL FISH panel-no evidence of trisomy 12; no evidence of D13S319; no evidence of p53 deletion or amplification; deletion of ATM present 11/05/2020-peripheral blood TP53 mutation-negative Cycle 1 bendamustine /Rituxan  10/08/2020, 10/09/2020 Cycle 2 bendamustine /Rituxan  11/05/2020, 11/06/2020 Cycle 3 bendamustine /Rituxan  12/03/2020, 12/04/2020 Cycle 4 bendamustine /Rituxan  12/31/2020, 01/01/2021 CTs 01/19/2021-decrease in bulky abdomen/pelvic lymphadenopathy with more widespread ill-defined "haziness "in the mesentery and retroperitoneal fat, decreased pelvic lymphadenopathy Cycle  5 bendamustine /Rituxan  01/28/2021, 01/29/2021 Cycle 6 bendamustine /rituximab  02/25/2021, 02/26/2021 CT 02/22/2023-mild generalized lymphadenopathy in the neck, significant increase in chest andabdominal/pelvic lymph nodes, dominant nodal mass in the mesentery CT 06/25/2023-progressive lymphadenopathy in the abdomen and pelvis, dominant confluent mass in the upper  abdomen/retroperitoneum has enlarged, stable small nodes in the chest Cycle 1 Bendamustine /Rituxan  07/07/2023, 07/08/2023 Cycle 2 Bendamustine /Rituxan  08/08/2023, 08/09/2023 CT abdomen/pelvis 08/30/2023-bulky matted lymphadenopathy and soft tissue mass encasing retroperitoneal structures and extending into the small bowel mesentery slightly diminished in size.  Additional matted gastrohepatic ligament, portacaval, porta hepatis, bilateral iliac and inguinal lymph nodes also slightly diminished in size.  Spleen measures smaller. Cycle 3 Bendamustine /Rituxan  09/05/2023, 09/06/2023 Cycle 4 Bendamustine /Rituxan  10/03/2023, 10/04/2023     9.  Basal cell carcinoma removed from the left superior central forehead 01/21/2020    10.  Renal insufficiency    11.  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  Indeterminate left hepatic lobe lesion on CT 02/22/2023 13.  Fever and altered mental status following cycle 2 Cemiplimab -no source for infection identified       Disposition: Chad Gross.  Stable.  He is stable from a hematologic standpoint.  He appears asymptomatic from the CLL, though there is a persistent large abdominal mass.  He does not wish to undergo repeat imaging at present.  He will call for new symptoms.  We will consider treatment options when there is clinical progression.  He continues Coumadin  anticoagulation.   The PT/INR is slightly below the therapeutic range.  He will continue Coumadin  at current dose.  He will return for PT/INR and dose adjustment as indicated in 1 month.  He will return for an office visit in 3 months.  Coni Deep, MD  04/02/2024  10:46 AM

## 2024-04-03 ENCOUNTER — Telehealth: Payer: Self-pay | Admitting: Oncology

## 2024-04-03 NOTE — Telephone Encounter (Signed)
 Patient has been scheduled for follow-up visit per 04/02/24 LOS.  LVM notifying pt of appt details, provided my direct number to pt if appt changes need to be made.

## 2024-04-10 ENCOUNTER — Encounter (HOSPITAL_BASED_OUTPATIENT_CLINIC_OR_DEPARTMENT_OTHER): Payer: Self-pay | Admitting: *Deleted

## 2024-04-16 ENCOUNTER — Ambulatory Visit (HOSPITAL_BASED_OUTPATIENT_CLINIC_OR_DEPARTMENT_OTHER): Payer: MEDICARE | Admitting: Family Medicine

## 2024-04-18 ENCOUNTER — Ambulatory Visit (HOSPITAL_BASED_OUTPATIENT_CLINIC_OR_DEPARTMENT_OTHER): Payer: MEDICARE | Admitting: Family Medicine

## 2024-04-25 ENCOUNTER — Ambulatory Visit (HOSPITAL_BASED_OUTPATIENT_CLINIC_OR_DEPARTMENT_OTHER): Payer: MEDICARE | Admitting: Family Medicine

## 2024-05-03 ENCOUNTER — Inpatient Hospital Stay: Payer: MEDICARE | Attending: Oncology

## 2024-05-03 DIAGNOSIS — C9110 Chronic lymphocytic leukemia of B-cell type not having achieved remission: Secondary | ICD-10-CM | POA: Diagnosis present

## 2024-05-03 DIAGNOSIS — Z7901 Long term (current) use of anticoagulants: Secondary | ICD-10-CM

## 2024-05-03 LAB — CBC WITH DIFFERENTIAL (CANCER CENTER ONLY)
Abs Immature Granulocytes: 0.05 10*3/uL (ref 0.00–0.07)
Basophils Absolute: 0 10*3/uL (ref 0.0–0.1)
Basophils Relative: 1 %
Eosinophils Absolute: 0.4 10*3/uL (ref 0.0–0.5)
Eosinophils Relative: 6 %
HCT: 39 % (ref 39.0–52.0)
Hemoglobin: 13.3 g/dL (ref 13.0–17.0)
Immature Granulocytes: 1 %
Lymphocytes Relative: 41 %
Lymphs Abs: 2.8 10*3/uL (ref 0.7–4.0)
MCH: 31.8 pg (ref 26.0–34.0)
MCHC: 34.1 g/dL (ref 30.0–36.0)
MCV: 93.3 fL (ref 80.0–100.0)
Monocytes Absolute: 0.6 10*3/uL (ref 0.1–1.0)
Monocytes Relative: 9 %
Neutro Abs: 2.8 10*3/uL (ref 1.7–7.7)
Neutrophils Relative %: 42 %
Platelet Count: 93 10*3/uL — ABNORMAL LOW (ref 150–400)
RBC: 4.18 MIL/uL — ABNORMAL LOW (ref 4.22–5.81)
RDW: 12.6 % (ref 11.5–15.5)
WBC Count: 6.7 10*3/uL (ref 4.0–10.5)
nRBC: 0 % (ref 0.0–0.2)

## 2024-05-03 LAB — PROTIME-INR
INR: 1.8 — ABNORMAL HIGH (ref 0.8–1.2)
Prothrombin Time: 21.3 s — ABNORMAL HIGH (ref 11.4–15.2)

## 2024-05-15 ENCOUNTER — Encounter (HOSPITAL_BASED_OUTPATIENT_CLINIC_OR_DEPARTMENT_OTHER): Payer: Self-pay | Admitting: Family Medicine

## 2024-05-15 ENCOUNTER — Encounter: Payer: Self-pay | Admitting: Oncology

## 2024-05-15 ENCOUNTER — Ambulatory Visit (INDEPENDENT_AMBULATORY_CARE_PROVIDER_SITE_OTHER): Payer: MEDICARE | Admitting: Family Medicine

## 2024-05-15 ENCOUNTER — Other Ambulatory Visit (HOSPITAL_BASED_OUTPATIENT_CLINIC_OR_DEPARTMENT_OTHER): Payer: Self-pay

## 2024-05-15 VITALS — BP 118/82 | HR 88 | Ht 71.0 in | Wt 186.1 lb

## 2024-05-15 DIAGNOSIS — L259 Unspecified contact dermatitis, unspecified cause: Secondary | ICD-10-CM | POA: Insufficient documentation

## 2024-05-15 DIAGNOSIS — L255 Unspecified contact dermatitis due to plants, except food: Secondary | ICD-10-CM | POA: Diagnosis not present

## 2024-05-15 DIAGNOSIS — M159 Polyosteoarthritis, unspecified: Secondary | ICD-10-CM | POA: Insufficient documentation

## 2024-05-15 MED ORDER — TRIAMCINOLONE ACETONIDE 0.1 % EX CREA
1.0000 | TOPICAL_CREAM | Freq: Two times a day (BID) | CUTANEOUS | 0 refills | Status: DC | PRN
  Filled 2024-05-15: qty 30, 30d supply, fill #0

## 2024-05-15 MED ORDER — PREDNISONE 10 MG PO TABS
ORAL_TABLET | ORAL | 0 refills | Status: AC
  Filled 2024-05-15: qty 40, 16d supply, fill #0

## 2024-05-15 NOTE — Telephone Encounter (Signed)
 Patient called and made an appt for 6/17

## 2024-05-15 NOTE — Telephone Encounter (Signed)
 Spoke with patient.

## 2024-05-15 NOTE — Assessment & Plan Note (Signed)
 Patient reports that a couple days ago, he had some potential exposure to foliage and has subsequently developed rash primarily over both hands and distal forearms.  He has had itching, some discomfort associated with this.  He has also had lesions develop with 1 lesion breaking recently and having some oozing.  Oozing was predominantly clear fluid.  He has had some mild feeling of unwell.  Has not had any difficulty with speaking, breathing, swallowing.  Has been trying some OTC measures including oral antihistamine. On exam, patient is in no acute distress, vital signs stable.  He is able to speak in complete sentences, no increased work of breathing or trouble breathing.  Over both hands, patient does have some mild redness as well as fluid-filled lesions scattered.  These 2 predominantly have erythematous base.  He does have some clear liquid oozing from one lesion.  He does have full range of motion of bilateral hands.  No pitting edema present. Suspect contact dermatitis.  Discussed options.  Given degree of symptoms, can proceed with steroid taper as well as use of topical steroid to try and help with controlling symptoms.  Can continue with oral antihistamine as well.  Cautioned on potential side effects with medications prescribed.  He does have appointment scheduled in about 2 weeks, we will keep this appointment and can follow-up on progress

## 2024-05-15 NOTE — Progress Notes (Signed)
    Procedures performed today:    None.  Independent interpretation of notes and tests performed by another provider:   None.  Brief History, Exam, Impression, and Recommendations:    BP 118/82 (BP Location: Right Arm, Patient Position: Sitting, Cuff Size: Normal)   Pulse 88   Ht 5' 11 (1.803 m)   Wt 186 lb 1.6 oz (84.4 kg)   SpO2 98%   BMI 25.96 kg/m   Contact dermatitis due to plants, except food, unspecified contact dermatitis type Assessment & Plan: Patient reports that a couple days ago, he had some potential exposure to foliage and has subsequently developed rash primarily over both hands and distal forearms.  He has had itching, some discomfort associated with this.  He has also had lesions develop with 1 lesion breaking recently and having some oozing.  Oozing was predominantly clear fluid.  He has had some mild feeling of unwell.  Has not had any difficulty with speaking, breathing, swallowing.  Has been trying some OTC measures including oral antihistamine. On exam, patient is in no acute distress, vital signs stable.  He is able to speak in complete sentences, no increased work of breathing or trouble breathing.  Over both hands, patient does have some mild redness as well as fluid-filled lesions scattered.  These 2 predominantly have erythematous base.  He does have some clear liquid oozing from one lesion.  He does have full range of motion of bilateral hands.  No pitting edema present. Suspect contact dermatitis.  Discussed options.  Given degree of symptoms, can proceed with steroid taper as well as use of topical steroid to try and help with controlling symptoms.  Can continue with oral antihistamine as well.  Cautioned on potential side effects with medications prescribed.  He does have appointment scheduled in about 2 weeks, we will keep this appointment and can follow-up on progress   Other orders -     predniSONE ; Take 4 tablets (40 mg total) by mouth daily with  breakfast for 4 days, THEN 3 tablets (30 mg total) daily with breakfast for 4 days, THEN 2 tablets (20 mg total) daily with breakfast for 4 days, THEN 1 tablet (10 mg total) daily with breakfast for 4 days.  Dispense: 40 tablet; Refill: 0 -     Triamcinolone  Acetonide; Apply 1 Application topically 2 (two) times daily as needed.  Dispense: 30 g; Refill: 0   ___________________________________________ Dessa Ledee de [place], MD, ABFM, CAQSM Primary Care and Sports Medicine [facility]

## 2024-05-15 NOTE — Patient Instructions (Signed)
?  Medication Instructions:  ?Your physician recommends that you continue on your current medications as directed. Please refer to the Current Medication list given to you today. ?--If you need a refill on any your medications before your next appointment, please call your pharmacy first. If no refills are authorized on file call the office.-- ? ? ? ? ?Follow-Up: ?Your next appointment:   ?Your physician recommends that you schedule a follow-up appointment in: keep appointment with Dr. de [place] ? ?You will receive a text message or e-mail with a link to a survey about your care and experience with [place] today! We would greatly appreciate your feedback!  ? ?Thanks for letting us be apart of your health journey!!  ?Primary Care and Sports Medicine  ? ?Dr. Kyung Rudd de [place]  ? ?We encourage you to activate your patient portal called "MyChart".  Sign up information is provided on this After Visit Summary.  MyChart is used to connect with patients for Virtual Visits (Telemedicine).  Patients are able to view lab/test results, encounter notes, upcoming appointments, etc.  Non-urgent messages can be sent to your provider as well. To learn more about what you can do with MyChart, please visit --  [email or web address].   ? ?

## 2024-05-28 ENCOUNTER — Ambulatory Visit (HOSPITAL_BASED_OUTPATIENT_CLINIC_OR_DEPARTMENT_OTHER): Payer: MEDICARE | Admitting: Family Medicine

## 2024-06-04 ENCOUNTER — Inpatient Hospital Stay: Payer: MEDICARE | Attending: Oncology

## 2024-06-04 ENCOUNTER — Ambulatory Visit: Payer: Self-pay | Admitting: Oncology

## 2024-06-04 DIAGNOSIS — Z7901 Long term (current) use of anticoagulants: Secondary | ICD-10-CM | POA: Insufficient documentation

## 2024-06-04 DIAGNOSIS — C9110 Chronic lymphocytic leukemia of B-cell type not having achieved remission: Secondary | ICD-10-CM | POA: Diagnosis present

## 2024-06-04 DIAGNOSIS — Z86718 Personal history of other venous thrombosis and embolism: Secondary | ICD-10-CM | POA: Diagnosis not present

## 2024-06-04 LAB — PROTIME-INR
INR: 2.4 — ABNORMAL HIGH (ref 0.8–1.2)
Prothrombin Time: 27 s — ABNORMAL HIGH (ref 11.4–15.2)

## 2024-06-04 LAB — CBC WITH DIFFERENTIAL (CANCER CENTER ONLY)
Abs Immature Granulocytes: 0.06 K/uL (ref 0.00–0.07)
Basophils Absolute: 0 K/uL (ref 0.0–0.1)
Basophils Relative: 0 %
Eosinophils Absolute: 0.3 K/uL (ref 0.0–0.5)
Eosinophils Relative: 2 %
HCT: 37.6 % — ABNORMAL LOW (ref 39.0–52.0)
Hemoglobin: 12.8 g/dL — ABNORMAL LOW (ref 13.0–17.0)
Immature Granulocytes: 0 %
Lymphocytes Relative: 66 %
Lymphs Abs: 9.4 K/uL — ABNORMAL HIGH (ref 0.7–4.0)
MCH: 32.1 pg (ref 26.0–34.0)
MCHC: 34 g/dL (ref 30.0–36.0)
MCV: 94.2 fL (ref 80.0–100.0)
Monocytes Absolute: 0.5 K/uL (ref 0.1–1.0)
Monocytes Relative: 3 %
Neutro Abs: 4.1 K/uL (ref 1.7–7.7)
Neutrophils Relative %: 29 %
Platelet Count: 76 K/uL — ABNORMAL LOW (ref 150–400)
RBC: 3.99 MIL/uL — ABNORMAL LOW (ref 4.22–5.81)
RDW: 13.3 % (ref 11.5–15.5)
WBC Count: 14.4 K/uL — ABNORMAL HIGH (ref 4.0–10.5)
nRBC: 0.2 % (ref 0.0–0.2)

## 2024-06-04 NOTE — Telephone Encounter (Signed)
-----   Message from Arley Hof sent at 06/04/2024  2:00 PM EDT ----- Please call patient, continue Coumadin  at current dose, the platelet count is slightly lower and the lymphocyte count is higher, the CLL may be active again, follow-up as scheduled, repeat CBC at  next office visit, call for bleeding  ----- Message ----- From: Rebecka, Lab In [place] Sent: 06/04/2024  11:15 AM EDT To: Arley KATHEE Hof, MD

## 2024-06-04 NOTE — Telephone Encounter (Signed)
 Patient gave verbal understanding and had no further questions or concerns

## 2024-06-08 ENCOUNTER — Encounter (HOSPITAL_BASED_OUTPATIENT_CLINIC_OR_DEPARTMENT_OTHER): Payer: Self-pay | Admitting: Family Medicine

## 2024-06-08 ENCOUNTER — Ambulatory Visit (INDEPENDENT_AMBULATORY_CARE_PROVIDER_SITE_OTHER): Payer: MEDICARE | Admitting: Family Medicine

## 2024-06-08 VITALS — BP 128/62 | HR 80 | Ht 71.0 in | Wt 194.6 lb

## 2024-06-08 DIAGNOSIS — L255 Unspecified contact dermatitis due to plants, except food: Secondary | ICD-10-CM

## 2024-06-08 MED ORDER — TRIAMCINOLONE ACETONIDE 0.1 % EX CREA: 1.0000 | TOPICAL_CREAM | Freq: Two times a day (BID) | CUTANEOUS | 0 refills | Status: AC | PRN

## 2024-06-08 NOTE — Patient Instructions (Signed)

## 2024-06-08 NOTE — Assessment & Plan Note (Signed)
 Generally this has been improved for patient.  He still has mild rash, however much improved from last visit.  Itching is largely resolved at this point as well.  He feels that medications did help with improving this. At this time, can continue with monitoring.  No change in medications today.

## 2024-06-08 NOTE — Progress Notes (Signed)
    Procedures performed today:    None.  Independent interpretation of notes and tests performed by another provider:   None.  Brief History, Exam, Impression, and Recommendations:    BP 128/62 (BP Location: Left Arm, Patient Position: Sitting, Cuff Size: Normal)   Pulse 80   Ht 5' 11 (1.803 m)   Wt 194 lb 9.6 oz (88.3 kg)   SpO2 97%   BMI 27.14 kg/m   Contact dermatitis due to plants, except food, unspecified contact dermatitis type Assessment & Plan: Generally this has been improved for patient.  He still has mild rash, however much improved from last visit.  Itching is largely resolved at this point as well.  He feels that medications did help with improving this. At this time, can continue with monitoring.  No change in medications today.   Other orders -     Triamcinolone  Acetonide; Apply 1 Application topically 2 (two) times daily as needed.  Dispense: 30 g; Refill: 0  He did have mild leukocytosis noted on recent labs.  Discussed that in some cases prednisone  can cause this, however typically this is related to an increase in neutrophils.  Labs indicated that increase in white blood cell count was more so tied to an increase in lymphocytes.  Given his history, it could be related to CLL.  He does have follow-up arranged with his oncologist, he will be having repeat labs in the near future with oncology as well.  Return in about 6 months (around 12/09/2024).   ___________________________________________ Khamani Daniely de [place], MD, ABFM, CAQSM Primary Care and Sports Medicine [facility]

## 2024-06-19 ENCOUNTER — Other Ambulatory Visit: Payer: Self-pay | Admitting: Oncology

## 2024-06-19 DIAGNOSIS — Z7901 Long term (current) use of anticoagulants: Secondary | ICD-10-CM

## 2024-07-03 ENCOUNTER — Inpatient Hospital Stay (HOSPITAL_BASED_OUTPATIENT_CLINIC_OR_DEPARTMENT_OTHER): Payer: MEDICARE | Admitting: Oncology

## 2024-07-03 ENCOUNTER — Inpatient Hospital Stay: Payer: MEDICARE | Attending: Oncology

## 2024-07-03 VITALS — BP 134/76 | HR 80 | Temp 98.1°F | Resp 18 | Ht 71.0 in | Wt 198.5 lb

## 2024-07-03 DIAGNOSIS — Z7901 Long term (current) use of anticoagulants: Secondary | ICD-10-CM | POA: Diagnosis not present

## 2024-07-03 DIAGNOSIS — C9110 Chronic lymphocytic leukemia of B-cell type not having achieved remission: Secondary | ICD-10-CM | POA: Insufficient documentation

## 2024-07-03 DIAGNOSIS — D696 Thrombocytopenia, unspecified: Secondary | ICD-10-CM | POA: Diagnosis not present

## 2024-07-03 DIAGNOSIS — N529 Male erectile dysfunction, unspecified: Secondary | ICD-10-CM | POA: Insufficient documentation

## 2024-07-03 LAB — CBC WITH DIFFERENTIAL (CANCER CENTER ONLY)
Abs Immature Granulocytes: 0.05 K/uL (ref 0.00–0.07)
Basophils Absolute: 0 K/uL (ref 0.0–0.1)
Basophils Relative: 0 %
Eosinophils Absolute: 0.3 K/uL (ref 0.0–0.5)
Eosinophils Relative: 4 %
HCT: 39.5 % (ref 39.0–52.0)
Hemoglobin: 13.4 g/dL (ref 13.0–17.0)
Immature Granulocytes: 1 %
Lymphocytes Relative: 50 %
Lymphs Abs: 4.3 K/uL — ABNORMAL HIGH (ref 0.7–4.0)
MCH: 32.3 pg (ref 26.0–34.0)
MCHC: 33.9 g/dL (ref 30.0–36.0)
MCV: 95.2 fL (ref 80.0–100.0)
Monocytes Absolute: 0.7 K/uL (ref 0.1–1.0)
Monocytes Relative: 8 %
Neutro Abs: 3.1 K/uL (ref 1.7–7.7)
Neutrophils Relative %: 37 %
Platelet Count: 94 K/uL — ABNORMAL LOW (ref 150–400)
RBC: 4.15 MIL/uL — ABNORMAL LOW (ref 4.22–5.81)
RDW: 13.2 % (ref 11.5–15.5)
WBC Count: 8.4 K/uL (ref 4.0–10.5)
nRBC: 0 % (ref 0.0–0.2)

## 2024-07-03 LAB — PROTIME-INR
INR: 2.2 — ABNORMAL HIGH (ref 0.8–1.2)
Prothrombin Time: 25.6 s — ABNORMAL HIGH (ref 11.4–15.2)

## 2024-07-03 MED ORDER — MECLIZINE HCL 12.5 MG PO TABS: 12.5000 mg | ORAL_TABLET | Freq: Three times a day (TID) | ORAL | 0 refills | Status: AC | PRN

## 2024-07-03 NOTE — Progress Notes (Signed)
 [facility] OFFICE PROGRESS NOTE   Diagnosis: CLL, anticoagulation therapy  INTERVAL HISTORY:   [name] turns as scheduled.  Good appetite.  No fever.  He has intermittent night sweats.  No palpable lymph nodes.  Objective:  Vital signs in last 24 hours:  Blood pressure 134/76, pulse 80, temperature 98.1 F (36.7 C), temperature source Temporal, resp. rate 18, height 5' 11 (1.803 m), weight 198 lb 8 oz (90 kg), SpO2 97%.     Lymphatics: No cervical, supraclavicular, axillary, or inguinal nodes Resp: Lungs clear bilaterally Cardio: Regular rate and rhythm GI: No hepatosplenomegaly, firm masslike fullness throughout the mid abdomen centered to the left of midline Vascular: The right lower leg is larger than the left side, no edema   Lab Results:  Lab Results  Component Value Date   WBC 8.4 07/03/2024   HGB 13.4 07/03/2024   HCT 39.5 07/03/2024   MCV 95.2 07/03/2024   PLT 94 (L) 07/03/2024   NEUTROABS 3.1 07/03/2024    CMP  Lab Results  Component Value Date   NA 134 (L) 10/03/2023   K 3.8 10/03/2023   CL 100 10/03/2023   CO2 31 10/03/2023   GLUCOSE 90 10/03/2023   BUN 23 10/03/2023   CREATININE 1.39 (H) 10/03/2023   CALCIUM 9.7 10/03/2023   PROT 6.9 10/03/2023   ALBUMIN 4.2 10/03/2023   AST 17 10/03/2023   ALT 8 10/03/2023   ALKPHOS 36 (L) 10/03/2023   BILITOT 0.5 10/03/2023   GFRNONAA 52 (L) 10/03/2023   GFRAA 52 (L) 08/13/2020    No results found for: CEA1, CEA, CAN199, CA125  Lab Results  Component Value Date   INR 2.2 (H) 07/03/2024   LABPROT 25.6 (H) 07/03/2024    Medications: I have reviewed the patient's current medications.   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 He will return for a monthly PT check.  Indwelling IVC catheter.  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        4. 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 erectile dysfunction-followed by Dr. Watt , status post placement of a penile implant at [facility] in April 2014            7. CT of the abdomen/pelvis at [facility] urology February 2014 for evaluation of hematuria-12 mm external iliac nodes and left periaortic retroperitoneal lymph node-11 mm-potentially related to CLL      8.  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, peripheral lymphocytosis and small palpable lymph nodes          CT abdomen/pelvis 09/18/2019-extensive abdominal pelvic lymphadenopathy including a left periaortic node measuring 5.7 cm 08/13/2020 CLL FISH panel-no evidence of trisomy 12; no evidence of D13S319; no evidence of p53 deletion or amplification; deletion of ATM present 11/05/2020-peripheral blood TP53 mutation-negative Cycle 1 bendamustine /Rituxan  10/08/2020, 10/09/2020 Cycle 2 bendamustine /Rituxan  11/05/2020, 11/06/2020 Cycle 3 bendamustine /Rituxan  12/03/2020, 12/04/2020 Cycle 4 bendamustine /Rituxan  12/31/2020, 01/01/2021 CTs 01/19/2021-decrease in bulky abdomen/pelvic lymphadenopathy with more widespread ill-defined haziness in the mesentery and retroperitoneal fat, decreased pelvic lymphadenopathy Cycle 5 bendamustine /Rituxan  01/28/2021, 01/29/2021 Cycle 6 bendamustine /rituximab  02/25/2021, 02/26/2021 CT 02/22/2023-mild generalized lymphadenopathy in the neck, significant increase in chest andabdominal/pelvic lymph nodes, dominant nodal mass in the mesentery CT 06/25/2023-progressive lymphadenopathy in the abdomen and pelvis, dominant confluent mass in the upper abdomen/retroperitoneum has enlarged, stable small nodes in the  chest Cycle 1 Bendamustine /Rituxan  07/07/2023, 07/08/2023 Cycle 2 Bendamustine /Rituxan  08/08/2023, 08/09/2023 CT abdomen/pelvis 08/30/2023-bulky matted lymphadenopathy and soft tissue mass encasing retroperitoneal structures and extending into the small bowel  mesentery slightly diminished in size.  Additional matted gastrohepatic ligament, portacaval, porta hepatis, bilateral iliac and inguinal lymph nodes also slightly diminished in size.  Spleen measures smaller. Cycle 3 Bendamustine /Rituxan  09/05/2023, 09/06/2023 Cycle 4 Bendamustine /Rituxan  10/03/2023, 10/04/2023     9.  Basal cell carcinoma removed from the left superior central forehead 01/21/2020    10.  Renal insufficiency    11.  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  Indeterminate left hepatic lobe lesion on CT 02/22/2023 13.  Fever and altered mental status following cycle 2 Cemiplimab -no source for infection identified    Disposition: Mr Hazard appears unchanged.  He is asymptomatic from the CLL.  The white count is lower compared to when he was here in July.  He reports being treated with prednisone  in July.  He has stable mild thrombocytopenia.  The plan is to continue observation.  We discussed initiating treatment with a Bruton's tyrosine kinase inhibitor if he has clinical progression of the CLL.  He continues chronic Coumadin  anticoagulation.  The PT/INR is therapeutic.  He will continue Coumadin  at the current dose.  Mr. Gappa will return for lab visit in 6 weeks and an office visit in 12 weeks.  I recommended he remain up-to-date on the influenza vaccine and obtain a pneumococcal 20 or 21  vaccine.  Arley Hof, MD  07/03/2024  12:18 PM

## 2024-07-25 ENCOUNTER — Encounter: Payer: Self-pay | Admitting: Oncology

## 2024-08-14 ENCOUNTER — Inpatient Hospital Stay: Payer: MEDICARE | Attending: Oncology

## 2024-08-14 ENCOUNTER — Other Ambulatory Visit: Payer: Self-pay | Admitting: Oncology

## 2024-08-14 ENCOUNTER — Ambulatory Visit: Payer: Self-pay | Admitting: Oncology

## 2024-08-14 DIAGNOSIS — C9110 Chronic lymphocytic leukemia of B-cell type not having achieved remission: Secondary | ICD-10-CM | POA: Insufficient documentation

## 2024-08-14 DIAGNOSIS — I82409 Acute embolism and thrombosis of unspecified deep veins of unspecified lower extremity: Secondary | ICD-10-CM | POA: Insufficient documentation

## 2024-08-14 DIAGNOSIS — Z7901 Long term (current) use of anticoagulants: Secondary | ICD-10-CM | POA: Diagnosis not present

## 2024-08-14 LAB — CBC WITH DIFFERENTIAL (CANCER CENTER ONLY)
Abs Immature Granulocytes: 0.05 K/uL (ref 0.00–0.07)
Basophils Absolute: 0 K/uL (ref 0.0–0.1)
Basophils Relative: 0 %
Eosinophils Absolute: 0.3 K/uL (ref 0.0–0.5)
Eosinophils Relative: 3 %
HCT: 36.7 % — ABNORMAL LOW (ref 39.0–52.0)
Hemoglobin: 12.5 g/dL — ABNORMAL LOW (ref 13.0–17.0)
Immature Granulocytes: 1 %
Lymphocytes Relative: 59 %
Lymphs Abs: 5.1 K/uL — ABNORMAL HIGH (ref 0.7–4.0)
MCH: 31.9 pg (ref 26.0–34.0)
MCHC: 34.1 g/dL (ref 30.0–36.0)
MCV: 93.6 fL (ref 80.0–100.0)
Monocytes Absolute: 0.4 K/uL (ref 0.1–1.0)
Monocytes Relative: 4 %
Neutro Abs: 2.8 K/uL (ref 1.7–7.7)
Neutrophils Relative %: 33 %
Platelet Count: 93 K/uL — ABNORMAL LOW (ref 150–400)
RBC: 3.92 MIL/uL — ABNORMAL LOW (ref 4.22–5.81)
RDW: 13.1 % (ref 11.5–15.5)
WBC Count: 8.5 K/uL (ref 4.0–10.5)
nRBC: 0 % (ref 0.0–0.2)

## 2024-08-14 LAB — PROTIME-INR
INR: 1.8 — ABNORMAL HIGH (ref 0.8–1.2)
Prothrombin Time: 21.4 s — ABNORMAL HIGH (ref 11.4–15.2)

## 2024-08-17 ENCOUNTER — Encounter: Payer: Self-pay | Admitting: Oncology

## 2024-08-17 NOTE — Telephone Encounter (Signed)
 Notified of stable INR/CBC. Continue same warfarin: 10 mg daily, except 12.5 mg M & Th. F/U as scheduled.

## 2024-08-17 NOTE — Telephone Encounter (Signed)
-----   Message from Arley Hof sent at 08/14/2024  4:21 PM EDT ----- Please call patient, continue Coumadin  at the same dose, CBC is stable, follow-up as scheduled  ----- Message ----- From: Interface, Lab In [place] Sent: 08/14/2024  11:49 AM EDT To: Arley KATHEE Hof, MD

## 2024-09-25 ENCOUNTER — Inpatient Hospital Stay: Payer: MEDICARE

## 2024-09-25 ENCOUNTER — Inpatient Hospital Stay: Payer: MEDICARE | Admitting: Oncology

## 2024-09-25 ENCOUNTER — Encounter: Payer: Self-pay | Admitting: *Deleted

## 2024-09-25 ENCOUNTER — Inpatient Hospital Stay: Payer: MEDICARE | Attending: Oncology

## 2024-09-25 VITALS — BP 112/65 | HR 98 | Temp 98.3°F | Resp 18 | Ht 71.0 in | Wt 192.4 lb

## 2024-09-25 DIAGNOSIS — Z86718 Personal history of other venous thrombosis and embolism: Secondary | ICD-10-CM | POA: Diagnosis not present

## 2024-09-25 DIAGNOSIS — C9110 Chronic lymphocytic leukemia of B-cell type not having achieved remission: Secondary | ICD-10-CM | POA: Diagnosis present

## 2024-09-25 DIAGNOSIS — Z23 Encounter for immunization: Secondary | ICD-10-CM | POA: Diagnosis not present

## 2024-09-25 DIAGNOSIS — Z85828 Personal history of other malignant neoplasm of skin: Secondary | ICD-10-CM | POA: Insufficient documentation

## 2024-09-25 DIAGNOSIS — Z7901 Long term (current) use of anticoagulants: Secondary | ICD-10-CM | POA: Diagnosis not present

## 2024-09-25 LAB — CBC WITH DIFFERENTIAL (CANCER CENTER ONLY)
Abs Immature Granulocytes: 0.06 K/uL (ref 0.00–0.07)
Basophils Absolute: 0 K/uL (ref 0.0–0.1)
Basophils Relative: 0 %
Eosinophils Absolute: 0.2 K/uL (ref 0.0–0.5)
Eosinophils Relative: 2 %
HCT: 38.2 % — ABNORMAL LOW (ref 39.0–52.0)
Hemoglobin: 12.8 g/dL — ABNORMAL LOW (ref 13.0–17.0)
Immature Granulocytes: 0 %
Lymphocytes Relative: 73 %
Lymphs Abs: 11 K/uL — ABNORMAL HIGH (ref 0.7–4.0)
MCH: 31.8 pg (ref 26.0–34.0)
MCHC: 33.5 g/dL (ref 30.0–36.0)
MCV: 95 fL (ref 80.0–100.0)
Monocytes Absolute: 0.8 K/uL (ref 0.1–1.0)
Monocytes Relative: 5 %
Neutro Abs: 3 K/uL (ref 1.7–7.7)
Neutrophils Relative %: 20 %
Platelet Count: 93 K/uL — ABNORMAL LOW (ref 150–400)
RBC: 4.02 MIL/uL — ABNORMAL LOW (ref 4.22–5.81)
RDW: 13.2 % (ref 11.5–15.5)
WBC Count: 15.2 K/uL — ABNORMAL HIGH (ref 4.0–10.5)
nRBC: 0 % (ref 0.0–0.2)

## 2024-09-25 LAB — CMP (CANCER CENTER ONLY)
ALT: 12 U/L (ref 0–44)
AST: 26 U/L (ref 15–41)
Albumin: 4.2 g/dL (ref 3.5–5.0)
Alkaline Phosphatase: 59 U/L (ref 38–126)
Anion gap: 11 (ref 5–15)
BUN: 29 mg/dL — ABNORMAL HIGH (ref 8–23)
CO2: 27 mmol/L (ref 22–32)
Calcium: 11.4 mg/dL — ABNORMAL HIGH (ref 8.9–10.3)
Chloride: 100 mmol/L (ref 98–111)
Creatinine: 1.76 mg/dL — ABNORMAL HIGH (ref 0.61–1.24)
GFR, Estimated: 39 mL/min — ABNORMAL LOW (ref >60)
Glucose, Bld: 131 mg/dL — ABNORMAL HIGH (ref 70–99)
Potassium: 4.3 mmol/L (ref 3.5–5.1)
Sodium: 137 mmol/L (ref 135–145)
Total Bilirubin: 0.6 mg/dL (ref 0.0–1.2)
Total Protein: 8.6 g/dL — ABNORMAL HIGH (ref 6.5–8.1)

## 2024-09-25 LAB — LACTATE DEHYDROGENASE: LDH: 199 U/L — ABNORMAL HIGH (ref 98–192)

## 2024-09-25 LAB — PROTIME-INR
INR: 1.8 — ABNORMAL HIGH (ref 0.8–1.2)
Prothrombin Time: 21.9 s — ABNORMAL HIGH (ref 11.4–15.2)

## 2024-09-25 MED ORDER — INFLUENZA VAC SPLIT HIGH-DOSE 0.5 ML IM SUSY
0.5000 mL | PREFILLED_SYRINGE | Freq: Once | INTRAMUSCULAR | Status: AC
  Administered 2024-09-25: 0.5 mL via INTRAMUSCULAR
  Filled 2024-09-25: qty 0.5

## 2024-09-25 NOTE — Progress Notes (Signed)
 Chad Gross OFFICE PROGRESS NOTE   Diagnosis: CLL, chronic anticoagulation  INTERVAL HISTORY:   [name] returns as scheduled.  He continues Coumadin  anticoagulation.  He reports a cough, sinus drainage, hoarseness dizziness for the past several weeks.  He has mild dyspnea.  No fever.  He has sweats in the afternoon and evening.  No change in the abdominal mass.  Objective:  Vital signs in last 24 hours:  Blood pressure 112/65, pulse 98, temperature 98.3 F (36.8 C), temperature source Temporal, resp. rate 18, height 5' 11 (1.803 m), weight 192 lb 6.4 oz (87.3 kg), SpO2 92%.    HEENT: Pharynx without erythema or exudate Lymphatics: No cervical, supraclavicular, axillary, or inguinal nodes Resp: Lungs clear bilaterally Cardio: Regular rate and rhythm GI: No hepatosplenomegaly, firm masslike fullness with associated tenderness superior to the umbilicus and in the left greater than right upper abdomen Vascular: No leg edema, the right lower leg is larger than the left side  Skin: Multiple eschars at biopsy sites over the face   Lab Results:  Lab Results  Component Value Date   WBC 15.2 (H) 09/25/2024   HGB 12.8 (L) 09/25/2024   HCT 38.2 (L) 09/25/2024   MCV 95.0 09/25/2024   PLT 93 (L) 09/25/2024   NEUTROABS PENDING 09/25/2024    CMP  Lab Results  Component Value Date   NA 134 (L) 10/03/2023   K 3.8 10/03/2023   CL 100 10/03/2023   CO2 31 10/03/2023   GLUCOSE 90 10/03/2023   BUN 23 10/03/2023   CREATININE 1.39 (H) 10/03/2023   CALCIUM 9.7 10/03/2023   PROT 6.9 10/03/2023   ALBUMIN 4.2 10/03/2023   AST 17 10/03/2023   ALT 8 10/03/2023   ALKPHOS 36 (L) 10/03/2023   BILITOT 0.5 10/03/2023   GFRNONAA 52 (L) 10/03/2023   GFRAA 52 (L) 08/13/2020     Lab Results  Component Value Date   INR 1.8 (H) 09/25/2024   LABPROT 21.9 (H) 09/25/2024     Medications: I have reviewed the patient's current medications.   Assessment/Plan: History  of recurrent venous thromboembolism, maintained on indefinite Coumadin  anticoagulation. He will return for a monthly PT check.  Indwelling IVC catheter.  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        4. 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 erectile dysfunction-followed by Dr. Watt , status post placement of a penile implant at [facility] in April 2014            7. CT of the abdomen/pelvis at [facility] February 2014 for evaluation of hematuria-12 mm external iliac nodes and left periaortic retroperitoneal lymph node-11 mm-potentially related to CLL      8.  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, peripheral lymphocytosis and small palpable lymph nodes          CT abdomen/pelvis 09/18/2019-extensive abdominal pelvic lymphadenopathy including a left periaortic node measuring 5.7 cm 08/13/2020 CLL FISH panel-no evidence of trisomy 12; no evidence of [id number]; no evidence of p53 deletion or amplification; deletion of ATM present 11/05/2020-peripheral blood TP53 mutation-negative Cycle 1 bendamustine /Rituxan  10/08/2020, 10/09/2020 Cycle 2 bendamustine /Rituxan  11/05/2020, 11/06/2020 Cycle 3 bendamustine /Rituxan  12/03/2020, 12/04/2020 Cycle 4 bendamustine /Rituxan  12/31/2020, 01/01/2021 CTs 01/19/2021-decrease in bulky abdomen/pelvic lymphadenopathy with more widespread ill-defined haziness in the mesentery and retroperitoneal fat, decreased pelvic lymphadenopathy Cycle 5 bendamustine /Rituxan  01/28/2021, 01/29/2021 Cycle 6 bendamustine /rituximab   02/25/2021, 02/26/2021 CT 02/22/2023-mild generalized lymphadenopathy in the neck, significant increase in chest andabdominal/pelvic lymph nodes, dominant nodal mass in the mesentery CT 06/25/2023-progressive lymphadenopathy in the abdomen and pelvis, dominant confluent mass in the upper abdomen/retroperitoneum has enlarged, stable small nodes in the  chest Cycle 1 Bendamustine /Rituxan  07/07/2023, 07/08/2023 Cycle 2 Bendamustine /Rituxan  08/08/2023, 08/09/2023 CT abdomen/pelvis 08/30/2023-bulky matted lymphadenopathy and soft tissue mass encasing retroperitoneal structures and extending into the small bowel mesentery slightly diminished in size.  Additional matted gastrohepatic ligament, portacaval, porta hepatis, bilateral iliac and inguinal lymph nodes also slightly diminished in size.  Spleen measures smaller. Cycle 3 Bendamustine /Rituxan  09/05/2023, 09/06/2023 Cycle 4 Bendamustine /Rituxan  10/03/2023, 10/04/2023     9.  Basal cell carcinoma removed from the left superior central forehead 01/21/2020    10.  Renal insufficiency    11.  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  Indeterminate left hepatic lobe lesion on CT 02/22/2023 13.  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  He appears to be recovering from a viral upper respiratory infection.  His weight is down in the abdominal mass appears larger.  He will undergo a restaging CT evaluation prior to an office visit in 4 weeks. He continues chronic Coumadin  anticoagulation.  He will continue Coumadin  at the current dose.  [name] received an influenza vaccine today.  He continues  follow-up with dermatology for surveillance of the squamous cell skin cancers.  Arley Hof, MD  09/25/2024  2:56 PM

## 2024-09-26 ENCOUNTER — Telehealth: Payer: Self-pay | Admitting: *Deleted

## 2024-09-26 NOTE — Progress Notes (Signed)
 Left patient VM to call back today re: labs

## 2024-09-26 NOTE — Telephone Encounter (Signed)
 Notified Mr. Winterbottom of elevated Ca+ level and to push fluids and return 10/30 or 10/31 for repeat lab. He agrees to 10/31 and lab is scheduled. Also reviewed with him it could be due to the CLL/lymphoma and symptoms of hypercalcemia. Also made him aware of CT scan date/time and prep.

## 2024-09-28 ENCOUNTER — Inpatient Hospital Stay: Payer: MEDICARE

## 2024-09-28 ENCOUNTER — Encounter: Payer: Self-pay | Admitting: Oncology

## 2024-09-28 ENCOUNTER — Telehealth: Payer: Self-pay | Admitting: Nurse Practitioner

## 2024-09-28 ENCOUNTER — Telehealth: Payer: Self-pay | Admitting: Oncology

## 2024-09-28 DIAGNOSIS — C9110 Chronic lymphocytic leukemia of B-cell type not having achieved remission: Secondary | ICD-10-CM | POA: Diagnosis not present

## 2024-09-28 LAB — CMP (CANCER CENTER ONLY)
ALT: 11 U/L (ref 0–44)
AST: 21 U/L (ref 15–41)
Albumin: 3.9 g/dL (ref 3.5–5.0)
Alkaline Phosphatase: 54 U/L (ref 38–126)
Anion gap: 8 (ref 5–15)
BUN: 26 mg/dL — ABNORMAL HIGH (ref 8–23)
CO2: 26 mmol/L (ref 22–32)
Calcium: 10.7 mg/dL — ABNORMAL HIGH (ref 8.9–10.3)
Chloride: 101 mmol/L (ref 98–111)
Creatinine: 1.67 mg/dL — ABNORMAL HIGH (ref 0.61–1.24)
GFR, Estimated: 42 mL/min — ABNORMAL LOW (ref >60)
Glucose, Bld: 158 mg/dL — ABNORMAL HIGH (ref 70–99)
Potassium: 4.1 mmol/L (ref 3.5–5.1)
Sodium: 136 mmol/L (ref 135–145)
Total Bilirubin: 0.5 mg/dL (ref 0.0–1.2)
Total Protein: 7.8 g/dL (ref 6.5–8.1)

## 2024-09-28 NOTE — Telephone Encounter (Signed)
 I spoke with Chad Gross to discuss lab results from today.  Calcium level is lower but remains elevated.  He will come in for a repeat chemistry panel 10/03/2024.  Message sent to the scheduling department.

## 2024-09-28 NOTE — Telephone Encounter (Signed)
 Called PT to confirm lab appt.

## 2024-10-03 ENCOUNTER — Other Ambulatory Visit: Payer: Self-pay

## 2024-10-03 ENCOUNTER — Inpatient Hospital Stay: Payer: MEDICARE | Attending: Oncology

## 2024-10-03 DIAGNOSIS — C9110 Chronic lymphocytic leukemia of B-cell type not having achieved remission: Secondary | ICD-10-CM

## 2024-10-03 DIAGNOSIS — D696 Thrombocytopenia, unspecified: Secondary | ICD-10-CM

## 2024-10-03 LAB — CMP (CANCER CENTER ONLY)
ALT: 12 U/L (ref 0–44)
AST: 23 U/L (ref 15–41)
Albumin: 4 g/dL (ref 3.5–5.0)
Alkaline Phosphatase: 55 U/L (ref 38–126)
Anion gap: 11 (ref 5–15)
BUN: 24 mg/dL — ABNORMAL HIGH (ref 8–23)
CO2: 25 mmol/L (ref 22–32)
Calcium: 10.6 mg/dL — ABNORMAL HIGH (ref 8.9–10.3)
Chloride: 100 mmol/L (ref 98–111)
Creatinine: 1.5 mg/dL — ABNORMAL HIGH (ref 0.61–1.24)
GFR, Estimated: 47 mL/min — ABNORMAL LOW (ref >60)
Glucose, Bld: 89 mg/dL (ref 70–99)
Potassium: 4.2 mmol/L (ref 3.5–5.1)
Sodium: 136 mmol/L (ref 135–145)
Total Bilirubin: 0.6 mg/dL (ref 0.0–1.2)
Total Protein: 8.2 g/dL — ABNORMAL HIGH (ref 6.5–8.1)

## 2024-10-06 ENCOUNTER — Encounter: Payer: Self-pay | Admitting: Oncology

## 2024-10-08 NOTE — Telephone Encounter (Signed)
 Note on MD desk for review

## 2024-10-22 ENCOUNTER — Encounter (HOSPITAL_BASED_OUTPATIENT_CLINIC_OR_DEPARTMENT_OTHER): Payer: Self-pay

## 2024-10-23 ENCOUNTER — Ambulatory Visit (HOSPITAL_BASED_OUTPATIENT_CLINIC_OR_DEPARTMENT_OTHER)
Admission: RE | Admit: 2024-10-23 | Discharge: 2024-10-23 | Disposition: A | Discharge status: Home / Self Care | Payer: MEDICARE | Source: Ambulatory Visit | Attending: Oncology | Admitting: Oncology

## 2024-10-23 DIAGNOSIS — C9110 Chronic lymphocytic leukemia of B-cell type not having achieved remission: Secondary | ICD-10-CM | POA: Insufficient documentation

## 2024-10-30 ENCOUNTER — Other Ambulatory Visit: Payer: Self-pay | Admitting: *Deleted

## 2024-10-30 ENCOUNTER — Telehealth: Payer: Self-pay | Admitting: Pharmacist

## 2024-10-30 ENCOUNTER — Inpatient Hospital Stay: Payer: MEDICARE

## 2024-10-30 ENCOUNTER — Encounter: Payer: Self-pay | Admitting: Oncology

## 2024-10-30 ENCOUNTER — Inpatient Hospital Stay: Payer: MEDICARE | Admitting: Oncology

## 2024-10-30 ENCOUNTER — Other Ambulatory Visit (HOSPITAL_BASED_OUTPATIENT_CLINIC_OR_DEPARTMENT_OTHER): Payer: Self-pay | Admitting: Family Medicine

## 2024-10-30 ENCOUNTER — Inpatient Hospital Stay: Payer: MEDICARE | Attending: Oncology

## 2024-10-30 VITALS — BP 126/67 | HR 89 | Temp 97.9°F | Resp 18 | Ht 71.0 in | Wt 181.5 lb

## 2024-10-30 DIAGNOSIS — C9110 Chronic lymphocytic leukemia of B-cell type not having achieved remission: Secondary | ICD-10-CM

## 2024-10-30 DIAGNOSIS — R5383 Other fatigue: Secondary | ICD-10-CM | POA: Insufficient documentation

## 2024-10-30 DIAGNOSIS — N529 Male erectile dysfunction, unspecified: Secondary | ICD-10-CM | POA: Insufficient documentation

## 2024-10-30 DIAGNOSIS — D696 Thrombocytopenia, unspecified: Secondary | ICD-10-CM | POA: Insufficient documentation

## 2024-10-30 DIAGNOSIS — K625 Hemorrhage of anus and rectum: Secondary | ICD-10-CM | POA: Insufficient documentation

## 2024-10-30 DIAGNOSIS — R630 Anorexia: Secondary | ICD-10-CM | POA: Diagnosis not present

## 2024-10-30 DIAGNOSIS — Z7901 Long term (current) use of anticoagulants: Secondary | ICD-10-CM

## 2024-10-30 DIAGNOSIS — R110 Nausea: Secondary | ICD-10-CM | POA: Insufficient documentation

## 2024-10-30 DIAGNOSIS — K5900 Constipation, unspecified: Secondary | ICD-10-CM | POA: Insufficient documentation

## 2024-10-30 LAB — CBC WITH DIFFERENTIAL (CANCER CENTER ONLY)
Abs Immature Granulocytes: 0.07 K/uL (ref 0.00–0.07)
Basophils Absolute: 0.1 K/uL (ref 0.0–0.1)
Basophils Relative: 0 %
Eosinophils Absolute: 0.3 K/uL (ref 0.0–0.5)
Eosinophils Relative: 1 %
HCT: 35.3 % — ABNORMAL LOW (ref 39.0–52.0)
Hemoglobin: 11.8 g/dL — ABNORMAL LOW (ref 13.0–17.0)
Immature Granulocytes: 0 %
Lymphocytes Relative: 85 %
Lymphs Abs: 19.2 K/uL — ABNORMAL HIGH (ref 0.7–4.0)
MCH: 31.8 pg (ref 26.0–34.0)
MCHC: 33.4 g/dL (ref 30.0–36.0)
MCV: 95.1 fL (ref 80.0–100.0)
Monocytes Absolute: 0.9 K/uL (ref 0.1–1.0)
Monocytes Relative: 4 %
Neutro Abs: 2.4 K/uL (ref 1.7–7.7)
Neutrophils Relative %: 10 %
Platelet Count: 80 K/uL — ABNORMAL LOW (ref 150–400)
RBC: 3.71 MIL/uL — ABNORMAL LOW (ref 4.22–5.81)
RDW: 14 % (ref 11.5–15.5)
WBC Count: 22.9 K/uL — ABNORMAL HIGH (ref 4.0–10.5)
nRBC: 0 % (ref 0.0–0.2)

## 2024-10-30 LAB — CMP (CANCER CENTER ONLY)
ALT: 11 U/L (ref 0–44)
AST: 22 U/L (ref 15–41)
Albumin: 4 g/dL (ref 3.5–5.0)
Alkaline Phosphatase: 61 U/L (ref 38–126)
Anion gap: 10 (ref 5–15)
BUN: 29 mg/dL — ABNORMAL HIGH (ref 8–23)
CO2: 28 mmol/L (ref 22–32)
Calcium: 12.1 mg/dL — ABNORMAL HIGH (ref 8.9–10.3)
Chloride: 99 mmol/L (ref 98–111)
Creatinine: 1.68 mg/dL — ABNORMAL HIGH (ref 0.61–1.24)
GFR, Estimated: 41 mL/min — ABNORMAL LOW (ref >60)
Glucose, Bld: 96 mg/dL (ref 70–99)
Potassium: 3.8 mmol/L (ref 3.5–5.1)
Sodium: 136 mmol/L (ref 135–145)
Total Bilirubin: 0.6 mg/dL (ref 0.0–1.2)
Total Protein: 9.1 g/dL — ABNORMAL HIGH (ref 6.5–8.1)

## 2024-10-30 LAB — LACTATE DEHYDROGENASE: LDH: 174 U/L (ref 105–235)

## 2024-10-30 LAB — PROTIME-INR
INR: 2.2 — ABNORMAL HIGH (ref 0.8–1.2)
Prothrombin Time: 25.5 s — ABNORMAL HIGH (ref 11.4–15.2)

## 2024-10-30 MED ORDER — ACALABRUTINIB 100 MG PO CAPS: 100.0000 mg | ORAL_CAPSULE | Freq: Two times a day (BID) | ORAL | 0 refills | Status: DC

## 2024-10-30 MED ORDER — ZOLEDRONIC ACID 4 MG/5ML IV CONC
3.0000 mg | Freq: Once | INTRAVENOUS | Status: AC
  Administered 2024-10-30: 3 mg via INTRAVENOUS
  Filled 2024-10-30: qty 3.75

## 2024-10-30 MED ORDER — SODIUM CHLORIDE 0.9 % IV SOLN: INTRAVENOUS | Status: AC

## 2024-10-30 NOTE — Telephone Encounter (Signed)
 Clinical Pharmacist Practitioner Encounter   Received new prescription for Calquence  (acalabrutinib ) for the treatment of progressive CLL, planned duration until disease progression or unacceptable toxicity.  Prescription dose and frequency assessed.   Current medication list in Epic reviewed, one DDIs with acalabrutinib  identified: Warfarin: Category C interaction (monitor therapy), Acalabrutinib  may increase the anticoagulant effects of anticoagulants such as warfarin. Monitor for signs of bleeding.   Acalabrutinib  does NOT increase the concentration of warfarin, therefore increased INR monitoring is not needed.  Acalabrutinib  does come with its own increased risk of bleeding which adds to the risk already associated with warfarin.  Evaluated chart and no patient barriers to medication adherence identified.   Prescription has been e-scribed to the [facility] for benefits analysis and approval.  Oral Oncology Clinic will continue to follow for insurance authorization, copayment issues, initial counseling and start date.   Sharley Keeler N. Aslee Such, PharmD, BCOP, CPP Hematology/Oncology Clinical Pharmacist [facility] Oral Chemotherapy Navigation Clinic [phone]  10/30/2024 5:14 PM

## 2024-10-30 NOTE — Progress Notes (Signed)
 [facility] OFFICE PROGRESS NOTE   Diagnosis: CLL  INTERVAL HISTORY:   Chad Gross returns as scheduled.  He has multiple complaints including malaise, anorexia, intermittent nausea, back pain, and confusion.  He reports confusion in the evenings.  He has experienced rectal bleeding for the past 3 weeks.  He has constipation and bleeding with bowel movements.  His significant other reports the blood is bright red.  Objective:  Vital signs in last 24 hours:  Blood pressure 126/67, pulse 89, temperature 97.9 F (36.6 C), temperature source Temporal, resp. rate 18, height 5' 11 (1.803 m), weight 181 lb 8 oz (82.3 kg), SpO2 98%.    Lymphatics: 1/2-1 cm bilateral cervical nodes, 1-2 cm bilateral axillary nodes, 1 cm left femoral node Resp: Lungs clear bilaterally Cardio: Regular rate and rhythm GI: Masslike fullness throughout the mid abdomen, no hepatosplenomegaly no leg edema Vascular: No leg edema Rectal: Normal tone, enlarged prostate without nodularity, brown stool, no anal or rectal mass   Lab Results:  Lab Results  Component Value Date   WBC 22.9 (H) 10/30/2024   HGB 11.8 (L) 10/30/2024   HCT 35.3 (L) 10/30/2024   MCV 95.1 10/30/2024   PLT 80 (L) 10/30/2024   NEUTROABS PENDING 10/30/2024    CMP  Lab Results  Component Value Date   NA 136 10/30/2024   K 3.8 10/30/2024   CL 99 10/30/2024   CO2 28 10/30/2024   GLUCOSE 96 10/30/2024   BUN 29 (H) 10/30/2024   CREATININE 1.68 (H) 10/30/2024   CALCIUM 12.1 (H) 10/30/2024   PROT 9.1 (H) 10/30/2024   ALBUMIN 4.0 10/30/2024   AST 22 10/30/2024   ALT 11 10/30/2024   ALKPHOS 61 10/30/2024   BILITOT 0.6 10/30/2024   GFRNONAA 41 (L) 10/30/2024   GFRAA 52 (L) 08/13/2020    No results found for: CEA1, CEA, CAN199, CA125  Lab Results  Component Value Date   INR 2.2 (H) 10/30/2024   LABPROT 25.5 (H) 10/30/2024    Imaging:  No results found.  Medications: I have reviewed the patient's  current medications.   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 He will return for a monthly PT check.  Indwelling IVC catheter.  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        4. 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 erectile dysfunction-followed by Dr. Watt , status post placement of a penile implant at [facility] in April 2014            7. CT of the abdomen/pelvis at [facility] urology February 2014 for evaluation of hematuria-12 mm external iliac nodes and left periaortic retroperitoneal lymph node-11 mm-potentially related to CLL      8.  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, peripheral lymphocytosis and small palpable lymph nodes          CT abdomen/pelvis 09/18/2019-extensive abdominal pelvic lymphadenopathy including a left periaortic node measuring 5.7 cm 08/13/2020 CLL FISH panel-no evidence of trisomy 12; no evidence of D13S319; no evidence of p53 deletion or amplification; deletion of ATM present 11/05/2020-peripheral blood TP53 mutation-negative Cycle 1 bendamustine /Rituxan  10/08/2020, 10/09/2020 Cycle 2 bendamustine /Rituxan  11/05/2020, 11/06/2020 Cycle 3 bendamustine /Rituxan  12/03/2020, 12/04/2020 Cycle 4 bendamustine /Rituxan  12/31/2020, 01/01/2021 CTs 01/19/2021-decrease in bulky abdomen/pelvic lymphadenopathy with more widespread ill-defined haziness in the mesentery and retroperitoneal fat, decreased pelvic lymphadenopathy Cycle 5  bendamustine /Rituxan  01/28/2021, 01/29/2021 Cycle 6 bendamustine /rituximab  02/25/2021, 02/26/2021 CT 02/22/2023-mild generalized lymphadenopathy in the neck, significant increase in chest andabdominal/pelvic lymph nodes, dominant nodal mass in the mesentery CT 06/25/2023-progressive lymphadenopathy in the abdomen and pelvis, dominant confluent mass in the upper  abdomen/retroperitoneum has enlarged, stable small nodes in the chest Cycle 1 Bendamustine /Rituxan  07/07/2023, 07/08/2023 Cycle 2 Bendamustine /Rituxan  08/08/2023, 08/09/2023 CT abdomen/pelvis 08/30/2023-bulky matted lymphadenopathy and soft tissue mass encasing retroperitoneal structures and extending into the small bowel mesentery slightly diminished in size.  Additional matted gastrohepatic ligament, portacaval, porta hepatis, bilateral iliac and inguinal lymph nodes also slightly diminished in size.  Spleen measures smaller. Cycle 3 Bendamustine /Rituxan  09/05/2023, 09/06/2023 Cycle 4 Bendamustine /Rituxan  10/03/2023, 10/04/2023 10/23/2024 CTs: New moderate right pleural effusion, progressive mediastinal and axillary nodes, confluent abdominal/retroperitoneal adenopathy with a mass at the level of the kidneys measuring 19.1 cm, marked bilateral pelvic sidewall adenopathy     9.  Basal cell carcinoma removed from the left superior central forehead 01/21/2020    10.  Renal insufficiency    11.  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  Indeterminate left hepatic lobe lesion on CT 02/22/2023 13.  Fever and altered mental status following cycle 2 Cemiplimab -no source for infection identified 14.  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  He has been maintained off of  systemic therapy since completing a course of Bendamustine /rituximab  in November 2024.  This was a second course of Bendamustine /rituximab .  There is now clinical and radiologic evidence of disease progression.  The lymphocyte count is higher and there is progressive palpable lymphadenopathy including a dominant large abdominal mass.  I reviewed the CT findings and images with Chad Gross.  He is symptomatic with malaise, nausea, anorexia, and he has developed hypercalcemia-likely symptomatic with mild confusion.  I recommend resuming systemic therapy.  We discussed treatment options.  The creatinine is elevated.  I recommend acalabrutinib .  We reviewed potential toxicities associated with acalabrutinib  including the chance of hematologic toxicity, diarrhea, rash, cardiac toxicity including atrial fibrillation, arthralgias, and bleeding.  He will receive additional teaching from the Cancer center pharmacist.  He will be treated with intravenous hydration and Zometa today.  We discussed potential toxicities associated with Zometa including the chance of osteonecrosis, flulike symptoms, and renal toxicity.  He agrees to proceed.  He will return for a chemistry panel on 11/01/2024.  He will be scheduled for an office and lab visit 11/07/2024.  A prescription for alcohol ibrutinib was entered today with the plan to initiate treatment by the end of this week.  Arley Hof, MD  10/30/2024  10:17 AM

## 2024-10-30 NOTE — Patient Instructions (Signed)

## 2024-10-30 NOTE — Progress Notes (Signed)
 Provided Chad Gross printed information on acalabrutinib  and how the oral oncology team works.

## 2024-10-31 ENCOUNTER — Encounter: Payer: Self-pay | Admitting: Oncology

## 2024-10-31 ENCOUNTER — Telehealth: Payer: Self-pay | Admitting: Pharmacy Technician

## 2024-10-31 ENCOUNTER — Other Ambulatory Visit: Payer: Self-pay | Admitting: *Deleted

## 2024-10-31 ENCOUNTER — Other Ambulatory Visit (HOSPITAL_COMMUNITY): Payer: Self-pay

## 2024-10-31 ENCOUNTER — Encounter: Payer: Self-pay | Admitting: *Deleted

## 2024-10-31 DIAGNOSIS — C9110 Chronic lymphocytic leukemia of B-cell type not having achieved remission: Secondary | ICD-10-CM

## 2024-10-31 MED ORDER — CALQUENCE 100 MG PO TABS
100.0000 mg | ORAL_TABLET | Freq: Two times a day (BID) | ORAL | 0 refills | Status: DC
  Filled 2024-11-05: qty 60, 30d supply, fill #0

## 2024-10-31 NOTE — Telephone Encounter (Signed)
 Oral Oncology Patient Advocate Encounter  Was successful in securing patient a $8,000 grant from [facility] to provide copayment coverage for Calquence .  This will keep the out of pocket expense at $0.     [employer] ID: [id number]   The billing information is as follows and has been shared with [facility].    RxBin: [id number] PCN: [id number] Member ID: [id number] Group ID: [id number] Dates of Eligibility: 10/01/2024 through 09/30/2025  Fund:  Chronic Lymphocytic Leukemia  Alfreida Steffenhagen ([name]) [name], CPhT  [facility], [name], [name] - Oral Chemotherapy Patient Advocate Specialist III Phone: [phone]  Fax: [phone]

## 2024-10-31 NOTE — Telephone Encounter (Signed)
 Oral Oncology Patient Advocate Encounter  After completing a benefits investigation, prior authorization for Calquence  is not required at this time through [facility] Part D.  Patient's copay is $1,972.93.     Chad Gross ([name]) [name], CPhT  [facility], [name], [name] - Oral Chemotherapy Patient Advocate Specialist III Phone: [phone]  Fax: [phone]

## 2024-10-31 NOTE — Progress Notes (Signed)
 Patient requested dietician referral and order placed

## 2024-11-01 ENCOUNTER — Encounter: Payer: Self-pay | Admitting: *Deleted

## 2024-11-01 ENCOUNTER — Other Ambulatory Visit: Payer: Self-pay | Admitting: *Deleted

## 2024-11-01 ENCOUNTER — Inpatient Hospital Stay: Payer: MEDICARE

## 2024-11-01 ENCOUNTER — Encounter (HOSPITAL_BASED_OUTPATIENT_CLINIC_OR_DEPARTMENT_OTHER): Payer: Self-pay | Admitting: Family Medicine

## 2024-11-01 ENCOUNTER — Ambulatory Visit (HOSPITAL_BASED_OUTPATIENT_CLINIC_OR_DEPARTMENT_OTHER): Payer: MEDICARE | Admitting: Family Medicine

## 2024-11-01 VITALS — BP 136/75 | HR 94 | Temp 97.9°F | Resp 18 | Ht 71.0 in | Wt 182.0 lb

## 2024-11-01 DIAGNOSIS — J90 Pleural effusion, not elsewhere classified: Secondary | ICD-10-CM | POA: Diagnosis not present

## 2024-11-01 DIAGNOSIS — C9110 Chronic lymphocytic leukemia of B-cell type not having achieved remission: Secondary | ICD-10-CM

## 2024-11-01 LAB — CMP (CANCER CENTER ONLY)
ALT: 12 U/L (ref 0–44)
AST: 22 U/L (ref 15–41)
Albumin: 3.8 g/dL (ref 3.5–5.0)
Alkaline Phosphatase: 55 U/L (ref 38–126)
Anion gap: 10 (ref 5–15)
BUN: 30 mg/dL — ABNORMAL HIGH (ref 8–23)
CO2: 24 mmol/L (ref 22–32)
Calcium: 11.1 mg/dL — ABNORMAL HIGH (ref 8.9–10.3)
Chloride: 101 mmol/L (ref 98–111)
Creatinine: 1.64 mg/dL — ABNORMAL HIGH (ref 0.61–1.24)
GFR, Estimated: 42 mL/min — ABNORMAL LOW (ref >60)
Glucose, Bld: 82 mg/dL (ref 70–99)
Potassium: 4.2 mmol/L (ref 3.5–5.1)
Sodium: 135 mmol/L (ref 135–145)
Total Bilirubin: 0.6 mg/dL (ref 0.0–1.2)
Total Protein: 8.4 g/dL — ABNORMAL HIGH (ref 6.5–8.1)

## 2024-11-01 NOTE — Patient Instructions (Signed)
  Medication Instructions:  Your physician recommends that you continue on your current medications as directed. Please refer to the Current Medication list given to you today. --If you need a refill on any your medications before your next appointment, please call your pharmacy first. If no refills are authorized on file call the office.-- Lab Work: Your physician has recommended that you have lab work today: no If you have labs (blood work) drawn today and your tests are completely normal, you will receive your results via MyChart message OR a phone call from our staff.  Please ensure you check your voicemail in the event that you authorized detailed messages to be left on a delegated number. If you have any lab test that is abnormal or we need to change your treatment, we will call you to review the results.  Referrals/Procedures/Imaging: yes  Follow-Up: Your next appointment:   Your physician recommends that you schedule a follow-up appointment in: keep upcoming appt with Dr. de Cuba  You will receive a text message or e-mail with a link to a survey about your care and experience with us  today! We would greatly appreciate your feedback!   Thanks for letting us  be apart of your health journey!!  Primary Care and Sports Medicine   Dr. Quintin sheerer Cuba   We encourage you to activate your patient portal called MyChart.  Sign up information is provided on this After Visit Summary.  MyChart is used to connect with patients for Virtual Visits (Telemedicine).  Patients are able to view lab/test results, encounter notes, upcoming appointments, etc.  Non-urgent messages can be sent to your provider as well. To learn more about what you can do with MyChart, please visit --  [email or web address].

## 2024-11-01 NOTE — Assessment & Plan Note (Signed)
 Most recent labs show some improvement compared to labs earlier in the week.  Currently proceeding with treatment through oncology and does have a follow-up visit scheduled.  He will be going downstairs to review labs today to determine next steps in management. We did discuss potential symptoms related to hypercalcemia.  Discussed that this can cause more frequent urination which could also be affecting issues with urinary incontinence.  Discussed that it can also lead to confusion and other changes.  Would expect to see gradual improvement with this with resolving elevated calcium

## 2024-11-01 NOTE — Progress Notes (Signed)
 Procedures performed today:    None.  Independent interpretation of notes and tests performed by another provider:   None.  Brief History, Exam, Impression, and Recommendations:    BP 136/75 (BP Location: Left Arm, Patient Position: Sitting, Cuff Size: Normal)   Pulse 94   Temp 97.9 F (36.6 C) (Oral)   Resp 18   Ht 5' 11 (1.803 m)   Wt 182 lb (82.6 kg)   SpO2 96%   BMI 25.38 kg/m   Discussed the use of AI scribe software for clinical note transcription with the patient, who gave verbal consent to proceed.  History of Present Illness Chad Gross is a 78 year old male with chronic lymphocytic leukemia who presents with worsening weakness, confusion, and throat symptoms.  He has experienced increased weakness, difficulty with motivation, and episodes of disorientation and confusion, which have worsened since his last visit. His chronic lymphocytic leukemia symptoms have become more pronounced recently.  Two days ago, he received a fluid infusion lasting about two and a half hours. Despite this, he continues to experience shortness of breath, particularly with minimal exertion such as walking the dog or around the house, sometimes needing to sit down to stabilize himself. Despite drinking ample water, he reports a dry throat and voice.  He has a persistent cough, especially in the mornings, accompanied by phlegm production. The phlegm is discolored, and over-the-counter remedies like [employer] have not provided significant relief. A CT scan with contrast on December 25th was performed, and there were differences compared to a prior scan.  He reports new urinary symptoms, including episodes of incontinence, particularly at night or after sitting for extended periods. He tries to preemptively go to the bathroom to manage this. He has a history of similar issues, which resolved previously but have recurred.  He mentions constipation and occasional stomach  noises. No significant change in symptoms when lying down versus sitting or standing. No new urinary symptoms beyond the incontinence issues.  On exam, patient is in no acute distress, vital signs stable.  Cardiovascular exam with regular rate and rhythm, lungs clear to auscultation bilaterally, some decreased breath sounds in right lung base.  Patient mildly sluggish compared to prior office visits, likely related to observed hypercalcemia.  Pleural effusion Assessment & Plan: He did have recent imaging completed given clinical changes and lab abnormalities.  On CT scan, new moderate right pleural effusion was observed.  As discussed above, he has been having issues with ongoing cough.  This will be productive at times with dark-colored phlegm. Slightly diminished breath sounds in right lung base today.  Given history and findings on imaging, I do feel that evaluation with pulmonology would be beneficial.  Referral placed today to assist with scheduling this.  Orders: -     Pulmonary Visit  Hypercalcemia Assessment & Plan: Most recent labs show some improvement compared to labs earlier in the week.  Currently proceeding with treatment through oncology and does have a follow-up visit scheduled.  He will be going downstairs to review labs today to determine next steps in management. We did discuss potential symptoms related to hypercalcemia.  Discussed that this can cause more frequent urination which could also be affecting issues with urinary incontinence.  Discussed that it can also lead to confusion and other changes.  Would expect to see gradual improvement with this with resolving elevated calcium   Return for next scheduled appt in January.  Spent 32 minutes on this patient  encounter, including preparation, chart review, face-to-face counseling with patient and coordination of care, and documentation of encounter   ___________________________________________ Chad Nakama de [place], MD, ABFM,  CAQSM Primary Care and Sports Medicine [facility]

## 2024-11-01 NOTE — Progress Notes (Signed)
 Reviewed CMP results w/Mr. Hagwood. Per Dr. Cloretta Ca+ is better. Continue to push po fluids and f/u on 12/10 for lab/OV.

## 2024-11-01 NOTE — Assessment & Plan Note (Signed)
 He did have recent imaging completed given clinical changes and lab abnormalities.  On CT scan, new moderate right pleural effusion was observed.  As discussed above, he has been having issues with ongoing cough.  This will be productive at times with dark-colored phlegm. Slightly diminished breath sounds in right lung base today.  Given history and findings on imaging, I do feel that evaluation with pulmonology would be beneficial.  Referral placed today to assist with scheduling this.

## 2024-11-02 NOTE — Telephone Encounter (Signed)
 Oral Oncology Patient Advocate Encounter  Pharmacy card is not active. Pending diagnoses verification form to be accepted.  Piper Hassebrock ([name]) [name], CPhT  [facility], [name], [name] - Oral Chemotherapy Patient Advocate Specialist III Phone: [phone]  Fax: [phone]

## 2024-11-05 ENCOUNTER — Other Ambulatory Visit: Payer: Self-pay

## 2024-11-05 ENCOUNTER — Other Ambulatory Visit (HOSPITAL_COMMUNITY): Payer: Self-pay

## 2024-11-05 ENCOUNTER — Encounter: Payer: Self-pay | Admitting: Oncology

## 2024-11-05 ENCOUNTER — Telehealth: Payer: Self-pay | Admitting: Pharmacy Technician

## 2024-11-05 ENCOUNTER — Telehealth: Payer: Self-pay | Admitting: Pharmacist

## 2024-11-05 ENCOUNTER — Other Ambulatory Visit: Payer: Self-pay | Admitting: Pharmacy Technician

## 2024-11-05 DIAGNOSIS — C9110 Chronic lymphocytic leukemia of B-cell type not having achieved remission: Secondary | ICD-10-CM

## 2024-11-05 NOTE — Progress Notes (Signed)
 Specialty Pharmacy Initial Fill Coordination Note  Chad Gross is a 78 y.o. male contacted today regarding initial fill of specialty medication(s) Acalabrutinib  Maleate (Calquence )   Patient requested Delivery   Delivery date: 11/07/24   Verified address: [address] [place] [address]   Medication will be filled on: 11/06/24   Patient is aware of $0 copayment. Leisure centre manager.   Wyat Infinger ([name]) [name], CPhT  [facility], [name], [name] - Oral Chemotherapy Patient Advocate Specialist III Phone: [phone]  Fax: [phone]

## 2024-11-05 NOTE — Progress Notes (Signed)
 Patient education documented in EPIC note on 11/05/24.

## 2024-11-05 NOTE — Telephone Encounter (Signed)
 Oral Oncology Patient Advocate Encounter  Dx verification form has been approved by [facility].  Pharmacy card now active. No further action needed.  Chad Gross ([name]) [name], CPhT  [name], [name], [name] - Oral Chemotherapy Patient Advocate Specialist III Phone: [phone]  Fax: [phone]

## 2024-11-05 NOTE — Telephone Encounter (Signed)
 Oral Oncology Patient Advocate Encounter  Patient successfully OnBoarded and drug education provided by pharmacist. Medication scheduled to be shipped on 12/09 for delivery on 12/10 from [facility] to patient's address. Patient also knows to call me at [phone] with any questions or concerns regarding receiving medication or if there is any unexpected change in co-pay.    Temperence Zenor ([name]) [name], CPhT  [facility], [name], [name] - Oral Chemotherapy Patient Advocate Specialist III Phone: [phone]  Fax: [phone]

## 2024-11-05 NOTE — Patient Instructions (Signed)
 [facility] - A Dept Of [place]. [facility]   Thank you for choosing [facility] to provide your oncology/hematology care and for allowing us  to participate in your care today!  As a reminder, we spoke about the following today: Calquence  (acalabrutinib ) for the treatment of progressive CLL, planned duration until disease progression or unacceptable toxicity.   Treatment goal: Control  Medication handout has been provided.   **For oral cancer medication prescription refill requests, contact your pharmacy and they will contact our office if needed. Allow 5-7 days for refills to be completed by your specialty pharmacy.    Cancer Center General Instructions:  If you have an appointment at the [facility], please go directly to the Cancer Center and check in at the registration area.  We strive to give you quality time with your provider. You may need to reschedule your appointment if you arrive late (15 or more minutes).  Arriving late affects you and other patients whose appointments are after yours.  Also, if you miss three or more appointments without notifying the office, you may be dismissed from the clinic at the provider's discretion.      BELOW ARE SYMPTOMS THAT SHOULD BE REPORTED IMMEDIATELY: *FEVER GREATER THAN 100.4 F (38 C) OR HIGHER *CHILLS OR SWEATING *NAUSEA AND VOMITING THAT IS NOT CONTROLLED WITH YOUR NAUSEA MEDICATION *UNUSUAL SHORTNESS OF BREATH *UNUSUAL BRUISING OR BLEEDING *URINARY PROBLEMS (pain or burning when urinating, or frequent urination) *BOWEL PROBLEMS (unusual diarrhea, constipation, pain near the anus) TENDERNESS IN MOUTH AND THROAT WITH OR WITHOUT PRESENCE OF ULCERS (sore throat, sores in mouth, or a toothache) UNUSUAL RASH, SWELLING OR PAIN  UNUSUAL VAGINAL DISCHARGE OR ITCHING   Items with * indicate a potential emergency and should be followed up as soon as possible or go to the Emergency Department if any  problems should occur.  Should you have questions after your visit or need to cancel or reschedule your appointment, please contact [facility] - A Dept Of [place]. [facility]  [phone] and follow the prompts.  Office hours are 8:00 a.m. to 4:30 p.m. Monday - Friday. Please note that voicemails left after 4:00 p.m. may not be returned until the following business day.  We are closed weekends and major holidays. You have access to a nurse at all times for urgent questions. Please call the main number to the clinic [phone] and follow the prompts.  For any non-urgent questions, you may also contact your provider using MyChart. We now offer e-Visits for anyone 46 and older to request care online for non-urgent symptoms. For details visit [email or web address].   Also download the MyChart app! Go to the app store, search MyChart, open the app, select [place], and log in with your MyChart username and password.

## 2024-11-05 NOTE — Progress Notes (Signed)
 Clinical Pharmacist Practitioner Encounter   [facility] (Specialty) will deliver medication to patient on 11/07/24.  Patient knows to start once they have medication in hand.  Patient Education I spoke with patient for overview of new oral chemotherapy medication: Calquence  (acalabrutinib ) for the treatment of progressive CLL, planned duration until disease progression or unacceptable toxicity.   Treatment goal: Control  Counseled patient on administration, dosing, side effects, monitoring, drug-food interactions, safe handling, storage, and disposal. Patient will take 1 tablet (100 mg total) by mouth 2 (two) times daily.   Side effects include but not limited to: headache, diarrhea, decreased wbc/plt/hgb, fatigue.   Diarrhea: patient knows to use loperamide as needed and call the office if they are having four or more loose stool per day  Reviewed with patient importance of keeping a medication schedule and plan for any missed doses.  After discussion with patient no patient barriers to medication adherence identified.   Distress evaluation: Distress thermometer not completed during telephone call as patient has been on previous lines of therapy.   Communication and Learning Assessment Primary learner: patient Barriers to learning: No barriers Preferred language: English Learning preferences: Listening Reading   [name] voiced understanding and appreciation. All questions answered. Medication handout provided.  Provided patient with Oral Chemotherapy Navigation Clinic phone number. Patient knows to call the office with questions or concerns. Reviewed with patient the expectations for rescheduling or cancelling appointments.  Oral Chemotherapy Navigation Clinic will continue to follow.  Chad Gross N. Zaccheaus Storlie, PharmD, BCOP, CPP Hematology/Oncology Clinical Pharmacist [facility]/AP Oral Chemotherapy Navigation Clinic [phone]  11/05/2024 10:42 AM

## 2024-11-06 ENCOUNTER — Other Ambulatory Visit (HOSPITAL_BASED_OUTPATIENT_CLINIC_OR_DEPARTMENT_OTHER): Payer: Self-pay | Admitting: Family Medicine

## 2024-11-06 DIAGNOSIS — I10 Essential (primary) hypertension: Secondary | ICD-10-CM

## 2024-11-06 DIAGNOSIS — N183 Chronic kidney disease, stage 3 unspecified: Secondary | ICD-10-CM

## 2024-11-07 ENCOUNTER — Inpatient Hospital Stay: Payer: MEDICARE

## 2024-11-07 ENCOUNTER — Inpatient Hospital Stay: Payer: MEDICARE | Admitting: Nurse Practitioner

## 2024-11-07 ENCOUNTER — Encounter: Payer: Self-pay | Admitting: Nurse Practitioner

## 2024-11-07 VITALS — BP 125/74 | HR 84 | Temp 97.7°F | Resp 16 | Wt 184.1 lb

## 2024-11-07 DIAGNOSIS — C9110 Chronic lymphocytic leukemia of B-cell type not having achieved remission: Secondary | ICD-10-CM | POA: Diagnosis not present

## 2024-11-07 LAB — CBC WITH DIFFERENTIAL (CANCER CENTER ONLY)
Abs Immature Granulocytes: 0.08 K/uL — ABNORMAL HIGH (ref 0.00–0.07)
Basophils Absolute: 0.1 K/uL (ref 0.0–0.1)
Basophils Relative: 0 %
Eosinophils Absolute: 0.2 K/uL (ref 0.0–0.5)
Eosinophils Relative: 1 %
HCT: 34.5 % — ABNORMAL LOW (ref 39.0–52.0)
Hemoglobin: 11.6 g/dL — ABNORMAL LOW (ref 13.0–17.0)
Immature Granulocytes: 0 %
Lymphocytes Relative: 85 %
Lymphs Abs: 21.8 K/uL — ABNORMAL HIGH (ref 0.7–4.0)
MCH: 31.6 pg (ref 26.0–34.0)
MCHC: 33.6 g/dL (ref 30.0–36.0)
MCV: 94 fL (ref 80.0–100.0)
Monocytes Absolute: 1.2 K/uL — ABNORMAL HIGH (ref 0.1–1.0)
Monocytes Relative: 5 %
Neutro Abs: 2.4 K/uL (ref 1.7–7.7)
Neutrophils Relative %: 9 %
Platelet Count: 81 K/uL — ABNORMAL LOW (ref 150–400)
RBC: 3.67 MIL/uL — ABNORMAL LOW (ref 4.22–5.81)
RDW: 14.4 % (ref 11.5–15.5)
Smear Review: NORMAL
WBC Count: 25.7 K/uL — ABNORMAL HIGH (ref 4.0–10.5)
nRBC: 0 % (ref 0.0–0.2)

## 2024-11-07 LAB — CMP (CANCER CENTER ONLY)
ALT: 12 U/L (ref 0–44)
AST: 25 U/L (ref 15–41)
Albumin: 3.8 g/dL (ref 3.5–5.0)
Alkaline Phosphatase: 59 U/L (ref 38–126)
Anion gap: 11 (ref 5–15)
BUN: 27 mg/dL — ABNORMAL HIGH (ref 8–23)
CO2: 24 mmol/L (ref 22–32)
Calcium: 10.6 mg/dL — ABNORMAL HIGH (ref 8.9–10.3)
Chloride: 101 mmol/L (ref 98–111)
Creatinine: 1.79 mg/dL — ABNORMAL HIGH (ref 0.61–1.24)
GFR, Estimated: 38 mL/min — ABNORMAL LOW (ref >60)
Glucose, Bld: 88 mg/dL (ref 70–99)
Potassium: 4.1 mmol/L (ref 3.5–5.1)
Sodium: 136 mmol/L (ref 135–145)
Total Bilirubin: 0.6 mg/dL (ref 0.0–1.2)
Total Protein: 8.9 g/dL — ABNORMAL HIGH (ref 6.5–8.1)

## 2024-11-07 LAB — PROTIME-INR
INR: 2.4 — ABNORMAL HIGH (ref 0.8–1.2)
Prothrombin Time: 27.6 s — ABNORMAL HIGH (ref 11.4–15.2)

## 2024-11-07 NOTE — Progress Notes (Signed)
 [facility] OFFICE PROGRESS NOTE   Diagnosis: CLL  INTERVAL HISTORY:   Chad Gross returns as scheduled.  He continues to be fatigued.  Appetite in general is poor.  He states he is not interested in eating.  He drinks 1 nutritional supplement a day and does the best he can with eating.  He feels fluid intake is good.  No diarrhea.  He does note constipation.  No nausea or vomiting.  No fevers or sweats.  He feels he is confused.  Objective:  Vital signs in last 24 hours:  Blood pressure 125/74, pulse 84, temperature 97.7 F (36.5 C), temperature source Temporal, resp. rate 16, weight 184 lb 1.6 oz (83.5 kg), SpO2 98%.    HEENT: No thrush.  Mucous membranes appear moist. Lymphatics: Bilateral cervical, axillary and inguinal/femoral nodes. Resp: Breath sounds diminished at the right lower lung field.  No respiratory distress. Cardio: Regular rate and rhythm. GI: Masslike fullness throughout the mid abdomen, associated tenderness. Vascular: No leg edema. Neuro: Alert and oriented.  Follows commands.   Lab Results:  Lab Results  Component Value Date   WBC 22.9 (H) 10/30/2024   HGB 11.8 (L) 10/30/2024   HCT 35.3 (L) 10/30/2024   MCV 95.1 10/30/2024   PLT 80 (L) 10/30/2024   NEUTROABS 2.4 10/30/2024    Imaging:  No results found.  Medications: I have reviewed the patient's current medications.  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 He will return for a monthly PT check.  Indwelling IVC catheter.  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        4. 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 erectile dysfunction-followed by Dr. Watt , status post placement of a penile implant at [facility] in April 2014            7. CT of the abdomen/pelvis at [facility] urology February 2014 for evaluation of hematuria-12 mm external iliac  nodes and left periaortic retroperitoneal lymph node-11 mm-potentially related to CLL      8.  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, peripheral lymphocytosis and small palpable lymph nodes          CT abdomen/pelvis 09/18/2019-extensive abdominal pelvic lymphadenopathy including a left periaortic node measuring 5.7 cm 08/13/2020 CLL FISH panel-no evidence of trisomy 12; no evidence of [id number]; no evidence of p53 deletion or amplification; deletion of ATM present 11/05/2020-peripheral blood TP53 mutation-negative Cycle 1 bendamustine /Rituxan  10/08/2020, 10/09/2020 Cycle 2 bendamustine /Rituxan  11/05/2020, 11/06/2020 Cycle 3 bendamustine /Rituxan  12/03/2020, 12/04/2020 Cycle 4 bendamustine /Rituxan  12/31/2020, 01/01/2021 CTs 01/19/2021-decrease in bulky abdomen/pelvic lymphadenopathy with more widespread ill-defined haziness in the mesentery and retroperitoneal fat, decreased pelvic lymphadenopathy Cycle 5 bendamustine /Rituxan  01/28/2021, 01/29/2021 Cycle 6 bendamustine /rituximab  02/25/2021, 02/26/2021 CT 02/22/2023-mild generalized lymphadenopathy in the neck, significant increase in chest andabdominal/pelvic lymph nodes, dominant nodal mass in the mesentery CT 06/25/2023-progressive lymphadenopathy in the abdomen and pelvis, dominant confluent mass in the upper abdomen/retroperitoneum has enlarged, stable small nodes in the chest Cycle 1 Bendamustine /Rituxan  07/07/2023, 07/08/2023 Cycle 2 Bendamustine /Rituxan  08/08/2023, 08/09/2023 CT abdomen/pelvis 08/30/2023-bulky matted lymphadenopathy and soft tissue mass encasing retroperitoneal structures and extending into the small bowel mesentery slightly diminished in size.  Additional matted gastrohepatic ligament, portacaval, porta hepatis, bilateral iliac and inguinal lymph nodes also slightly diminished in size.  Spleen measures smaller. Cycle 3 Bendamustine /Rituxan  09/05/2023, 09/06/2023 Cycle 4 Bendamustine /Rituxan   10/03/2023,  10/04/2023 10/23/2024 CTs: New moderate right pleural effusion, progressive mediastinal and axillary nodes, confluent abdominal/retroperitoneal adenopathy with a mass at the level of the kidneys measuring 19.1 cm, marked bilateral pelvic sidewall adenopathy Trial of acalabrutinib  11/07/2024     9.  Basal cell carcinoma removed from the left superior central forehead 01/21/2020    10.  Renal insufficiency    11.  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  Indeterminate left hepatic lobe lesion on CT 02/22/2023 13.  Fever and altered mental status following cycle 2 Cemiplimab -no source for infection identified 14.  Hypercalcemia-Zometa  10/30/2024    Disposition: Chad Gross has CLL, maintained off of systemic therapy since he completed a course of Bendamustine /rituximab  November 2024.  There has been recent clinical and radiologic evidence of disease progression.  The plan is to begin acalabrutinib .  We again reviewed potential toxicities.  He agrees to proceed.  He received the medication today and will begin tonight.  He received Zometa  last week for hypercalcemia.  Calcium level today is better.  He will return for lab and follow-up on 11/20/2024.  We are available to see him sooner if needed.    Chad Gross ANP/GNP-BC   11/07/2024  2:05 PM  This was a shared visit with Chad Gross.  Mr.  Gross appears unchanged.  Hypercalcemia has improved following Zometa .  He continues to have malaise and intermittent confusion.  He will begin treatment without acalabrutinib  today.  We are hopeful his symptoms will improve with treatment of the CLL.  Arvella Hof, MD

## 2024-11-11 ENCOUNTER — Emergency Department (HOSPITAL_COMMUNITY): Payer: MEDICARE

## 2024-11-11 ENCOUNTER — Encounter: Payer: Self-pay | Admitting: Oncology

## 2024-11-11 ENCOUNTER — Other Ambulatory Visit: Payer: Self-pay

## 2024-11-11 ENCOUNTER — Inpatient Hospital Stay (HOSPITAL_COMMUNITY)
Admission: EM | Admit: 2024-11-11 | Discharge: 2024-11-15 | DRG: 871 | Disposition: A | Payer: MEDICARE | Attending: Internal Medicine | Admitting: Internal Medicine

## 2024-11-11 ENCOUNTER — Encounter (HOSPITAL_COMMUNITY): Payer: Self-pay

## 2024-11-11 DIAGNOSIS — Z8249 Family history of ischemic heart disease and other diseases of the circulatory system: Secondary | ICD-10-CM

## 2024-11-11 DIAGNOSIS — E871 Hypo-osmolality and hyponatremia: Secondary | ICD-10-CM | POA: Diagnosis present

## 2024-11-11 DIAGNOSIS — L89153 Pressure ulcer of sacral region, stage 3: Secondary | ICD-10-CM | POA: Diagnosis present

## 2024-11-11 DIAGNOSIS — E860 Dehydration: Secondary | ICD-10-CM | POA: Diagnosis present

## 2024-11-11 DIAGNOSIS — E785 Hyperlipidemia, unspecified: Secondary | ICD-10-CM | POA: Diagnosis present

## 2024-11-11 DIAGNOSIS — I959 Hypotension, unspecified: Secondary | ICD-10-CM | POA: Diagnosis present

## 2024-11-11 DIAGNOSIS — I129 Hypertensive chronic kidney disease with stage 1 through stage 4 chronic kidney disease, or unspecified chronic kidney disease: Secondary | ICD-10-CM | POA: Diagnosis present

## 2024-11-11 DIAGNOSIS — Z823 Family history of stroke: Secondary | ICD-10-CM

## 2024-11-11 DIAGNOSIS — J188 Other pneumonia, unspecified organism: Secondary | ICD-10-CM

## 2024-11-11 DIAGNOSIS — Z86711 Personal history of pulmonary embolism: Secondary | ICD-10-CM

## 2024-11-11 DIAGNOSIS — D630 Anemia in neoplastic disease: Secondary | ICD-10-CM | POA: Diagnosis present

## 2024-11-11 DIAGNOSIS — Z806 Family history of leukemia: Secondary | ICD-10-CM

## 2024-11-11 DIAGNOSIS — Z8261 Family history of arthritis: Secondary | ICD-10-CM

## 2024-11-11 DIAGNOSIS — N179 Acute kidney failure, unspecified: Secondary | ICD-10-CM | POA: Diagnosis present

## 2024-11-11 DIAGNOSIS — C9110 Chronic lymphocytic leukemia of B-cell type not having achieved remission: Secondary | ICD-10-CM | POA: Diagnosis present

## 2024-11-11 DIAGNOSIS — A4151 Sepsis due to Escherichia coli [E. coli]: Principal | ICD-10-CM | POA: Diagnosis present

## 2024-11-11 DIAGNOSIS — J189 Pneumonia, unspecified organism: Secondary | ICD-10-CM | POA: Diagnosis present

## 2024-11-11 DIAGNOSIS — Z7901 Long term (current) use of anticoagulants: Secondary | ICD-10-CM

## 2024-11-11 DIAGNOSIS — Z66 Do not resuscitate: Secondary | ICD-10-CM | POA: Diagnosis present

## 2024-11-11 DIAGNOSIS — Z803 Family history of malignant neoplasm of breast: Secondary | ICD-10-CM

## 2024-11-11 DIAGNOSIS — Z1152 Encounter for screening for COVID-19: Secondary | ICD-10-CM

## 2024-11-11 DIAGNOSIS — Z832 Family history of diseases of the blood and blood-forming organs and certain disorders involving the immune mechanism: Secondary | ICD-10-CM

## 2024-11-11 DIAGNOSIS — D6959 Other secondary thrombocytopenia: Secondary | ICD-10-CM | POA: Diagnosis present

## 2024-11-11 DIAGNOSIS — R6520 Severe sepsis without septic shock: Secondary | ICD-10-CM | POA: Diagnosis present

## 2024-11-11 DIAGNOSIS — N1831 Chronic kidney disease, stage 3a: Secondary | ICD-10-CM | POA: Diagnosis present

## 2024-11-11 LAB — CBC WITH DIFFERENTIAL/PLATELET
Abs Immature Granulocytes: 0.06 K/uL (ref 0.00–0.07)
Basophils Absolute: 0 K/uL (ref 0.0–0.1)
Basophils Relative: 0 %
Eosinophils Absolute: 0 K/uL (ref 0.0–0.5)
Eosinophils Relative: 0 %
HCT: 28.6 % — ABNORMAL LOW (ref 39.0–52.0)
Hemoglobin: 9.7 g/dL — ABNORMAL LOW (ref 13.0–17.0)
Immature Granulocytes: 0 %
Lymphocytes Relative: 79 %
Lymphs Abs: 21.6 K/uL — ABNORMAL HIGH (ref 0.7–4.0)
MCH: 32.3 pg (ref 26.0–34.0)
MCHC: 33.9 g/dL (ref 30.0–36.0)
MCV: 95.3 fL (ref 80.0–100.0)
Monocytes Absolute: 1 K/uL (ref 0.1–1.0)
Monocytes Relative: 4 %
Neutro Abs: 4.5 K/uL (ref 1.7–7.7)
Neutrophils Relative %: 17 %
Platelets: 62 K/uL — ABNORMAL LOW (ref 150–400)
RBC: 3 MIL/uL — ABNORMAL LOW (ref 4.22–5.81)
RDW: 14.6 % (ref 11.5–15.5)
WBC: 27.2 K/uL — ABNORMAL HIGH (ref 4.0–10.5)
nRBC: 0.4 % — ABNORMAL HIGH (ref 0.0–0.2)

## 2024-11-11 LAB — URINALYSIS, W/ REFLEX TO CULTURE (INFECTION SUSPECTED)
Bacteria, UA: NONE SEEN
Bilirubin Urine: NEGATIVE
Glucose, UA: NEGATIVE mg/dL
Hgb urine dipstick: NEGATIVE
Ketones, ur: NEGATIVE mg/dL
Leukocytes,Ua: NEGATIVE
Nitrite: NEGATIVE
Protein, ur: 30 mg/dL — AB
Specific Gravity, Urine: 1.014 (ref 1.005–1.030)
pH: 5 (ref 5.0–8.0)

## 2024-11-11 LAB — COMPREHENSIVE METABOLIC PANEL WITH GFR
ALT: 12 U/L (ref 0–44)
AST: 26 U/L (ref 15–41)
Albumin: 3.3 g/dL — ABNORMAL LOW (ref 3.5–5.0)
Alkaline Phosphatase: 40 U/L (ref 38–126)
Anion gap: 9 (ref 5–15)
BUN: 46 mg/dL — ABNORMAL HIGH (ref 8–23)
CO2: 22 mmol/L (ref 22–32)
Calcium: 9.6 mg/dL (ref 8.9–10.3)
Chloride: 104 mmol/L (ref 98–111)
Creatinine, Ser: 1.93 mg/dL — ABNORMAL HIGH (ref 0.61–1.24)
GFR, Estimated: 35 mL/min — ABNORMAL LOW (ref >60)
Glucose, Bld: 110 mg/dL — ABNORMAL HIGH (ref 70–99)
Potassium: 4.6 mmol/L (ref 3.5–5.1)
Sodium: 134 mmol/L — ABNORMAL LOW (ref 135–145)
Total Bilirubin: 0.6 mg/dL (ref 0.0–1.2)
Total Protein: 7.3 g/dL (ref 6.5–8.1)

## 2024-11-11 LAB — I-STAT CG4 LACTIC ACID, ED
Lactic Acid, Venous: 1.1 mmol/L (ref 0.5–1.9)
Lactic Acid, Venous: 1 mmol/L (ref 0.5–1.9)

## 2024-11-11 LAB — PROTIME-INR
INR: 2.1 — ABNORMAL HIGH (ref 0.8–1.2)
Prothrombin Time: 24.2 s — ABNORMAL HIGH (ref 11.4–15.2)

## 2024-11-11 MED ORDER — ACETAMINOPHEN 500 MG PO TABS
1000.0000 mg | ORAL_TABLET | Freq: Once | ORAL | Status: AC
  Administered 2024-11-11: 1000 mg via ORAL
  Filled 2024-11-11: qty 2

## 2024-11-11 MED ORDER — LACTATED RINGERS IV BOLUS
1000.0000 mL | Freq: Once | INTRAVENOUS | Status: AC
  Administered 2024-11-11: 1000 mL via INTRAVENOUS

## 2024-11-11 MED ORDER — VANCOMYCIN HCL IN DEXTROSE 1-5 GM/200ML-% IV SOLN
1000.0000 mg | Freq: Once | INTRAVENOUS | Status: DC
  Filled 2024-11-11: qty 200

## 2024-11-11 MED ORDER — SODIUM CHLORIDE 0.9 % IV SOLN
2.0000 g | Freq: Once | INTRAVENOUS | Status: AC
  Administered 2024-11-11: 2 g via INTRAVENOUS
  Filled 2024-11-11: qty 12.5

## 2024-11-11 MED ORDER — LACTATED RINGERS IV BOLUS (SEPSIS)
1000.0000 mL | Freq: Once | INTRAVENOUS | Status: AC
  Administered 2024-11-11: 1000 mL via INTRAVENOUS

## 2024-11-11 MED ORDER — PRAVASTATIN SODIUM 40 MG PO TABS
40.0000 mg | ORAL_TABLET | Freq: Every day | ORAL | Status: DC
  Administered 2024-11-12 – 2024-11-14 (×3): 40 mg via ORAL
  Filled 2024-11-11 (×3): qty 1

## 2024-11-11 MED ORDER — VANCOMYCIN HCL 1500 MG/300ML IV SOLN
1500.0000 mg | Freq: Once | INTRAVENOUS | Status: AC
  Administered 2024-11-11: 1500 mg via INTRAVENOUS
  Filled 2024-11-11: qty 300

## 2024-11-11 NOTE — Progress Notes (Signed)
 PHARMACY - ANTICOAGULATION CONSULT NOTE  Pharmacy Consult for warfarin Indication: hx DVT/PE  Allergies[1]  Patient Measurements: Height: 5' 11 (180.3 cm) Weight: 83.5 kg (184 lb) IBW/kg (Calculated) : 75.3 HEPARIN DW (KG): 83.5  Vital Signs: Temp: 99.3 F (37.4 C) (12/14 2218) Temp Source: Oral (12/14 2218) BP: 92/53 (12/14 2230) Pulse Rate: 85 (12/14 2230)  Labs: Recent Labs    11/11/24 1803  HGB 9.7*  HCT 28.6*  PLT 62*  LABPROT 24.2*  INR 2.1*  CREATININE 1.93*    Estimated Creatinine Clearance: 33.1 mL/min (A) (by C-G formula based on SCr of 1.93 mg/dL (H)).   Medical History: 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   Clotting disorder 11/29/62   Have had three episodes of DVTs   ED (erectile dysfunction)    History of DVT (deep vein thrombosis)    Hyperlipidemia    Hypertension    Pericarditis    Pulmonary embolism [facility])      Assessment: 78 yo male present with generalized weakness, currently on treatment for CLL, on warfarin for chronic CTV/PE, pharmacy to dose.  Warfarin home dose 10mg  daily except 12.5mg  on M & Th  11/11/2024 INR 2.1, therapeutic Hgb 9.7, plts 62  Goal of Therapy:  INR 2-3    Plan:  No warfarin tonight Daily INR  Leeroy Mace RPh 11/11/2024, 11:46 PM      [1] No Known Allergies

## 2024-11-11 NOTE — ED Provider Notes (Signed)
  EMERGENCY DEPARTMENT AT [facility] Provider Note   CSN: [id number] Arrival date & time: 11/11/24  1733     Patient presents with: Weakness   Chad Gross is a 78 y.o. male.   HPI Pt bib ems from home due to increased weakness starting today. 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 Though he is initially alone he subsequently joined by his girlfriend, who corroborates history today.    Prior to Admission medications  Medication Sig Start Date End Date Taking? Authorizing Provider  acalabrutinib  maleate (CALQUENCE ) 100 MG tablet Take 1 tablet (100 mg total) by mouth 2 (two) times daily. 10/31/24   Cloretta Arley NOVAK, MD  acetaminophen  (TYLENOL ) 500 MG tablet Take 500-1,000 mg by mouth every 6 (six) hours as needed for moderate pain or headache.    [provider]  Coenzyme Q10 (COQ10) 100 MG CAPS Take 100 mg by mouth daily.    [provider]  GLUCOSAMINE CHONDROITIN COMPLX PO Take 1 tablet by mouth daily.    [provider]  hydrochlorothiazide  (HYDRODIURIL ) 12.5 MG tablet TAKE 1 TABLET BY MOUTH EVERY DAY 12/08/23   de [place], Quintin PARAS, MD  losartan  (COZAAR ) 50 MG tablet TAKE 1 TABLET BY MOUTH EVERY DAY 11/06/24   [place], Quintin PARAS, MD  meclizine  (ANTIVERT ) 12.5 MG tablet Take 1 tablet (12.5 mg total) by mouth 3 (three) times daily as needed for dizziness. 07/03/24   Cloretta Arley NOVAK, MD  pravastatin  (PRAVACHOL ) 40 MG tablet TAKE 1 TABLET BY MOUTH EVERY DAY 10/30/24   [place], Quintin PARAS, MD  traMADol  (ULTRAM ) 50 MG tablet Take 1 tablet (50 mg total) by mouth every 6 (six) hours as needed for moderate pain or severe pain. 06/01/23   de  [place], Quintin PARAS, MD  triamcinolone  acetonide 40 MG/ML SUSP 40 mg, mupirocin cream 2 % CREA 15 g Apply 1 application  topically as needed (Face).    [provider]  triamcinolone  cream (KENALOG ) 0.1 % Apply 1 Application topically 2 (two) times daily as needed. 06/08/24   [place], Raymond J, MD  warfarin (COUMADIN ) 10 MG tablet TAKE 1 TABLET BY MOUTH DAILY AT 6:00 AM 06/19/24   Cloretta Arley NOVAK, MD  warfarin (COUMADIN ) 2.5 MG tablet TAKE 2.5 MG WITH 10 MG TABLET ON MONDAY & THURSDAY FOR DOSE OF 12.5 MG 08/14/24   Cloretta Arley NOVAK, MD    Allergies: Patient has no known allergies.    Review of Systems  Updated Vital Signs BP (!) 85/54   Pulse 83   Temp (!) 100.5 F (38.1 C) (Oral)   Resp (!) 26   Ht 1.803 m (5' 11)   Wt 83.5 kg   SpO2 91%   BMI 25.66 kg/m   Physical Exam Vitals and nursing note reviewed.  Constitutional:      General: He is not in acute distress.    Appearance: He is well-developed.  HENT:     Head: Normocephalic and atraumatic.  Eyes:     Conjunctiva/sclera: Conjunctivae normal.  Cardiovascular:     Rate and Rhythm: Normal rate and regular rhythm.  Pulmonary:     Effort: Pulmonary effort is normal. Tachypnea present. No respiratory distress.     Breath sounds: No stridor.  Abdominal:     General: There is no distension.  Skin:    General: Skin is warm and dry.  Neurological:     Mental Status: He is alert and oriented to person, place, and time.     (all labs ordered are listed, but only abnormal results are displayed) Labs Reviewed  COMPREHENSIVE METABOLIC PANEL WITH GFR - Abnormal; Notable for the following components:      Result Value   Sodium 134 (*)    Glucose, Bld 110 (*)    BUN 46 (*)    Creatinine, Ser 1.93 (*)    Albumin 3.3 (*)    GFR, Estimated 35 (*)    All other components within normal limits  CBC WITH DIFFERENTIAL/PLATELET - Abnormal; Notable for the following components:   WBC 27.2 (*)    RBC 3.00 (*)    Hemoglobin  9.7 (*)    HCT 28.6 (*)    Platelets 62 (*)    nRBC 0.4 (*)    Lymphs Abs 21.6 (*)    All other components within normal limits  PROTIME-INR - Abnormal; Notable for the following components:   Prothrombin Time 24.2 (*)    INR 2.1 (*)    All other components within normal limits  URINALYSIS, W/ REFLEX TO CULTURE (INFECTION SUSPECTED) - Abnormal; Notable for the following components:   APPearance HAZY (*)    Protein, ur 30 (*)    All other components within normal limits  CULTURE, BLOOD (ROUTINE X 2)  CULTURE, BLOOD (ROUTINE X 2)  SARS CORONAVIRUS 2 BY RT PCR  I-STAT CG4 LACTIC ACID, ED  I-STAT CG4 LACTIC ACID, ED    EKG: None  Radiology: DG Chest 2 View Result Date: 11/11/2024 EXAM: 2 VIEW(S) XRAY OF THE CHEST 11/11/2024 07:41:00 PM COMPARISON: 02/10/2024 CLINICAL HISTORY: sepsis? FINDINGS: LUNGS AND PLEURA: Right lower lobe airspace opacity. Left base airspace opacity. Small right pleural effusion. No pneumothorax. HEART AND MEDIASTINUM: Aortic calcification. No acute abnormality of the cardiac and mediastinal silhouettes. BONES AND SOFT TISSUES: No acute osseous abnormality. IMPRESSION: 1. Right lower lobe and left base airspace opacities, suspicious for pneumonia. 2. Small right pleural effusion. Electronically signed by: Oneil Devonshire MD 11/11/2024 07:43 PM EST RP Workstation: [id number]     Procedures   Medications Ordered in the ED  vancomycin  (VANCOREADY) IVPB 1500 mg/300 mL (1,500 mg Intravenous New Bag/Given 11/11/24 2032)  lactated ringers  bolus 1,000 mL (0 mLs Intravenous Stopped 11/11/24 1959)  acetaminophen  (TYLENOL ) tablet 1,000 mg (1,000 mg Oral Given 11/11/24 1829)  lactated ringers  bolus 1,000 mL (0 mLs Intravenous Stopped 11/11/24 2147)  lactated ringers  bolus 1,000 mL (0 mLs Intravenous Stopped 11/11/24 2147)  ceFEPIme  (MAXIPIME ) 2 g in sodium chloride  0.9 % 100 mL IVPB (0 g Intravenous Stopped 11/11/24 2059)                                    Medical  Decision Making Male with ongoing CLL therapy and multiple other medical problems presents with weakness, listlessness. Patient is hypotensive, febrile on arrival, initial concern for sepsis with antibiotics, fluids started, monitoring commenced. Cardiac 90 sinus normal pulse ox 94% with 2 L nasal cannula abnormal   Amount and/or Complexity of Data Reviewed Independent Historian:     Details: Girlfriend, daughter  at different times External Data Reviewed: notes.    Details: Ongoing oncology notes Labs: ordered. Decision-making details documented in ED Course. Radiology: ordered and independent interpretation performed. Decision-making details documented in ED Course.  Risk OTC drugs. Prescription drug management. Decision regarding hospitalization. Diagnosis or treatment significantly limited by social determinants of health.  Today: Patient much more awake on repeat exam after initial fluids.  9:54 PM Patient is MAP now 64 X-ray with multifocal pneumonia consistent with the patient's new oxygen requirement, presentation for sepsis.  Blood pressure has improved after initial fluids, he will require admission stepdown unit for severe sepsis with hypoxia, though his initial lactic acid level is reassuring.    CRITICAL CARE Performed by: Lamar Salen Total critical care time: 35 minutes Critical care time was exclusive of separately billable procedures and treating other patients. Critical care was necessary to treat or prevent imminent or life-threatening deterioration. Critical care was time spent personally by me on the following activities: development of treatment plan with patient and/or surrogate as well as nursing, discussions with consultants, evaluation of patient's response to treatment, examination of patient, obtaining history from patient or surrogate, ordering and performing treatments and interventions, ordering and review of laboratory studies, ordering and review of  radiographic studies, pulse oximetry and re-evaluation of patient's condition.  Patient's x-ray consistent with multifocal pneumonia, consistent with patient's presentation,  Final diagnoses:  Severe sepsis (HCC)  Multifocal pneumonia     Salen Lamar, MD 11/11/24 2155

## 2024-11-11 NOTE — H&P (Signed)
 History and Physical    Chad Gross FMW:[id number] DOB: [birth year] DOA: 11/11/2024  PCP: de [place], Raymond J, MD  Patient coming from: Home  Chief Complaint: Generalized weakness  HPI: Chad Gross is a 78 y.o. 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  History provided by the patient and his girlfriend at bedside.  Reporting generalized weakness and very poor oral intake.  Also having dyspnea on exertion and cough for the past 1 month.  He was started on acalabrutinib  for his CLL 4 days ago.  Today he started having fevers.  Reports history of chronic abdominal pain related to his CLL and does not think this is any worse than his baseline.  Denies vomiting or diarrhea.  Denies any urinary symptoms.  Denies recent sick contacts.  Denies hematemesis, hematochezia, melena, or hematuria.  ED Course: Temperature 100.6 F, heart rate 103, blood pressure 90/60, and SpO2 86% on room air with EMS.  He was given Tylenol  and 1100 mL LR by EMS.  Placed on 4 L [place] and transported to the ED.  Vital signs on arrival to the ED: Temperature 100.5 F, pulse 102, respiratory rate 26, blood pressure 83/47, and SpO2 92% on 4 L [place].  Labs notable for WBC count 27.2 (absolute lymphocyte count 21.6), hemoglobin 9.7 (baseline 11-12 range), MCV 95.3, platelet count 62k (baseline 80-90k), sodium 134, BUN 46, creatinine 1.9 (baseline 1.5-1.7), normal LFTs, INR 2.1, lactic acid normal x 2, UA not suggestive of infection, blood cultures in process, SARS-CoV-2 PCR pending.  Chest x-ray showing right lower lobe and left base airspace opacity suspicious for pneumonia and small right pleural effusion.  Patient was given vancomycin , cefepime , Tylenol ,  and 3 L LR.  Review of Systems:  Review of Systems  All other systems reviewed and are negative.   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   Clotting disorder 11/29/62   Have had three episodes of DVTs   ED (erectile dysfunction)    History of DVT (deep vein thrombosis)    Hyperlipidemia    Hypertension    Pericarditis    Pulmonary embolism [facility])     Past Surgical History:  Procedure Laterality Date   CARDIAC CATHETERIZATION  01/21/2011   EF 50-55%   GREENFIELD FLITER PLACEMENT     INGUINAL HERNIA REPAIR     BILATERAL   KNEE ARTHROSCOPY Right 05/04/2018   Procedure: ARTHROSCOPY KNEE, PARTIAL MENISCECTOMY AND CHONDROPLASTY;  Surgeon: Sheril Coy, MD;  Location: [facility] OR;  Service: Orthopedics;  Laterality: Right;     reports that he has never smoked. He has never been exposed to tobacco smoke. He has never used smokeless tobacco. He reports current alcohol use. He reports that he does not use drugs.  Allergies[1]  Family History  Problem Relation Age of Onset   Breast cancer Mother    Hypertension Mother    Arthritis Mother    Cancer Mother    Heart failure Father    Pneumonia Father    Hypertension Father    Stroke Maternal Uncle     Prior to Admission medications  Medication Sig Start Date End Date Taking? Authorizing Provider  acalabrutinib  maleate (CALQUENCE ) 100 MG tablet  Take 1 tablet (100 mg total) by mouth 2 (two) times daily. 10/31/24   Cloretta Arley NOVAK, MD  acetaminophen  (TYLENOL ) 500 MG tablet Take 500-1,000 mg by mouth every 6 (six) hours as needed for moderate pain or headache.    [provider]  Coenzyme Q10 (COQ10) 100 MG CAPS Take 100 mg by mouth daily.    [provider]  GLUCOSAMINE CHONDROITIN COMPLX PO Take 1 tablet by mouth daily.    [provider]  hydrochlorothiazide  (HYDRODIURIL ) 12.5  MG tablet TAKE 1 TABLET BY MOUTH EVERY DAY 12/08/23   de [place], Quintin PARAS, MD  losartan  (COZAAR ) 50 MG tablet TAKE 1 TABLET BY MOUTH EVERY DAY 11/06/24   de [place], Quintin PARAS, MD  meclizine  (ANTIVERT ) 12.5 MG tablet Take 1 tablet (12.5 mg total) by mouth 3 (three) times daily as needed for dizziness. 07/03/24   Cloretta Arley NOVAK, MD  pravastatin  (PRAVACHOL ) 40 MG tablet TAKE 1 TABLET BY MOUTH EVERY DAY 10/30/24   de [place], Quintin PARAS, MD  traMADol  (ULTRAM ) 50 MG tablet Take 1 tablet (50 mg total) by mouth every 6 (six) hours as needed for moderate pain or severe pain. 06/01/23   de [place], Quintin PARAS, MD  triamcinolone  acetonide 40 MG/ML SUSP 40 mg, mupirocin cream 2 % CREA 15 g Apply 1 application  topically as needed (Face).    [provider]  triamcinolone  cream (KENALOG ) 0.1 % Apply 1 Application topically 2 (two) times daily as needed. 06/08/24   [place], Raymond J, MD  warfarin (COUMADIN ) 10 MG tablet TAKE 1 TABLET BY MOUTH DAILY AT 6:00 AM 06/19/24   Cloretta Arley NOVAK, MD  warfarin (COUMADIN ) 2.5 MG tablet TAKE 2.5 MG WITH 10 MG TABLET ON MONDAY & THURSDAY FOR DOSE OF 12.5 MG 08/14/24   Cloretta Arley NOVAK, MD    Physical Exam: Vitals:   11/11/24 2145 11/11/24 2217 11/11/24 2218 11/11/24 2230  BP: (!) 85/54 (!) 92/58  (!) 92/53  Pulse: 83 84  85  Resp:  18    Temp:   99.3 F (37.4 C)   TempSrc:   Oral   SpO2: 91% 93%  94%  Weight:      Height:        Physical Exam Vitals reviewed.  Constitutional:      General: He is not in acute distress. HENT:     Head: Normocephalic and atraumatic.     Mouth/Throat:     Mouth: Mucous membranes are dry.     Comments: Very dry mucous membranes Eyes:     Extraocular Movements: Extraocular movements intact.  Cardiovascular:     Rate and Rhythm: Normal rate and regular rhythm.     Heart sounds: Normal heart sounds.  Pulmonary:     Effort: Pulmonary effort is normal. No respiratory distress.     Breath sounds: No wheezing or rales.  Abdominal:      General: Bowel sounds are normal.     Palpations: Abdomen is soft.     Tenderness: There is no abdominal tenderness. There is no guarding.  Musculoskeletal:     Cervical back: Normal range of motion. No rigidity.     Right lower leg: No edema.     Left lower leg: No edema.  Skin:    General: Skin is warm and dry.  Neurological:     General: No focal deficit present.     Mental Status: He is alert and oriented to person, place, and time.  Labs on Admission: I have personally reviewed following labs and imaging studies  CBC: Recent Labs  Lab 11/07/24 1330 11/11/24 1803  WBC 25.7* 27.2*  NEUTROABS 2.4 4.5  HGB 11.6* 9.7*  HCT 34.5* 28.6*  MCV 94.0 95.3  PLT 81* 62*   Basic Metabolic Panel: Recent Labs  Lab 11/07/24 1330 11/11/24 1803  NA 136 134*  K 4.1 4.6  CL 101 104  CO2 24 22  GLUCOSE 88 110*  BUN 27* 46*  CREATININE 1.79* 1.93*  CALCIUM 10.6* 9.6   GFR: Estimated Creatinine Clearance: 33.1 mL/min (A) (by C-G formula based on SCr of 1.93 mg/dL (H)). Liver Function Tests: Recent Labs  Lab 11/07/24 1330 11/11/24 1803  AST 25 26  ALT 12 12  ALKPHOS 59 40  BILITOT 0.6 0.6  PROT 8.9* 7.3  ALBUMIN 3.8 3.3*   No results for input(s): LIPASE, AMYLASE in the last 168 hours. No results for input(s): AMMONIA in the last 168 hours. Coagulation Profile: Recent Labs  Lab 11/07/24 1408 11/11/24 1803  INR 2.4* 2.1*   Cardiac Enzymes: No results for input(s): CKTOTAL, CKMB, CKMBINDEX, TROPONINI in the last 168 hours. BNP (last 3 results) No results for input(s): PROBNP in the last 8760 hours. HbA1C: No results for input(s): HGBA1C in the last 72 hours. CBG: No results for input(s): GLUCAP in the last 168 hours. Lipid Profile: No results for input(s): CHOL, HDL, LDLCALC, TRIG, CHOLHDL, LDLDIRECT in the last 72 hours. Thyroid  Function Tests: No results for input(s): TSH, T4TOTAL, FREET4, T3FREE, THYROIDAB in the  last 72 hours. Anemia Panel: No results for input(s): VITAMINB12, FOLATE, FERRITIN, TIBC, IRON, RETICCTPCT in the last 72 hours. Urine analysis:    Component Value Date/Time   COLORURINE YELLOW 11/11/2024 1837   APPEARANCEUR HAZY (A) 11/11/2024 1837   LABSPEC 1.014 11/11/2024 1837   PHURINE 5.0 11/11/2024 1837   GLUCOSEU NEGATIVE 11/11/2024 1837   GLUCOSEU NEGATIVE 02/20/2020 1431   HGBUR NEGATIVE 11/11/2024 1837   BILIRUBINUR NEGATIVE 11/11/2024 1837   KETONESUR NEGATIVE 11/11/2024 1837   PROTEINUR 30 (A) 11/11/2024 1837   UROBILINOGEN 0.2 02/20/2020 1431   NITRITE NEGATIVE 11/11/2024 1837   LEUKOCYTESUR NEGATIVE 11/11/2024 1837    Radiological Exams on Admission: DG Chest 2 View Result Date: 11/11/2024 EXAM: 2 VIEW(S) XRAY OF THE CHEST 11/11/2024 07:41:00 PM COMPARISON: 02/10/2024 CLINICAL HISTORY: sepsis? FINDINGS: LUNGS AND PLEURA: Right lower lobe airspace opacity. Left base airspace opacity. Small right pleural effusion. No pneumothorax. HEART AND MEDIASTINUM: Aortic calcification. No acute abnormality of the cardiac and mediastinal silhouettes. BONES AND SOFT TISSUES: No acute osseous abnormality. IMPRESSION: 1. Right lower lobe and left base airspace opacities, suspicious for pneumonia. 2. Small right pleural effusion. Electronically signed by: Oneil Devonshire MD 11/11/2024 07:43 PM EST RP Workstation: [id number]    Assessment and Plan  Severe sepsis secondary to CAP Presenting with fever, tachycardia, tachypnea, hypotension, acute hypoxemia, and worsening leukocytosis on labs. Chest x-ray concerning for bibasilar pneumonia.  Blood pressure has now improved after IV fluid boluses.  Systolic now above 90 and most recent MAP 71.  Lactate normal x 2.  Continue maintenance IV fluids as he appears dehydrated.  Continue vancomycin  and cefepime .  SARS-CoV-2 PCR pending.  Currently stable on 2 L .  Continue supplemental oxygen, wean as tolerated.  Trend WBC count.  Follow-up  blood cultures.  MRSA PCR screen.  Strep pneumo/Legionella urinary antigens.  AKI on CKD stage III Likely prerenal in etiology in the setting of sepsis/hypotension/dehydration.  Continue maintenance  IV fluids and monitor renal function.  Avoid nephrotoxic agents/hold home hydrochlorothiazide  and losartan .  Mild hyponatremia In the setting of poor p.o. intake.  Continue maintenance IV fluids/normal saline and monitor sodium level.  Generalized weakness PT/OT eval, fall precautions.  CLL He was maintained off of systemic therapy since he completed a course of Bendamustine /rituximab  in November 2024 but given recent clinical and radiographic evidence of disease progression he was started on acalabrutinib  4 days ago (hold at this time in the setting of active infection).  Chronic DVT/history of PE Patient is on indefinite anticoagulation with Coumadin .  INR 2.1.  Does have chronic anemia and thrombocytopenia but no overt bleeding.  Coumadin  dosing per pharmacy.  Chronic anemia and thrombocytopenia In the setting of CLL/cancer treatment.  No overt bleeding.  Monitor CBC.  Hypertension Hold antihypertensives at this time.  Hyperlipidemia Continue pravastatin .  DVT prophylaxis: Coumadin  Code Status: DNR pre-arrest interventions desired (discussed with the patient) Family Communication: Girlfriend at bedside. Level of care: Step Down Unit Admission status: It is my clinical opinion that admission to INPATIENT is reasonable and necessary because of the expectation that this patient will require hospital care that crosses at least 2 midnights to treat this condition based on the medical complexity of the problems presented.  Given the aforementioned information, the predictability of an adverse outcome is felt to be significant.  Editha Ram MD [facility]  If 7PM-7AM, please contact night-coverage [email or web address]  11/11/2024, 10:57 PM       [1] No Known Allergies

## 2024-11-11 NOTE — ED Triage Notes (Signed)
 Pt bib ems from home due to increased weakness starting today. Chemo pt - CLL Hx pleural effusion 1 month ago  Baseline room air   Per EMS BP 90/60 HR103 SpO2 86% room air - 93% 4LNC T 100.6 temporal CBG 93 Capnography 15   650 mg tylenol  w/ EMS 1100 ML LR

## 2024-11-11 NOTE — H&P (Incomplete)
 History and Physical    BYARD CARRANZA FMW:[id number] DOB: 1946/11/18 DOA: 11/11/2024  PCP: de [place], Raymond J, MD  Patient coming from: Home  Chief Complaint: Generalized weakness  HPI: Chad Gross is a 78 y.o. 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  History provided by the patient and his girlfriend at bedside.  Reporting generalized weakness and very poor oral intake.  Also having dyspnea on exertion and cough for the past 1 month.  He was started on acalabrutinib  for his CLL 4 days ago.  Today he started having fevers.  Reports history of chronic abdominal pain related to his CLL and does not think this is any worse than his baseline.  Denies vomiting or diarrhea.  Denies any urinary symptoms.  Denies recent sick contacts.  ED Course: Temperature 100.6 F, heart rate 103, blood pressure 90/60, and SpO2 96% on room air with EMS.  He was given Tylenol  and 1100 mL LR by EMS.  Placed on 4 L [place] and transported to the ED.  Vital signs on arrival to the ED: Temperature 100.5 F, pulse 102, respiratory rate 26, blood pressure 83/47, and SpO2 92% on 4 L [place].  Labs notable for WBC count 27.2 (absolute lymphocyte count 21.6), hemoglobin 9.7 (baseline 11-12 range), MCV 95.3, platelet count 62k (baseline 80-90k), sodium 134, BUN 46, creatinine 1.9 (baseline 1.5-1.7), normal LFTs, INR 2.1, lactic acid normal x 2, UA not suggestive of infection, blood cultures in process, SARS-CoV-2 PCR pending.  Chest x-ray showing right lower lobe and left base airspace opacity suspicious for pneumonia and small right pleural effusion.  Patient was given vancomycin , cefepime , Tylenol , and 3 L LR.  Review of Systems:  Review of Systems   All other systems reviewed and are negative.   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   Clotting disorder 11/29/62   Have had three episodes of DVTs   ED (erectile dysfunction)    History of DVT (deep vein thrombosis)    Hyperlipidemia    Hypertension    Pericarditis    Pulmonary embolism [facility])     Past Surgical History:  Procedure Laterality Date   CARDIAC CATHETERIZATION  01/21/2011   EF 50-55%   GREENFIELD FLITER PLACEMENT     INGUINAL HERNIA REPAIR     BILATERAL   KNEE ARTHROSCOPY Right 05/04/2018   Procedure: ARTHROSCOPY KNEE, PARTIAL MENISCECTOMY AND CHONDROPLASTY;  Surgeon: Sheril Coy, MD;  Location: [facility] OR;  Service: Orthopedics;  Laterality: Right;     reports that he has never smoked. He has never been exposed to tobacco smoke. He has never used smokeless tobacco. He reports current alcohol use. He reports that he does not use drugs.  Allergies[1]  Family History  Problem Relation Age of Onset   Breast cancer Mother    Hypertension Mother    Arthritis Mother    Cancer Mother    Heart failure Father    Pneumonia Father    Hypertension Father    Stroke Maternal Uncle     Prior to Admission medications  Medication Sig Start Date End Date Taking? Authorizing Provider  acalabrutinib  maleate (CALQUENCE ) 100 MG tablet Take 1 tablet (100 mg total) by  mouth 2 (two) times daily. 10/31/24   Cloretta Arley NOVAK, MD  acetaminophen  (TYLENOL ) 500 MG tablet Take 500-1,000 mg by mouth every 6 (six) hours as needed for moderate pain or headache.    [provider]  Coenzyme Q10 (COQ10) 100 MG CAPS Take 100 mg by mouth daily.    [provider]  GLUCOSAMINE CHONDROITIN COMPLX PO Take 1 tablet by mouth daily.    [provider]  hydrochlorothiazide  (HYDRODIURIL ) 12.5 MG tablet TAKE 1 TABLET BY MOUTH  EVERY DAY 12/08/23   de [place], Quintin PARAS, MD  losartan  (COZAAR ) 50 MG tablet TAKE 1 TABLET BY MOUTH EVERY DAY 11/06/24   de [place], Quintin PARAS, MD  meclizine  (ANTIVERT ) 12.5 MG tablet Take 1 tablet (12.5 mg total) by mouth 3 (three) times daily as needed for dizziness. 07/03/24   Cloretta Arley NOVAK, MD  pravastatin  (PRAVACHOL ) 40 MG tablet TAKE 1 TABLET BY MOUTH EVERY DAY 10/30/24   [place], Quintin PARAS, MD  traMADol  (ULTRAM ) 50 MG tablet Take 1 tablet (50 mg total) by mouth every 6 (six) hours as needed for moderate pain or severe pain. 06/01/23   [place], Raymond J, MD  triamcinolone  acetonide 40 MG/ML SUSP 40 mg, mupirocin cream 2 % CREA 15 g Apply 1 application  topically as needed (Face).    [provider]  triamcinolone  cream (KENALOG ) 0.1 % Apply 1 Application topically 2 (two) times daily as needed. 06/08/24   [place], Raymond J, MD  warfarin (COUMADIN ) 10 MG tablet TAKE 1 TABLET BY MOUTH DAILY AT 6:00 AM 06/19/24   Cloretta Arley NOVAK, MD  warfarin (COUMADIN ) 2.5 MG tablet TAKE 2.5 MG WITH 10 MG TABLET ON MONDAY & THURSDAY FOR DOSE OF 12.5 MG 08/14/24   Cloretta Arley NOVAK, MD    Physical Exam: Vitals:   11/11/24 2145 11/11/24 2217 11/11/24 2218 11/11/24 2230  BP: (!) 85/54 (!) 92/58  (!) 92/53  Pulse: 83 84  85  Resp:  18    Temp:   99.3 F (37.4 C)   TempSrc:   Oral   SpO2: 91% 93%  94%  Weight:      Height:        Physical Exam Vitals reviewed.  Constitutional:      General: He is not in acute distress. HENT:     Head: Normocephalic and atraumatic.     Mouth/Throat:     Mouth: Mucous membranes are dry.     Comments: Very dry mucous membranes Eyes:     Extraocular Movements: Extraocular movements intact.  Cardiovascular:     Rate and Rhythm: Normal rate and regular rhythm.     Heart sounds: Normal heart sounds.  Pulmonary:     Effort: Pulmonary effort is normal. No respiratory distress.     Breath sounds: No wheezing or rales.  Abdominal:     General: Bowel sounds are normal.      Palpations: Abdomen is soft.     Tenderness: There is no abdominal tenderness. There is no guarding.  Musculoskeletal:     Cervical back: Normal range of motion. No rigidity.     Right lower leg: No edema.     Left lower leg: No edema.  Skin:    General: Skin is warm and dry.  Neurological:     General: No focal deficit present.     Mental Status: He is alert and oriented to person, place, and time.     Labs on Admission: I have  personally reviewed following labs and imaging studies  CBC: Recent Labs  Lab 11/07/24 1330 11/11/24 1803  WBC 25.7* 27.2*  NEUTROABS 2.4 4.5  HGB 11.6* 9.7*  HCT 34.5* 28.6*  MCV 94.0 95.3  PLT 81* 62*   Basic Metabolic Panel: Recent Labs  Lab 11/07/24 1330 11/11/24 1803  NA 136 134*  K 4.1 4.6  CL 101 104  CO2 24 22  GLUCOSE 88 110*  BUN 27* 46*  CREATININE 1.79* 1.93*  CALCIUM 10.6* 9.6   GFR: Estimated Creatinine Clearance: 33.1 mL/min (A) (by C-G formula based on SCr of 1.93 mg/dL (H)). Liver Function Tests: Recent Labs  Lab 11/07/24 1330 11/11/24 1803  AST 25 26  ALT 12 12  ALKPHOS 59 40  BILITOT 0.6 0.6  PROT 8.9* 7.3  ALBUMIN 3.8 3.3*   No results for input(s): LIPASE, AMYLASE in the last 168 hours. No results for input(s): AMMONIA in the last 168 hours. Coagulation Profile: Recent Labs  Lab 11/07/24 1408 11/11/24 1803  INR 2.4* 2.1*   Cardiac Enzymes: No results for input(s): CKTOTAL, CKMB, CKMBINDEX, TROPONINI in the last 168 hours. BNP (last 3 results) No results for input(s): PROBNP in the last 8760 hours. HbA1C: No results for input(s): HGBA1C in the last 72 hours. CBG: No results for input(s): GLUCAP in the last 168 hours. Lipid Profile: No results for input(s): CHOL, HDL, LDLCALC, TRIG, CHOLHDL, LDLDIRECT in the last 72 hours. Thyroid  Function Tests: No results for input(s): TSH, T4TOTAL, FREET4, T3FREE, THYROIDAB in the last 72 hours. Anemia Panel: No  results for input(s): VITAMINB12, FOLATE, FERRITIN, TIBC, IRON, RETICCTPCT in the last 72 hours. Urine analysis:    Component Value Date/Time   COLORURINE YELLOW 11/11/2024 1837   APPEARANCEUR HAZY (A) 11/11/2024 1837   LABSPEC 1.014 11/11/2024 1837   PHURINE 5.0 11/11/2024 1837   GLUCOSEU NEGATIVE 11/11/2024 1837   GLUCOSEU NEGATIVE 02/20/2020 1431   HGBUR NEGATIVE 11/11/2024 1837   BILIRUBINUR NEGATIVE 11/11/2024 1837   KETONESUR NEGATIVE 11/11/2024 1837   PROTEINUR 30 (A) 11/11/2024 1837   UROBILINOGEN 0.2 02/20/2020 1431   NITRITE NEGATIVE 11/11/2024 1837   LEUKOCYTESUR NEGATIVE 11/11/2024 1837    Radiological Exams on Admission: DG Chest 2 View Result Date: 11/11/2024 EXAM: 2 VIEW(S) XRAY OF THE CHEST 11/11/2024 07:41:00 PM COMPARISON: 02/10/2024 CLINICAL HISTORY: sepsis? FINDINGS: LUNGS AND PLEURA: Right lower lobe airspace opacity. Left base airspace opacity. Small right pleural effusion. No pneumothorax. HEART AND MEDIASTINUM: Aortic calcification. No acute abnormality of the cardiac and mediastinal silhouettes. BONES AND SOFT TISSUES: No acute osseous abnormality. IMPRESSION: 1. Right lower lobe and left base airspace opacities, suspicious for pneumonia. 2. Small right pleural effusion. Electronically signed by: Oneil Devonshire MD 11/11/2024 07:43 PM EST RP Workstation: [id number]    Assessment and Plan  Severe sepsis secondary to CAP Presenting with fever, tachycardia, tachypnea, hypotension, and acute hypoxemia.  Worsening leukocytosis on labs. Chest x-ray showing right lower lobe and left base airspace opacity suspicious for pneumonia and small right pleural effusion.  Blood pressure has now improved after IV fluid boluses.  Systolic now above 90 and most recent MAP 71.  Lactate normal x 2.  Continue maintenance IV fluids as he appears dehydrated.  Continue vancomycin  and cefepime .  SARS-CoV-2 PCR pending.  Generalized weakness PT/OT eval, fall  precautions.  CLL He was maintained off of systemic therapy since he completed a course of Bendamustine /rituximab  in November 2024 but given recent clinical and radiographic evidence of disease progression he was started on  acalabrutinib .  Hold acalabrutinib  at this time in the setting of active infection.  Chronic DVT/history of PE Patient is on indefinite anticoagulation with Coumadin .  INR 2.1.  Does have chronic anemia and thrombocytopenia but no overt bleeding.  Coumadin  dosing per pharmacy.  Chronic anemia and thrombocytopenia  Hypertension Hold antihypertensives at this time.  Hyperlipidemia    ED Course: Temperature 100.6 F, heart rate 103, blood pressure 90/60, and SpO2 96% on room air with EMS.  He was given Tylenol  and 1100 mL LR by EMS.  Placed on 4 L [place] and transported to the ED.  Vital signs on arrival to the ED: Temperature 100.5 F, pulse 102, respiratory rate 26, blood pressure 83/47, and SpO2 92% on 4 L [place].  Labs notable for WBC count 27.2 (absolute lymphocyte count 21.6), hemoglobin 9.7 (baseline 11-12 range), MCV 95.3, platelet count 62k (baseline 80-90k), sodium 134, BUN 46, creatinine 1.9 (baseline 1.5-1.7), normal LFTs, INR 2.1, lactic acid normal x 2, UA not suggestive of infection, blood cultures in process, SARS-CoV-2 PCR pending.    Patient was given vancomycin , cefepime , Tylenol , and 3 L LR.  DVT prophylaxis: {Blank single:19197::Lovenox,SQ Heparin,IV heparin gtt,Xarelto,Eliquis,Coumadin ,SCDs,***} Code Status: {Blank single:19197::Full Code,DNR,DNR/DNI,Comfort Care,***} Family Communication: ***  Consults called: ***  Level of care: {Blank single:19197::Med-Surg,Telemetry bed,Progressive Care Unit,Step Down Unit} Admission status: *** Time Spent: 75+ minutes***  Editha Ram MD [facility]  If 7PM-7AM, please contact night-coverage [email or web address]  11/11/2024, 10:57 PM         [1] No Known Allergies

## 2024-11-12 ENCOUNTER — Encounter: Payer: Self-pay | Admitting: Oncology

## 2024-11-12 ENCOUNTER — Inpatient Hospital Stay: Payer: MEDICARE | Admitting: Nutrition

## 2024-11-12 DIAGNOSIS — Z806 Family history of leukemia: Secondary | ICD-10-CM | POA: Diagnosis not present

## 2024-11-12 DIAGNOSIS — E785 Hyperlipidemia, unspecified: Secondary | ICD-10-CM | POA: Diagnosis present

## 2024-11-12 DIAGNOSIS — Z832 Family history of diseases of the blood and blood-forming organs and certain disorders involving the immune mechanism: Secondary | ICD-10-CM | POA: Diagnosis not present

## 2024-11-12 DIAGNOSIS — D630 Anemia in neoplastic disease: Secondary | ICD-10-CM | POA: Diagnosis present

## 2024-11-12 DIAGNOSIS — A419 Sepsis, unspecified organism: Secondary | ICD-10-CM | POA: Diagnosis not present

## 2024-11-12 DIAGNOSIS — N1831 Chronic kidney disease, stage 3a: Secondary | ICD-10-CM | POA: Diagnosis present

## 2024-11-12 DIAGNOSIS — Z86711 Personal history of pulmonary embolism: Secondary | ICD-10-CM | POA: Diagnosis not present

## 2024-11-12 DIAGNOSIS — J189 Pneumonia, unspecified organism: Secondary | ICD-10-CM | POA: Diagnosis present

## 2024-11-12 DIAGNOSIS — R531 Weakness: Secondary | ICD-10-CM

## 2024-11-12 DIAGNOSIS — Z1152 Encounter for screening for COVID-19: Secondary | ICD-10-CM | POA: Diagnosis not present

## 2024-11-12 DIAGNOSIS — I959 Hypotension, unspecified: Secondary | ICD-10-CM | POA: Diagnosis present

## 2024-11-12 DIAGNOSIS — Z66 Do not resuscitate: Secondary | ICD-10-CM | POA: Diagnosis present

## 2024-11-12 DIAGNOSIS — Z803 Family history of malignant neoplasm of breast: Secondary | ICD-10-CM | POA: Diagnosis not present

## 2024-11-12 DIAGNOSIS — D6959 Other secondary thrombocytopenia: Secondary | ICD-10-CM | POA: Diagnosis present

## 2024-11-12 DIAGNOSIS — R6520 Severe sepsis without septic shock: Secondary | ICD-10-CM | POA: Diagnosis present

## 2024-11-12 DIAGNOSIS — Z8249 Family history of ischemic heart disease and other diseases of the circulatory system: Secondary | ICD-10-CM | POA: Diagnosis not present

## 2024-11-12 DIAGNOSIS — E860 Dehydration: Secondary | ICD-10-CM | POA: Diagnosis present

## 2024-11-12 DIAGNOSIS — E871 Hypo-osmolality and hyponatremia: Secondary | ICD-10-CM | POA: Diagnosis present

## 2024-11-12 DIAGNOSIS — Z8261 Family history of arthritis: Secondary | ICD-10-CM | POA: Diagnosis not present

## 2024-11-12 DIAGNOSIS — N179 Acute kidney failure, unspecified: Secondary | ICD-10-CM

## 2024-11-12 DIAGNOSIS — C9110 Chronic lymphocytic leukemia of B-cell type not having achieved remission: Secondary | ICD-10-CM | POA: Diagnosis present

## 2024-11-12 DIAGNOSIS — I129 Hypertensive chronic kidney disease with stage 1 through stage 4 chronic kidney disease, or unspecified chronic kidney disease: Secondary | ICD-10-CM | POA: Diagnosis present

## 2024-11-12 DIAGNOSIS — L89153 Pressure ulcer of sacral region, stage 3: Secondary | ICD-10-CM | POA: Diagnosis present

## 2024-11-12 DIAGNOSIS — Z823 Family history of stroke: Secondary | ICD-10-CM | POA: Diagnosis not present

## 2024-11-12 DIAGNOSIS — A4151 Sepsis due to Escherichia coli [E. coli]: Secondary | ICD-10-CM | POA: Diagnosis present

## 2024-11-12 DIAGNOSIS — Z7901 Long term (current) use of anticoagulants: Secondary | ICD-10-CM | POA: Diagnosis not present

## 2024-11-12 LAB — BLOOD CULTURE ID PANEL (REFLEXED) - BCID2

## 2024-11-12 LAB — CBC
HCT: 26.6 % — ABNORMAL LOW (ref 39.0–52.0)
Hemoglobin: 8.8 g/dL — ABNORMAL LOW (ref 13.0–17.0)
MCH: 31.8 pg (ref 26.0–34.0)
MCHC: 33.1 g/dL (ref 30.0–36.0)
MCV: 96 fL (ref 80.0–100.0)
Platelets: 51 K/uL — ABNORMAL LOW (ref 150–400)
RBC: 2.77 MIL/uL — ABNORMAL LOW (ref 4.22–5.81)
RDW: 14.8 % (ref 11.5–15.5)
WBC: 30.5 K/uL — ABNORMAL HIGH (ref 4.0–10.5)
nRBC: 0.2 % (ref 0.0–0.2)

## 2024-11-12 LAB — BASIC METABOLIC PANEL WITH GFR
Anion gap: 9 (ref 5–15)
BUN: 46 mg/dL — ABNORMAL HIGH (ref 8–23)
CO2: 21 mmol/L — ABNORMAL LOW (ref 22–32)
Calcium: 9 mg/dL (ref 8.9–10.3)
Chloride: 105 mmol/L (ref 98–111)
Creatinine, Ser: 1.94 mg/dL — ABNORMAL HIGH (ref 0.61–1.24)
GFR, Estimated: 35 mL/min — ABNORMAL LOW (ref >60)
Glucose, Bld: 97 mg/dL (ref 70–99)
Potassium: 4.3 mmol/L (ref 3.5–5.1)
Sodium: 135 mmol/L (ref 135–145)

## 2024-11-12 LAB — SARS CORONAVIRUS 2 BY RT PCR: SARS Coronavirus 2 by RT PCR: NEGATIVE

## 2024-11-12 LAB — PROTIME-INR
INR: 2.7 — ABNORMAL HIGH (ref 0.8–1.2)
Prothrombin Time: 30.4 s — ABNORMAL HIGH (ref 11.4–15.2)

## 2024-11-12 LAB — STREP PNEUMONIAE URINARY ANTIGEN: Strep Pneumo Urinary Antigen: NEGATIVE

## 2024-11-12 LAB — MRSA NEXT GEN BY PCR, NASAL: MRSA by PCR Next Gen: NOT DETECTED

## 2024-11-12 MED ORDER — ACETAMINOPHEN 325 MG PO TABS
650.0000 mg | ORAL_TABLET | Freq: Four times a day (QID) | ORAL | Status: DC | PRN
  Administered 2024-11-12 – 2024-11-14 (×5): 650 mg via ORAL
  Filled 2024-11-12 (×5): qty 2

## 2024-11-12 MED ORDER — ORAL CARE MOUTH RINSE: 15.0000 mL | OROMUCOSAL | Status: DC | PRN

## 2024-11-12 MED ORDER — VANCOMYCIN HCL 1500 MG/300ML IV SOLN: 1500.0000 mg | INTRAVENOUS | Status: DC

## 2024-11-12 MED ORDER — ZINC OXIDE 40 % EX OINT
TOPICAL_OINTMENT | Freq: Two times a day (BID) | CUTANEOUS | Status: DC
  Filled 2024-11-12: qty 57

## 2024-11-12 MED ORDER — SODIUM CHLORIDE 0.9 % IV SOLN
2.0000 g | INTRAVENOUS | Status: DC
  Administered 2024-11-12 – 2024-11-13 (×2): 2 g via INTRAVENOUS
  Filled 2024-11-12 (×2): qty 20

## 2024-11-12 MED ORDER — ACETAMINOPHEN 650 MG RE SUPP: 650.0000 mg | Freq: Four times a day (QID) | RECTAL | Status: DC | PRN

## 2024-11-12 MED ORDER — ENSURE PLUS HIGH PROTEIN PO LIQD
237.0000 mL | Freq: Two times a day (BID) | ORAL | Status: DC
  Administered 2024-11-13 – 2024-11-15 (×6): 237 mL via ORAL

## 2024-11-12 MED ORDER — CARMEX CLASSIC LIP BALM EX OINT
TOPICAL_OINTMENT | Freq: Once | CUTANEOUS | Status: AC
  Administered 2024-11-12: 13:00:00 1 via TOPICAL
  Filled 2024-11-12: qty 10

## 2024-11-12 MED ORDER — SODIUM CHLORIDE 0.9 % IV SOLN
2.0000 g | Freq: Two times a day (BID) | INTRAVENOUS | Status: DC
  Administered 2024-11-12: 07:00:00 2 g via INTRAVENOUS
  Filled 2024-11-12: qty 12.5

## 2024-11-12 MED ORDER — SODIUM CHLORIDE 0.9 % IV SOLN: INTRAVENOUS | Status: AC

## 2024-11-12 NOTE — ED Notes (Signed)
 Pt trialed off oxygen to RA, currently maintaining 92% and above, will re-apply if de-stat. Pt currently denies any shortness of breath, respiratory distress. NAD noted.

## 2024-11-12 NOTE — Evaluation (Signed)
 Physical Therapy Evaluation Patient Details Name: Chad Gross MRN: [id number] DOB: [birth year] Today's Date: 11/12/2024  History of Present Illness  Chad Gross is a 78 y.o. male resenting with a chief complaint of generalized weakness, fever, sepsis due to Pneumonia and right pleural effusion. 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  Pt currently with functional limitations due to the deficits listed below (see PT Problem List). Pt will benefit from acute skilled PT to increase their independence and safety with mobility to allow discharge.     The patient is alert and O x 4. Patient's partner present.  Patient  does demonstrate weakness, able to mobilize, stand and step to recliner with min assistance using a RW.  Patient scott report that patient has been very weak, unable to  drive and progressively unable to ambulate due to hight Calcium.  Prior to  that, he was independent. Patient plans to return home with support from family, Partner works. If progress is slower than anticipated, patient may benefit from short rehab.  SPO2 on 5 L remained > 95%, HR 80's. BP after transfer 108/63    If plan is discharge home, recommend the following: A little help with walking and/or transfers;A lot of help with bathing/dressing/bathroom;Assistance with cooking/housework;Help with stairs or ramp for entrance;Assist for transportation   Can travel by private vehicle        Equipment Recommendations None recommended by PT  Recommendations for Other Services       Functional Status Assessment Patient has had a recent decline in their functional status and demonstrates the ability to make significant improvements in function in a reasonable and  predictable amount of time.     Precautions / Restrictions Precautions Precautions: Fall Precaution/Restrictions Comments: monitor sats, BP Restrictions Weight Bearing Restrictions Per Provider Order: No      Mobility  Bed Mobility Overal bed mobility: Needs Assistance Bed Mobility: Supine to Sit     Supine to sit: Mod assist     General bed mobility comments: extra time and  assist with legs and trunk and scooting    Transfers Overall transfer level: Needs assistance Equipment used: Rolling walker (2 wheels) Transfers: Sit to/from Stand, Bed to chair/wheelchair/BSC Sit to Stand: Min assist   Step pivot transfers: Min assist       General transfer comment: steady assist to  stand and step to recliner    Ambulation/Gait                  Stairs            Wheelchair Mobility     Tilt Bed    Modified Rankin (Stroke Patients Only)       Balance Overall balance assessment: Needs assistance Sitting-balance support: Feet supported, Bilateral upper extremity supported Sitting balance-Leahy Scale: Fair Sitting balance - Comments: tends to lean forward   Standing balance support: Reliant on assistive device for balance, During functional activity, Bilateral upper extremity supported Standing balance-Leahy Scale: Poor                               Pertinent Vitals/Pain Pain Assessment Pain Assessment: Faces Faces Pain Scale: Hurts little more Pain Location: both thighs Pain Descriptors / Indicators: Aching Pain Intervention(s): Monitored during session    Home Living  Family/patient expects to be discharged to:: Private residence Living Arrangements: Spouse/significant other Available Help at Discharge: Family Type of Home: House Home Access: Level entry       Home Layout: One level Home Equipment: Agricultural Consultant (2 wheels) Additional Comments: has neight=bor and daughter close when sig other  working    Prior Function  Prior Level of Function : Independent/Modified Independent;Driving             Mobility Comments: has not driven for a while due to High calcium, per pt ADLs Comments: usually independent.     Extremity/Trunk Assessment   Upper Extremity Assessment Upper Extremity Assessment: Defer to OT evaluation    Lower Extremity Assessment Lower Extremity Assessment: Generalized weakness    Cervical / Trunk Assessment Cervical / Trunk Assessment: Kyphotic  Communication   Communication Communication: No apparent difficulties    Cognition Arousal: Alert Behavior During Therapy: WFL for tasks assessed/performed   PT - Cognitive impairments: No apparent impairments                                 Cueing       General Comments      Exercises     Assessment/Plan    PT Assessment Patient needs continued PT services  PT Problem List Decreased strength;Decreased activity tolerance;Decreased mobility       PT Treatment Interventions DME instruction;Gait training;Functional mobility training;Therapeutic activities;Therapeutic exercise;Patient/family education    PT Goals (Current goals can be found in the Care Plan section)  Acute Rehab PT Goals Patient Stated Goal: go home PT Goal Formulation: With patient/family Time For Goal Achievement: 11/26/24 Potential to Achieve Goals: Good    Frequency Min 3X/week     Co-evaluation               AM-PAC PT 6 Clicks Mobility  Outcome Measure Help needed turning from your back to your side while in a flat bed without using bedrails?: A Little Help needed moving from lying on your back to sitting on the side of a flat bed without using bedrails?: A Lot Help needed moving to and from a bed to a chair (including a wheelchair)?: A Lot Help needed standing up from a chair using your arms (e.g., wheelchair or bedside chair)?: A Lot Help needed to walk in hospital room?: Total Help needed climbing 3-5 steps with a  railing? : Total 6 Click Score: 11    End of Session Equipment Utilized During Treatment: Gait belt Activity Tolerance: Patient tolerated treatment well Patient left: in chair;with call bell/phone within reach;with family/visitor present Nurse Communication: Mobility status PT Visit Diagnosis: Unsteadiness on feet (R26.81);Muscle weakness (generalized) (M62.81);Difficulty in walking, not elsewhere classified (R26.2)    Time: 9152-9079 PT Time Calculation (min) (ACUTE ONLY): 33 min   Charges:   PT Evaluation $PT Eval Low Complexity: 1 Low PT Treatments $Therapeutic Activity: 8-22 mins PT General Charges $$ ACUTE PT VISIT: 1 Visit         Darice Potters PT Acute Rehabilitation Services Office [phone]   Potters Darice Norris 11/12/2024, 9:31 AM

## 2024-11-12 NOTE — Progress Notes (Addendum)
 PROGRESS NOTE  LARKIN MORELOS FMW:[id number] DOB: [birth year] DOA: 11/11/2024 PCP: de [place], Raymond J, MD  Hospital Course/Subjective: Chad Gross is a 78 y.o. 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  Seen and examined in the ER with his wife at the bedside, he is doing well and has no complaints.  Remains on 2 L nasal cannula oxygen.  He is hungry and wondering if he can eat.  Assessment/Plan:    Severe sepsis secondary to CAP Presenting with fever, tachycardia, tachypnea, hypotension, acute hypoxemia, and worsening leukocytosis on labs. Chest x-ray concerning for bibasilar pneumonia.  Blood pressure has now improved after IV fluid boluses.  Systolic now above 90 and most recent MAP 71.  Lactate normal x 2.  Continue maintenance IV fluids as he appears dehydrated.  Continue vancomycin  and cefepime .  SARS-CoV-2 PCR negative.  Currently stable on 2 L [place].  Continue supplemental oxygen, wean as tolerated.  Trend WBC count.  Follow-up blood cultures.  MRSA PCR screen negative.  Strep pneumo negative/Legionella urinary antigens pending.   AKI on CKD stage IIIa Likely prerenal in etiology in the setting of sepsis/hypotension/dehydration.  Baseline creatinine roughly 1.4.  Continue maintenance IV fluids and monitor renal function.  Avoid nephrotoxic agents/hold home hydrochlorothiazide  and losartan .   Mild hyponatremia In the setting of poor p.o. intake.  Continue maintenance IV fluids/normal saline and monitor sodium level with morning labs   Generalized weakness PT/OT eval, fall precautions.   CLL He was maintained off of systemic therapy since he  completed a course of Bendamustine /rituximab  in November 2024 but given recent clinical and radiographic evidence of disease progression he was started on acalabrutinib  4 days ago (hold at this time in the setting of active infection).  Will add Dr. Cloretta to inpatient treatment team.   Chronic DVT/history of PE Patient is on indefinite anticoagulation with Coumadin .  Does have chronic anemia and thrombocytopenia but no overt bleeding.  Coumadin  dosing per pharmacy.   Chronic anemia and thrombocytopenia In the setting of CLL/cancer treatment.  No overt bleeding.  Monitor CBC.   Hypertension Hold antihypertensives at this time.   Hyperlipidemia Continue pravastatin .  DVT Prophylaxis: Coumadin  as above, for history of PE  Code Status: DNR  Antimicrobials: Anti-infectives (From admission, onward)    Start     Dose/Rate Route Frequency Ordered Stop   11/13/24 0800  vancomycin  (VANCOREADY) IVPB 1500 mg/300 mL        1,500 mg 150 mL/hr over 120 Minutes Intravenous Every 36 hours 11/12/24 0030     11/12/24 0800  ceFEPIme  (MAXIPIME ) 2 g in sodium chloride  0.9 % 100 mL IVPB        2 g 200 mL/hr over 30 Minutes Intravenous Every 12 hours 11/12/24 0026     11/11/24 2015  vancomycin  (VANCOREADY) IVPB 1500 mg/300 mL        1,500 mg 150 mL/hr over 120 Minutes Intravenous  Once 11/11/24 2003 11/11/24 2241   11/11/24 2000  vancomycin  (VANCOCIN ) IVPB 1000 mg/200 mL premix  Status:  Discontinued        1,000 mg 200 mL/hr over 60 Minutes Intravenous  Once 11/11/24 1959 11/11/24 2002   11/11/24 2000  ceFEPIme  (MAXIPIME ) 2 g in sodium chloride  0.9 % 100 mL IVPB  2 g 200 mL/hr over 30 Minutes Intravenous  Once 11/11/24 1959 11/11/24 2059       Objective: Vitals:   11/12/24 0300 11/12/24 0400 11/12/24 0600 11/12/24 0719  BP: (!) 91/50 (!) 88/52 (!) 93/55   Pulse: 80 73 73   Resp: 12 14 14    Temp:   98.3 F (36.8 C)   TempSrc:      SpO2: 90% (!) 87% 94% 94%  Weight:      Height:         Intake/Output Summary (Last 24 hours) at 11/12/2024 0818 Last data filed at 11/12/2024 9276 Gross per 24 hour  Intake 480.56 ml  Output --  Net 480.56 ml   Filed Weights   11/11/24 1751  Weight: 83.5 kg   Exam: General:  Alert, oriented, calm, in no acute distress, resting comfortably lying flat in the bed.  Wearing 2 L nasal cannula oxygen's.  His wife is at the bedside. Eyes: EOMI, clear sclerea Neck: supple, no masses, trachea mildline  Cardiovascular: RRR, no murmurs or rubs, no peripheral edema  Respiratory: clear to auscultation bilaterally, no wheezes, no crackles  Abdomen: soft, nontender, nondistended, normal bowel tones heard  Skin: dry, no rashes  Musculoskeletal: no joint effusions, normal range of motion  Psychiatric: appropriate affect, normal speech  Neurologic: extraocular muscles intact, clear speech, moving all extremities with intact sensorium   Data Reviewed: CBC: Recent Labs  Lab 11/07/24 1330 11/11/24 1803 11/12/24 0450  WBC 25.7* 27.2* 30.5*  NEUTROABS 2.4 4.5  --   HGB 11.6* 9.7* 8.8*  HCT 34.5* 28.6* 26.6*  MCV 94.0 95.3 96.0  PLT 81* 62* 51*   Basic Metabolic Panel: Recent Labs  Lab 11/07/24 1330 11/11/24 1803 11/12/24 0450  NA 136 134* 135  K 4.1 4.6 4.3  CL 101 104 105  CO2 24 22 21*  GLUCOSE 88 110* 97  BUN 27* 46* 46*  CREATININE 1.79* 1.93* 1.94*  CALCIUM 10.6* 9.6 9.0   GFR: Estimated Creatinine Clearance: 32.9 mL/min (A) (by C-G formula based on SCr of 1.94 mg/dL (H)). Liver Function Tests: Recent Labs  Lab 11/07/24 1330 11/11/24 1803  AST 25 26  ALT 12 12  ALKPHOS 59 40  BILITOT 0.6 0.6  PROT 8.9* 7.3  ALBUMIN 3.8 3.3*   No results for input(s): LIPASE, AMYLASE in the last 168 hours. No results for input(s): AMMONIA in the last 168 hours. Coagulation Profile: Recent Labs  Lab 11/07/24 1408 11/11/24 1803 11/12/24 0450  INR 2.4* 2.1* 2.7*   Cardiac Enzymes: No results for input(s): CKTOTAL,  CKMB, CKMBINDEX, TROPONINI in the last 168 hours. BNP (last 3 results) No results for input(s): PROBNP in the last 8760 hours. HbA1C: No results for input(s): HGBA1C in the last 72 hours. CBG: No results for input(s): GLUCAP in the last 168 hours. Lipid Profile: No results for input(s): CHOL, HDL, LDLCALC, TRIG, CHOLHDL, LDLDIRECT in the last 72 hours. Thyroid  Function Tests: No results for input(s): TSH, T4TOTAL, FREET4, T3FREE, THYROIDAB in the last 72 hours. Anemia Panel: No results for input(s): VITAMINB12, FOLATE, FERRITIN, TIBC, IRON, RETICCTPCT in the last 72 hours. Urine analysis:    Component Value Date/Time   COLORURINE YELLOW 11/11/2024 1837   APPEARANCEUR HAZY (A) 11/11/2024 1837   LABSPEC 1.014 11/11/2024 1837   PHURINE 5.0 11/11/2024 1837   GLUCOSEU NEGATIVE 11/11/2024 1837   GLUCOSEU NEGATIVE 02/20/2020 1431   HGBUR NEGATIVE 11/11/2024 1837   BILIRUBINUR NEGATIVE 11/11/2024 1837   KETONESUR NEGATIVE 11/11/2024  1837   PROTEINUR 30 (A) 11/11/2024 1837   UROBILINOGEN 0.2 02/20/2020 1431   NITRITE NEGATIVE 11/11/2024 1837   LEUKOCYTESUR NEGATIVE 11/11/2024 1837   Sepsis Labs: @LABRCNTIP (procalcitonin:4,lacticidven:4)  ) Recent Results (from the past 240 hours)  SARS Coronavirus 2 by RT PCR (hospital order, performed in [facility] hospital lab) *cepheid single result test* Anterior Nasal Swab     Status: None   Collection Time: 11/11/24  8:41 PM   Specimen: Anterior Nasal Swab  Result Value Ref Range Status   SARS Coronavirus 2 by RT PCR NEGATIVE NEGATIVE Final    Comment: (NOTE) SARS-CoV-2 target nucleic acids are NOT DETECTED.  The SARS-CoV-2 RNA is generally detectable in upper and lower respiratory specimens during the acute phase of infection. The lowest concentration of SARS-CoV-2 viral copies this assay can detect is 250 copies / mL. A negative result does not preclude SARS-CoV-2 infection and should not be  used as the sole basis for treatment or other patient management decisions.  A negative result may occur with improper specimen collection / handling, submission of specimen other than nasopharyngeal swab, presence of viral mutation(s) within the areas targeted by this assay, and inadequate number of viral copies (<250 copies / mL). A negative result must be combined with clinical observations, patient history, and epidemiological information.  Fact Sheet for Patients:   roadlaptop.co.za  Fact Sheet for Healthcare Providers: http://kim-miller.com/  This test is not yet approved or  cleared by the [place]  FDA and has been authorized for detection and/or diagnosis of SARS-CoV-2 by FDA under an Emergency Use Authorization (EUA).  This EUA will remain in effect (meaning this test can be used) for the duration of the COVID-19 declaration under Section 564(b)(1) of the Act, 21 U.S.C. section 360bbb-3(b)(1), unless the authorization is terminated or revoked sooner.  Performed at [facility], [address] [place], [place] [address]   MRSA Next Gen by PCR, Nasal     Status: None   Collection Time: 11/12/24 12:52 AM   Specimen: Nasal Mucosa; Nasal Swab  Result Value Ref Range Status   MRSA by PCR Next Gen NOT DETECTED NOT DETECTED Final    Comment: (NOTE) The GeneXpert MRSA Assay (FDA approved for NASAL specimens only), is one component of a comprehensive MRSA colonization surveillance program. It is not intended to diagnose MRSA infection nor to guide or monitor treatment for MRSA infections. Test performance is not FDA approved in patients less than 62 years old. Performed at [facility], [address], [place], [place] [address]      Studies: DG Chest 2 View Result Date: 11/11/2024 EXAM: 2 VIEW(S) XRAY OF THE CHEST 11/11/2024 07:41:00 PM COMPARISON: 02/10/2024 CLINICAL HISTORY: sepsis?  FINDINGS: LUNGS AND PLEURA: Right lower lobe airspace opacity. Left base airspace opacity. Small right pleural effusion. No pneumothorax. HEART AND MEDIASTINUM: Aortic calcification. No acute abnormality of the cardiac and mediastinal silhouettes. BONES AND SOFT TISSUES: No acute osseous abnormality. IMPRESSION: 1. Right lower lobe and left base airspace opacities, suspicious for pneumonia. 2. Small right pleural effusion. Electronically signed by: Oneil Devonshire MD 11/11/2024 07:43 PM EST RP Workstation: [id number]    Scheduled Meds:  pravastatin   40 mg Oral q1800    Continuous Infusions:  sodium chloride  75 mL/hr at 11/12/24 0723   ceFEPime  (MAXIPIME ) IV 2 g (11/12/24 0722)   [START ON 11/13/2024] vancomycin        LOS: 0 days   Time spent: 31 minutes  Arliene Rosenow CHRISTELLA Gail, MD  [facility] Pager [phone]  If 7PM-7AM, please contact night-coverage [email or web address] Password [email or web address] 11/12/2024, 8:18 AM

## 2024-11-12 NOTE — Progress Notes (Signed)
 PHARMACY - PHYSICIAN COMMUNICATION CRITICAL VALUE ALERT - BLOOD CULTURE IDENTIFICATION (BCID)  Chad Gross is an 78 y.o. male who presented to [facility] on 11/11/2024 with a chief complaint of weakness  Assessment:  2/4 bottles E coli, no resistance; source CAP  Name of physician (or Provider) Contacted: DOROTHA Kipper, via secure chat  Current antibiotics: cefepime   Changes to prescribed antibiotics recommended:  Change to ceftriaxone  2 gm IV q24 hours  Results for orders placed or performed during the hospital encounter of 11/11/24  Blood Culture ID Panel (Reflexed) (Collected: 11/11/2024  6:03 PM)  Result Value Ref Range   Enterococcus faecalis NOT DETECTED NOT DETECTED   Enterococcus Faecium NOT DETECTED NOT DETECTED   Listeria monocytogenes NOT DETECTED NOT DETECTED   Staphylococcus species NOT DETECTED NOT DETECTED   Staphylococcus aureus (BCID) NOT DETECTED NOT DETECTED   Staphylococcus epidermidis NOT DETECTED NOT DETECTED   Staphylococcus lugdunensis NOT DETECTED NOT DETECTED   Streptococcus species NOT DETECTED NOT DETECTED   Streptococcus agalactiae NOT DETECTED NOT DETECTED   Streptococcus pneumoniae NOT DETECTED NOT DETECTED   Streptococcus pyogenes NOT DETECTED NOT DETECTED   A.calcoaceticus-baumannii NOT DETECTED NOT DETECTED   Bacteroides fragilis NOT DETECTED NOT DETECTED   Enterobacterales DETECTED (A) NOT DETECTED   Enterobacter cloacae complex NOT DETECTED NOT DETECTED   Escherichia coli DETECTED (A) NOT DETECTED   Klebsiella aerogenes NOT DETECTED NOT DETECTED   Klebsiella oxytoca NOT DETECTED NOT DETECTED   Klebsiella pneumoniae NOT DETECTED NOT DETECTED   Proteus species NOT DETECTED NOT DETECTED   Salmonella species NOT DETECTED NOT DETECTED   Serratia marcescens NOT DETECTED NOT DETECTED   Haemophilus influenzae NOT DETECTED NOT DETECTED   Neisseria meningitidis NOT DETECTED NOT DETECTED   Pseudomonas aeruginosa NOT DETECTED NOT DETECTED    Stenotrophomonas maltophilia NOT DETECTED NOT DETECTED   Candida albicans NOT DETECTED NOT DETECTED   Candida auris NOT DETECTED NOT DETECTED   Candida glabrata NOT DETECTED NOT DETECTED   Candida krusei NOT DETECTED NOT DETECTED   Candida parapsilosis NOT DETECTED NOT DETECTED   Candida tropicalis NOT DETECTED NOT DETECTED   Cryptococcus neoformans/gattii NOT DETECTED NOT DETECTED   CTX-M ESBL NOT DETECTED NOT DETECTED   Carbapenem resistance IMP NOT DETECTED NOT DETECTED   Carbapenem resistance KPC NOT DETECTED NOT DETECTED   Carbapenem resistance NDM NOT DETECTED NOT DETECTED   Carbapenem resist OXA 48 LIKE NOT DETECTED NOT DETECTED   Carbapenem resistance VIM NOT DETECTED NOT DETECTED    Chad Gross, Pharm.D Use secure chat for questions 11/12/2024 7:53 PM

## 2024-11-12 NOTE — Progress Notes (Signed)
 PHARMACY - ANTICOAGULATION CONSULT NOTE  Pharmacy Consult for warfarin Indication: hx DVT/PE  Allergies[1]  Patient Measurements: Height: 5' 11 (180.3 cm) Weight: 83.5 kg (184 lb) IBW/kg (Calculated) : 75.3 HEPARIN DW (KG): 83.5  Vital Signs: Temp: 98 F (36.7 C) (12/15 0952) Temp Source: Oral (12/15 0952) BP: 105/61 (12/15 0830) Pulse Rate: 84 (12/15 1045)  Labs: Recent Labs    11/11/24 1803 11/12/24 0450  HGB 9.7* 8.8*  HCT 28.6* 26.6*  PLT 62* 51*  LABPROT 24.2* 30.4*  INR 2.1* 2.7*  CREATININE 1.93* 1.94*    Estimated Creatinine Clearance: 32.9 mL/min (A) (by C-G formula based on SCr of 1.94 mg/dL (H)).  Assessment: 78 yo male presents 12/14 with generalized weakness, currently on treatment for CLL, on warfarin for chronic DVT/PE, pharmacy to dose.  Baseline INR therapeutic Prior anticoagulation: warfarin 10 mg daily except 12.5mg  on M & Th, last dose 12/13  Significant events:  Today, 11/12/2024: CBC: Hgb low on admission and declining; Plt markedly low on admission, now decreased to nearly 50k INR remains therapeutic and increased after holding warfarin yesterday. Major drug interactions: none specific, although broad spectrum abx can increase warfarin sensitivity No bleeding issues per nursing Meal intake not charted  Goal of Therapy: INR 2-3  Plan: Reached out to Oncology Marquita) regarding his chronic thrombocytopenia, now exacerbated by likely sepsis >> recommends holding warfarin for Plt <30 or active bleeding. Hold warfarin again today - INR therapeutic but rising rapidly; concerned for overshooting if warfarin given before INR stabilized. Daily INR CBC at least q72 hr while on warfarin Monitor for signs of bleeding or thrombosis   Bard Jeans, PharmD, BCPS [phone] 11/12/2024, 11:27 AM    [1] No Known Allergies

## 2024-11-12 NOTE — ED Notes (Signed)
 Pt is already in hospital bed

## 2024-11-12 NOTE — Progress Notes (Signed)
 Pharmacy Antibiotic Note  Chad Gross is a 78 y.o. male admitted on 11/11/2024 with severe sepsis secondary to CAP.  Pharmacy has been consulted for vancomycin  dosing, 1st dose given in the ED  Plan: Vancomycin  1500mg  IV q36h (AUC 522.5, Scr 1.93) Follow renal function and clinical course  Height: 5' 11 (180.3 cm) Weight: 83.5 kg (184 lb) IBW/kg (Calculated) : 75.3  Temp (24hrs), Avg:99.9 F (37.7 C), Min:99.3 F (37.4 C), Max:100.5 F (38.1 C)  Recent Labs  Lab 11/07/24 1330 11/11/24 1803 11/11/24 1809 11/11/24 2043  WBC 25.7* 27.2*  --   --   CREATININE 1.79* 1.93*  --   --   LATICACIDVEN  --   --  1.1 1.0    Estimated Creatinine Clearance: 33.1 mL/min (A) (by C-G formula based on SCr of 1.93 mg/dL (H)).    Allergies[1]  Antimicrobials this admission: 12/14 vanc >> 12/14 cefepime  >>  Dose adjustments this admission:   Microbiology results: 12/14 BCx:  12/14 MRSA PCR:   Thank you for allowing pharmacy to be a part of this patients care. Leeroy Mace RPh 11/12/2024, 12:31 AM     [1] No Known Allergies

## 2024-11-12 NOTE — ED Notes (Signed)
 ED TO INPATIENT HANDOFF REPORT  Name/Age/Gender Chad Gross 78 y.o. male  Code Status    Code Status Orders  (From admission, onward)           Start     Ordered   11/12/24 0017  Do not attempt resuscitation (DNR) Pre-Arrest Interventions Desired  Continuous       Question Answer Comment  If pulseless and not breathing No CPR or chest compressions.   In Pre-Arrest Conditions (Patient Has Pulse and Is Breathing) May intubate, use advanced airway interventions and cardioversion/ACLS medications if appropriate or indicated. May transfer to ICU.   Consent: Discussion documented in EHR or advanced directives reviewed      11/12/24 0018           Code Status History     This patient has a current code status but no historical code status.       Home/SNF/Other Home  Chief Complaint Severe sepsis (HCC) [A41.9, R65.20]  Level of Care/Admitting Diagnosis ED Disposition     ED Disposition  Admit   Condition  --   Comment  Hospital Area: [facility] [[id number]]  Level of Care: Progressive [102]  Admit to Progressive based on following criteria: MULTISYSTEM THREATS such as stable sepsis, metabolic/electrolyte imbalance with or without encephalopathy that is responding to early treatment.  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   Clotting disorder 11/29/62   Have had three episodes of DVTs   ED (erectile dysfunction)    History of DVT (deep vein thrombosis)     Hyperlipidemia    Hypertension    Pericarditis    Pulmonary embolism (HCC)     Allergies Allergies[1]  IV Location/Drains/Wounds Patient Lines/Drains/Airways Status     Active Line/Drains/Airways     Name Placement date Placement time Site Days   Peripheral IV 11/11/24 20 G 1 Anterior;Proximal;Right Forearm 11/11/24  2027  Forearm  1            Labs/Imaging Results for orders placed or performed during the hospital encounter of 11/11/24 (from the past 48 hours)  Comprehensive metabolic panel     Status: Abnormal   Collection Time: 11/11/24  6:03 PM  Result Value Ref Range   Sodium 134 (L) 135 - 145 mmol/L   Potassium 4.6 3.5 - 5.1 mmol/L   Chloride 104 98 - 111 mmol/L   CO2 22 22 - 32 mmol/L   Glucose, Bld 110 (H) 70 - 99 mg/dL    Comment: Glucose reference range applies only to samples taken after fasting for at least 8 hours.   BUN 46 (H) 8 - 23 mg/dL   Creatinine, Ser 8.06 (H) 0.61 - 1.24 mg/dL   Calcium 9.6 8.9 - 89.6 mg/dL   Total Protein 7.3 6.5 - 8.1 g/dL   Albumin 3.3 (L) 3.5 - 5.0 g/dL   AST 26 15 - 41 U/L   ALT 12 0 - 44 U/L   Alkaline Phosphatase 40 38 - 126  U/L   Total Bilirubin 0.6 0.0 - 1.2 mg/dL   GFR, Estimated 35 (L) >60 mL/min    Comment: (NOTE) Calculated using the CKD-EPI Creatinine Equation (2021)    Anion gap 9 5 - 15    Comment: Performed at [facility], [address], [place], [place] [address]  CBC with Differential     Status: Abnormal   Collection Time: 11/11/24  6:03 PM  Result Value Ref Range   WBC 27.2 (H) 4.0 - 10.5 K/uL    Comment: Lymphocytosis with morphology consistent with known diagnosis of CLL.   RBC 3.00 (L) 4.22 - 5.81 MIL/uL   Hemoglobin 9.7 (L) 13.0 - 17.0 g/dL   HCT 71.3 (L) 60.9 - 47.9 %   MCV 95.3 80.0 - 100.0 fL   MCH 32.3 26.0 - 34.0 pg   MCHC 33.9 30.0 - 36.0 g/dL   RDW 85.3 88.4 - 84.4 %   Platelets 62 (L) 150 - 400 K/uL    Comment: PLATELET COUNT CONFIRMED BY SMEAR Immature  Platelet Fraction may be clinically indicated, consider ordering this additional test [id number]    nRBC 0.4 (H) 0.0 - 0.2 %   Neutrophils Relative % 17 %   Neutro Abs 4.5 1.7 - 7.7 K/uL   Lymphocytes Relative 79 %   Lymphs Abs 21.6 (H) 0.7 - 4.0 K/uL   Monocytes Relative 4 %   Monocytes Absolute 1.0 0.1 - 1.0 K/uL   Eosinophils Relative 0 %   Eosinophils Absolute 0.0 0.0 - 0.5 K/uL   Basophils Relative 0 %   Basophils Absolute 0.0 0.0 - 0.1 K/uL   Other SMUDGE CELLS %   Immature Granulocytes 0 %   Abs Immature Granulocytes 0.06 0.00 - 0.07 K/uL    Comment: Performed at [facility], [address], [place], [place] [address]  Protime-INR     Status: Abnormal   Collection Time: 11/11/24  6:03 PM  Result Value Ref Range   Prothrombin Time 24.2 (H) 11.4 - 15.2 seconds   INR 2.1 (H) 0.8 - 1.2    Comment: (NOTE) INR goal varies based on device and disease states. Performed at [facility], [address] [place], [place] [address]   Culture, blood (Routine x 2)     Status: None (Preliminary result)   Collection Time: 11/11/24  6:03 PM   Specimen: BLOOD  Result Value Ref Range   Specimen Description      BLOOD LEFT ANTECUBITAL Performed at [facility], [address] [place], [place] [address]    Special Requests      BOTTLES DRAWN AEROBIC AND ANAEROBIC Blood Culture adequate volume Performed at [facility], [address] [place], [place] [address]    Culture      NO GROWTH < 12 HOURS Performed at [facility], [address] [place], [place] [address]    Report Status PENDING   I-Stat Lactic Acid, ED     Status: None   Collection Time: 11/11/24  6:09 PM  Result Value Ref Range   Lactic Acid, Venous 1.1 0.5 - 1.9 mmol/L  Urinalysis, w/ Reflex to Culture (Infection Suspected) -Urine, Clean Catch     Status: Abnormal   Collection Time: 11/11/24  6:37 PM  Result Value Ref Range    Specimen Source URINE, CATHETERIZED    Color, Urine YELLOW YELLOW   APPearance HAZY (A) CLEAR   Specific Gravity, Urine 1.014 1.005 - 1.030  pH 5.0 5.0 - 8.0   Glucose, UA NEGATIVE NEGATIVE mg/dL   Hgb urine dipstick NEGATIVE NEGATIVE   Bilirubin Urine NEGATIVE NEGATIVE   Ketones, ur NEGATIVE NEGATIVE mg/dL   Protein, ur 30 (A) NEGATIVE mg/dL   Nitrite NEGATIVE NEGATIVE   Leukocytes,Ua NEGATIVE NEGATIVE   RBC / HPF 0-5 0 - 5 RBC/hpf   WBC, UA 0-5 0 - 5 WBC/hpf    Comment:        Reflex urine culture not performed if WBC <=10, OR if Squamous epithelial cells >5. If Squamous epithelial cells >5 suggest recollection.    Bacteria, UA NONE SEEN NONE SEEN   Squamous Epithelial / HPF 0-5 0 - 5 /HPF   Mucus PRESENT    Hyaline Casts, UA PRESENT     Comment: Performed at [facility], [address], [place], [place] [address]  Culture, blood (Routine x 2)     Status: None (Preliminary result)   Collection Time: 11/11/24  8:25 PM   Specimen: BLOOD  Result Value Ref Range   Specimen Description      BLOOD BLOOD RIGHT FOREARM Performed at [facility], [address], [place], [place] [address]    Special Requests      BOTTLES DRAWN AEROBIC AND ANAEROBIC Blood Culture results may not be optimal due to an inadequate volume of blood received in culture bottles Performed at [facility], [address], [place], [place] [address]    Culture      NO GROWTH < 12 HOURS Performed at [facility], [address] [place], [place] [address]    Report Status PENDING   SARS Coronavirus 2 by RT PCR (hospital order, performed in [facility] hospital lab) *cepheid single result test* Anterior Nasal Swab     Status: None   Collection Time: 11/11/24  8:41 PM   Specimen: Anterior Nasal Swab  Result Value Ref Range   SARS Coronavirus 2 by RT PCR NEGATIVE NEGATIVE    Comment: (NOTE) SARS-CoV-2 target nucleic acids are NOT  DETECTED.  The SARS-CoV-2 RNA is generally detectable in upper and lower respiratory specimens during the acute phase of infection. The lowest concentration of SARS-CoV-2 viral copies this assay can detect is 250 copies / mL. A negative result does not preclude SARS-CoV-2 infection and should not be used as the sole basis for treatment or other patient management decisions.  A negative result may occur with improper specimen collection / handling, submission of specimen other than nasopharyngeal swab, presence of viral mutation(s) within the areas targeted by this assay, and inadequate number of viral copies (<250 copies / mL). A negative result must be combined with clinical observations, patient history, and epidemiological information.  Fact Sheet for Patients:   roadlaptop.co.za  Fact Sheet for Healthcare Providers: http://kim-miller.com/  This test is not yet approved or  cleared by the [place]  FDA and has been authorized for detection and/or diagnosis of SARS-CoV-2 by FDA under an Emergency Use Authorization (EUA).  This EUA will remain in effect (meaning this test can be used) for the duration of the COVID-19 declaration under Section 564(b)(1) of the Act, 21 U.S.C. section 360bbb-3(b)(1), unless the authorization is terminated or revoked sooner.  Performed at [facility], [address] [place], [place] [address]   I-Stat Lactic Acid, ED     Status: None   Collection Time: 11/11/24  8:43 PM  Result Value Ref Range   Lactic Acid, Venous  1.0 0.5 - 1.9 mmol/L  MRSA Next Gen by PCR, Nasal     Status: None   Collection Time: 11/12/24 12:52 AM   Specimen: Nasal Mucosa; Nasal Swab  Result Value Ref Range   MRSA by PCR Next Gen NOT DETECTED NOT DETECTED    Comment: (NOTE) The GeneXpert MRSA Assay (FDA approved for NASAL specimens only), is one component of a comprehensive MRSA colonization  surveillance program. It is not intended to diagnose MRSA infection nor to guide or monitor treatment for MRSA infections. Test performance is not FDA approved in patients less than 63 years old. Performed at [facility], [address], [place], [place] [address]   Strep pneumoniae urinary antigen     Status: None   Collection Time: 11/12/24 12:52 AM  Result Value Ref Range   Strep Pneumo Urinary Antigen NEGATIVE NEGATIVE    Comment:        Infection due to S. pneumoniae cannot be absolutely ruled out since the antigen present may be below the detection limit of the test. Performed at [facility], [address], [place], [place] [address]   Protime-INR     Status: Abnormal   Collection Time: 11/12/24  4:50 AM  Result Value Ref Range   Prothrombin Time 30.4 (H) 11.4 - 15.2 seconds   INR 2.7 (H) 0.8 - 1.2    Comment: (NOTE) INR goal varies based on device and disease states. Performed at [facility], [address] [place], [place] [address]   CBC     Status: Abnormal   Collection Time: 11/12/24  4:50 AM  Result Value Ref Range   WBC 30.5 (H) 4.0 - 10.5 K/uL   RBC 2.77 (L) 4.22 - 5.81 MIL/uL   Hemoglobin 8.8 (L) 13.0 - 17.0 g/dL   HCT 73.3 (L) 60.9 - 47.9 %   MCV 96.0 80.0 - 100.0 fL   MCH 31.8 26.0 - 34.0 pg   MCHC 33.1 30.0 - 36.0 g/dL   RDW 85.1 88.4 - 84.4 %   Platelets 51 (L) 150 - 400 K/uL    Comment: CONSISTENT WITH PREVIOUS RESULT PLATELET COUNT CONFIRMED BY SMEAR Immature Platelet Fraction may be clinically indicated, consider ordering this additional test [id number]    nRBC 0.2 0.0 - 0.2 %    Comment: Performed at [facility], [address] [place], [place] [address]  Basic metabolic panel     Status: Abnormal   Collection Time: 11/12/24  4:50 AM  Result Value Ref Range   Sodium 135 135 - 145 mmol/L   Potassium 4.3 3.5 - 5.1 mmol/L   Chloride 105 98 - 111 mmol/L   CO2 21 (L) 22 - 32  mmol/L   Glucose, Bld 97 70 - 99 mg/dL    Comment: Glucose reference range applies only to samples taken after fasting for at least 8 hours.   BUN 46 (H) 8 - 23 mg/dL   Creatinine, Ser 8.05 (H) 0.61 - 1.24 mg/dL   Calcium 9.0 8.9 - 89.6 mg/dL   GFR, Estimated 35 (L) >60 mL/min    Comment: (NOTE) Calculated using the CKD-EPI Creatinine Equation (2021)    Anion gap 9 5 - 15    Comment: Performed at [facility], [address], [place], [place] [address]   *Note: Due to a large number of results and/or encounters for the requested time period, some results have not been displayed. A complete set of results  can be found in Results Review.   DG Chest 2 View Result Date: 11/11/2024 EXAM: 2 VIEW(S) XRAY OF THE CHEST 11/11/2024 07:41:00 PM COMPARISON: 02/10/2024 CLINICAL HISTORY: sepsis? FINDINGS: LUNGS AND PLEURA: Right lower lobe airspace opacity. Left base airspace opacity. Small right pleural effusion. No pneumothorax. HEART AND MEDIASTINUM: Aortic calcification. No acute abnormality of the cardiac and mediastinal silhouettes. BONES AND SOFT TISSUES: No acute osseous abnormality. IMPRESSION: 1. Right lower lobe and left base airspace opacities, suspicious for pneumonia. 2. Small right pleural effusion. Electronically signed by: Oneil Devonshire MD 11/11/2024 07:43 PM EST RP Workstation: [id number]    Pending Labs Unresulted Labs (From admission, onward)     Start     Ordered   11/13/24 0500  CBC  Tomorrow morning,   R        11/12/24 0817   11/13/24 0500  Basic metabolic panel  Tomorrow morning,   R        11/12/24 0817   11/12/24 0500  Protime-INR  Daily,   R      11/11/24 2348   11/12/24 0025  Legionella Pneumophila Serogp 1 Ur Ag  Once,   R        11/12/24 0024            Vitals/Pain Today's Vitals   11/12/24 0952 11/12/24 1015 11/12/24 1045 11/12/24 1112  BP:      Pulse:  91 84   Resp:  16 18   Temp: 98 F (36.7 C)     TempSrc: Oral     SpO2:  93% 93%    Weight:      Height:      PainSc:    2     Isolation Precautions No active isolations  Medications Medications  pravastatin  (PRAVACHOL ) tablet 40 mg (has no administration in time range)  acetaminophen  (TYLENOL ) tablet 650 mg (650 mg Oral Given 11/12/24 1114)    Or  acetaminophen  (TYLENOL ) suppository 650 mg ( Rectal See Alternative 11/12/24 1114)  0.9 %  sodium chloride  infusion ( Intravenous Rate/Dose Change 11/12/24 1325)  ceFEPIme  (MAXIPIME ) 2 g in sodium chloride  0.9 % 100 mL IVPB (0 g Intravenous Stopped 11/12/24 0752)  lactated ringers  bolus 1,000 mL (0 mLs Intravenous Stopped 11/11/24 1959)  acetaminophen  (TYLENOL ) tablet 1,000 mg (1,000 mg Oral Given 11/11/24 1829)  lactated ringers  bolus 1,000 mL (0 mLs Intravenous Stopped 11/11/24 2147)  lactated ringers  bolus 1,000 mL (0 mLs Intravenous Stopped 11/11/24 2147)  ceFEPIme  (MAXIPIME ) 2 g in sodium chloride  0.9 % 100 mL IVPB (0 g Intravenous Stopped 11/11/24 2059)  vancomycin  (VANCOREADY) IVPB 1500 mg/300 mL (0 mg Intravenous Stopped 11/11/24 2241)  lip balm (CARMEX) ointment (1 Application Topical Provided for home use 11/12/24 1325)    Mobility walks with person assist    [1] No Known Allergies

## 2024-11-12 NOTE — Consult Note (Signed)
 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   Will write for a thin layer of Desitin to intact skin of B buttocks 2 times daily and prn soiling.   POC discussed with bedside nurse. WOC team will not follow. Re-consult if further needs arise   Thank you,    Ara Grandmaison MSN, RN-BC, [employer]

## 2024-11-13 LAB — CBC
HCT: 26.9 % — ABNORMAL LOW (ref 39.0–52.0)
Hemoglobin: 9.1 g/dL — ABNORMAL LOW (ref 13.0–17.0)
MCH: 32.2 pg (ref 26.0–34.0)
MCHC: 33.8 g/dL (ref 30.0–36.0)
MCV: 95.1 fL (ref 80.0–100.0)
Platelets: 47 K/uL — ABNORMAL LOW (ref 150–400)
RBC: 2.83 MIL/uL — ABNORMAL LOW (ref 4.22–5.81)
RDW: 15 % (ref 11.5–15.5)
WBC: 25.6 K/uL — ABNORMAL HIGH (ref 4.0–10.5)
nRBC: 0 % (ref 0.0–0.2)

## 2024-11-13 LAB — BASIC METABOLIC PANEL WITH GFR
Anion gap: 9 (ref 5–15)
BUN: 40 mg/dL — ABNORMAL HIGH (ref 8–23)
CO2: 20 mmol/L — ABNORMAL LOW (ref 22–32)
Calcium: 8.6 mg/dL — ABNORMAL LOW (ref 8.9–10.3)
Chloride: 105 mmol/L (ref 98–111)
Creatinine, Ser: 1.72 mg/dL — ABNORMAL HIGH (ref 0.61–1.24)
GFR, Estimated: 40 mL/min — ABNORMAL LOW (ref >60)
Glucose, Bld: 90 mg/dL (ref 70–99)
Potassium: 4 mmol/L (ref 3.5–5.1)
Sodium: 134 mmol/L — ABNORMAL LOW (ref 135–145)

## 2024-11-13 LAB — PROTIME-INR
INR: 2.4 — ABNORMAL HIGH (ref 0.8–1.2)
Prothrombin Time: 27.2 s — ABNORMAL HIGH (ref 11.4–15.2)

## 2024-11-13 MED ORDER — CARMEX CLASSIC LIP BALM EX OINT
TOPICAL_OINTMENT | CUTANEOUS | Status: DC | PRN
  Filled 2024-11-13: qty 10

## 2024-11-13 MED ORDER — WARFARIN SODIUM 5 MG PO TABS
10.0000 mg | ORAL_TABLET | Freq: Once | ORAL | Status: AC
  Administered 2024-11-13: 16:00:00 10 mg via ORAL
  Filled 2024-11-13: qty 2

## 2024-11-13 MED ORDER — WARFARIN - PHARMACIST DOSING INPATIENT: Freq: Every day | Status: DC

## 2024-11-13 NOTE — TOC Initial Note (Addendum)
 Transition of Care [facility]) - Initial/Assessment Note    Patient Details  Name: Chad Gross MRN: [id number] Date of Birth: 20-Jun-1946  [facility]) CM/SW Contact:    Bascom Service, RN Phone Number: 11/13/2024, 3:24 PM  Clinical Narrative:  d/c plan home. No preference for HHPT/OT-await choices. Has own transport home.  -[facility] chosen for HHPT/OT rep cindie following.                Expected Discharge Plan: Home w Home Health Services Barriers to Discharge: Continued Medical Work up   Patient Goals and CMS Choice Patient states their goals for this hospitalization and ongoing recovery are:: Home CMS Medicare.gov Compare Post Acute Care list provided to:: Patient Choice offered to / list presented to : Patient [place] ownership interest in [facility].provided to:: Patient    Expected Discharge Plan and Services   Discharge Planning Services: CM Consult Post Acute Care Choice: Home Health                                        Prior Living Arrangements/Services   Lives with:: Significant Other   Do you feel safe going back to the place where you live?: Yes          Current home services: DME (rw)    Activities of Daily Living   ADL Screening (condition at time of admission) Independently performs ADLs?: Yes (appropriate for developmental age) Is the patient deaf or have difficulty hearing?: No Does the patient have difficulty seeing, even when wearing glasses/contacts?: No Does the patient have difficulty concentrating, remembering, or making decisions?: No  Permission Sought/Granted Permission sought to share information with : Case Manager Permission granted to share information with : Yes, Verbal Permission Granted              Emotional Assessment              Admission diagnosis:  Severe sepsis (HCC) [A41.9, R65.20] Multifocal pneumonia [J18.8] Patient Active Problem List   Diagnosis Date Noted    Severe sepsis (HCC) 11/12/2024   CAP (community acquired pneumonia) 11/12/2024   AKI (acute kidney injury) 11/12/2024   Hyponatremia 11/12/2024   Generalized weakness 11/12/2024   Pleural effusion 11/01/2024   Hypercalcemia 10/30/2024   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   Contact dermatitis 05/15/2024   Acute cough 02/10/2024   Impacted cerumen of left ear 10/04/2023   Viral URI 07/20/2023   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   Penile swelling 02/22/2023   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   Hematoma 03/25/2022   Penile lesion 03/25/2022   COVID-19 virus infection 09/10/2021   Bug bite 04/07/2021   Thrombocytopenia, unspecified 03/25/2021   Goals of care, counseling/discussion 10/01/2020   Thrombosed external hemorrhoid 02/20/2020   Long term current use of anticoagulant 02/07/2018   Encounter for well adult exam with abnormal findings 02/07/2018   Chronic foot pain, right 02/07/2018   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 [place] [address] Phone: [phone] Fax: [phone]  [facility] [address] [place] [address] Phone: [phone] Fax: [phone]  [facility] - [facility] [address] [place] [address] Phone: [phone] Fax:  [phone]     Social Drivers of Health (SDOH) Social History: SDOH Screenings   Food Insecurity: No Food Insecurity (11/12/2024)  Housing: Low Risk (11/12/2024)  Transportation Needs: No Transportation Needs (11/12/2024)  Utilities: Not At Risk (11/12/2024)  Alcohol Screen: Low Risk (10/28/2024)  Depression (PHQ2-9): Low Risk (11/07/2024)  Financial Resource Strain: Low Risk (10/28/2024)  Physical Activity: Sufficiently Active (10/28/2024)  Social Connections: Moderately Isolated (11/12/2024)  Stress: No Stress Concern Present (10/28/2024)  Tobacco Use: Low Risk (11/11/2024)  Health Literacy: Adequate Health Literacy (02/14/2024)   SDOH Interventions:     Readmission Risk Interventions     No data to display

## 2024-11-13 NOTE — Progress Notes (Signed)
°   11/13/24 1437  Spiritual Encounters  Type of Visit Initial  Care provided to: Patient;Friend  Referral source Nurse (RN/NT/LPN)  Reason for visit Advance directives  OnCall Visit No  Interventions  Spiritual Care Interventions Made Established relationship of care and support;Decision-making support/facilitation   Per spiritual care consult request by RN on Chad Gross's behalf, I offered support and education on completing advanced directives.  Chad Gross appreciated the information and stated he wished to update current HCPOA (which does not appear in chart). I offered some guidance on follow up steps if completing after discharge. Encouraged him to reach out to spiritual care via hospital operator if questions later about submitting electronic form.  [name] L. Delores HERO.Div

## 2024-11-13 NOTE — Evaluation (Signed)
 Occupational Therapy Evaluation Patient Details Name: Chad Gross MRN: [id number] DOB: Jul 12, 1946 Today's Date: 11/13/2024   History of Present Illness   Chad Gross is a 78 y.o. male resenting with a chief complaint of generalized weakness, fever, sepsis due to Pneumonia and right pleural effusion. 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 Wife works and dtr and neighbor provide assist as needed. Currently, patient presents with deficits outlined below (see OT Problem List for details) most significantly mild pain, decreased activity tolerance, balance and generalized muscle weakness limiting BADL's (min A LB) and functional mobility (CGA) performance. OT recommending HHOT services with caregiver support and assistance as needed upon discharge from hospital.  Patient requires continued Acute care hospital level OT services to progress safety and functional performance and allow for discharge.   VSR: pre: BP 108/68 HR 72 SpO2 100%, standing 5 minutes 128/72, HR 82 SpoO2 100% and post: 129/76 HR 76 SpO2 100%       If plan is discharge home, recommend the following:   A little help with walking and/or transfers;A little help with bathing/dressing/bathroom;Assistance with cooking/housework;Assist for transportation;Help with stairs or ramp for entrance     Functional Status Assessment   Patient has had a recent decline in their functional status and demonstrates the ability to make significant improvements in function in a reasonable and predictable amount of time.     Equipment Recommendations   None recommended by OT      Precautions/Restrictions   Precautions Precautions:  Fall Precaution/Restrictions Comments: monitor sats, BP Restrictions Weight Bearing Restrictions Per Provider Order: No     Mobility Bed Mobility Overal bed mobility: Needs Assistance Bed Mobility: Supine to Sit     Supine to sit: Min assist     General bed mobility comments: increased time and min cues for use of bed features and gait belt loop to manage LE's as needed    Transfers Overall transfer level: Needs assistance Equipment used: Rolling walker (2 wheels) Transfers: Sit to/from Stand, Bed to chair/wheelchair/BSC Sit to Stand: Min assist     Step pivot transfers: Min assist     General transfer comment: RW with cues for hand placement STS and integ with toileting and standing sinkside      Balance Overall balance assessment: Needs assistance Sitting-balance support: Feet supported, Bilateral upper extremity supported Sitting balance-Leahy Scale: Fair     Standing balance support: Reliant on assistive device for balance, During functional activity, Bilateral upper extremity supported Standing balance-Leahy Scale: Poor                             ADL either performed or assessed with clinical judgement   ADL Overall ADL's : Needs assistance/impaired Eating/Feeding: Set up;Sitting   Grooming: Wash/dry hands;Wash/dry face;Oral care;Applying deodorant;Contact guard assist;Standing Grooming Details (indicate cue type and reason): stood 5 minutes with grooming sinkside Upper Body Bathing: Set up;Sitting   Lower Body Bathing: Minimal assistance;Sitting/lateral leans   Upper Body Dressing : Contact guard assist;Sitting   Lower Body Dressing: Minimal assistance;Sit to/from stand Lower Body Dressing Details (indicate cue type and reason): min cues for adapted techniques and LE management Toilet Transfer: Contact guard assist;Rolling walker (2 wheels)   Toileting- Clothing Manipulation and Hygiene: Contact guard assist;Sit to/from  stand Toileting -  Clothing Manipulation Details (indicate cue type and reason): stood for voiding in bathroom with RW support     Functional mobility during ADLs: Contact guard assist;Rolling walker (2 wheels) General ADL Comments: continues to require rests due to decreased activity tolerance, decreased reach to LE's     Vision Baseline Vision/History: 0 No visual deficits;1 Wears glasses Vision Assessment?: No apparent visual deficits            Pertinent Vitals/Pain Pain Assessment Pain Assessment: Faces Faces Pain Scale: Hurts a little bit Pain Location: both thighs Pain Descriptors / Indicators: Aching Pain Intervention(s): Monitored during session, Repositioned     Extremity/Trunk Assessment Upper Extremity Assessment Upper Extremity Assessment: Generalized weakness;Right hand dominant   Lower Extremity Assessment Lower Extremity Assessment: Defer to PT evaluation   Cervical / Trunk Assessment Cervical / Trunk Assessment: Kyphotic   Communication Communication Communication: No apparent difficulties   Cognition Arousal: Alert Behavior During Therapy: WFL for tasks assessed/performed Cognition: No apparent impairments                               Following commands: Intact       Cueing  General Comments   Cueing Techniques: Verbal cues  VSR: pre: BP 108/68 HR 72 SpO2 100%, standing 5 minutes 128/72, HR 82 SpoO2 100% and post: 129/76 HR 76 SpO2 100%, educated on ECT's, wife Amy present for education as well           Home Living Family/patient expects to be discharged to:: Private residence Living Arrangements: Spouse/significant other Available Help at Discharge: Family Type of Home: House Home Access: Level entry     Home Layout: One level     Bathroom Shower/Tub: Producer, Television/film/video: Standard     Home Equipment: Agricultural Consultant (2 wheels);Shower seat;Adaptive equipment;Cane - single point Adaptive Equipment: Reacher Additional  Comments: has neighbor and daughter close when sig other working      Prior Functioning/Environment Prior Level of Function : Independent/Modified Independent;Driving             Mobility Comments: has not driven for a while due to High calcium, per pt ADLs Comments: usually independent.    OT Problem List: Decreased strength;Decreased activity tolerance;Impaired balance (sitting and/or standing);Cardiopulmonary status limiting activity;Pain   OT Treatment/Interventions: Self-care/ADL training;Therapeutic exercise;Neuromuscular education;Energy conservation;DME and/or AE instruction;Therapeutic activities;Patient/family education;Balance training      OT Goals(Current goals can be found in the care plan section)   Acute Rehab OT Goals Patient Stated Goal: to get stronger OT Goal Formulation: With patient/family Time For Goal Achievement: 11/27/24 Potential to Achieve Goals: Good   OT Frequency:  Min 2X/week       AM-PAC OT 6 Clicks Daily Activity     Outcome Measure Help from another person eating meals?: A Little Help from another person taking care of personal grooming?: A Little Help from another person toileting, which includes using toliet, bedpan, or urinal?: A Little Help from another person bathing (including washing, rinsing, drying)?: A Little Help from another person to put on and taking off regular upper body clothing?: A Little Help from another person to put on and taking off regular lower body clothing?: A Little 6 Click Score: 18   End of Session Equipment Utilized During Treatment: Gait belt;Rolling walker (2 wheels) Nurse Communication: Mobility status;Other (comment) (voiding data entered in Flowsheets)  Activity Tolerance: Patient tolerated treatment well Patient left:  in chair;with call bell/phone within reach;with chair alarm set;with family/visitor present  OT Visit Diagnosis: Unsteadiness on feet (R26.81);Muscle weakness (generalized)  (M62.81);Pain Pain - part of body:  (B LE's)                Time: 8946-8860 OT Time Calculation (min): 46 min Charges:  OT General Charges $OT Visit: 1 Visit OT Evaluation $OT Eval Low Complexity: 1 Low OT Treatments $Self Care/Home Management : 8-22 mins $Therapeutic Activity: 8-22 mins  Deavion Dobbs OT/L Acute Rehabilitation Department  [phone]  11/13/2024, 12:30 PM

## 2024-11-13 NOTE — Progress Notes (Addendum)
 PROGRESS NOTE  PERCELL LAMBOY FMW:[id number] DOB: 1946-05-06 DOA: 11/11/2024 PCP: de [place], Raymond J, MD  Hospital Course/Subjective: Chad Gross is a 78 y.o. 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   This AM he is feeling better, is on room air and is ambulating to the bathroom with a walker. He states he does feel dizzy and weak when he tries to walk.  Assessment/Plan:    Severe sepsis secondary to CAP Presenting with fever, tachycardia, tachypnea, hypotension, acute hypoxemia, and worsening leukocytosis on labs. Chest x-ray concerning for bibasilar pneumonia.  Continue maintenance IV fluids as he appears dehydrated.  Continue Rocephin  today.  SARS-CoV-2 PCR negative.  Currently stable on room air.  WBC improving.  Follow-up blood cultures.  MRSA PCR screen negative.  Strep pneumo negative/Legionella urinary antigens pending. Continue same for now as he is improving but still remains hypotensive. Possibly home in AM if remains stable with stable BP or room air.  Bacteremia - Ecoli from 12/14 culture Antibiotics were changed overnight to IV Rocephin  2g daily   AKI on CKD stage IIIa Likely prerenal in etiology in the setting of sepsis/hypotension/dehydration.  Baseline creatinine roughly 1.4.  Continue maintenance IV fluids and monitor renal function.  Avoid nephrotoxic agents/hold home hydrochlorothiazide  and losartan . Renal function improving but not yet back to baseline.   Mild hyponatremia In the setting of poor p.o. intake.  Continue maintenance IV fluids/normal saline and monitor sodium level with morning labs, is improving.    Generalized weakness PT/OT eval, fall precautions.   CLL He was maintained off of systemic therapy since he completed a course of Bendamustine /rituximab  in November 2024 but given recent clinical and radiographic evidence of disease progression he was started on acalabrutinib  4 days ago.  Dr. Cloretta has seen him in house and per pt recommends continuing his acalabrutinib .   Chronic DVT/history of PE Patient is on indefinite anticoagulation with Coumadin .  Does have chronic anemia and thrombocytopenia but no overt bleeding.  Coumadin  dosing per pharmacy.   Chronic anemia and thrombocytopenia In the setting of CLL/cancer treatment.  No overt bleeding.  Monitor CBC.   Hypertension Hold antihypertensives at this time.   Hyperlipidemia Continue pravastatin .  DVT Prophylaxis: Coumadin  as above, for history of PE  Code Status: DNR  Antimicrobials: Anti-infectives (From admission, onward)    Start     Dose/Rate Route Frequency Ordered Stop   11/13/24 0800  vancomycin  (VANCOREADY) IVPB 1500 mg/300 mL  Status:  Discontinued        1,500 mg 150 mL/hr over 120 Minutes Intravenous Every 36 hours 11/12/24 0030 11/12/24 1052   11/12/24 2000  cefTRIAXone  (ROCEPHIN ) 2 g in sodium chloride  0.9 % 100 mL IVPB        2 g 200 mL/hr over 30 Minutes Intravenous Every 24 hours 11/12/24 1950     11/12/24 0800  ceFEPIme  (MAXIPIME ) 2 g in sodium chloride  0.9 % 100 mL IVPB  Status:  Discontinued        2 g 200 mL/hr over 30 Minutes Intravenous Every 12 hours 11/12/24 0026 11/12/24 1950   11/11/24 2015  vancomycin  (VANCOREADY) IVPB 1500 mg/300 mL        1,500 mg 150 mL/hr over 120 Minutes Intravenous  Once 11/11/24 2003 11/11/24 2241  11/11/24 2000  vancomycin  (VANCOCIN ) IVPB 1000 mg/200 mL premix  Status:  Discontinued        1,000 mg 200 mL/hr over 60 Minutes Intravenous  Once 11/11/24 1959 11/11/24 2002   11/11/24 2000  ceFEPIme  (MAXIPIME ) 2 g in sodium chloride  0.9 % 100 mL IVPB        2 g 200  mL/hr over 30 Minutes Intravenous  Once 11/11/24 1959 11/11/24 2059       Objective: Vitals:   11/12/24 1500 11/12/24 2131 11/13/24 0137 11/13/24 0603  BP: (!) 91/51 107/64 108/61 (!) 107/59  Pulse: 74 80 76 75  Resp:  16 18 16   Temp: (!) 97.4 F (36.3 C) 98.5 F (36.9 C) 98.3 F (36.8 C) 98.4 F (36.9 C)  TempSrc: Oral Oral Oral Oral  SpO2: 93% 95% 92% 93%  Weight:      Height:        Intake/Output Summary (Last 24 hours) at 11/13/2024 1102 Last data filed at 11/13/2024 1015 Gross per 24 hour  Intake 1296.22 ml  Output --  Net 1296.22 ml   Filed Weights   11/11/24 1751  Weight: 83.5 kg   Exam: General:  Alert, oriented, calm, in no acute distress, resting comfortably sitting up in the bed.  Now on room air and looks more energetic. His wife is at the bedside. Eyes: EOMI, clear sclerea Neck: supple, no masses, trachea mildline  Cardiovascular: RRR, no murmurs or rubs, no peripheral edema  Respiratory: clear to auscultation bilaterally, no wheezes, no crackles  Abdomen: soft, nontender, nondistended, normal bowel tones heard  Skin: dry, no rashes  Musculoskeletal: no joint effusions, normal range of motion  Psychiatric: appropriate affect, normal speech  Neurologic: extraocular muscles intact, clear speech, moving all extremities with intact sensorium   Data Reviewed: CBC: Recent Labs  Lab 11/07/24 1330 11/11/24 1803 11/12/24 0450 11/13/24 0520  WBC 25.7* 27.2* 30.5* 25.6*  NEUTROABS 2.4 4.5  --   --   HGB 11.6* 9.7* 8.8* 9.1*  HCT 34.5* 28.6* 26.6* 26.9*  MCV 94.0 95.3 96.0 95.1  PLT 81* 62* 51* 47*   Basic Metabolic Panel: Recent Labs  Lab 11/07/24 1330 11/11/24 1803 11/12/24 0450 11/13/24 0520  NA 136 134* 135 134*  K 4.1 4.6 4.3 4.0  CL 101 104 105 105  CO2 24 22 21* 20*  GLUCOSE 88 110* 97 90  BUN 27* 46* 46* 40*  CREATININE 1.79* 1.93* 1.94* 1.72*  CALCIUM 10.6* 9.6 9.0 8.6*   GFR: Estimated Creatinine Clearance: 37.1 mL/min (A) (by  C-G formula based on SCr of 1.72 mg/dL (H)). Liver Function Tests: Recent Labs  Lab 11/07/24 1330 11/11/24 1803  AST 25 26  ALT 12 12  ALKPHOS 59 40  BILITOT 0.6 0.6  PROT 8.9* 7.3  ALBUMIN 3.8 3.3*   No results for input(s): LIPASE, AMYLASE in the last 168 hours. No results for input(s): AMMONIA in the last 168 hours. Coagulation Profile: Recent Labs  Lab 11/07/24 1408 11/11/24 1803 11/12/24 0450 11/13/24 0520  INR 2.4* 2.1* 2.7* 2.4*   Cardiac Enzymes: No results for input(s): CKTOTAL, CKMB, CKMBINDEX, TROPONINI in the last 168 hours. BNP (last 3 results) No results for input(s): PROBNP in the last 8760 hours. HbA1C: No results for input(s): HGBA1C in the last 72 hours. CBG: No results for input(s): GLUCAP in the last 168 hours. Lipid Profile: No results for input(s): CHOL, HDL, LDLCALC, TRIG, CHOLHDL, LDLDIRECT in the last 72 hours. Thyroid  Function Tests: No results  for input(s): TSH, T4TOTAL, FREET4, T3FREE, THYROIDAB in the last 72 hours. Anemia Panel: No results for input(s): VITAMINB12, FOLATE, FERRITIN, TIBC, IRON, RETICCTPCT in the last 72 hours. Urine analysis:    Component Value Date/Time   COLORURINE YELLOW 11/11/2024 1837   APPEARANCEUR HAZY (A) 11/11/2024 1837   LABSPEC 1.014 11/11/2024 1837   PHURINE 5.0 11/11/2024 1837   GLUCOSEU NEGATIVE 11/11/2024 1837   GLUCOSEU NEGATIVE 02/20/2020 1431   HGBUR NEGATIVE 11/11/2024 1837   BILIRUBINUR NEGATIVE 11/11/2024 1837   KETONESUR NEGATIVE 11/11/2024 1837   PROTEINUR 30 (A) 11/11/2024 1837   UROBILINOGEN 0.2 02/20/2020 1431   NITRITE NEGATIVE 11/11/2024 1837   LEUKOCYTESUR NEGATIVE 11/11/2024 1837   Sepsis Labs: @LABRCNTIP (procalcitonin:4,lacticidven:4)  ) Recent Results (from the past 240 hours)  Culture, blood (Routine x 2)     Status: None (Preliminary result)   Collection Time: 11/11/24  6:03 PM   Specimen: BLOOD  Result Value Ref Range  Status   Specimen Description   Final    BLOOD LEFT ANTECUBITAL Performed at [facility], [address], [place], [place] [address]    Special Requests   Final    BOTTLES DRAWN AEROBIC AND ANAEROBIC Blood Culture adequate volume Performed at [facility], [address] [place], [place] [address]    Culture  Setup Time   Final    GRAM NEGATIVE RODS IN BOTH AEROBIC AND ANAEROBIC BOTTLES CRITICAL RESULT CALLED TO, READ BACK BY AND VERIFIED WITH: PHARMD MICHELLE BELL ON 11/12/24 @ 1943 BY DRT Performed at [facility], [address] [place], [place] [address]    Culture GRAM NEGATIVE RODS  Final   Report Status PENDING  Incomplete  Blood Culture ID Panel (Reflexed)     Status: Abnormal   Collection Time: 11/11/24  6:03 PM  Result Value Ref Range Status   Enterococcus faecalis NOT DETECTED NOT DETECTED Final   Enterococcus Faecium NOT DETECTED NOT DETECTED Final   Listeria monocytogenes NOT DETECTED NOT DETECTED Final   Staphylococcus species NOT DETECTED NOT DETECTED Final   Staphylococcus aureus (BCID) NOT DETECTED NOT DETECTED Final   Staphylococcus epidermidis NOT DETECTED NOT DETECTED Final   Staphylococcus lugdunensis NOT DETECTED NOT DETECTED Final   Streptococcus species NOT DETECTED NOT DETECTED Final   Streptococcus agalactiae NOT DETECTED NOT DETECTED Final   Streptococcus pneumoniae NOT DETECTED NOT DETECTED Final   Streptococcus pyogenes NOT DETECTED NOT DETECTED Final   A.calcoaceticus-baumannii NOT DETECTED NOT DETECTED Final   Bacteroides fragilis NOT DETECTED NOT DETECTED Final   Enterobacterales DETECTED (A) NOT DETECTED Final    Comment: Enterobacterales represent a large order of gram negative bacteria, not a single organism. CRITICAL RESULT CALLED TO, READ BACK BY AND VERIFIED WITH: PHARMD MICHELLE BELL ON 11/12/24 @ 1943 BY DRT    Enterobacter cloacae complex NOT DETECTED NOT DETECTED Final   Escherichia coli  DETECTED (A) NOT DETECTED Final    Comment: CRITICAL RESULT CALLED TO, READ BACK BY AND VERIFIED WITH: PHARMD MICHELLE BELL ON 11/12/24 @ 1943 BY DRT    Klebsiella aerogenes NOT DETECTED NOT DETECTED Final   Klebsiella oxytoca NOT DETECTED NOT DETECTED Final   Klebsiella pneumoniae NOT DETECTED NOT DETECTED Final   Proteus species NOT DETECTED NOT DETECTED Final   Salmonella species NOT DETECTED NOT DETECTED Final   Serratia marcescens NOT DETECTED NOT DETECTED Final   Haemophilus influenzae NOT DETECTED NOT DETECTED Final   Neisseria meningitidis NOT DETECTED NOT DETECTED Final   Pseudomonas  aeruginosa NOT DETECTED NOT DETECTED Final   Stenotrophomonas maltophilia NOT DETECTED NOT DETECTED Final   Candida albicans NOT DETECTED NOT DETECTED Final   Candida auris NOT DETECTED NOT DETECTED Final   Candida glabrata NOT DETECTED NOT DETECTED Final   Candida krusei NOT DETECTED NOT DETECTED Final   Candida parapsilosis NOT DETECTED NOT DETECTED Final   Candida tropicalis NOT DETECTED NOT DETECTED Final   Cryptococcus neoformans/gattii NOT DETECTED NOT DETECTED Final   CTX-M ESBL NOT DETECTED NOT DETECTED Final   Carbapenem resistance IMP NOT DETECTED NOT DETECTED Final   Carbapenem resistance KPC NOT DETECTED NOT DETECTED Final   Carbapenem resistance NDM NOT DETECTED NOT DETECTED Final   Carbapenem resist OXA 48 LIKE NOT DETECTED NOT DETECTED Final   Carbapenem resistance VIM NOT DETECTED NOT DETECTED Final    Comment: Performed at [facility], [address], [place], [place] [address]  Culture, blood (Routine x 2)     Status: None (Preliminary result)   Collection Time: 11/11/24  8:25 PM   Specimen: BLOOD  Result Value Ref Range Status   Specimen Description   Final    BLOOD BLOOD RIGHT FOREARM Performed at [facility], [address], [place], [place] [address]    Special Requests   Final    BOTTLES DRAWN AEROBIC AND ANAEROBIC Blood Culture results may  not be optimal due to an inadequate volume of blood received in culture bottles Performed at [facility], [address], [place], [place] [address]    Culture   Final    NO GROWTH < 12 HOURS Performed at [facility], [address] [place], [place] [address]    Report Status PENDING  Incomplete  SARS Coronavirus 2 by RT PCR (hospital order, performed in [facility] hospital lab) *cepheid single result test* Anterior Nasal Swab     Status: None   Collection Time: 11/11/24  8:41 PM   Specimen: Anterior Nasal Swab  Result Value Ref Range Status   SARS Coronavirus 2 by RT PCR NEGATIVE NEGATIVE Final    Comment: (NOTE) SARS-CoV-2 target nucleic acids are NOT DETECTED.  The SARS-CoV-2 RNA is generally detectable in upper and lower respiratory specimens during the acute phase of infection. The lowest concentration of SARS-CoV-2 viral copies this assay can detect is 250 copies / mL. A negative result does not preclude SARS-CoV-2 infection and should not be used as the sole basis for treatment or other patient management decisions.  A negative result may occur with improper specimen collection / handling, submission of specimen other than nasopharyngeal swab, presence of viral mutation(s) within the areas targeted by this assay, and inadequate number of viral copies (<250 copies / mL). A negative result must be combined with clinical observations, patient history, and epidemiological information.  Fact Sheet for Patients:   roadlaptop.co.za  Fact Sheet for Healthcare Providers: http://kim-miller.com/  This test is not yet approved or  cleared by the [place]  FDA and has been authorized for detection and/or diagnosis of SARS-CoV-2 by FDA under an Emergency Use Authorization (EUA).  This EUA will remain in effect (meaning this test can be used) for the duration of the COVID-19 declaration under Section 564(b)(1)  of the Act, 21 U.S.C. section 360bbb-3(b)(1), unless the authorization is terminated or revoked sooner.  Performed at [facility], [address], [place], [place] [address]   MRSA Next Gen by PCR, Nasal     Status: None  Collection Time: 11/12/24 12:52 AM   Specimen: Nasal Mucosa; Nasal Swab  Result Value Ref Range Status   MRSA by PCR Next Gen NOT DETECTED NOT DETECTED Final    Comment: (NOTE) The GeneXpert MRSA Assay (FDA approved for NASAL specimens only), is one component of a comprehensive MRSA colonization surveillance program. It is not intended to diagnose MRSA infection nor to guide or monitor treatment for MRSA infections. Test performance is not FDA approved in patients less than 59 years old. Performed at [facility], [address] [place], [place] [address]      Studies: No results found.   Scheduled Meds:  feeding supplement  237 mL Oral BID BM   liver oil-zinc  oxide   Topical BID   pravastatin   40 mg Oral q1800    Continuous Infusions:  cefTRIAXone  (ROCEPHIN )  IV 2 g (11/12/24 2216)     LOS: 1 day   Time spent: 31 minutes  Labaron Digirolamo CHRISTELLA Gail, MD [facility] Pager [phone]  If 7PM-7AM, please contact night-coverage [email or web address] Password [email or web address] 11/13/2024, 11:02 AM

## 2024-11-13 NOTE — Plan of Care (Signed)

## 2024-11-13 NOTE — Progress Notes (Signed)
 PHARMACY - ANTICOAGULATION CONSULT NOTE  Pharmacy Consult for warfarin Indication:  hx recurrent DVT   Allergies[1]  Patient Measurements: Height: 5' 11 (180.3 cm) Weight: 83.5 kg (184 lb) IBW/kg (Calculated) : 75.3 HEPARIN DW (KG): 83.5  Vital Signs: Temp: 98.4 F (36.9 C) (12/16 0603) Temp Source: Oral (12/16 0603) BP: 107/59 (12/16 0603) Pulse Rate: 75 (12/16 0603)  Labs: Recent Labs    11/11/24 1803 11/12/24 0450 11/13/24 0520  HGB 9.7* 8.8* 9.1*  HCT 28.6* 26.6* 26.9*  PLT 62* 51* 47*  LABPROT 24.2* 30.4* 27.2*  INR 2.1* 2.7* 2.4*  CREATININE 1.93* 1.94* 1.72*    Estimated Creatinine Clearance: 37.1 mL/min (A) (by C-G formula based on SCr of 1.72 mg/dL (H)).   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 Pharmacy consulted on 11/11/24 to dose warfarin for patient.   Significant events:  - 12/15: Per Dr. Cloretta via [facility] msg, he recom. holding warfarin for Plt <30 or active bleeding.    Today, 11/13/2024: - INR is therapeutic at 2.4 - hgb somewhat stable, plts trending down with 47K this mornig - no bleeding documented  - drug-drug intxns: being on abx can take pt more sensitive to warfarin (on ceftriaxone )  Goal of Therapy:  INR 2-3 Monitor platelets by anticoagulation  protocol: Yes   Plan:  - warfarin 10 mg PO x1 today - daily INR - monitor plts closely and for s/sx bleeding  Filicia Scogin P 11/13/2024,11:05 AM      [1] No Known Allergies

## 2024-11-14 DIAGNOSIS — J189 Pneumonia, unspecified organism: Secondary | ICD-10-CM | POA: Diagnosis not present

## 2024-11-14 LAB — EXPECTORATED SPUTUM ASSESSMENT W GRAM STAIN, RFLX TO RESP C

## 2024-11-14 LAB — CULTURE, BLOOD (ROUTINE X 2): Special Requests: ADEQUATE

## 2024-11-14 LAB — CBC
HCT: 27.3 % — ABNORMAL LOW (ref 39.0–52.0)
Hemoglobin: 9.2 g/dL — ABNORMAL LOW (ref 13.0–17.0)
MCH: 32.1 pg (ref 26.0–34.0)
MCHC: 33.7 g/dL (ref 30.0–36.0)
MCV: 95.1 fL (ref 80.0–100.0)
Platelets: 58 K/uL — ABNORMAL LOW (ref 150–400)
RBC: 2.87 MIL/uL — ABNORMAL LOW (ref 4.22–5.81)
RDW: 15.1 % (ref 11.5–15.5)
WBC: 30.8 K/uL — ABNORMAL HIGH (ref 4.0–10.5)
nRBC: 0 % (ref 0.0–0.2)

## 2024-11-14 LAB — LEGIONELLA PNEUMOPHILA SEROGP 1 UR AG: L. pneumophila Serogp 1 Ur Ag: NEGATIVE

## 2024-11-14 LAB — PROTIME-INR
INR: 1.5 — ABNORMAL HIGH (ref 0.8–1.2)
Prothrombin Time: 19 s — ABNORMAL HIGH (ref 11.4–15.2)

## 2024-11-14 MED ORDER — AMOXICILLIN-POT CLAVULANATE 875-125 MG PO TABS
1.0000 | ORAL_TABLET | Freq: Two times a day (BID) | ORAL | Status: DC
  Administered 2024-11-14 – 2024-11-15 (×2): 1 via ORAL
  Filled 2024-11-14 (×2): qty 1

## 2024-11-14 MED ORDER — WARFARIN SODIUM 5 MG PO TABS
15.0000 mg | ORAL_TABLET | Freq: Once | ORAL | Status: AC
  Administered 2024-11-14: 16:00:00 15 mg via ORAL
  Filled 2024-11-14: qty 3

## 2024-11-14 NOTE — Progress Notes (Signed)
 PT Cancellation Note  Patient Details Name: Chad Gross MRN: [id number] DOB: 02/19/1946   Cancelled Treatment:    Reason Eval/Treat Not Completed: Other (comment). Pt with NT in room checking vitals, reports about to go to restroom with NT then pt's lunch tray arrived. Will check back as schedule permits.    Metta Ave PT, DPT 11/14/2024, 2:48 PM

## 2024-11-14 NOTE — Progress Notes (Addendum)
 PROGRESS NOTE    Chad Gross  FMW:[id number] DOB: August 11, 1946 DOA: 11/11/2024 PCP: de [place], Raymond J, MD   Brief Narrative:    78 y.o. 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   Assessment & Plan:  Principal Problem:   CAP (community acquired pneumonia) Active Problems:   Severe sepsis (HCC)   AKI (acute kidney injury)   Hyponatremia   Generalized weakness   Severe sepsis secondary to possible bacterial CAP,POA: Continue Rocephin  IV 2 g Q24H.  SARS-CoV-2 PCR negative.  Currently stable on room air.   Follow-up blood cultures-E.Coli.   MRSA PCR screen negative.   F/u sputum cultures Strep pneumo negative/Legionella urinary antigens pending.    E.coli Bacteremia - Ecoli from 12/14 culture Continue with IV Rocephin  2g daily   AKI on CKD stage IIIa Likely prerenal in etiology in the setting of sepsis/hypotension/dehydration.  Baseline creatinine roughly 1.4.  Continue maintenance IV fluids and monitor renal function.   Avoid nephrotoxic agents/hold home hydrochlorothiazide  and losartan .  Cr today is 1.72   Mild hyponatremia In the setting of poor p.o. intake.  Continue maintenance IV fluids/normal saline and monitor sodium level with morning labs, is improving.   Generalized weakness/Physical deconditioning,POA: PT/OT eval, fall precautions.   CLL He was maintained off of systemic therapy since he completed a course of Bendamustine /rituximab  in November 2024 but given recent clinical and radiographic evidence of disease progression he was started on acalabrutinib  4 days ago.   Dr. Cloretta has seen him in house  and per pt recommends continuing his acalabrutinib .   Chronic DVT/history of PE Patient is on indefinite anticoagulation with Coumadin .  Does have chronic anemia and thrombocytopenia but no overt bleeding.  Coumadin  dosing per pharmacy.   Chronic anemia and thrombocytopenia In the setting of CLL/cancer treatment.  No overt bleeding.  Monitor CBC.   Hypertension Hold antihypertensives at this time.   Hyperlipidemia Continue pravastatin .  Disposition: Home   DVT prophylaxis: on coaumadin     Code Status: Do not attempt resuscitation (DNR) PRE-ARREST INTERVENTIONS DESIRED Family Communication:  Significant other at the bedside Status is: Inpatient Remains inpatient appropriate because: Pneumonia,bacteremia    Subjective:  Shortness of breath has improved. Denies chest pain or cough. Feels tired. His girl friend is at the bedside. He told me about his h/o blood clots and IVC filter placement.   Examination:  General exam: Appears calm and comfortable  Respiratory system: Clear to auscultation. Respiratory effort normal. Cardiovascular system: S1 & S2 heard, RRR. No JVD, murmurs, rubs, gallops or clicks. No pedal edema. Gastrointestinal system: Abdomen is nondistended, soft and nontender. No organomegaly or masses felt. Normal bowel sounds heard. Central nervous system: Alert and oriented. No focal neurological deficits. Extremities: Symmetric 5 x 5 power. Skin: No rashes, lesions or ulcers Psychiatry: Judgement and insight appear normal. Mood & affect appropriate.  Wound 11/12/24 1652 Pressure Injury Coccyx Medial Stage 3 -  Full thickness tissue loss. Subcutaneous fat may be visible but bone, tendon or muscle are NOT exposed. (Active)     Diet Orders (From admission, onward)     Start     Ordered   11/12/24 0819  Diet regular Room service appropriate? Yes; Fluid consistency: Thin  Diet effective now  Question Answer Comment  Room service appropriate? Yes   Fluid  consistency: Thin      11/12/24 0818            Objective: Vitals:   11/13/24 0603 11/13/24 1402 11/13/24 2031 11/14/24 0514  BP: (!) 107/59 (!) 106/58 107/62 116/69  Pulse: 75 80 83 73  Resp: 16 18 17 15   Temp: 98.4 F (36.9 C) 98.2 F (36.8 C) 98.8 F (37.1 C) 97.7 F (36.5 C)  TempSrc: Oral Oral    SpO2: 93% 94% 94% 91%  Weight:      Height:        Intake/Output Summary (Last 24 hours) at 11/14/2024 1023 Last data filed at 11/13/2024 1403 Gross per 24 hour  Intake --  Output 200 ml  Net -200 ml   Filed Weights   11/11/24 1751  Weight: 83.5 kg    Scheduled Meds:  feeding supplement  237 mL Oral BID BM   liver oil-zinc  oxide   Topical BID   pravastatin   40 mg Oral q1800   Warfarin - Pharmacist Dosing Inpatient   Does not apply q1600   Continuous Infusions:  cefTRIAXone  (ROCEPHIN )  IV 2 g (11/13/24 2153)    Nutritional status     Body mass index is 25.66 kg/m.  Data Reviewed:   CBC: Recent Labs  Lab 11/07/24 1330 11/11/24 1803 11/12/24 0450 11/13/24 0520 11/14/24 0551  WBC 25.7* 27.2* 30.5* 25.6* 30.8*  NEUTROABS 2.4 4.5  --   --   --   HGB 11.6* 9.7* 8.8* 9.1* 9.2*  HCT 34.5* 28.6* 26.6* 26.9* 27.3*  MCV 94.0 95.3 96.0 95.1 95.1  PLT 81* 62* 51* 47* 58*   Basic Metabolic Panel: Recent Labs  Lab 11/07/24 1330 11/11/24 1803 11/12/24 0450 11/13/24 0520  NA 136 134* 135 134*  K 4.1 4.6 4.3 4.0  CL 101 104 105 105  CO2 24 22 21* 20*  GLUCOSE 88 110* 97 90  BUN 27* 46* 46* 40*  CREATININE 1.79* 1.93* 1.94* 1.72*  CALCIUM 10.6* 9.6 9.0 8.6*   GFR: Estimated Creatinine Clearance: 37.1 mL/min (A) (by C-G formula based on SCr of 1.72 mg/dL (H)). Liver Function Tests: Recent Labs  Lab 11/07/24 1330 11/11/24 1803  AST 25 26  ALT 12 12  ALKPHOS 59 40  BILITOT 0.6 0.6  PROT 8.9* 7.3  ALBUMIN 3.8 3.3*   No results for input(s): LIPASE, AMYLASE in the last 168 hours. No results for input(s): AMMONIA in the last 168  hours. Coagulation Profile: Recent Labs  Lab 11/07/24 1408 11/11/24 1803 11/12/24 0450 11/13/24 0520 11/14/24 0551  INR 2.4* 2.1* 2.7* 2.4* 1.5*   Cardiac Enzymes: No results for input(s): CKTOTAL, CKMB, CKMBINDEX, TROPONINI in the last 168 hours. BNP (last 3 results) No results for input(s): PROBNP in the last 8760 hours. HbA1C: No results for input(s): HGBA1C in the last 72 hours. CBG: No results for input(s): GLUCAP in the last 168 hours. Lipid Profile: No results for input(s): CHOL, HDL, LDLCALC, TRIG, CHOLHDL, LDLDIRECT in the last 72 hours. Thyroid  Function Tests: No results for input(s): TSH, T4TOTAL, FREET4, T3FREE, THYROIDAB in the last 72 hours. Anemia Panel: No results for input(s): VITAMINB12, FOLATE, FERRITIN, TIBC, IRON, RETICCTPCT in the last 72 hours. Sepsis Labs: Recent Labs  Lab 11/11/24 1809 11/11/24 2043  LATICACIDVEN 1.1 1.0    Recent Results (from the past 240 hours)  Culture, blood (Routine x 2)     Status: Abnormal   Collection Time:  11/11/24  6:03 PM   Specimen: BLOOD  Result Value Ref Range Status   Specimen Description   Final    BLOOD LEFT ANTECUBITAL Performed at [facility], [address], [place], [place] [address]    Special Requests   Final    BOTTLES DRAWN AEROBIC AND ANAEROBIC Blood Culture adequate volume Performed at [facility], [address], [place], [place] [address]    Culture  Setup Time   Final    GRAM NEGATIVE RODS IN BOTH AEROBIC AND ANAEROBIC BOTTLES CRITICAL RESULT CALLED TO, READ BACK BY AND VERIFIED WITH: PHARMD MICHELLE BELL ON 11/12/24 @ 1943 BY DRT Performed at [facility], [address], [place], [place] [address]    Culture ESCHERICHIA COLI (A)  Final   Report Status 11/14/2024 FINAL  Final   Organism ID, Bacteria ESCHERICHIA COLI  Final      Susceptibility   Escherichia coli - MIC*    AMPICILLIN <=2 SENSITIVE  Sensitive     CEFAZOLIN  (NON-URINE) <=1 SENSITIVE Sensitive     CEFEPIME  <=0.12 SENSITIVE Sensitive     ERTAPENEM <=0.12 SENSITIVE Sensitive     CEFTRIAXONE  <=0.25 SENSITIVE Sensitive     CIPROFLOXACIN >=4 RESISTANT Resistant     GENTAMICIN <=1 SENSITIVE Sensitive     MEROPENEM <=0.25 SENSITIVE Sensitive     TRIMETH/SULFA >=320 RESISTANT Resistant     AMPICILLIN/SULBACTAM <=2 SENSITIVE Sensitive     PIP/TAZO Value in next row Sensitive      <=4 SENSITIVEThis is a modified FDA-approved test that has been validated and its performance characteristics determined by the reporting laboratory.  This laboratory is certified under the Clinical Laboratory Improvement Amendments CLIA as qualified to perform high complexity clinical laboratory testing.    * ESCHERICHIA COLI  Blood Culture ID Panel (Reflexed)     Status: Abnormal   Collection Time: 11/11/24  6:03 PM  Result Value Ref Range Status   Enterococcus faecalis NOT DETECTED NOT DETECTED Final   Enterococcus Faecium NOT DETECTED NOT DETECTED Final   Listeria monocytogenes NOT DETECTED NOT DETECTED Final   Staphylococcus species NOT DETECTED NOT DETECTED Final   Staphylococcus aureus (BCID) NOT DETECTED NOT DETECTED Final   Staphylococcus epidermidis NOT DETECTED NOT DETECTED Final   Staphylococcus lugdunensis NOT DETECTED NOT DETECTED Final   Streptococcus species NOT DETECTED NOT DETECTED Final   Streptococcus agalactiae NOT DETECTED NOT DETECTED Final   Streptococcus pneumoniae NOT DETECTED NOT DETECTED Final   Streptococcus pyogenes NOT DETECTED NOT DETECTED Final   A.calcoaceticus-baumannii NOT DETECTED NOT DETECTED Final   Bacteroides fragilis NOT DETECTED NOT DETECTED Final   Enterobacterales DETECTED (A) NOT DETECTED Final    Comment: Enterobacterales represent a large order of gram negative bacteria, not a single organism. CRITICAL RESULT CALLED TO, READ BACK BY AND VERIFIED WITH: PHARMD MICHELLE BELL ON 11/12/24 @ 1943 BY DRT     Enterobacter cloacae complex NOT DETECTED NOT DETECTED Final   Escherichia coli DETECTED (A) NOT DETECTED Final    Comment: CRITICAL RESULT CALLED TO, READ BACK BY AND VERIFIED WITH: PHARMD MICHELLE BELL ON 11/12/24 @ 1943 BY DRT    Klebsiella aerogenes NOT DETECTED NOT DETECTED Final   Klebsiella oxytoca NOT DETECTED NOT DETECTED Final   Klebsiella pneumoniae NOT DETECTED NOT DETECTED Final   Proteus species NOT DETECTED NOT DETECTED Final   Salmonella species NOT DETECTED NOT DETECTED Final   Serratia marcescens NOT DETECTED NOT DETECTED Final   Haemophilus influenzae NOT DETECTED  NOT DETECTED Final   Neisseria meningitidis NOT DETECTED NOT DETECTED Final   Pseudomonas aeruginosa NOT DETECTED NOT DETECTED Final   Stenotrophomonas maltophilia NOT DETECTED NOT DETECTED Final   Candida albicans NOT DETECTED NOT DETECTED Final   Candida auris NOT DETECTED NOT DETECTED Final   Candida glabrata NOT DETECTED NOT DETECTED Final   Candida krusei NOT DETECTED NOT DETECTED Final   Candida parapsilosis NOT DETECTED NOT DETECTED Final   Candida tropicalis NOT DETECTED NOT DETECTED Final   Cryptococcus neoformans/gattii NOT DETECTED NOT DETECTED Final   CTX-M ESBL NOT DETECTED NOT DETECTED Final   Carbapenem resistance IMP NOT DETECTED NOT DETECTED Final   Carbapenem resistance KPC NOT DETECTED NOT DETECTED Final   Carbapenem resistance NDM NOT DETECTED NOT DETECTED Final   Carbapenem resist OXA 48 LIKE NOT DETECTED NOT DETECTED Final   Carbapenem resistance VIM NOT DETECTED NOT DETECTED Final    Comment: Performed at [facility], [address], [place], [place] [address]  Culture, blood (Routine x 2)     Status: None (Preliminary result)   Collection Time: 11/11/24  8:25 PM   Specimen: BLOOD  Result Value Ref Range Status   Specimen Description   Final    BLOOD BLOOD RIGHT FOREARM Performed at [facility], [address] [place], [place] [address]    Special  Requests   Final    BOTTLES DRAWN AEROBIC AND ANAEROBIC Blood Culture results may not be optimal due to an inadequate volume of blood received in culture bottles Performed at [facility], [address] [place], [place] [address]    Culture   Final    NO GROWTH 3 DAYS Performed at [facility], [address] [place], [place] [address]    Report Status PENDING  Incomplete  SARS Coronavirus 2 by RT PCR (hospital order, performed in [facility] hospital lab) *cepheid single result test* Anterior Nasal Swab     Status: None   Collection Time: 11/11/24  8:41 PM   Specimen: Anterior Nasal Swab  Result Value Ref Range Status   SARS Coronavirus 2 by RT PCR NEGATIVE NEGATIVE Final    Comment: (NOTE) SARS-CoV-2 target nucleic acids are NOT DETECTED.  The SARS-CoV-2 RNA is generally detectable in upper and lower respiratory specimens during the acute phase of infection. The lowest concentration of SARS-CoV-2 viral copies this assay can detect is 250 copies / mL. A negative result does not preclude SARS-CoV-2 infection and should not be used as the sole basis for treatment or other patient management decisions.  A negative result may occur with improper specimen collection / handling, submission of specimen other than nasopharyngeal swab, presence of viral mutation(s) within the areas targeted by this assay, and inadequate number of viral copies (<250 copies / mL). A negative result must be combined with clinical observations, patient history, and epidemiological information.  Fact Sheet for Patients:   roadlaptop.co.za  Fact Sheet for Healthcare Providers: http://kim-miller.com/  This test is not yet approved or  cleared by the [place]  FDA and has been authorized for detection and/or diagnosis of SARS-CoV-2 by FDA under an Emergency Use Authorization (EUA).  This EUA will remain in effect (meaning this test can  be used) for the duration of the COVID-19 declaration under Section 564(b)(1) of the Act, 21 U.S.C. section 360bbb-3(b)(1), unless the authorization is terminated or revoked sooner.  Performed at [facility], [address] [place], [place] [address]  MRSA Next Gen by PCR, Nasal     Status: None   Collection Time: 11/12/24 12:52 AM   Specimen: Nasal Mucosa; Nasal Swab  Result Value Ref Range Status   MRSA by PCR Next Gen NOT DETECTED NOT DETECTED Final    Comment: (NOTE) The GeneXpert MRSA Assay (FDA approved for NASAL specimens only), is one component of a comprehensive MRSA colonization surveillance program. It is not intended to diagnose MRSA infection nor to guide or monitor treatment for MRSA infections. Test performance is not FDA approved in patients less than 16 years old. Performed at [facility], [address] [place], [place] [address]          Radiology Studies: No results found.         LOS: 2 days   Time spent= 35 mins    Deliliah Room, MD [facility]  If 7PM-7AM, please contact night-coverage  11/14/2024, 10:23 AM

## 2024-11-14 NOTE — Progress Notes (Signed)
 OT Cancellation Note  Patient Details Name: Chad Gross MRN: [id number] DOB: 07/12/1946   Cancelled Treatment:    Reason Eval/Treat Not Completed: Other (comment). PT present and preparing to work with the pt for session.   Delanna JINNY Lesches, OTR/L 11/14/2024, 5:06 PM

## 2024-11-14 NOTE — Progress Notes (Signed)
 PHARMACY - ANTICOAGULATION CONSULT NOTE  Pharmacy Consult for warfarin Indication:  hx recurrent DVT   Allergies[1]  Patient Measurements: Height: 5' 11 (180.3 cm) Weight: 83.5 kg (184 lb) IBW/kg (Calculated) : 75.3 HEPARIN  DW (KG): 83.5  Vital Signs: Temp: 97.7 F (36.5 C) (12/17 0514) BP: 116/69 (12/17 0514) Pulse Rate: 73 (12/17 0514)  Labs: Recent Labs    11/11/24 1803 11/12/24 0450 11/13/24 0520 11/14/24 0551  HGB 9.7* 8.8* 9.1* 9.2*  HCT 28.6* 26.6* 26.9* 27.3*  PLT 62* 51* 47* 58*  LABPROT 24.2* 30.4* 27.2* 19.0*  INR 2.1* 2.7* 2.4* 1.5*  CREATININE 1.93* 1.94* 1.72*  --     Estimated Creatinine Clearance: 37.1 mL/min (A) (by C-G formula based on SCr of 1.72 mg/dL (H)).   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 Pharmacy consulted on 11/11/24 to dose warfarin for patient.   Significant events:  - 12/15: Per Dr. Cloretta via [facility] msg, he recom. holding warfarin for Plt <30 or active bleeding.    Today, 11/14/2024: - INR is sub-therapeutic at 1.5 (pt did not get dose on 12/14 and 12/15) - hgb stable at 9.2 - plts up 58K - no bleeding documented  - drug-drug intxns: being on abx can take pt more sensitive to warfarin (on ceftriaxone )  Goal of Therapy:  INR 2-3 Monitor  platelets by anticoagulation protocol: Yes   Plan:  - warfarin 15 mg PO x1 today - daily INR - monitor plts closely and for s/sx bleeding  Weslie Rasmus P 11/14/2024,10:54 AM       [1] No Known Allergies

## 2024-11-14 NOTE — Progress Notes (Signed)
 Physical Therapy Treatment Patient Details Name: Chad Gross MRN: [id number] DOB: Nov 22, 1946 Today's Date: 11/14/2024   History of Present Illness Chad Gross is a 78 y.o. male resenting with a chief complaint of generalized weakness, fever, sepsis due to Pneumonia and right pleural effusion. 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 Cues for hand placement with transfers, pt pushing from seated surface to power up from bedside. Pt amb 100 ft with RW, decreased cadence but functional, step through gait pattern, conversational without dyspnea noted and pt denies shortness of breath, needing increased time and space with turns, cues for maintaining RW on floor to progress and avoid lifting device. Pt reports hopeful to d/c home tomorrow and continued agreement with HHPT recommendation. Upon return to room, pt requests to sit up in recliner; significant other entering room as therapist exits.    If plan is discharge home, recommend the following: A little help with walking and/or transfers;Assistance with cooking/housework;Help with stairs or ramp for entrance;Assist for transportation;A little help with bathing/dressing/bathroom   Can travel by private vehicle        Equipment Recommendations  None recommended by PT    Recommendations for Other Services       Precautions / Restrictions Precautions Precautions: Fall Restrictions Weight Bearing Restrictions Per Provider Order: No     Mobility  Bed Mobility Overal bed mobility: Needs Assistance Bed Mobility: Supine to Sit     Supine to sit: Supervision, Used rails     General bed mobility comments: supv, pt using bedrail as needed to power up to sitting    Transfers Overall transfer level:  Needs assistance Equipment used: Rolling walker (2 wheels) Transfers: Sit to/from Stand Sit to Stand: Contact guard assist           General transfer comment: CGA to power up from bed, cues to push from seated surface to rise    Ambulation/Gait Ambulation/Gait assistance: Contact guard assist Gait Distance (Feet): 100 Feet Assistive device: Rolling walker (2 wheels) Gait Pattern/deviations: Step-through pattern, Decreased stride length Gait velocity: decreased     General Gait Details: step through gait pattern, cues to maintain RW on floor while advancing, wide turns with increased steps, pt conversational throughout gait cycle without dyspnea noted and denies pain, dizziness, or SOB   Stairs             Wheelchair Mobility     Tilt Bed    Modified Rankin (Stroke Patients Only)       Balance Overall balance assessment: Needs assistance Sitting-balance support: Feet supported Sitting balance-Leahy Scale: Fair Sitting balance - Comments: not challenged   Standing balance support: Reliant on assistive device for balance, During functional activity, Bilateral upper extremity supported Standing balance-Leahy Scale: Poor                              Communication Communication Communication: No apparent difficulties  Cognition Arousal: Alert Behavior During Therapy: WFL for tasks assessed/performed   PT - Cognitive impairments: No apparent impairments                         Following commands: Intact      Cueing Cueing Techniques: Verbal cues  Exercises      General Comments General  comments (skin integrity, edema, etc.): Tele notified pre and post treatment session.      Pertinent Vitals/Pain Pain Assessment Pain Assessment: No/denies pain    Home Living                          Prior Function            PT Goals (current goals can now be found in the care plan section) Acute Rehab PT Goals Patient Stated  Goal: go home PT Goal Formulation: With patient/family Time For Goal Achievement: 11/26/24 Potential to Achieve Goals: Good Progress towards PT goals: Progressing toward goals    Frequency    Min 3X/week      PT Plan      Co-evaluation              AM-PAC PT 6 Clicks Mobility   Outcome Measure  Help needed turning from your back to your side while in a flat bed without using bedrails?: A Little Help needed moving from lying on your back to sitting on the side of a flat bed without using bedrails?: A Little Help needed moving to and from a bed to a chair (including a wheelchair)?: A Little Help needed standing up from a chair using your arms (e.g., wheelchair or bedside chair)?: A Little Help needed to walk in hospital room?: A Little Help needed climbing 3-5 steps with a railing? : A Little 6 Click Score: 18    End of Session Equipment Utilized During Treatment: Gait belt Activity Tolerance: Patient tolerated treatment well Patient left: in chair;with call bell/phone within reach;with chair alarm set;with family/visitor present (significant other arrived at [facility]) Nurse Communication: Mobility status PT Visit Diagnosis: Unsteadiness on feet (R26.81);Muscle weakness (generalized) (M62.81);Difficulty in walking, not elsewhere classified (R26.2)     Time: 8391-8374 PT Time Calculation (min) (ACUTE ONLY): 17 min  Charges:    $Gait Training: 8-22 mins PT General Charges $$ ACUTE PT VISIT: 1 Visit                     Tori Arlys Scatena PT, DPT 11/14/2024, 4:32 PM

## 2024-11-14 NOTE — TOC Progression Note (Signed)
 Transition of Care [facility]) - Progression Note    Patient Details  Name: Chad Gross MRN: [id number] Date of Birth: 1946-01-08  Transition of Care [facility]) CM/SW Contact  Jakevion Arney, Nathanel, RN Phone Number: 11/14/2024, 10:12 AM  Clinical Narrative: Ordered 3n1-no preference-[employer] rep Jermaine to deliver 3n1 to rm prior d/c. [facility] already set up for HHPT/OT. Has own transport home.      Expected Discharge Plan: Home w Home Health Services Barriers to Discharge: Continued Medical Work up               Expected Discharge Plan and Services   Discharge Planning Services: CM Consult Post Acute Care Choice: Home Health, Durable Medical Equipment Living arrangements for the past 2 months: Single Family Home                   DME Agency: [employer] Date DME Agency Contacted: 11/14/24 Time DME Agency Contacted: 1012 Representative spoke with at DME Agency: [place] HH Arranged: PT, OT HH Agency: [facility] Date [facility] Agency Contacted: 11/14/24 Time HH Agency Contacted: 1012 Representative spoke with at [facility] Agency: [facility]   Social Drivers of Health (SDOH) Interventions SDOH Screenings   Food Insecurity: No Food Insecurity (11/12/2024)  Housing: Low Risk (11/12/2024)  Transportation Needs: No Transportation Needs (11/12/2024)  Utilities: Not At Risk (11/12/2024)  Alcohol Screen: Low Risk (10/28/2024)  Depression (PHQ2-9): Low Risk (11/07/2024)  Financial Resource Strain: Low Risk (10/28/2024)  Physical Activity: Sufficiently Active (10/28/2024)  Social Connections: Moderately Isolated (11/12/2024)  Stress: No Stress Concern Present (10/28/2024)  Tobacco Use: Low Risk (11/11/2024)  Health Literacy: Adequate Health Literacy (02/14/2024)    Readmission Risk Interventions     No data to display

## 2024-11-14 NOTE — Progress Notes (Signed)
 Mobility Specialist - Progress Note   11/14/24 1346  Mobility  Activity Ambulated with assistance  Level of Assistance Contact guard assist, steadying assist  Assistive Device Front wheel walker  Distance Ambulated (ft) 20 ft  Range of Motion/Exercises Active  Activity Response Tolerated well  Mobility visit 1 Mobility  Mobility Specialist Start Time (ACUTE ONLY) 1336  Mobility Specialist Stop Time (ACUTE ONLY) 1346  Mobility Specialist Time Calculation (min) (ACUTE ONLY) 10 min   Pt was found getting up from bed. Requested assistance to bathroom. No complaints and returned to bed with all needs met. Call bell in reach.   Erminio Leos,  Mobility Specialist Can be reached via Secure Chat

## 2024-11-14 NOTE — Plan of Care (Signed)

## 2024-11-15 DIAGNOSIS — J189 Pneumonia, unspecified organism: Secondary | ICD-10-CM | POA: Diagnosis not present

## 2024-11-15 LAB — CBC
HCT: 28.5 % — ABNORMAL LOW (ref 39.0–52.0)
Hemoglobin: 9.5 g/dL — ABNORMAL LOW (ref 13.0–17.0)
MCH: 32.1 pg (ref 26.0–34.0)
MCHC: 33.3 g/dL (ref 30.0–36.0)
MCV: 96.3 fL (ref 80.0–100.0)
Platelets: 69 K/uL — ABNORMAL LOW (ref 150–400)
RBC: 2.96 MIL/uL — ABNORMAL LOW (ref 4.22–5.81)
RDW: 15.2 % (ref 11.5–15.5)
WBC: 34.7 K/uL — ABNORMAL HIGH (ref 4.0–10.5)
nRBC: 0 % (ref 0.0–0.2)

## 2024-11-15 LAB — PROTIME-INR
INR: 1.4 — ABNORMAL HIGH (ref 0.8–1.2)
Prothrombin Time: 18.1 s — ABNORMAL HIGH (ref 11.4–15.2)

## 2024-11-15 MED ORDER — WARFARIN SODIUM 5 MG PO TABS
15.0000 mg | ORAL_TABLET | Freq: Once | ORAL | Status: AC
  Administered 2024-11-15: 16:00:00 15 mg via ORAL
  Filled 2024-11-15: qty 3

## 2024-11-15 MED ORDER — AMOXICILLIN-POT CLAVULANATE 875-125 MG PO TABS: 1.0000 | ORAL_TABLET | Freq: Two times a day (BID) | ORAL | 0 refills | Status: DC

## 2024-11-15 NOTE — Progress Notes (Signed)
 Mobility Specialist - Progress Note   11/15/24 1200  Mobility  Activity Ambulated with assistance  Level of Assistance Minimal assist, patient does 75% or more  Assistive Device Front wheel walker  Distance Ambulated (ft) 130 ft  Range of Motion/Exercises Active  Activity Response Tolerated well  Mobility visit 1 Mobility  Mobility Specialist Start Time (ACUTE ONLY) 1140  Mobility Specialist Stop Time (ACUTE ONLY) 1200  Mobility Specialist Time Calculation (min) (ACUTE ONLY) 20 min   Pt was found in bathroom and agreeable to mobilize. Grew fatigued with session. At [facility] returned to bed with all needs met. Call bell in reach.   Erminio Leos,  Mobility Specialist Can be reached via Secure Chat

## 2024-11-15 NOTE — Discharge Summary (Signed)
 Physician Discharge Summary   Patient: Chad Gross MRN: [id number] DOB: March 09, 1946  Admit date:     11/11/2024  Discharge date: 11/15/2024  Discharge Physician: Deliliah Room   PCP: de [place], Quintin PARAS, MD   Recommendations at discharge:    F/u with your PCP in one week. Needs repeat CBC F/u with oncologist, Dr Cloretta on the scheduled appointment. Continue taking meds as prescribed.  Discharge Diagnoses: Principal Problem:   CAP (community acquired pneumonia) Active Problems:   Severe sepsis (HCC)   AKI (acute kidney injury)   Hyponatremia   Generalized weakness    Hospital Course: 78 y.o. 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   Severe sepsis secondary to possible bacterial CAP,POA:Resolved SARS-CoV-2 PCR negative.  Currently stable on room air.   Follow-up blood cultures-E.Coli.   MRSA PCR screen negative.    Leukocytosis: Combination of CLL and infection. Discussed with Dr Cloretta on the discharge day. No concerns. Patient has an appointment with him on 11/20/24   E.coli Bacteremia - Ecoli from 12/14 culture Received IV Rocephin  2g daily, transitioned to oral augmentin  BID x 5 days on discharge.   AKI on CKD stage IIIa Likely prerenal in etiology in the setting of sepsis/hypotension/dehydration.  Baseline creatinine roughly 1.4.  Received IVF    Mild hyponatremia In the setting of poor p.o. intake.  Received IVF.   Generalized weakness/Physical deconditioning,POA: PT/OT eval, fall precautions. HHS   CLL He was maintained off of systemic therapy since he completed a course of  Bendamustine /rituximab  in November 2024 but given recent clinical and radiographic evidence of disease progression he was started on acalabrutinib  4 days ago.   Discussed with Dr Cloretta on the day of the discharge. He has an appointment on 11/20/24.   Chronic DVT/history of PE Patient is on indefinite anticoagulation with Coumadin .  Does have chronic anemia and thrombocytopenia but no overt bleeding.  Coumadin  dosing per pharmacy.   Chronic anemia and thrombocytopenia In the setting of CLL/cancer treatment.  No overt bleeding.     Hypertension Hold antihypertensives at this time.   Hyperlipidemia Continue pravastatin .   Disposition: Home with HHS       Consultants: None Procedures performed: None  Disposition: Home health Diet recommendation:  Regular diet DISCHARGE MEDICATION: Allergies as of 11/15/2024   No Known Allergies      Medication List     TAKE these medications    acetaminophen  500 MG tablet Commonly known as: TYLENOL  Take 500-1,000 mg by mouth every 6 (six) hours as needed for moderate pain or headache.   amoxicillin -clavulanate 875-125 MG tablet Commonly known as: AUGMENTIN  Take 1 tablet by mouth every 12 (twelve) hours.   Calquence  100 MG tablet Generic drug: acalabrutinib  maleate Take 1 tablet (100 mg total) by mouth 2 (two) times daily.   CoQ10 100 MG Caps Take 100 mg by mouth daily.   GLUCOSAMINE CHONDROITIN COMPLX PO Take 1 tablet by mouth daily.   hydrochlorothiazide  12.5 MG tablet Commonly known as: HYDRODIURIL  TAKE 1 TABLET BY MOUTH EVERY DAY   losartan  50 MG tablet Commonly known as: COZAAR  TAKE 1 TABLET BY MOUTH EVERY DAY   meclizine  12.5 MG tablet Commonly known as: ANTIVERT  Take 1 tablet (12.5 mg total) by mouth 3 (three) times  daily as needed for dizziness.   pravastatin  40 MG tablet Commonly known as: PRAVACHOL  TAKE 1 TABLET BY MOUTH EVERY DAY   traMADol  50 MG tablet Commonly known as: ULTRAM  Take 1 tablet (50 mg  total) by mouth every 6 (six) hours as needed for moderate pain or severe pain.   triamcinolone  acetonide 40 MG/ML SUSP 40 mg, mupirocin cream 2 % CREA 15 g Apply 1 application  topically as needed (Face).   triamcinolone  cream 0.1 % Commonly known as: KENALOG  Apply 1 Application topically 2 (two) times daily as needed.   warfarin 10 MG tablet Commonly known as: COUMADIN  TAKE 1 TABLET BY MOUTH DAILY AT 6:00 AM What changed: See the new instructions.   warfarin 2.5 MG tablet Commonly known as: COUMADIN  TAKE 2.5 MG WITH 10 MG TABLET ON MONDAY & THURSDAY FOR DOSE OF 12.5 MG What changed: Another medication with the same name was changed. Make sure you understand how and when to take each.               Durable Medical Equipment  (From admission, onward)           Start     Ordered   11/14/24 1010  For home use only DME 3 n 1  Once        11/14/24 1009            Contact information for follow-up providers     de [place], Quintin PARAS, MD. Schedule an appointment as soon as possible for a visit in 1 week(s).   Specialty: Family Medicine Contact information: [address] [place] [address] [phone]         Cloretta Arley NOVAK, MD. Schedule an appointment as soon as possible for a visit in 1 month(s).   Specialty: Oncology Contact information: [address] [place] [address] [phone]              Contact information for after-discharge care     Durable Medical Equipment     [facility] (DME) Follow up.   Service: Durable Medical Equipment Why: 3n1 Contact information: [address] [employer] [place]  [address] [phone]             Home Medical Care     [facility]) .   Service: Home Health Services Why: HHPT/OT Contact information: [address] [place]  [address] [phone]                    Discharge  Exam: Fredricka Weights   11/11/24 1751  Weight: 83.5 kg   Constitutional: NAD, calm, comfortable Eyes: PERRL, lids and conjunctivae normal ENMT: Mucous membranes are moist. Posterior pharynx clear of any exudate or lesions.Normal dentition.  Neck: normal, supple, no masses, no thyromegaly Respiratory: clear to auscultation bilaterally, no wheezing, no crackles. Normal respiratory effort. No accessory muscle use.  Cardiovascular: Regular rate and rhythm, no murmurs / rubs / gallops. No extremity edema. 2+ pedal pulses. No carotid bruits.  Abdomen: no tenderness, no masses palpated. No hepatosplenomegaly. Bowel sounds positive.  Musculoskeletal: no clubbing / cyanosis. No joint deformity upper and lower extremities. Good ROM, no contractures. Normal muscle tone.  Skin: no rashes, lesions, ulcers. No induration Neurologic: CN 2-12 grossly intact. Sensation intact, DTR normal. Strength 5/5 x all 4 extremities.  Psychiatric: Normal judgment and insight. Alert and oriented x 3. Normal mood.    Condition at discharge: good  The results of significant diagnostics from this hospitalization (including imaging, microbiology, ancillary and laboratory) are listed below for reference.   Imaging Studies: DG Chest 2 View Result Date: 11/11/2024 EXAM: 2 VIEW(S) XRAY OF THE CHEST 11/11/2024 07:41:00 PM COMPARISON: 02/10/2024 CLINICAL HISTORY: sepsis? FINDINGS: LUNGS AND PLEURA: Right lower lobe airspace opacity. Left base airspace opacity. Small right pleural effusion. No pneumothorax. HEART AND MEDIASTINUM: Aortic calcification. No acute abnormality of the cardiac and mediastinal silhouettes. BONES AND SOFT TISSUES: No acute osseous abnormality. IMPRESSION: 1. Right lower lobe and left base airspace opacities, suspicious for pneumonia. 2. Small right pleural effusion. Electronically signed by: Oneil Devonshire MD 11/11/2024 07:43 PM EST RP Workstation: [id number]   CT CHEST ABDOMEN PELVIS WO CONTRAST Result Date:  10/29/2024 EXAM: CT CHEST, ABDOMEN AND PELVIS WITHOUT CONTRAST 10/23/2024 02:55:35 PM TECHNIQUE: CT of the chest, abdomen and pelvis was performed without the administration of intravenous contrast. Multiplanar reformatted images are provided for review. Automated exposure control, iterative reconstruction, and/or weight based adjustment of the mA/kV was utilized to reduce the radiation dose to as low as reasonably achievable. COMPARISON: Abdominal pelvic CT of 08/30/2023. Most recent chest CT of 06/25/2023. CLINICAL HISTORY: restaging CLL, f/u abdominal mass * Tracking Code: BO * FINDINGS: CHEST: MEDIASTINUM AND LYMPH NODES: Heart: Moderate cardiomegaly. 3 vessel coronary artery calcification. Normal aortic caliber. No pericardial effusion. Mediastinal Lymph Nodes: Right paratracheal index node measures 2.1 cm today versus 1.1 cm on the prior. Axillary Lymph Nodes: Increased size and number of bilateral axillary nodes. Left axillary nodes measuring maximally 1.8 cm today versus 1.1 cm previously. Central Airways: The central airways are clear. LUNGS AND PLEURA: Lungs: Interstitial Thickening within the lung bases, greater right than left. Innumerable hyperattenuating foci within the right lung base are suspicious for aspirated contrast and are similar to the prior. New nodule on the left major fissure of 7 mm on image 188/3 may represent an enlarged subpleural lymph node. Pleura: Moderate right pleural effusion, new. Chronic right pleural thickening. Anterior left pleural thickening versus enlarged internal mammary node, 1.3 cm on image 36/2 versus 9 mm previously. No pneumothorax. VASCULATURE: Thoracic aortic atherosclerosis. ABDOMEN AND PELVIS: LIVER: High central hepatic cyst of 11 mm. Show contrast in the background GALLBLADDER AND BILE DUCTS: Grossly normal gallbladder. Common duct not well visualized secondary to noncontrast technique and the extent of abdominal adenopathy. SPLEEN: Spleen measures maximally  15.0 cm in transverse dimension today versus 14.0 cm previously. PANCREAS: Normal, without duct dilatation or acute inflammation. ADRENAL GLANDS: Left adrenal gland not well visualized. Normal right adrenal gland. KIDNEYS, URETERS AND BLADDER: Kidneys: 2.7 cm lower pole left radial fluid density lesion is likely a cyst.no follow up indicated. No renal calculi. Bladder: Bladder is compressed by adenopathy without gross abnormality. GI AND BOWEL: Stomach: Gastric displacement by adenopathy. Duodenum: Transverse duodenal narrowing secondary to surrounding adenopathy. Small Bowel: Normal small bowel caliber. Colon: Scattered colonic diverticula. Terminal Ileum: Normal terminal ileum. Appendix: Normal appendix. Bowel Obstruction: There is no bowel obstruction. REPRODUCTIVE ORGANS: Normal prostate. Penile pump. PERITONEUM AND RETROPERITONEUM: Massive confluent abdominal retroperitoneal adenopathy. Nodal mass at the level of the interpolar kidneys measures 19.1 x 16.4 cm (image 74/2) compared to 17.8 x 15.3 cm on the prior. Marked bilateral pelvic sidewall adenopathy. Index left external iliac nodal mass measures 8.8 x 6.0 cm today versus 5.7 x 4.1 cm in the prior. Progressive small bowel mesenteric extension or contiguous adenopathy. No ascites. No free air. VASCULATURE: Abdominal aortic atherosclerosis. IVC filter. Aorta is normal in  caliber. BONES AND SOFT TISSUES: Presumed bone island in the right iliac, similar to the prior. S-shaped thoracolumbar spine curvature. No acute osseous abnormality. No focal soft tissue abnormality. . IMPRESSION: 1. moderate disease progression, as evidenced by increased adenopathy within the chest, abdomen, and pelvis as detailed above. 2. New moderate right pleural effusion. Electronically signed by: Rockey Kilts MD 10/29/2024 06:27 PM EST RP Workstation: [id number]    Microbiology: Results for orders placed or performed during the hospital encounter of 11/11/24  Culture, blood  (Routine x 2)     Status: Abnormal   Collection Time: 11/11/24  6:03 PM   Specimen: BLOOD  Result Value Ref Range Status   Specimen Description   Final    BLOOD LEFT ANTECUBITAL Performed at [facility], [address], [place], [place] [address]    Special Requests   Final    BOTTLES DRAWN AEROBIC AND ANAEROBIC Blood Culture adequate volume Performed at [facility], [address] [place], [place] [address]    Culture  Setup Time   Final    GRAM NEGATIVE RODS IN BOTH AEROBIC AND ANAEROBIC BOTTLES CRITICAL RESULT CALLED TO, READ BACK BY AND VERIFIED WITH: PHARMD MICHELLE BELL ON 11/12/24 @ 1943 BY DRT Performed at [facility], [address], [place], [place] [address]    Culture ESCHERICHIA COLI (A)  Final   Report Status 11/14/2024 FINAL  Final   Organism ID, Bacteria ESCHERICHIA COLI  Final      Susceptibility   Escherichia coli - MIC*    AMPICILLIN <=2 SENSITIVE Sensitive     CEFAZOLIN  (NON-URINE) <=1 SENSITIVE Sensitive     CEFEPIME  <=0.12 SENSITIVE Sensitive     ERTAPENEM <=0.12 SENSITIVE Sensitive     CEFTRIAXONE  <=0.25 SENSITIVE Sensitive     CIPROFLOXACIN >=4 RESISTANT Resistant     GENTAMICIN <=1 SENSITIVE Sensitive     MEROPENEM <=0.25 SENSITIVE Sensitive     TRIMETH/SULFA >=320 RESISTANT Resistant     AMPICILLIN/SULBACTAM <=2 SENSITIVE Sensitive     PIP/TAZO Value in next row Sensitive      <=4 SENSITIVEThis is a modified FDA-approved test that has been validated and its performance characteristics determined by the reporting laboratory.  This laboratory is certified under the Clinical Laboratory Improvement Amendments CLIA as qualified to perform high complexity clinical laboratory testing.    * ESCHERICHIA COLI  Blood Culture ID Panel (Reflexed)     Status: Abnormal   Collection Time: 11/11/24  6:03 PM  Result Value Ref Range Status   Enterococcus faecalis NOT DETECTED NOT DETECTED Final   Enterococcus Faecium NOT  DETECTED NOT DETECTED Final   Listeria monocytogenes NOT DETECTED NOT DETECTED Final   Staphylococcus species NOT DETECTED NOT DETECTED Final   Staphylococcus aureus (BCID) NOT DETECTED NOT DETECTED Final   Staphylococcus epidermidis NOT DETECTED NOT DETECTED Final   Staphylococcus lugdunensis NOT DETECTED NOT DETECTED Final   Streptococcus species NOT DETECTED NOT DETECTED Final   Streptococcus agalactiae NOT DETECTED NOT DETECTED Final   Streptococcus pneumoniae NOT DETECTED NOT DETECTED Final   Streptococcus pyogenes NOT DETECTED NOT DETECTED Final   A.calcoaceticus-baumannii NOT DETECTED NOT DETECTED Final   Bacteroides fragilis NOT DETECTED NOT DETECTED Final   Enterobacterales DETECTED (A) NOT DETECTED Final    Comment: Enterobacterales represent a large order of gram negative bacteria, not a single organism. CRITICAL RESULT CALLED TO, READ BACK BY AND VERIFIED WITH: PHARMD MICHELLE BELL ON 11/12/24 @ 1943 BY DRT    Enterobacter  cloacae complex NOT DETECTED NOT DETECTED Final   Escherichia coli DETECTED (A) NOT DETECTED Final    Comment: CRITICAL RESULT CALLED TO, READ BACK BY AND VERIFIED WITH: PHARMD MICHELLE BELL ON 11/12/24 @ 1943 BY DRT    Klebsiella aerogenes NOT DETECTED NOT DETECTED Final   Klebsiella oxytoca NOT DETECTED NOT DETECTED Final   Klebsiella pneumoniae NOT DETECTED NOT DETECTED Final   Proteus species NOT DETECTED NOT DETECTED Final   Salmonella species NOT DETECTED NOT DETECTED Final   Serratia marcescens NOT DETECTED NOT DETECTED Final   Haemophilus influenzae NOT DETECTED NOT DETECTED Final   Neisseria meningitidis NOT DETECTED NOT DETECTED Final   Pseudomonas aeruginosa NOT DETECTED NOT DETECTED Final   Stenotrophomonas maltophilia NOT DETECTED NOT DETECTED Final   Candida albicans NOT DETECTED NOT DETECTED Final   Candida auris NOT DETECTED NOT DETECTED Final   Candida glabrata NOT DETECTED NOT DETECTED Final   Candida krusei NOT DETECTED NOT DETECTED  Final   Candida parapsilosis NOT DETECTED NOT DETECTED Final   Candida tropicalis NOT DETECTED NOT DETECTED Final   Cryptococcus neoformans/gattii NOT DETECTED NOT DETECTED Final   CTX-M ESBL NOT DETECTED NOT DETECTED Final   Carbapenem resistance IMP NOT DETECTED NOT DETECTED Final   Carbapenem resistance KPC NOT DETECTED NOT DETECTED Final   Carbapenem resistance NDM NOT DETECTED NOT DETECTED Final   Carbapenem resist OXA 48 LIKE NOT DETECTED NOT DETECTED Final   Carbapenem resistance VIM NOT DETECTED NOT DETECTED Final    Comment: Performed at [facility], [address], [place], [place] [address]  Culture, blood (Routine x 2)     Status: None (Preliminary result)   Collection Time: 11/11/24  8:25 PM   Specimen: BLOOD  Result Value Ref Range Status   Specimen Description   Final    BLOOD BLOOD RIGHT FOREARM Performed at [facility], [address], [place], [place] [address]    Special Requests   Final    BOTTLES DRAWN AEROBIC AND ANAEROBIC Blood Culture results may not be optimal due to an inadequate volume of blood received in culture bottles Performed at [facility], [address], [place], [place] [address]    Culture   Final    NO GROWTH 4 DAYS Performed at [facility], [address], [place], [place] [address]    Report Status PENDING  Incomplete  SARS Coronavirus 2 by RT PCR (hospital order, performed in [facility] hospital lab) *cepheid single result test* Anterior Nasal Swab     Status: None   Collection Time: 11/11/24  8:41 PM   Specimen: Anterior Nasal Swab  Result Value Ref Range Status   SARS Coronavirus 2 by RT PCR NEGATIVE NEGATIVE Final    Comment: (NOTE) SARS-CoV-2 target nucleic acids are NOT DETECTED.  The SARS-CoV-2 RNA is generally detectable in upper and lower respiratory specimens during the acute phase of infection. The lowest concentration of SARS-CoV-2 viral copies this assay can detect is  250 copies / mL. A negative result does not preclude SARS-CoV-2 infection and should not be used as the sole basis for treatment or other patient management decisions.  A negative result may occur with improper specimen collection / handling, submission of specimen other than nasopharyngeal swab, presence of viral mutation(s) within the areas targeted by this assay, and inadequate number of viral copies (<250 copies / mL). A negative result must be combined with clinical observations, patient history, and epidemiological information.  Fact  Sheet for Patients:   roadlaptop.co.za  Fact Sheet for Healthcare Providers: http://kim-miller.com/  This test is not yet approved or  cleared by the [place]  FDA and has been authorized for detection and/or diagnosis of SARS-CoV-2 by FDA under an Emergency Use Authorization (EUA).  This EUA will remain in effect (meaning this test can be used) for the duration of the COVID-19 declaration under Section 564(b)(1) of the Act, 21 U.S.C. section 360bbb-3(b)(1), unless the authorization is terminated or revoked sooner.  Performed at [facility], [address] [place], [place] [address]   MRSA Next Gen by PCR, Nasal     Status: None   Collection Time: 11/12/24 12:52 AM   Specimen: Nasal Mucosa; Nasal Swab  Result Value Ref Range Status   MRSA by PCR Next Gen NOT DETECTED NOT DETECTED Final    Comment: (NOTE) The GeneXpert MRSA Assay (FDA approved for NASAL specimens only), is one component of a comprehensive MRSA colonization surveillance program. It is not intended to diagnose MRSA infection nor to guide or monitor treatment for MRSA infections. Test performance is not FDA approved in patients less than 58 years old. Performed at [facility], [address], [place], [place] [address]   Expectorated Sputum Assessment w Gram Stain, Rflx to Resp Cult      Status: None   Collection Time: 11/14/24  6:36 PM   Specimen: Expectorated Sputum  Result Value Ref Range Status   Specimen Description EXPECTORATED SPUTUM  Final   Special Requests Immunocompromised  Final   Sputum evaluation   Final    THIS SPECIMEN IS ACCEPTABLE FOR SPUTUM CULTURE Performed at [facility], [address], [place], [place] [address]    Report Status 11/14/2024 FINAL  Final  Culture, Respiratory w Gram Stain     Status: None (Preliminary result)   Collection Time: 11/14/24  6:36 PM  Result Value Ref Range Status   Specimen Description   Final    EXPECTORATED SPUTUM Performed at [facility], [address], [place], [place] [address]    Special Requests   Final    Immunocompromised Reflexed from [id number] Performed at [facility], [address], [place], [place] [address]    Gram Stain   Final    RARE WBC SEEN NO ORGANISMS SEEN Performed at [facility], [address] [place], [place] [address]    Culture PENDING  Incomplete   Report Status PENDING  Incomplete   *Note: Due to a large number of results and/or encounters for the requested time period, some results have not been displayed. A complete set of results can be found in Results Review.    Labs: CBC: Recent Labs  Lab 11/11/24 1803 11/12/24 0450 11/13/24 0520 11/14/24 0551 11/15/24 0520  WBC 27.2* 30.5* 25.6* 30.8* 34.7*  NEUTROABS 4.5  --   --   --   --   HGB 9.7* 8.8* 9.1* 9.2* 9.5*  HCT 28.6* 26.6* 26.9* 27.3* 28.5*  MCV 95.3 96.0 95.1 95.1 96.3  PLT 62* 51* 47* 58* 69*   Basic Metabolic Panel: Recent Labs  Lab 11/11/24 1803 11/12/24 0450 11/13/24 0520  NA 134* 135 134*  K 4.6 4.3 4.0  CL 104 105 105  CO2 22 21* 20*  GLUCOSE 110* 97 90  BUN 46* 46* 40*  CREATININE 1.93* 1.94* 1.72*  CALCIUM 9.6 9.0 8.6*   Liver Function Tests: Recent Labs  Lab 11/11/24 1803  AST  26  ALT 12  ALKPHOS 40  BILITOT 0.6  PROT  7.3  ALBUMIN 3.3*   CBG: No results for input(s): GLUCAP in the last 168 hours.  Discharge time spent: 40 minutes.  Signed: Deliliah Room, MD [facility] 11/15/2024

## 2024-11-15 NOTE — Progress Notes (Signed)
 OT Cancellation Note  Patient Details Name: DECOREY WAHLERT MRN: [id number] DOB: 1946/01/20   Cancelled Treatment:    Reason Eval/Treat Not Completed: Patient declined. He stated, not today. He referenced he will be discharging home soon.   Delanna JINNY Lesches, OTR/L 11/15/2024, 2:04 PM

## 2024-11-15 NOTE — Progress Notes (Signed)
 PHARMACY - ANTICOAGULATION CONSULT NOTE  Pharmacy Consult for warfarin Indication:  hx recurrent DVT   Allergies[1]  Patient Measurements: Height: 5' 11 (180.3 cm) Weight: 83.5 kg (184 lb) IBW/kg (Calculated) : 75.3 HEPARIN DW (KG): 83.5  Vital Signs: Temp: 98.7 F (37.1 C) (12/18 0513) BP: 102/60 (12/18 0513) Pulse Rate: 72 (12/18 0513)  Labs: Recent Labs    11/13/24 0520 11/14/24 0551 11/15/24 0520  HGB 9.1* 9.2* 9.5*  HCT 26.9* 27.3* 28.5*  PLT 47* 58* 69*  LABPROT 27.2* 19.0* 18.1*  INR 2.4* 1.5* 1.4*  CREATININE 1.72*  --   --     Estimated Creatinine Clearance: 37.1 mL/min (A) (by C-G formula based on SCr of 1.72 mg/dL (H)).   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 Pharmacy consulted on 11/11/24 to dose warfarin for patient.   Significant events:  - 12/15: Per Dr. Cloretta via [facility] msg, he recom. holding warfarin for Plt <30 or active bleeding.    Today, 11/15/2024: - INR is sub-therapeutic at 1.4 (pt did not get dose on 12/14 and 12/15) - hgb stable at 9.5 - plts trending up with 69K  - no bleeding documented  - drug-drug intxns: being on abx can take pt more sensitive to warfarin (on augmentin )  Goal of Therapy:  INR 2-3 Monitor platelets by anticoagulation  protocol: Yes   Plan:  - Plan is to discharge patient home today.  Per Dr. Cloretta via [facility] msg, bridging is not needed at this time for sub-therapeutic INR - If patient does not get discharged today, will give warfarin 15 mg PO x1 this afternoon - daily INR - monitor plts closely and for s/sx bleeding   Kenzleigh Sedam P 11/15/2024,1:01 PM        [1] No Known Allergies

## 2024-11-15 NOTE — TOC Transition Note (Signed)
 Transition of Care [facility]) - Discharge Note  Patient Details  Name: Chad Gross MRN: [id number] Date of Birth: 25-May-1946  [facility]) CM/SW Contact:  Duwaine GORMAN Aran, LCSW Phone Number: 11/15/2024, 12:23 PM  Clinical Narrative: CSW notified Jermaine with [employer] that patient is discharging home today, so 3N1 was delivered to patient's room. HHPT/OT orders have been placed by hospitalist. Patient set up with [facility]. Care management signing off.  Final next level of care: Home w Home Health Services Barriers to Discharge: Barriers Resolved  Patient Goals and CMS Choice Patient states their goals for this hospitalization and ongoing recovery are:: Home CMS Medicare.gov Compare Post Acute Care list provided to:: Patient Represenative (must comment) Choice offered to / list presented to : Spouse Chauncey ownership interest in [facility].provided to:: Spouse   Discharge Plan and Services Additional resources added to the After Visit Summary for   Discharge Planning Services: CM Consult Post Acute Care Choice: Home Health, Durable Medical Equipment          DME Arranged: 3-N-1 DME Agency: [employer] Date DME Agency Contacted: 11/14/24 Time DME Agency Contacted: 1012 Representative spoke with at DME Agency: [place] HH Arranged: PT, OT HH Agency: [facility] Date [facility] Agency Contacted: 11/14/24 Time HH Agency Contacted: 1012 Representative spoke with at [facility] Agency: [place]  Social Drivers of Health (SDOH) Interventions SDOH Screenings   Food Insecurity: No Food Insecurity (11/12/2024)  Housing: Low Risk (11/12/2024)  Transportation Needs: No Transportation Needs (11/12/2024)  Utilities: Not At Risk (11/12/2024)  Alcohol Screen: Low Risk (10/28/2024)  Depression (PHQ2-9): Low Risk (11/07/2024)  Financial Resource Strain: Low Risk (10/28/2024)  Physical Activity: Sufficiently Active (10/28/2024)  Social Connections: Moderately  Isolated (11/12/2024)  Stress: No Stress Concern Present (10/28/2024)  Tobacco Use: Low Risk (11/11/2024)  Health Literacy: Adequate Health Literacy (02/14/2024)   Readmission Risk Interventions     No data to display

## 2024-11-16 ENCOUNTER — Encounter (HOSPITAL_BASED_OUTPATIENT_CLINIC_OR_DEPARTMENT_OTHER): Payer: Self-pay | Admitting: Family Medicine

## 2024-11-16 ENCOUNTER — Telehealth: Payer: Self-pay

## 2024-11-16 LAB — CULTURE, BLOOD (ROUTINE X 2): Culture: NO GROWTH

## 2024-11-16 NOTE — Patient Instructions (Signed)
 Visit Information  Thank you for taking time to visit with me today. Please don't hesitate to contact me if I can be of assistance to you before our next scheduled telephone appointment.  Our next appointment is by telephone on 11/21/24 at 9:30 am  Following is a copy of your care plan:   Goals Addressed             This Visit's Progress    VBCI Transitions of Care (TOC) Care Plan       Problems:  Recent Hospitalization for treatment of community acquired pneumonia No Hospital Follow Up Provider appointment attempted to assist patient with scheduling hospital follow up appointment. No appointment with provider until 12/11/23. Advised patient to call her primary care provider office to attempt to schedule an earlier visit.   Goal:  Over the next 30 days, the patient will not experience hospital readmission  Interventions:  Transitions of Care: Doctor Visits  - discussed the importance of doctor visits Communication with primary care provider re: enrollment in the 30 day TOC program Encouraged deep breathing and coughing every 2 hours to mobilize secretions  Monitor oxygen level with oximeter to ensure oxygen level in the 90's. Report any symptoms of shortness of breath and or low oxygen levels less than 90% to provider.  Seek emergency medical services for severe symptoms.  Take antibiotics until completed Take in 8-10 glasses / day of water/ fluid to thin secretions  Reviewed upcoming provider visits Medications reviewed and compliance discussed/ advised. Advised to call and scheduled hospital follow up visit with primary care provider   Patient Self Care Activities:  Attend all scheduled provider appointments Call pharmacy for medication refills 3-7 days in advance of running out of medications Call provider office for new concerns or questions  Notify RN Care Manager of TOC call rescheduling needs Participate in Transition of Care Program/Attend TOC scheduled calls Take  medications as prescribed   Stay hydrated drinking at least 8-10 glasses of water/ fluid daily Call and schedule a hospital follow up visit with your primary care provider Take your antibiotics until completed Contact your provider for any new/ ongoing symptoms Seek emergency medical services for severe symptoms.   Plan:  Telephone follow up appointment with care management team member scheduled for:  11/21/24 AT 9:30 am The patient has been provided with contact information for the care management team and has been advised to call with any health related questions or concerns.         Patient verbalizes understanding of instructions and care plan provided today and agrees to view in MyChart. Active MyChart status and patient understanding of how to access instructions and care plan via MyChart confirmed with patient.     The patient has been provided with contact information for the care management team and has been advised to call with any health related questions or concerns.   Please call the care guide team at [phone] if you need to cancel or reschedule your appointment.   Please call the Suicide and Crisis Lifeline: 988 call the [place]  National Suicide Prevention Lifeline: 616-101-8953 or TTY: 415-807-7010 TTY (770)637-4944) to talk to a trained counselor call 1-800-273-TALK (toll free, 24 hour hotline) if you are experiencing a Mental Health or Behavioral Health Crisis or need someone to talk to.  Arvin Seip RN, BSN, CCM [employer], Population Health Case Manager Phone: [phone]

## 2024-11-16 NOTE — Transitions of Care (Post Inpatient/ED Visit) (Signed)
 "  11/16/2024  Name: Chad Gross MRN: [id number] DOB: 11/07/1946  Today's TOC FU Call Status: Today's TOC FU Call Status:: Successful TOC FU Call Completed TOC FU Call Complete Date: 11/16/24  Patient's Name and Date of Birth confirmed. Name, DOB  Transition Care Management Follow-up Telephone Call Date of Discharge: 11/15/24 Discharge Facility[facility]) Type of Discharge: Inpatient Admission Primary Inpatient Discharge Diagnosis:: CAP How have you been since you were released from the hospital?: Better Any questions or concerns?: No  Items Reviewed: Did you receive and understand the discharge instructions provided?: Yes Medications obtained,verified, and reconciled?: Yes (Medications Reviewed) Any new allergies since your discharge?: No Dietary orders reviewed?: Yes Type of Diet Ordered:: regular diet Do you have support at home?: Yes People in Home [RPT]: significant other Name of Support/Comfort Primary Source: Kate Ion  Medications Reviewed Today: Medications Reviewed Today     Reviewed by Nghia Mcentee E, RN (Registered Nurse) on 11/16/24 at 1252  Med List Status: <None>   Medication Order Taking? Sig Documenting Provider Last Dose Status Informant  acalabrutinib  maleate (CALQUENCE ) 100 MG tablet [id number] Yes Take 1 tablet (100 mg total) by mouth 2 (two) times daily. Cloretta Arley NOVAK, MD  Active   acetaminophen  (TYLENOL ) 500 MG tablet [id number] Yes Take 500-1,000 mg by mouth every 6 (six) hours as needed for moderate pain or headache. [provider]  Active Self  amoxicillin -clavulanate (AUGMENTIN ) 875-125 MG tablet [id number] Yes Take 1 tablet by mouth every 12 (twelve) hours. Rashid, Farhan, MD  Active   Coenzyme Q10 (COQ10) 100 MG CAPS [id number] Yes Take 100 mg by mouth daily. [provider]  Active Self  GLUCOSAMINE CHONDROITIN COMPLX PO [id number] Yes Take 1 tablet by mouth daily. [provider]  Active Self   hydrochlorothiazide  (HYDRODIURIL ) 12.5 MG tablet [id number] Yes TAKE 1 TABLET BY MOUTH EVERY DAY de [place], Raymond J, MD  Active   losartan  (COZAAR ) 50 MG tablet [id number] Yes TAKE 1 TABLET BY MOUTH EVERY DAY [place], Quintin PARAS, MD  Active   meclizine  (ANTIVERT ) 12.5 MG tablet [id number] Yes Take 1 tablet (12.5 mg total) by mouth 3 (three) times daily as needed for dizziness. Cloretta Arley NOVAK, MD  Active   pravastatin  (PRAVACHOL ) 40 MG tablet [id number] Yes TAKE 1 TABLET BY MOUTH EVERY DAY [place], Quintin PARAS, MD  Active   traMADol  (ULTRAM ) 50 MG tablet [id number] Yes Take 1 tablet (50 mg total) by mouth every 6 (six) hours as needed for moderate pain or severe pain. de [place], Quintin PARAS, MD  Active   triamcinolone  acetonide 40 MG/ML SUSP 40 mg, mupirocin cream 2 % CREA 15 g [id number] Yes Apply 1 application  topically as needed (Face). [provider]  Active   triamcinolone  cream (KENALOG ) 0.1 % [id number] Yes Apply 1 Application topically 2 (two) times daily as needed. de [place], Raymond J, MD  Active   warfarin (COUMADIN ) 10 MG tablet [id number] Yes TAKE 1 TABLET BY MOUTH DAILY AT 6:00 AM  Patient taking differently: Take 10 mg by mouth every evening.   Cloretta Arley NOVAK, MD  Active            Med Note RENNIS, DUROJAHYE' R   Mon Nov 12, 2024  2:43 AM)    warfarin (COUMADIN ) 2.5 MG tablet [id number] Yes TAKE 2.5 MG WITH 10 MG TABLET ON MONDAY & THURSDAY FOR DOSE OF 12.5 MG Sherrill, Gary B, MD  Active  Med Note MERNA DEVERE CROME   Tue Sep 25, 2024  2:30 PM) 12.5 mg M & Thursday  Med List Note Teretha Renaee SAILOR, RPH-CPP 11/05/24 1324): [place]  filled at [facility] (Specialty)            Home Care and Equipment/Supplies: Were Home Health Services Ordered?: Yes Name of Home Health Agency:: [facility] Has Agency set up a time to come to your home?: Yes First Home Health Visit Date: 11/16/24 Any new equipment or medical supplies ordered?: Yes Name of Medical  supply agency?: [employer] [facility] Were you able to get the equipment/medical supplies?: Yes Do you have any questions related to the use of the equipment/supplies?: No  Functional Questionnaire: Do you need assistance with bathing/showering or dressing?: No Do you need assistance with meal preparation?: No Do you need assistance with eating?: No Do you have difficulty maintaining continence: No Do you need assistance with getting out of bed/getting out of a chair/moving?: No Do you have difficulty managing or taking your medications?: No  Follow up appointments reviewed: PCP Follow-up appointment confirmed?: No (attempted to assist patient with scheduling hospital follow up with primary care provider. Availability not until middle of January. Advised patient to call provider office and attempt to schedule earlier appointment) Specialist Hospital Follow-up appointment confirmed?: Yes Date of Specialist follow-up appointment?: 12/03/24 Follow-Up Specialty Provider:: pulmonary/ Dr. Kassie,  on 11/20/24 has follow visit with Dr. Cloretta Do you need transportation to your follow-up appointment?: No Do you understand care options if your condition(s) worsen?: Yes-patient verbalized understanding  SDOH Interventions Today    Flowsheet Row Most Recent Value  SDOH Interventions   Food Insecurity Interventions Intervention Not Indicated  Housing Interventions Intervention Not Indicated  Transportation Interventions Intervention Not Indicated  Utilities Interventions Intervention Not Indicated   Discussed and offered 30 day TOC program.  Patient  verbally agreed to enrollment.   The patient has been provided with contact information for the care management team and has been advised to call with any health -related questions or concerns.  The patient verbalized understanding with current plan of care.  The patient is directed to their insurance card regarding availability of benefits coverage.    Arvin Seip RN, BSN, CCM [employer], Population Health Case Manager Phone: [phone]  "

## 2024-11-16 NOTE — Transitions of Care (Post Inpatient/ED Visit) (Signed)
" ° °  11/16/2024  Name: ADRIK KHIM MRN: [id number] DOB: 16-Dec-1945  Today's TOC FU Call Status: Today's TOC FU Call Status:: Unsuccessful Call (1st Attempt) Unsuccessful Call (1st Attempt) Date: 11/16/24  Attempted to reach the patient regarding the most recent Inpatient/ED visit.  Follow Up Plan: Additional outreach attempts will be made to reach the patient to complete the Transitions of Care (Post Inpatient/ED visit) call.   Arvin Seip RN, BSN, CCM [employer], Population Health Case Manager Phone: [phone]  "

## 2024-11-17 LAB — CULTURE, RESPIRATORY W GRAM STAIN: Culture: NO GROWTH

## 2024-11-20 ENCOUNTER — Inpatient Hospital Stay: Payer: MEDICARE

## 2024-11-20 ENCOUNTER — Encounter: Payer: Self-pay | Admitting: Nurse Practitioner

## 2024-11-20 ENCOUNTER — Encounter (HOSPITAL_COMMUNITY): Payer: Self-pay | Admitting: Student

## 2024-11-20 ENCOUNTER — Other Ambulatory Visit: Payer: Self-pay

## 2024-11-20 ENCOUNTER — Inpatient Hospital Stay (HOSPITAL_COMMUNITY)
Admission: AD | Admit: 2024-11-20 | Discharge: 2024-11-23 | Disposition: A | Discharge status: Home Health | Payer: MEDICARE | Source: Ambulatory Visit | Attending: Internal Medicine | Admitting: Internal Medicine

## 2024-11-20 ENCOUNTER — Inpatient Hospital Stay (HOSPITAL_COMMUNITY): Payer: MEDICARE

## 2024-11-20 ENCOUNTER — Inpatient Hospital Stay: Payer: MEDICARE | Admitting: Nurse Practitioner

## 2024-11-20 ENCOUNTER — Encounter (HOSPITAL_COMMUNITY): Payer: Self-pay

## 2024-11-20 VITALS — BP 119/63 | HR 86 | Temp 98.1°F | Resp 18 | Ht 71.0 in | Wt 176.4 lb

## 2024-11-20 DIAGNOSIS — Z79899 Other long term (current) drug therapy: Secondary | ICD-10-CM | POA: Diagnosis not present

## 2024-11-20 DIAGNOSIS — D72820 Lymphocytosis (symptomatic): Secondary | ICD-10-CM

## 2024-11-20 DIAGNOSIS — D696 Thrombocytopenia, unspecified: Secondary | ICD-10-CM | POA: Diagnosis present

## 2024-11-20 DIAGNOSIS — N1831 Chronic kidney disease, stage 3a: Secondary | ICD-10-CM | POA: Diagnosis present

## 2024-11-20 DIAGNOSIS — Z832 Family history of diseases of the blood and blood-forming organs and certain disorders involving the immune mechanism: Secondary | ICD-10-CM | POA: Diagnosis not present

## 2024-11-20 DIAGNOSIS — N529 Male erectile dysfunction, unspecified: Secondary | ICD-10-CM | POA: Diagnosis present

## 2024-11-20 DIAGNOSIS — Z7901 Long term (current) use of anticoagulants: Secondary | ICD-10-CM | POA: Diagnosis not present

## 2024-11-20 DIAGNOSIS — E785 Hyperlipidemia, unspecified: Secondary | ICD-10-CM | POA: Diagnosis present

## 2024-11-20 DIAGNOSIS — Z8249 Family history of ischemic heart disease and other diseases of the circulatory system: Secondary | ICD-10-CM

## 2024-11-20 DIAGNOSIS — Z803 Family history of malignant neoplasm of breast: Secondary | ICD-10-CM | POA: Diagnosis not present

## 2024-11-20 DIAGNOSIS — Z823 Family history of stroke: Secondary | ICD-10-CM | POA: Diagnosis not present

## 2024-11-20 DIAGNOSIS — L0231 Cutaneous abscess of buttock: Principal | ICD-10-CM | POA: Diagnosis present

## 2024-11-20 DIAGNOSIS — I878 Other specified disorders of veins: Secondary | ICD-10-CM | POA: Diagnosis present

## 2024-11-20 DIAGNOSIS — Z86711 Personal history of pulmonary embolism: Secondary | ICD-10-CM

## 2024-11-20 DIAGNOSIS — D630 Anemia in neoplastic disease: Secondary | ICD-10-CM | POA: Diagnosis present

## 2024-11-20 DIAGNOSIS — C9110 Chronic lymphocytic leukemia of B-cell type not having achieved remission: Secondary | ICD-10-CM | POA: Diagnosis present

## 2024-11-20 DIAGNOSIS — L03317 Cellulitis of buttock: Principal | ICD-10-CM | POA: Diagnosis present

## 2024-11-20 DIAGNOSIS — Z66 Do not resuscitate: Secondary | ICD-10-CM | POA: Diagnosis present

## 2024-11-20 DIAGNOSIS — T45515A Adverse effect of anticoagulants, initial encounter: Secondary | ICD-10-CM | POA: Diagnosis present

## 2024-11-20 DIAGNOSIS — I96 Gangrene, not elsewhere classified: Secondary | ICD-10-CM | POA: Diagnosis present

## 2024-11-20 DIAGNOSIS — Z8261 Family history of arthritis: Secondary | ICD-10-CM | POA: Diagnosis not present

## 2024-11-20 DIAGNOSIS — Z86718 Personal history of other venous thrombosis and embolism: Secondary | ICD-10-CM | POA: Diagnosis not present

## 2024-11-20 DIAGNOSIS — Z85828 Personal history of other malignant neoplasm of skin: Secondary | ICD-10-CM

## 2024-11-20 DIAGNOSIS — G8929 Other chronic pain: Secondary | ICD-10-CM | POA: Diagnosis present

## 2024-11-20 DIAGNOSIS — I129 Hypertensive chronic kidney disease with stage 1 through stage 4 chronic kidney disease, or unspecified chronic kidney disease: Secondary | ICD-10-CM | POA: Diagnosis present

## 2024-11-20 DIAGNOSIS — L89153 Pressure ulcer of sacral region, stage 3: Secondary | ICD-10-CM | POA: Diagnosis present

## 2024-11-20 DIAGNOSIS — I10 Essential (primary) hypertension: Secondary | ICD-10-CM

## 2024-11-20 LAB — CMP (CANCER CENTER ONLY)
ALT: 8 U/L (ref 0–44)
AST: 15 U/L (ref 15–41)
Albumin: 4 g/dL (ref 3.5–5.0)
Alkaline Phosphatase: 43 U/L (ref 38–126)
Anion gap: 9 (ref 5–15)
BUN: 31 mg/dL — ABNORMAL HIGH (ref 8–23)
CO2: 26 mmol/L (ref 22–32)
Calcium: 10.2 mg/dL (ref 8.9–10.3)
Chloride: 101 mmol/L (ref 98–111)
Creatinine: 1.48 mg/dL — ABNORMAL HIGH (ref 0.61–1.24)
GFR, Estimated: 48 mL/min — ABNORMAL LOW
Glucose, Bld: 93 mg/dL (ref 70–99)
Potassium: 4.4 mmol/L (ref 3.5–5.1)
Sodium: 135 mmol/L (ref 135–145)
Total Bilirubin: 0.7 mg/dL (ref 0.0–1.2)
Total Protein: 9.2 g/dL — ABNORMAL HIGH (ref 6.5–8.1)

## 2024-11-20 LAB — CBC WITH DIFFERENTIAL (CANCER CENTER ONLY)
Abs Immature Granulocytes: 0.08 K/uL — ABNORMAL HIGH (ref 0.00–0.07)
Basophils Absolute: 0 K/uL (ref 0.0–0.1)
Basophils Relative: 0 %
Eosinophils Absolute: 0.3 K/uL (ref 0.0–0.5)
Eosinophils Relative: 1 %
HCT: 31.7 % — ABNORMAL LOW (ref 39.0–52.0)
Hemoglobin: 10.2 g/dL — ABNORMAL LOW (ref 13.0–17.0)
Immature Granulocytes: 0 %
Lymphocytes Relative: 90 %
Lymphs Abs: 45 K/uL — ABNORMAL HIGH (ref 0.7–4.0)
MCH: 31.7 pg (ref 26.0–34.0)
MCHC: 32.2 g/dL (ref 30.0–36.0)
MCV: 98.4 fL (ref 80.0–100.0)
Monocytes Absolute: 0.5 K/uL (ref 0.1–1.0)
Monocytes Relative: 1 %
Neutro Abs: 3.8 K/uL (ref 1.7–7.7)
Neutrophils Relative %: 8 %
Platelet Count: 118 K/uL — ABNORMAL LOW (ref 150–400)
RBC: 3.22 MIL/uL — ABNORMAL LOW (ref 4.22–5.81)
RDW: 15.6 % — ABNORMAL HIGH (ref 11.5–15.5)
Smear Review: NORMAL
WBC Count: 49.7 K/uL — ABNORMAL HIGH (ref 4.0–10.5)
nRBC: 0 % (ref 0.0–0.2)

## 2024-11-20 LAB — PROTIME-INR
INR: 1.3 — ABNORMAL HIGH (ref 0.8–1.2)
Prothrombin Time: 17.1 s — ABNORMAL HIGH (ref 11.4–15.2)

## 2024-11-20 MED ORDER — IOHEXOL 300 MG/ML  SOLN
80.0000 mL | Freq: Once | INTRAMUSCULAR | Status: AC | PRN
  Administered 2024-11-20: 80 mL via INTRAVENOUS

## 2024-11-20 MED ORDER — ONDANSETRON HCL 4 MG/2ML IJ SOLN: 4.0000 mg | Freq: Four times a day (QID) | INTRAMUSCULAR | Status: DC | PRN

## 2024-11-20 MED ORDER — SODIUM CHLORIDE 0.9 % IV BOLUS
1000.0000 mL | Freq: Once | INTRAVENOUS | Status: AC
  Administered 2024-11-20: 1000 mL via INTRAVENOUS

## 2024-11-20 MED ORDER — ACETAMINOPHEN 650 MG RE SUPP: 650.0000 mg | Freq: Four times a day (QID) | RECTAL | Status: DC | PRN

## 2024-11-20 MED ORDER — OXYCODONE-ACETAMINOPHEN 5-325 MG PO TABS
1.0000 | ORAL_TABLET | Freq: Four times a day (QID) | ORAL | Status: DC | PRN
  Administered 2024-11-21: 1 via ORAL
  Filled 2024-11-20: qty 1

## 2024-11-20 MED ORDER — ACETAMINOPHEN 325 MG PO TABS
650.0000 mg | ORAL_TABLET | Freq: Four times a day (QID) | ORAL | Status: DC | PRN
  Administered 2024-11-20 – 2024-11-21 (×2): 650 mg via ORAL
  Filled 2024-11-20 (×2): qty 2

## 2024-11-20 MED ORDER — SENNOSIDES-DOCUSATE SODIUM 8.6-50 MG PO TABS: 1.0000 | ORAL_TABLET | Freq: Every evening | ORAL | Status: DC | PRN

## 2024-11-20 MED ORDER — VANCOMYCIN HCL 1250 MG/250ML IV SOLN
1250.0000 mg | Freq: Every day | INTRAVENOUS | Status: DC
  Administered 2024-11-20 – 2024-11-22 (×3): 1250 mg via INTRAVENOUS
  Filled 2024-11-20 (×4): qty 250

## 2024-11-20 MED ORDER — SODIUM CHLORIDE 0.9 % IV SOLN
2.0000 g | Freq: Two times a day (BID) | INTRAVENOUS | Status: DC
  Administered 2024-11-20 – 2024-11-23 (×6): 2 g via INTRAVENOUS
  Filled 2024-11-20 (×6): qty 12.5

## 2024-11-20 MED ORDER — PRAVASTATIN SODIUM 20 MG PO TABS
40.0000 mg | ORAL_TABLET | Freq: Every day | ORAL | Status: DC
  Administered 2024-11-21 – 2024-11-23 (×3): 40 mg via ORAL
  Filled 2024-11-20 (×3): qty 2

## 2024-11-20 MED ORDER — ONDANSETRON HCL 4 MG PO TABS: 4.0000 mg | ORAL_TABLET | Freq: Four times a day (QID) | ORAL | Status: DC | PRN

## 2024-11-20 MED ORDER — METRONIDAZOLE 500 MG/100ML IV SOLN
500.0000 mg | Freq: Two times a day (BID) | INTRAVENOUS | Status: DC
  Administered 2024-11-20 – 2024-11-23 (×6): 500 mg via INTRAVENOUS
  Filled 2024-11-20 (×6): qty 100

## 2024-11-20 MED ORDER — BISACODYL 5 MG PO TBEC: 5.0000 mg | DELAYED_RELEASE_TABLET | Freq: Every day | ORAL | Status: DC | PRN

## 2024-11-20 NOTE — H&P (Addendum)
 " History and Physical  Chad Gross FMW:[id number] DOB: 12-29-1945 DOA: 11/20/2024  PCP: de [place], Raymond J, MD   Chief Complaint: Right buttocks pain, fever  HPI: Chad Gross is a 78 y.o. 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  Patient reports after his recent hospitalization for pneumonia and E. coli bacteremia, he was feeling well.  Over the last 3 to 4 days, he has noticed a painful and hard cyst on his right buttocks.  Yesterday, he had a fever of up to 100.4.  He has been taking Augmentin  since discharge with the last dose due today.  He endorses chronic abdominal pain but denies any nausea, vomiting, headache, dizziness, shortness of breath, bloody stools, dysuria or change in appetite.  Initial vitals on admission showed Tmax 99, SBP 110-120s. Labs from oncology today shows creatinine 1.48, WBC 49.7, Hgb 10.2, platelet 118, PT/INR 17.1/1.3  Review of Systems: Please see HPI for pertinent positives and negatives. A complete 10 system review of systems are otherwise negative.  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   Clotting disorder 11/29/62   Have had three episodes of DVTs   ED (erectile dysfunction)    History of DVT (deep vein thrombosis)    Hyperlipidemia    Hypertension    Pericarditis    Pulmonary embolism [facility])    Past Surgical History:  Procedure Laterality Date   CARDIAC CATHETERIZATION  01/21/2011   EF 50-55%   GREENFIELD FLITER PLACEMENT     INGUINAL HERNIA REPAIR     BILATERAL   KNEE  ARTHROSCOPY Right 05/04/2018   Procedure: ARTHROSCOPY KNEE, PARTIAL MENISCECTOMY AND CHONDROPLASTY;  Surgeon: Sheril Coy, MD;  Location: [facility] OR;  Service: Orthopedics;  Laterality: Right;   Social History:  reports that he has never smoked. He has never been exposed to tobacco smoke. He has never used smokeless tobacco. He reports current alcohol use. He reports that he does not use drugs.  Allergies[1]  Family History  Problem Relation Age of Onset   Breast cancer Mother    Hypertension Mother    Arthritis Mother    Cancer Mother    Heart failure Father    Pneumonia Father    Hypertension Father    Stroke Maternal Uncle      Prior to Admission medications  Medication Sig Start Date End Date Taking? Authorizing Provider  acalabrutinib  maleate (CALQUENCE ) 100 MG tablet Take 1 tablet (100 mg total) by mouth 2 (two) times daily. 10/31/24   Cloretta Arley NOVAK, MD  acetaminophen  (TYLENOL ) 500 MG tablet Take 500-1,000 mg by mouth every 6 (six) hours as needed for moderate pain or headache.    [provider]  amoxicillin -clavulanate (AUGMENTIN ) 875-125 MG tablet Take 1 tablet by mouth every 12 (twelve) hours. 11/15/24   Rashid, Farhan, MD  Coenzyme Q10 (COQ10) 100 MG CAPS Take 100 mg by mouth daily.    [provider]  GLUCOSAMINE CHONDROITIN COMPLX PO Take 1 tablet by mouth daily.    [provider]  hydrochlorothiazide  (HYDRODIURIL ) 12.5 MG tablet TAKE 1 TABLET BY MOUTH EVERY DAY 12/08/23   de [place], Quintin PARAS, MD  losartan  (COZAAR ) 50 MG tablet TAKE 1 TABLET BY MOUTH EVERY DAY 11/06/24   de [place], Quintin PARAS, MD  meclizine  (ANTIVERT ) 12.5 MG tablet Take 1 tablet (12.5 mg total) by mouth 3 (three) times daily as needed for dizziness. 07/03/24   Cloretta Arley NOVAK, MD  pravastatin  (PRAVACHOL ) 40 MG tablet TAKE 1 TABLET BY MOUTH EVERY DAY 10/30/24   de [place], Quintin PARAS, MD  traMADol  (ULTRAM ) 50 MG tablet Take 1 tablet (50 mg total) by mouth every 6 (six) hours as needed for  moderate pain or severe pain. 06/01/23   de [place], Raymond J, MD  triamcinolone  acetonide 40 MG/ML SUSP 40 mg, mupirocin cream 2 % CREA 15 g Apply 1 application  topically as needed (Face).    [provider]  triamcinolone  cream (KENALOG ) 0.1 % Apply 1 Application topically 2 (two) times daily as needed. 06/08/24   de [place], Quintin PARAS, MD  warfarin (COUMADIN ) 10 MG tablet TAKE 1 TABLET BY MOUTH DAILY AT 6:00 AM Patient taking differently: Take 10 mg by mouth every evening. 06/19/24   Cloretta Arley NOVAK, MD  warfarin (COUMADIN ) 2.5 MG tablet TAKE 2.5 MG WITH 10 MG TABLET ON MONDAY & THURSDAY FOR DOSE OF 12.5 MG 08/14/24   Cloretta Arley NOVAK, MD    Physical Exam: BP (!) 124/58 (BP Location: Right Arm)   Pulse 80   Temp 99 F (37.2 C) (Oral)   Resp 20   SpO2 99%  General: Pleasant, well-appearing elderly man laying in bed. No acute distress. HEENT: [place]/AT. Anicteric sclera CV: RRR. No murmurs, rubs, or gallops. No LE edema Pulmonary: Lungs CTAB. Normal effort. No wheezing or rales. Abdominal: Soft, mild distention. Mild tenderness to palpation of the mid abdomen. Normal bowel sounds. Extremities: Palpable radial and DP pulses. Normal ROM. Skin: Warm and dry. Approximately 9 cm tender, indurated area with fluctuance and surrounding erythema at the right buttocks. (See media tab)) Neuro: A&Ox3. Moves all extremities. Normal sensation to light touch. No focal deficit. Psych: Normal mood and affect          Labs on Admission:  Basic Metabolic Panel: Recent Labs  Lab 11/20/24 1258  NA 135  K 4.4  CL 101  CO2 26  GLUCOSE 93  BUN 31*  CREATININE 1.48*  CALCIUM 10.2   Liver Function Tests: Recent Labs  Lab 11/20/24 1258  AST 15  ALT 8  ALKPHOS 43  BILITOT 0.7  PROT 9.2*  ALBUMIN 4.0   No results for input(s): LIPASE, AMYLASE in the last 168 hours. No results for input(s): AMMONIA in the last 168 hours. CBC: Recent Labs  Lab 11/14/24 0551 11/15/24 0520 11/20/24 1258   WBC 30.8* 34.7* 49.7*  NEUTROABS  --   --  3.8  HGB 9.2* 9.5* 10.2*  HCT 27.3* 28.5* 31.7*  MCV 95.1 96.3 98.4  PLT 58* 69* 118*   Cardiac Enzymes: No results for input(s): CKTOTAL, CKMB, CKMBINDEX, TROPONINI in the last 168 hours. BNP (last 3 results) No results for input(s): BNP in the last 8760 hours.  ProBNP (last 3 results) No results for input(s): PROBNP in the last 8760 hours.  CBG: No results for input(s): GLUCAP in the last 168 hours.  Radiological Exams on Admission: No results found. Assessment/Plan Chad Gross is a 78 y.o. 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   # Right buttocks abscess - Patient with a history of CLL presented with 3-4 days of right buttocks pain, swelling and redness but associated fevers - On exam, there is an indurated, fluctuance, tender and erythematous area on his right buttocks concerning for abscess - Patient directly admitted from oncology office for further evaluation and surgical consult for I&D - Will obtain a CT pelvis and blood cultures - I have discuss case with Dr. Tanda, general surgeon, he recommends keeping n.p.o. at midnight, repeating INR and they will see patient in the morning. - Start IV vancomycin , cefepime  and flagyl  to cover possible MRSA and polymicrobial infection (immunocompromise, recent hospitalization) - Percocet as needed for pain - Trend CBC, fever curve  # HTN - BP stable with SBP in the 110s to 120s - Currently normotensive, continue to hold BP meds  # CKD 3A - Creatinine of 1.48 around baseline of 1.3 to 1.5 - Give IV NS 1 L bolus prior to CT  scan - Trend renal function, avoid nephrotoxic agents  # CLL - Followed by Dr. Cloretta at the cancer center, started on Acalabrutinib  earlier this month - Oncology following, Acalabrutinib  being put on hold due to increased risks of bleeding with surgical procedures  # Leukocytosis - WBC 49.7 today, significantly elevated from 35 at recent hospital discharge - Likely a combination of acute infection and progressive CLL - Trend CBC  # Chronic thrombocytopenia and anemia - Secondary to CLL - Hgb stable at 10.2, platelet improved to 118  # Chronic DVT # History of PE - On indefinite in anticoagulation with Coumadin  due to recurrent DVTs - INR 1.3 today, will hold warfarin due to anticipated surgical intervention - SCDs for DVT prophylaxis  # HLD - Continue pravastatin   DVT prophylaxis: SCDs    Code Status: Limited: Do not attempt resuscitation (DNR) -DNR-LIMITED -Do Not Intubate/DNI   Consults called: Oncology  Family Communication: Discussed plan for admission with girlfriend at bedside  Severity of Illness: The appropriate patient status for this patient is INPATIENT. Inpatient status is judged to be reasonable and necessary in order to provide the required intensity of service to ensure the patient's safety. The patient's presenting symptoms, physical exam findings, and initial radiographic and laboratory data in the context of their chronic comorbidities is felt to place them at high risk for further clinical deterioration. Furthermore, it is not anticipated that the patient will be medically stable for discharge from the hospital within 2 midnights of admission.   * I certify that at the point of admission it is my clinical judgment that the patient will require inpatient hospital care spanning beyond 2 midnights from the point of admission due to high intensity of service, high risk for further deterioration and high frequency of surveillance required.*  Level of care:  Med-Surg   I personally spent a total of 75 minutes in the care of the patient today including preparing to see the patient, getting/reviewing separately obtained history, performing a medically appropriate exam/evaluation, placing orders, referring and communicating with other health care professionals, and documenting clinical information in the EHR.   Lou Claretta HERO, MD 11/20/2024, 5:20 PM [facility] Pager: [phone] Isaiah 41:10   If 7PM-7AM, please contact night-coverage [email or web address] Password [email or web address]     [1] No Known Allergies  "

## 2024-11-20 NOTE — Progress Notes (Signed)
 Pharmacy Antibiotic Note  Chad Gross is a 78 y.o. male admitted on 11/20/2024 with cellulitis.  Pharmacy has been consulted for vancomycin  and cefepime  dosing.  Plan: Vancomycin  1250 mg IV q24 hr (est AUC 542 based on SCr 1.48; Vd 0.72) Measure vancomycin  AUC at steady state as indicated SCr q48 while on vanc Cefepime  2 g IV q12 hr; future adjustments per standing pharmacy protocol Flagyl  500 mg IV q12 hr per MD; dosing appropriate  Height: 5' 11 (180.3 cm) Weight: 80 kg (176 lb 5.9 oz) IBW/kg (Calculated) : 75.3  Temp (24hrs), Avg:98.6 F (37 C), Min:98.1 F (36.7 C), Max:99 F (37.2 C)  Recent Labs  Lab 11/14/24 0551 11/15/24 0520 11/20/24 1258  WBC 30.8* 34.7* 49.7*  CREATININE  --   --  1.48*    Estimated Creatinine Clearance: 43.1 mL/min (A) (by C-G formula based on SCr of 1.48 mg/dL (H)).    Allergies[1]  Antimicrobials this admission: 12/23 vancomycin  >>  12/23 cefepime  >>  12/23 Flagyl  >>   Dose adjustments this admission: N/a  Microbiology results: 12/23 BCx: sent   Thank you for allowing pharmacy to be a part of this patients care.  Horrace Hanak A 11/20/2024 8:10 PM     [1] No Known Allergies

## 2024-11-20 NOTE — Progress Notes (Signed)
 " [facility] OFFICE PROGRESS NOTE   Diagnosis: CLL  INTERVAL HISTORY:   Chad Gross returns for follow-up.  He began a trial of acalabrutinib  11/07/2024.  He was hospitalized 11/11/2024 through 11/15/2024 with pneumonia, E. coli bacteremia.  A blood culture returned positive for E. coli.  He received Rocephin  in the hospital and was discharged home on Augmentin .  He continues to feel weak.  Some shortness of breath yesterday, better today.  Persistent cough.  No arthralgias.  He denies bleeding.  No arthralgias.  Overall good appetite.  No vomiting.  He felt warm yesterday and checked his temperature, returned at 100.7.  Some chills.  He notes a cyst on the right buttock for the past week, describes as painful and hard.  Objective:  Vital signs in last 24 hours:  Blood pressure 119/63, pulse 86, temperature 98.1 F (36.7 C), resp. rate 18, height 5' 11 (1.803 m), weight 176 lb 6.4 oz (80 kg), SpO2 100%.    HEENT: No thrush or ulcers. Resp: Lungs clear bilaterally. Cardio: Regular rate and rhythm. GI: Masslike fullness mid abdomen appears smaller, associated tenderness. Vascular: No leg edema. Neuro: Alert and oriented. Skin: There is an approximate 9 to 10 cm indurated tender bruised appearing lesion at the right buttock with extensive surrounding erythema tracking to the perineum. Lymph nodes: 1 cm mobile bilateral axillary nodes   Lab Results:  Lab Results  Component Value Date   WBC 49.7 (H) 11/20/2024   HGB 10.2 (L) 11/20/2024   HCT 31.7 (L) 11/20/2024   MCV 98.4 11/20/2024   PLT 118 (L) 11/20/2024   NEUTROABS 3.8 11/20/2024    Imaging:  No results found.  Medications: I have reviewed the patient's current medications.  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 He will return for a monthly PT check.  Indwelling IVC catheter.  Chronic stasis change of the low legs, on the  right greater than left.        4. 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 erectile dysfunction-followed by Dr. Watt , status post placement of a penile implant at [facility] in April 2014            7. CT of the abdomen/pelvis at [facility] urology February 2014 for evaluation of hematuria-12 mm external iliac nodes and left periaortic retroperitoneal lymph node-11 mm-potentially related to CLL      8.  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, peripheral lymphocytosis and small palpable lymph nodes          CT abdomen/pelvis 09/18/2019-extensive abdominal pelvic lymphadenopathy including a left periaortic node measuring 5.7 cm 08/13/2020 CLL FISH panel-no evidence of trisomy 12; no evidence of [id number]; no evidence of p53 deletion or amplification; deletion of ATM present 11/05/2020-peripheral blood TP53 mutation-negative Cycle 1 bendamustine /Rituxan  10/08/2020, 10/09/2020 Cycle 2 bendamustine /Rituxan  11/05/2020, 11/06/2020 Cycle 3 bendamustine /Rituxan  12/03/2020, 12/04/2020 Cycle 4 bendamustine /Rituxan  12/31/2020, 01/01/2021 CTs 01/19/2021-decrease in bulky abdomen/pelvic lymphadenopathy with more widespread ill-defined haziness in the mesentery and retroperitoneal fat, decreased pelvic lymphadenopathy Cycle 5 bendamustine /Rituxan  01/28/2021, 01/29/2021 Cycle 6 bendamustine /rituximab  02/25/2021, 02/26/2021 CT 02/22/2023-mild generalized lymphadenopathy in the neck, significant increase in chest andabdominal/pelvic lymph nodes, dominant nodal mass in the mesentery CT 06/25/2023-progressive lymphadenopathy in the abdomen and pelvis, dominant confluent mass in the upper abdomen/retroperitoneum has enlarged, stable small nodes in the chest Cycle 1 Bendamustine /Rituxan  07/07/2023, 07/08/2023 Cycle 2 Bendamustine /Rituxan  08/08/2023, 08/09/2023 CT  abdomen/pelvis 08/30/2023-bulky matted lymphadenopathy and soft tissue mass encasing  retroperitoneal structures and extending into the small bowel mesentery slightly diminished in size.  Additional matted gastrohepatic ligament, portacaval, porta hepatis, bilateral iliac and inguinal lymph nodes also slightly diminished in size.  Spleen measures smaller. Cycle 3 Bendamustine /Rituxan  09/05/2023, 09/06/2023 Cycle 4 Bendamustine /Rituxan  10/03/2023, 10/04/2023 10/23/2024 CTs: New moderate right pleural effusion, progressive mediastinal and axillary nodes, confluent abdominal/retroperitoneal adenopathy with a mass at the level of the kidneys measuring 19.1 cm, marked bilateral pelvic sidewall adenopathy Trial of acalabrutinib  11/07/2024, stopped 11/11/2024 when hospitalized, resumed 11/15/2024     9.  Basal cell carcinoma removed from the left superior central forehead 01/21/2020    10.  Renal insufficiency    11.  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  Indeterminate left hepatic lobe lesion on CT 02/22/2023 13.  Fever and altered mental status following cycle 2 Cemiplimab -no source for infection identified 14.  Hypercalcemia-Zometa  10/30/2024 15.  Admission 11/11/2024 through 11/15/2024 with pneumonia, E. 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  He is currently on acalabrutinib .  He was hospitalized 11/11/2024 through 11/15/2024 with pneumonia, E. coli bacteremia.  He is currently  completing a course of Augmentin .  He presents today for routine follow-up.  He had a fever and chills yesterday.  On exam he has a large indurated lesion at the right buttock, question abscess, question infected hematoma.  Due to his immunocompromised status we are recommending hospital admission for evaluation.  Acalabrutinib  and Coumadin  will be held.  There is an increased risk of bleeding with  acalabrutinib .  We will discuss duration of therapy interruption before and after an I&D procedure with the [facility] pharmacist.  Patient seen with Dr. Cloretta.   Olam Ned ANP/GNP-BC   11/20/2024  2:04 PM This was a shared visit with Olam Ned.  Chad Gross is interviewed and examined.  He was discharged from the hospital 11/15/2024 after admission with pneumonia and E. coli bacteremia.  He complains of discomfort at the right buttock for the past week.  He has an erythematous/purpuric indurated lesion occupying the lower aspect of the right gluteus extending to the outer perineum.  We suspect this represents an abscess.  It is possible that this was the source of the E. coli bacteremia last week.  I contacted the hospitalist service to arrange for hospital admission.  I recommend CT imaging and drainage as indicated.  He is maintained on acalabrutinib  for CLL.  He has been on a acalabrutinib  for approximately the past 2 weeks, last taken this morning.  This can increase bleeding with surgical procedures.  Acalabrutinib  will be placed on hold.  Chad Gross is maintained on chronic Coumadin  anticoagulation for recurrent venous thrombosis.  The PT/INR is subtherapeutic today.  Coumadin  will be placed on hold.  Arvella Cloretta, MD       "

## 2024-11-21 ENCOUNTER — Other Ambulatory Visit: Payer: Self-pay

## 2024-11-21 DIAGNOSIS — L0231 Cutaneous abscess of buttock: Secondary | ICD-10-CM

## 2024-11-21 LAB — BASIC METABOLIC PANEL WITH GFR
Anion gap: 6 (ref 5–15)
BUN: 31 mg/dL — ABNORMAL HIGH (ref 8–23)
CO2: 23 mmol/L (ref 22–32)
Calcium: 8.4 mg/dL — ABNORMAL LOW (ref 8.9–10.3)
Chloride: 105 mmol/L (ref 98–111)
Creatinine, Ser: 1.36 mg/dL — ABNORMAL HIGH (ref 0.61–1.24)
GFR, Estimated: 53 mL/min — ABNORMAL LOW
Glucose, Bld: 89 mg/dL (ref 70–99)
Potassium: 4.4 mmol/L (ref 3.5–5.1)
Sodium: 134 mmol/L — ABNORMAL LOW (ref 135–145)

## 2024-11-21 LAB — CBC
HCT: 25 % — ABNORMAL LOW (ref 39.0–52.0)
Hemoglobin: 8.2 g/dL — ABNORMAL LOW (ref 13.0–17.0)
MCH: 32.2 pg (ref 26.0–34.0)
MCHC: 32.8 g/dL (ref 30.0–36.0)
MCV: 98 fL (ref 80.0–100.0)
Platelets: 91 K/uL — ABNORMAL LOW (ref 150–400)
RBC: 2.55 MIL/uL — ABNORMAL LOW (ref 4.22–5.81)
RDW: 15.6 % — ABNORMAL HIGH (ref 11.5–15.5)
WBC: 34.6 K/uL — ABNORMAL HIGH (ref 4.0–10.5)
nRBC: 0 % (ref 0.0–0.2)

## 2024-11-21 LAB — PROTIME-INR
INR: 1.5 — ABNORMAL HIGH (ref 0.8–1.2)
Prothrombin Time: 19.3 s — ABNORMAL HIGH (ref 11.4–15.2)

## 2024-11-21 MED ORDER — HEPARIN BOLUS VIA INFUSION
2000.0000 [IU] | Freq: Once | INTRAVENOUS | Status: AC
  Administered 2024-11-21: 2000 [IU] via INTRAVENOUS
  Filled 2024-11-21: qty 2000

## 2024-11-21 MED ORDER — HEPARIN (PORCINE) 25000 UT/250ML-% IV SOLN
1350.0000 [IU]/h | INTRAVENOUS | Status: DC
  Administered 2024-11-21: 1100 [IU]/h via INTRAVENOUS
  Administered 2024-11-22 – 2024-11-23 (×2): 1350 [IU]/h via INTRAVENOUS
  Filled 2024-11-21 (×3): qty 250

## 2024-11-21 NOTE — Progress Notes (Addendum)
 IP PROGRESS NOTE  Subjective:   Chad Gross continues to have discomfort at the right gluteal region.  Objective: Vital signs in last 24 hours: Blood pressure (!) 102/54, pulse 71, temperature 98.7 F (37.1 C), temperature source Oral, resp. rate 18, height 5' 11 (1.803 m), weight 176 lb 5.9 oz (80 kg), SpO2 90%.  Intake/Output from previous day: 12/23 0701 - 12/24 0700 In: 1930.1 [P.O.:480; IV Piggyback:1450.1] Out: 200 [Urine:200]  Physical Exam:  HEENT: No thrush Lungs: Clear bilaterally Cardiac: Regular rate and rhythm Abdomen: Firm mass in the mid abdomen centered to the left of midline Musculoskeletal: Erythema at the medial lower right gluteal region with a 3 cm central deep erythematous/necrotic appearing area  Lab Results: Recent Labs    11/20/24 1258 11/21/24 0606  WBC 49.7* 34.6*  HGB 10.2* 8.2*  HCT 31.7* 25.0*  PLT 118* 91*    BMET Recent Labs    11/20/24 1258 11/21/24 0606  NA 135 134*  K 4.4 4.4  CL 101 105  CO2 26 23  GLUCOSE 93 89  BUN 31* 31*  CREATININE 1.48* 1.36*  CALCIUM 10.2 8.4*    No results found for: CEA1, CEA, CAN199, CA125  Studies/Results: CT PELVIS W CONTRAST Result Date: 11/20/2024 EXAM: CT PELVIS, WITH IV CONTRAST 11/20/2024 07:48:44 PM TECHNIQUE: Axial images were acquired through the pelvis with IV contrast. Reformatted images were reviewed. Automated exposure control, iterative reconstruction, and/or weight based adjustment of the mA/kV was utilized to reduce the radiation dose to as low as reasonably achievable. COMPARISON: None available. CLINICAL HISTORY: Perianal abscess or fistula suspected; Right buttocks abscess. FINDINGS: BONES: No acute fracture or focal osseous lesion. JOINTS: No dislocation. The joint spaces are normal. SOFT TISSUES: Soft tissue thickening of the medial right buttock with associated inflammatory stranding extending into the perineum. No drainable fluid collection or abscess. No definite  fistula. INTRAPELVIC CONTENTS: Confluent adenopathy in the abdomen / pelvis with extensive iliac chain adenopathy. Penile prosthesis. IMPRESSION: 1. Right buttock cellulitis without abscess. 2. Confluent adenopathy in the abdomen/pelvis. Electronically signed by: Norman Gatlin MD 11/20/2024 08:49 PM EST RP Workstation: [id number]    Medications: I have reviewed the patient's current medications.  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 He will return for a monthly PT check.  Indwelling IVC catheter.  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        4. 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 erectile dysfunction-followed by Dr. Watt , status post placement of a penile implant at [facility] in April 2014            7. CT of the abdomen/pelvis at [facility] urology February 2014 for evaluation of hematuria-12 mm external iliac nodes and left periaortic retroperitoneal lymph node-11 mm-potentially related to CLL      8.  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, peripheral lymphocytosis and small palpable lymph nodes          CT abdomen/pelvis 09/18/2019-extensive abdominal pelvic lymphadenopathy including a left periaortic node measuring 5.7 cm 08/13/2020 CLL FISH panel-no evidence of trisomy 12; no evidence of [id number]; no evidence of p53 deletion or amplification; deletion of ATM present 11/05/2020-peripheral blood TP53 mutation-negative Cycle 1 bendamustine /Rituxan  10/08/2020, 10/09/2020 Cycle 2 bendamustine /Rituxan  11/05/2020, 11/06/2020 Cycle 3 bendamustine /Rituxan  12/03/2020, 12/04/2020 Cycle 4 bendamustine /Rituxan  12/31/2020, 01/01/2021 CTs 01/19/2021-decrease in bulky abdomen/pelvic lymphadenopathy with more widespread  ill-defined haziness in the mesentery and retroperitoneal fat, decreased pelvic  lymphadenopathy Cycle 5 bendamustine /Rituxan  01/28/2021, 01/29/2021 Cycle 6 bendamustine /rituximab  02/25/2021, 02/26/2021 CT 02/22/2023-mild generalized lymphadenopathy in the neck, significant increase in chest andabdominal/pelvic lymph nodes, dominant nodal mass in the mesentery CT 06/25/2023-progressive lymphadenopathy in the abdomen and pelvis, dominant confluent mass in the upper abdomen/retroperitoneum has enlarged, stable small nodes in the chest Cycle 1 Bendamustine /Rituxan  07/07/2023, 07/08/2023 Cycle 2 Bendamustine /Rituxan  08/08/2023, 08/09/2023 CT abdomen/pelvis 08/30/2023-bulky matted lymphadenopathy and soft tissue mass encasing retroperitoneal structures and extending into the small bowel mesentery slightly diminished in size.  Additional matted gastrohepatic ligament, portacaval, porta hepatis, bilateral iliac and inguinal lymph nodes also slightly diminished in size.  Spleen measures smaller. Cycle 3 Bendamustine /Rituxan  09/05/2023, 09/06/2023 Cycle 4 Bendamustine /Rituxan  10/03/2023, 10/04/2023 10/23/2024 CTs: New moderate right pleural effusion, progressive mediastinal and axillary nodes, confluent abdominal/retroperitoneal adenopathy with a mass at the level of the kidneys measuring 19.1 cm, marked bilateral pelvic sidewall adenopathy Trial of acalabrutinib  11/07/2024, stopped 11/11/2024 when hospitalized, resumed 11/15/2024     9.  Basal cell carcinoma removed from the left superior central forehead 01/21/2020    10.  Renal insufficiency    11.  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  Indeterminate left hepatic lobe lesion on CT 02/22/2023 13.  Fever and altered mental status following cycle 2 Cemiplimab -no source for infection identified 14.  Hypercalcemia-Zometa  10/30/2024 15.  Admission 11/11/2024 through 11/15/2024 with pneumonia, E. coli bacteremia 16.  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  He began treatment with acalabrutinib  on 11/07/2024.  The palpable abdominal mass appears smaller.  He otherwise appears stable from a hematologic standpoint.  He was admitted last week with pneumonia and E. coli bacteremia.  He was admitted yesterday with erythema/pain at the right gluteal region with a report of fever at home.  The etiology of the inflammatory process of the right gluteus is unclear.  He most likely has cellulitis.  The differential diagnosis includes warfarin skin necrosis.  I reviewed PT results from last week and the last few days.  The PT/INR has been subtherapeutic, but has not decreased in the normal range.  He is on antibiotics and followed by the surgical service.  I doubt the process is related to warfarin skin necrosis, but this is possible.  This is a typical distribution for skin necrosis.  This would be unusual as he was not off of Coumadin  for a significant amount of time this month and the PT/INR has been lower in the past.   Recommendations: 1.  Antibiotics, consider infectious disease consult 2.  Consider aspiration of the central necrotic appearing area of the gluteal skin  change 3.  Biopsy of the right gluteal lesion to rule out warfarin skin necrosis if the area is not improved tomorrow 4.  Hold acalabrutinib   5.  Heparin  dosing per pharmacy 6.  Please call oncology as needed.  I will check on him 11/23/2024.       LOS: 1 day   Arley Hof, MD   11/21/2024, 7:46 AM

## 2024-11-21 NOTE — Plan of Care (Signed)
" °  Problem: Education: Goal: Knowledge of General Education information will improve Description: Including pain rating scale, medication(s)/side effects and non-pharmacologic comfort measures Outcome: Progressing   Problem: Health Behavior/Discharge Planning: Goal: Ability to manage health-related needs will improve Outcome: Progressing   Problem: Clinical Measurements: Goal: Respiratory complications will improve Outcome: Progressing Goal: Cardiovascular complication will be avoided Outcome: Progressing   Problem: Activity: Goal: Risk for activity intolerance will decrease Outcome: Progressing   Problem: Nutrition: Goal: Adequate nutrition will be maintained Outcome: Progressing   Problem: Elimination: Goal: Will not experience complications related to urinary retention Outcome: Progressing   Problem: Pain Managment: Goal: General experience of comfort will improve and/or be controlled Outcome: Progressing   Problem: Safety: Goal: Ability to remain free from injury will improve Outcome: Progressing   "

## 2024-11-21 NOTE — Consult Note (Signed)
 Reason for Consult: Buttock redness Referring Physician: Dr. Donnamarie Carlin Chad Gross is an 78 y.o. male.  HPI: The patient is a 78 year old white male who has chronic lymphocytic leukemia.  He was recently in the hospital with a pneumonia.  While he was in the hospital he did not move around very much.  Since returning home he has needed a little bit of assistance.  He has tried to get up as much as possible.  Over the last week or so he has developed a tender area on his right buttock.  He denies any fevers or chills.  He denies any nausea or vomiting.  His white count is chronically elevated and he says that the white count normally runs in the 40s  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   Clotting disorder 11/29/62   Have had three episodes of DVTs   ED (erectile dysfunction)    History of DVT (deep vein thrombosis)    Hyperlipidemia    Hypertension    Pericarditis    Pulmonary embolism [facility])     Past Surgical History:  Procedure Laterality Date   CARDIAC CATHETERIZATION  01/21/2011   EF 50-55%   GREEN[place] FLITER PLACEMENT     INGUINAL HERNIA REPAIR     BILATERAL   KNEE ARTHROSCOPY Right 05/04/2018   Procedure: ARTHROSCOPY KNEE, PARTIAL MENISCECTOMY AND CHONDROPLASTY;  Surgeon: Sheril Coy, MD;  Location: [facility] OR;  Service: Orthopedics;  Laterality: Right;    Family History  Problem Relation Age of Onset   Breast cancer Mother    Hypertension Mother    Arthritis Mother    Cancer Mother    Heart failure Father    Pneumonia Father    Hypertension Father    Stroke Maternal Uncle     Social History:  reports that he has never smoked. He has never been exposed to tobacco smoke. He has never used smokeless tobacco. He reports current alcohol use. He reports that he does not use drugs.  Allergies: Allergies[1]  Medications: I have reviewed  the patient's current medications.  Results for orders placed or performed during the hospital encounter of 11/20/24 (from the past 48 hours)  Culture, blood (Routine X 2) w Reflex to ID Panel     Status: None (Preliminary result)   Collection Time: 11/20/24  7:30 PM   Specimen: BLOOD LEFT FOREARM  Result Value Ref Range   Specimen Description      BLOOD LEFT FOREARM Performed at [facility], [address], [place], [place] [address]    Special Requests      BOTTLES DRAWN AEROBIC AND ANAEROBIC Blood Culture adequate volume Performed at [facility], [address], [place], [place] [address]    Culture PENDING    Report Status PENDING   Culture, blood (Routine X 2) w Reflex to ID Panel     Status: None (Preliminary result)   Collection Time: 11/20/24  7:33 PM   Specimen: BLOOD LEFT HAND  Result Value Ref Range   Specimen Description      BLOOD LEFT HAND Performed at [facility], [address] [place], [place] [address]    Special Requests      BOTTLES DRAWN AEROBIC AND ANAEROBIC Blood Culture adequate volume Performed at [facility], [address] [place], [place] [address]  Culture PENDING    Report Status PENDING   Basic metabolic panel     Status: Abnormal   Collection Time: 11/21/24  6:06 AM  Result Value Ref Range   Sodium 134 (L) 135 - 145 mmol/L   Potassium 4.4 3.5 - 5.1 mmol/L   Chloride 105 98 - 111 mmol/L   CO2 23 22 - 32 mmol/L   Glucose, Bld 89 70 - 99 mg/dL    Comment: Glucose reference range applies only to samples taken after fasting for at least 8 hours.   BUN 31 (H) 8 - 23 mg/dL   Creatinine, Ser 8.63 (H) 0.61 - 1.24 mg/dL   Calcium 8.4 (L) 8.9 - 10.3 mg/dL   GFR, Estimated 53 (L) >60 mL/min    Comment: (NOTE) Calculated using the CKD-EPI Creatinine Equation (2021)    Anion gap 6 5 - 15    Comment: Performed at [facility], [address], [place], [place] [address]  CBC      Status: Abnormal   Collection Time: 11/21/24  6:06 AM  Result Value Ref Range   WBC 34.6 (H) 4.0 - 10.5 K/uL   RBC 2.55 (L) 4.22 - 5.81 MIL/uL   Hemoglobin 8.2 (L) 13.0 - 17.0 g/dL   HCT 74.9 (L) 60.9 - 47.9 %   MCV 98.0 80.0 - 100.0 fL   MCH 32.2 26.0 - 34.0 pg   MCHC 32.8 30.0 - 36.0 g/dL   RDW 84.3 (H) 88.4 - 84.4 %   Platelets 91 (L) 150 - 400 K/uL    Comment: SPECIMEN CHECKED FOR CLOTS CONSISTENT WITH PREVIOUS RESULT Immature Platelet Fraction may be clinically indicated, consider ordering this additional test [id number]    nRBC 0.0 0.0 - 0.2 %    Comment: Performed at [facility], [address] [place], [place] [address]  Protime-INR     Status: Abnormal   Collection Time: 11/21/24  6:06 AM  Result Value Ref Range   Prothrombin Time 19.3 (H) 11.4 - 15.2 seconds   INR 1.5 (H) 0.8 - 1.2    Comment: (NOTE) INR goal varies based on device and disease states. Performed at [facility], [address] [place], [place] [address]    *Note: Due to a large number of results and/or encounters for the requested time period, some results have not been displayed. A complete set of results can be found in Results Review.    CT PELVIS W CONTRAST Result Date: 11/20/2024 EXAM: CT PELVIS, WITH IV CONTRAST 11/20/2024 07:48:44 PM TECHNIQUE: Axial images were acquired through the pelvis with IV contrast. Reformatted images were reviewed. Automated exposure control, iterative reconstruction, and/or weight based adjustment of the mA/kV was utilized to reduce the radiation dose to as low as reasonably achievable. COMPARISON: None available. CLINICAL HISTORY: Perianal abscess or fistula suspected; Right buttocks abscess. FINDINGS: BONES: No acute fracture or focal osseous lesion. JOINTS: No dislocation. The joint spaces are normal. SOFT TISSUES: Soft tissue thickening of the medial right buttock with associated inflammatory stranding extending into the  perineum. No drainable fluid collection or abscess. No definite fistula. INTRAPELVIC CONTENTS: Confluent adenopathy in the abdomen / pelvis with extensive iliac chain adenopathy. Penile prosthesis. IMPRESSION: 1. Right buttock cellulitis without abscess. 2. Confluent adenopathy in the abdomen/pelvis. Electronically signed by: Norman Gatlin MD 11/20/2024 08:49 PM EST RP Workstation: [id number]    Review of Systems  Constitutional: Negative.   HENT: Negative.    Eyes: Negative.   Respiratory: Negative.  Cardiovascular: Negative.   Gastrointestinal:  Positive for rectal pain.  Endocrine: Negative.   Genitourinary: Negative.   Musculoskeletal: Negative.   Skin: Negative.   Allergic/Immunologic: Negative.   Neurological: Negative.   Hematological: Negative.   Psychiatric/Behavioral: Negative.     Blood pressure (!) 102/54, pulse 71, temperature 98.7 F (37.1 C), temperature source Oral, resp. rate 18, height 5' 11 (1.803 m), weight 80 kg, SpO2 90%. Physical Exam Vitals reviewed.  Constitutional:      General: He is not in acute distress.    Appearance: Normal appearance.  HENT:     Head: Normocephalic and atraumatic.     Right Ear: External ear normal.     Left Ear: External ear normal.     Nose: Nose normal.     Mouth/Throat:     Mouth: Mucous membranes are moist.     Pharynx: Oropharynx is clear.  Eyes:     Extraocular Movements: Extraocular movements intact.     Conjunctiva/sclera: Conjunctivae normal.     Pupils: Pupils are equal, round, and reactive to light.  Cardiovascular:     Rate and Rhythm: Normal rate and regular rhythm.     Pulses: Normal pulses.     Heart sounds: Normal heart sounds.  Pulmonary:     Effort: Pulmonary effort is normal. No respiratory distress.     Breath sounds: Normal breath sounds.  Abdominal:     General: Abdomen is flat. Bowel sounds are normal.     Palpations: Abdomen is soft.     Comments: There is mild to moderate tenderness  throughout the abdomen which is chronic from the CLL  Musculoskeletal:        General: No swelling or deformity. Normal range of motion.     Cervical back: Normal range of motion and neck supple.  Skin:    General: Skin is warm and dry.     Coloration: Skin is not jaundiced.     Comments: There is an area of redness on the right buttock with a central area of darkened skin.  There is no fluctuance.  Neurological:     General: No focal deficit present.     Mental Status: He is alert and oriented to person, place, and time.  Psychiatric:        Mood and Affect: Mood normal.        Behavior: Behavior normal.     Assessment/Plan: The patient has an area of redness with some central darkening on the right buttock.  The CT scan showed inflammatory change but no fluid collection or abscess.  This does have an appearance similar to what we would see with a pressure ulcer.  At this point I would recommend treatment with IV antibiotics.  It is difficult to follow his white count because of his CLL.  He should try to avoid any pressure to that area so he needs to change positions frequently and move around is much as possible.  We will follow him closely with you.  At this point there is no indication for surgical debridement.  Chad Gross 11/21/2024, 7:33 AM         [1] No Known Allergies

## 2024-11-21 NOTE — Plan of Care (Signed)
  Problem: Education: Goal: Knowledge of General Education information will improve Description: Including pain rating scale, medication(s)/side effects and non-pharmacologic comfort measures Outcome: Progressing   Problem: Clinical Measurements: Goal: Ability to maintain clinical measurements within normal limits will improve Outcome: Progressing   Problem: Activity: Goal: Risk for activity intolerance will decrease Outcome: Progressing   Problem: Pain Managment: Goal: General experience of comfort will improve and/or be controlled Outcome: Progressing   Problem: Safety: Goal: Ability to remain free from injury will improve Outcome: Progressing   Problem: Skin Integrity: Goal: Risk for impaired skin integrity will decrease Outcome: Progressing

## 2024-11-21 NOTE — Progress Notes (Signed)
 PHARMACY - ANTICOAGULATION CONSULT NOTE  Pharmacy Consult for IV heparin  Indication: Hx DVT/PE, r/o warfarin-induced necrosis  Allergies[1]  Patient Measurements: Height: 5' 11 (180.3 cm) Weight: 80 kg (176 lb 5.9 oz) IBW/kg (Calculated) : 75.3 HEPARIN  DW (KG): 80  Vital Signs: Temp: 98 F (36.7 C) (12/24 1346) Temp Source: Oral (12/24 1346) BP: 104/55 (12/24 1346) Pulse Rate: 73 (12/24 1346)  Labs: Recent Labs    11/20/24 1258 11/21/24 0606  HGB 10.2* 8.2*  HCT 31.7* 25.0*  PLT 118* 91*  LABPROT 17.1* 19.3*  INR 1.3* 1.5*  CREATININE 1.48* 1.36*    Estimated Creatinine Clearance: 46.9 mL/min (A) (by C-G formula based on SCr of 1.36 mg/dL (H)).   Medical History: 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   Clotting disorder 11/29/62   Have had three episodes of DVTs   ED (erectile dysfunction)    History of DVT (deep vein thrombosis)    Hyperlipidemia    Hypertension    Pericarditis    Pulmonary embolism (HCC)     Medications:  Medications Prior to Admission  Medication Sig Dispense Refill Last Dose/Taking   acalabrutinib  maleate (CALQUENCE ) 100 MG tablet Take 1 tablet (100 mg total) by mouth 2 (two) times daily. 60 tablet 0    acetaminophen  (TYLENOL ) 500 MG tablet Take 500-1,000 mg by mouth every 6 (six) hours as needed for moderate pain or headache.      amoxicillin -clavulanate (AUGMENTIN ) 875-125 MG tablet Take 1 tablet by mouth every 12 (twelve) hours. 10 tablet 0    Coenzyme Q10 (COQ10) 100 MG CAPS Take 100 mg by mouth daily.      GLUCOSAMINE CHONDROITIN COMPLX PO Take 1 tablet by mouth daily.      hydrochlorothiazide  (HYDRODIURIL ) 12.5 MG tablet TAKE 1 TABLET BY MOUTH EVERY DAY 90 tablet 3    losartan  (COZAAR ) 50 MG tablet TAKE 1 TABLET BY MOUTH EVERY DAY 90 tablet 3    meclizine  (ANTIVERT ) 12.5 MG tablet Take 1 tablet  (12.5 mg total) by mouth 3 (three) times daily as needed for dizziness. 60 tablet 0    pravastatin  (PRAVACHOL ) 40 MG tablet TAKE 1 TABLET BY MOUTH EVERY DAY 90 tablet 3    traMADol  (ULTRAM ) 50 MG tablet Take 1 tablet (50 mg total) by mouth every 6 (six) hours as needed for moderate pain or severe pain. 30 tablet 0    triamcinolone  acetonide 40 MG/ML SUSP 40 mg, mupirocin cream 2 % CREA 15 g Apply 1 application  topically as needed (Face).      triamcinolone  cream (KENALOG ) 0.1 % Apply 1 Application topically 2 (two) times daily as needed. 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 Warfarin originally held d/t concern for  possible procedures needed, but Surgery feels wound is more likely pressure-related, and no immediate plans for surgery. On the remote chance that lesion is warfarin-induced tissue necrosis, Onc recommends anticoagulation with heparin  at this time, with Pharmacy to dose.  Baseline INR subtherapeutic Baseline Plt looks to be ~80-90k Prior anticoagulation: warfarin 12.5 mg on Mon & Thurs, 10 mg all other days Last dose 12/22 PM (presumably 12.5 mg)  Significant events:  Today, 11/21/2024: CBC: Hgb low on admission, and slightly lower today (possibly dilutional); Plt low but consistent w/ baseline SCr looks to be at or even below baseline (~1.5) No bleeding or  infusion issues per nursing  Goal of Therapy: Heparin  level 0.3-0.7 units/ml Monitor platelets by anticoagulation protocol: Yes  Plan: Heparin  2000 units IV bolus x 1 Heparin  1100 units/hr IV infusion Check heparin  level 8 hrs after start Daily CBC, daily heparin  level once stable Monitor for signs of bleeding or thrombosis  Bard Jeans, PharmD, BCPS [id number] 11/21/2024, 3:28 PM    [1] No Known Allergies

## 2024-11-21 NOTE — Progress Notes (Signed)
**Note Chad-Identified via Obfuscation**  "  PROGRESS NOTE  Chad Gross FMW:[id number] DOB: [birth year] DOA: 11/20/2024 PCP: Chad Cuba, Raymond J, MD  HPI/Recap of past 24 hours: Chad Gross is a 78 y.o. 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  Patient reports after his recent hospitalization for pneumonia and E. coli bacteremia, he was feeling well.  Over the last 3 to 4 days PTA, he has noticed a painful and hard cyst on his right buttocks, reported low grade fever. Patient has been taking Augmentin  since discharge with the last dose on 12/23. Initial vitals on admission showed Tmax 99, SBP 110-120s. Labs from oncology office shows creatinine 1.48, WBC 49.7, Hgb 10.2, platelet 118, PT/INR 17.1/1.3.  General surgery consulted.  Patient admitted for further management.    Today, patient denies any new complaints, no fever noted, WBC chronically elevated due to CLL.  Still with pain around his right buttocks area, denies it worsening.    Assessment/Plan: Principal Problem:   Abscess of right buttock Active Problems:   Essential hypertension   Thrombocytopenia   History of DVT (deep vein thrombosis)   Lymphocytosis   Right buttock abscess ??Warfarin skin necrosis Currently afebrile, with chronic leukocytosis due to CLL Noted indurated, fluctuance, tender and erythematous area on his right buttocks concerning for abscess CT pelvis noted right buttock cellulitis without abscess BC x 2 pending General Surgery on board, currently no indication for surgical debridement, will follow Oncology on board, appreciate recs Continue IV vancomycin , cefepime  and flagyl  to cover  possible MRSA Consider biopsy of the right gluteal lesion to rule out warfarin skin necrosis if the area is not improved in the next day Daily CBC, monitor closely  Chronic thrombocytopenia and anemia Secondary to CLL Daily CBC   HTN BP stable Hold HCTZ, losartan    CKD 3A Daily BMP   CLL Followed by Dr. Cloretta at the cancer center, started on Acalabrutinib  earlier this month Oncology following, continue to hold Acalabrutinib   Chronic DVT History of PE On indefinite anticoagulation with Coumadin  due to recurrent DVTs Continue to hold warfarin as per oncology, start IV heparin  as per oncology   HLD Continue pravastatin    Wound 11/12/24 1652 Pressure Injury Coccyx Medial Stage 3 -  Full thickness tissue loss. Subcutaneous fat may be visible but bone, tendon or muscle are NOT exposed. (Active)       Estimated body mass index is 24.6 kg/m as calculated from the following:   Height as of this encounter: 5' 11 (1.803 m).   Weight as of this encounter: 80 kg.     Code Status: Full  Family Communication: None at bedside  Disposition Plan: Status is: Inpatient Remains inpatient appropriate because: Level of care      Consultants: General surgery Oncology  Procedures: None  Antimicrobials: Vancomycin , cefepime , Flagyl   DVT prophylaxis: IV heparin    Objective: Vitals:   11/20/24 1821 11/20/24 2014 11/21/24 0446 11/21/24 1346  BP:  (!) 98/51 (!) 102/54 (!) 104/55  Pulse:  74 71 73  Resp:  18 18 20   Temp:  98.5 F (36.9 C) 98.7 F (37.1 C) 98 F (36.7 C)  TempSrc:  Oral Oral Oral  SpO2:  94% 90% 91%  Weight: 80 kg     Height: 5' 11 (1.803 m)  Intake/Output Summary (Last 24 hours) at 11/21/2024 1647 Last data filed at 11/21/2024 1610 Gross per 24 hour  Intake 2402.84 ml  Output 200 ml  Net 2202.84 ml   Filed Weights   11/20/24 1821  Weight: 80 kg    Exam: General: NAD  Cardiovascular: S1, S2 present Respiratory: CTAB Abdomen:  Soft, nontender, nondistended, bowel sounds present Musculoskeletal: No bilateral pedal edema noted Skin: Noted erythema around right gluteal region, with noted central necrotic appearing area Psychiatry: Normal mood     Data Reviewed: CBC: Recent Labs  Lab 11/15/24 0520 11/20/24 1258 11/21/24 0606  WBC 34.7* 49.7* 34.6*  NEUTROABS  --  3.8  --   HGB 9.5* 10.2* 8.2*  HCT 28.5* 31.7* 25.0*  MCV 96.3 98.4 98.0  PLT 69* 118* 91*   Basic Metabolic Panel: Recent Labs  Lab 11/20/24 1258 11/21/24 0606  NA 135 134*  K 4.4 4.4  CL 101 105  CO2 26 23  GLUCOSE 93 89  BUN 31* 31*  CREATININE 1.48* 1.36*  CALCIUM 10.2 8.4*   GFR: Estimated Creatinine Clearance: 46.9 mL/min (A) (by C-G formula based on SCr of 1.36 mg/dL (H)). Liver Function Tests: Recent Labs  Lab 11/20/24 1258  AST 15  ALT 8  ALKPHOS 43  BILITOT 0.7  PROT 9.2*  ALBUMIN 4.0   No results for input(s): LIPASE, AMYLASE in the last 168 hours. No results for input(s): AMMONIA in the last 168 hours. Coagulation Profile: Recent Labs  Lab 11/15/24 0520 11/20/24 1258 11/21/24 0606  INR 1.4* 1.3* 1.5*   Cardiac Enzymes: No results for input(s): CKTOTAL, CKMB, CKMBINDEX, TROPONINI in the last 168 hours. BNP (last 3 results) No results for input(s): PROBNP in the last 8760 hours. HbA1C: No results for input(s): HGBA1C in the last 72 hours. CBG: No results for input(s): GLUCAP in the last 168 hours. Lipid Profile: No results for input(s): CHOL, HDL, LDLCALC, TRIG, CHOLHDL, LDLDIRECT in the last 72 hours. Thyroid  Function Tests: No results for input(s): TSH, T4TOTAL, FREET4, T3FREE, THYROIDAB in the last 72 hours. Anemia Panel: No results for input(s): VITAMINB12, FOLATE, FERRITIN, TIBC, IRON, RETICCTPCT in the last 72 hours. Urine analysis:    Component Value Date/Time   COLORURINE YELLOW 11/11/2024 1837   APPEARANCEUR HAZY (A) 11/11/2024 1837    LABSPEC 1.014 11/11/2024 1837   PHURINE 5.0 11/11/2024 1837   GLUCOSEU NEGATIVE 11/11/2024 1837   GLUCOSEU NEGATIVE 02/20/2020 1431   HGBUR NEGATIVE 11/11/2024 1837   BILIRUBINUR NEGATIVE 11/11/2024 1837   KETONESUR NEGATIVE 11/11/2024 1837   PROTEINUR 30 (A) 11/11/2024 1837   UROBILINOGEN 0.2 02/20/2020 1431   NITRITE NEGATIVE 11/11/2024 1837   LEUKOCYTESUR NEGATIVE 11/11/2024 1837   Sepsis Labs: @LABRCNTIP (procalcitonin:4,lacticidven:4)  ) Recent Results (from the past 240 hours)  Culture, blood (Routine x 2)     Status: Abnormal   Collection Time: 11/11/24  6:03 PM   Specimen: BLOOD  Result Value Ref Range Status   Specimen Description   Final    BLOOD LEFT ANTECUBITAL Performed at [facility], [address], [place], [place] [address]    Special Requests   Final    BOTTLES DRAWN AEROBIC AND ANAEROBIC Blood Culture adequate volume Performed at [facility], [address] [place], [place] [address]    Culture  Setup Time   Final    GRAM NEGATIVE RODS IN BOTH AEROBIC AND ANAEROBIC BOTTLES CRITICAL RESULT CALLED TO, READ BACK BY AND VERIFIED WITH: PHARMD MICHELLE BELL ON 11/12/24 @  1943 BY DRT Performed at [facility], [address] [place], [place] [address]    Culture ESCHERICHIA COLI (A)  Final   Report Status 11/14/2024 FINAL  Final   Organism ID, Bacteria ESCHERICHIA COLI  Final      Susceptibility   Escherichia coli - MIC*    AMPICILLIN <=2 SENSITIVE Sensitive     CEFAZOLIN  (NON-URINE) <=1 SENSITIVE Sensitive     CEFEPIME  <=0.12 SENSITIVE Sensitive     ERTAPENEM <=0.12 SENSITIVE Sensitive     CEFTRIAXONE  <=0.25 SENSITIVE Sensitive     CIPROFLOXACIN >=4 RESISTANT Resistant     GENTAMICIN <=1 SENSITIVE Sensitive     MEROPENEM <=0.25 SENSITIVE Sensitive     TRIMETH/SULFA >=320 RESISTANT Resistant     AMPICILLIN/SULBACTAM <=2 SENSITIVE Sensitive     PIP/TAZO Value in next row Sensitive      <=4 SENSITIVEThis is a  modified FDA-approved test that has been validated and its performance characteristics determined by the reporting laboratory.  This laboratory is certified under the Clinical Laboratory Improvement Amendments CLIA as qualified to perform high complexity clinical laboratory testing.    * ESCHERICHIA COLI  Blood Culture ID Panel (Reflexed)     Status: Abnormal   Collection Time: 11/11/24  6:03 PM  Result Value Ref Range Status   Enterococcus faecalis NOT DETECTED NOT DETECTED Final   Enterococcus Faecium NOT DETECTED NOT DETECTED Final   Listeria monocytogenes NOT DETECTED NOT DETECTED Final   Staphylococcus species NOT DETECTED NOT DETECTED Final   Staphylococcus aureus (BCID) NOT DETECTED NOT DETECTED Final   Staphylococcus epidermidis NOT DETECTED NOT DETECTED Final   Staphylococcus lugdunensis NOT DETECTED NOT DETECTED Final   Streptococcus species NOT DETECTED NOT DETECTED Final   Streptococcus agalactiae NOT DETECTED NOT DETECTED Final   Streptococcus pneumoniae NOT DETECTED NOT DETECTED Final   Streptococcus pyogenes NOT DETECTED NOT DETECTED Final   A.calcoaceticus-baumannii NOT DETECTED NOT DETECTED Final   Bacteroides fragilis NOT DETECTED NOT DETECTED Final   Enterobacterales DETECTED (A) NOT DETECTED Final    Comment: Enterobacterales represent a large order of gram negative bacteria, not a single organism. CRITICAL RESULT CALLED TO, READ BACK BY AND VERIFIED WITH: PHARMD MICHELLE BELL ON 11/12/24 @ 1943 BY DRT    Enterobacter cloacae complex NOT DETECTED NOT DETECTED Final   Escherichia coli DETECTED (A) NOT DETECTED Final    Comment: CRITICAL RESULT CALLED TO, READ BACK BY AND VERIFIED WITH: PHARMD MICHELLE BELL ON 11/12/24 @ 1943 BY DRT    Klebsiella aerogenes NOT DETECTED NOT DETECTED Final   Klebsiella oxytoca NOT DETECTED NOT DETECTED Final   Klebsiella pneumoniae NOT DETECTED NOT DETECTED Final   Proteus species NOT DETECTED NOT DETECTED Final   Salmonella species  NOT DETECTED NOT DETECTED Final   Serratia marcescens NOT DETECTED NOT DETECTED Final   Haemophilus influenzae NOT DETECTED NOT DETECTED Final   Neisseria meningitidis NOT DETECTED NOT DETECTED Final   Pseudomonas aeruginosa NOT DETECTED NOT DETECTED Final   Stenotrophomonas maltophilia NOT DETECTED NOT DETECTED Final   Candida albicans NOT DETECTED NOT DETECTED Final   Candida auris NOT DETECTED NOT DETECTED Final   Candida glabrata NOT DETECTED NOT DETECTED Final   Candida krusei NOT DETECTED NOT DETECTED Final   Candida parapsilosis NOT DETECTED NOT DETECTED Final   Candida tropicalis NOT DETECTED NOT DETECTED Final   Cryptococcus neoformans/gattii NOT DETECTED NOT DETECTED Final   CTX-M ESBL NOT DETECTED NOT DETECTED Final   Carbapenem resistance IMP NOT DETECTED NOT DETECTED  Final   Carbapenem resistance KPC NOT DETECTED NOT DETECTED Final   Carbapenem resistance NDM NOT DETECTED NOT DETECTED Final   Carbapenem resist OXA 48 LIKE NOT DETECTED NOT DETECTED Final   Carbapenem resistance VIM NOT DETECTED NOT DETECTED Final    Comment: Performed at [facility], [address] [place], [place] [address]  Culture, blood (Routine x 2)     Status: None   Collection Time: 11/11/24  8:25 PM   Specimen: BLOOD RIGHT FOREARM  Result Value Ref Range Status   Specimen Description   Final    BLOOD RIGHT FOREARM Performed at [facility], [address] [place], [place] [address]    Special Requests   Final    BOTTLES DRAWN AEROBIC AND ANAEROBIC Blood Culture results may not be optimal due to an inadequate volume of blood received in culture bottles Performed at [facility], [address], [place], [place] [address]    Culture   Final    NO GROWTH 5 DAYS Performed at [facility], [address] [place], [place] [address]    Report Status 11/16/2024 FINAL  Final  SARS Coronavirus 2 by RT PCR (hospital order, performed in [facility] hospital lab)  *cepheid single result test* Anterior Nasal Swab     Status: None   Collection Time: 11/11/24  8:41 PM   Specimen: Anterior Nasal Swab  Result Value Ref Range Status   SARS Coronavirus 2 by RT PCR NEGATIVE NEGATIVE Final    Comment: (NOTE) SARS-CoV-2 target nucleic acids are NOT DETECTED.  The SARS-CoV-2 RNA is generally detectable in upper and lower respiratory specimens during the acute phase of infection. The lowest concentration of SARS-CoV-2 viral copies this assay can detect is 250 copies / mL. A negative result does not preclude SARS-CoV-2 infection and should not be used as the sole basis for treatment or other patient management decisions.  A negative result may occur with improper specimen collection / handling, submission of specimen other than nasopharyngeal swab, presence of viral mutation(s) within the areas targeted by this assay, and inadequate number of viral copies (<250 copies / mL). A negative result must be combined with clinical observations, patient history, and epidemiological information.  Fact Sheet for Patients:   roadlaptop.co.za  Fact Sheet for Healthcare Providers: http://kim-miller.com/  This test is not yet approved or  cleared by the [place]  FDA and has been authorized for detection and/or diagnosis of SARS-CoV-2 by FDA under an Emergency Use Authorization (EUA).  This EUA will remain in effect (meaning this test can be used) for the duration of the COVID-19 declaration under Section 564(b)(1) of the Act, 21 U.S.C. section 360bbb-3(b)(1), unless the authorization is terminated or revoked sooner.  Performed at [facility], [address], [place], [place] [address]   MRSA Next Gen by PCR, Nasal     Status: None   Collection Time: 11/12/24 12:52 AM   Specimen: Nasal Mucosa; Nasal Swab  Result Value Ref Range Status   MRSA by PCR Next Gen NOT DETECTED NOT DETECTED Final     Comment: (NOTE) The GeneXpert MRSA Assay (FDA approved for NASAL specimens only), is one component of a comprehensive MRSA colonization surveillance program. It is not intended to diagnose MRSA infection nor to guide or monitor treatment for MRSA infections. Test performance is not FDA approved in patients less than 36 years old. Performed at [facility], [address] [place], [place]  [address]   Expectorated Sputum Assessment w Gram Stain, Rflx to Resp Cult     Status: None   Collection Time: 11/14/24  6:36 PM   Specimen: Expectorated Sputum  Result Value Ref Range Status   Specimen Description EXPECTORATED SPUTUM  Final   Special Requests Immunocompromised  Final   Sputum evaluation   Final    THIS SPECIMEN IS ACCEPTABLE FOR SPUTUM CULTURE Performed at [facility], [address], [place], [place] [address]    Report Status 11/14/2024 FINAL  Final  Culture, Respiratory w Gram Stain     Status: None   Collection Time: 11/14/24  6:36 PM  Result Value Ref Range Status   Specimen Description   Final    EXPECTORATED SPUTUM Performed at [facility], [address] [place], [place] [address]    Special Requests   Final    Immunocompromised Reflexed from [phone] Performed at [facility], [address] [place], [place] [address]    Gram Stain   Final    RARE WBC SEEN NO ORGANISMS SEEN Performed at [facility], [address], [place], [place] [address]    Culture RARE CANDIDA ALBICANS  Final   Report Status 11/17/2024 FINAL  Final  Culture, blood (Routine X 2) w Reflex to ID Panel     Status: None (Preliminary result)   Collection Time: 11/20/24  7:30 PM   Specimen: BLOOD LEFT FOREARM  Result Value Ref Range Status   Specimen Description   Final    BLOOD LEFT FOREARM Performed at [facility], [address] [place], [place] [address]    Special Requests   Final    BOTTLES DRAWN  AEROBIC AND ANAEROBIC Blood Culture adequate volume Performed at [facility], [address] [place], [place] [address]    Culture   Final    NO GROWTH < 12 HOURS Performed at [facility], [address] [place], [place] [address]    Report Status PENDING  Incomplete  Culture, blood (Routine X 2) w Reflex to ID Panel     Status: None (Preliminary result)   Collection Time: 11/20/24  7:33 PM   Specimen: BLOOD LEFT HAND  Result Value Ref Range Status   Specimen Description   Final    BLOOD LEFT HAND Performed at [facility], [address], [place], [place] [address]    Special Requests   Final    BOTTLES DRAWN AEROBIC AND ANAEROBIC Blood Culture adequate volume Performed at [facility], [address], [place], [place] [address]    Culture   Final    NO GROWTH < 12 HOURS Performed at [facility], [address] [place], [place] [address]    Report Status PENDING  Incomplete      Studies: CT PELVIS W CONTRAST Result Date: 11/20/2024 EXAM: CT PELVIS, WITH IV CONTRAST 11/20/2024 07:48:44 PM TECHNIQUE: Axial images were acquired through the pelvis with IV contrast. Reformatted images were reviewed. Automated exposure control, iterative reconstruction, and/or weight based adjustment of the mA/kV was utilized to reduce the radiation dose to as low as reasonably achievable. COMPARISON: None available. CLINICAL HISTORY: Perianal abscess or fistula suspected; Right buttocks abscess. FINDINGS: BONES: No acute fracture or focal osseous lesion. JOINTS: No dislocation. The joint spaces are normal. SOFT TISSUES: Soft tissue thickening of the medial right buttock with associated inflammatory stranding extending into the perineum. No drainable fluid  collection or abscess. No definite fistula. INTRAPELVIC CONTENTS: Confluent adenopathy in the abdomen / pelvis with extensive iliac chain adenopathy. Penile prosthesis. IMPRESSION: 1. Right  buttock cellulitis without abscess. 2. Confluent adenopathy in the abdomen/pelvis. Electronically signed by: Norman Gatlin MD 11/20/2024 08:49 PM EST RP Workstation: [id number]    Scheduled Meds:  pravastatin   40 mg Oral Daily    Continuous Infusions:  ceFEPime  (MAXIPIME ) IV Stopped (11/21/24 0954)   heparin  1,100 Units/hr (11/21/24 1610)   metronidazole  Stopped (11/21/24 1104)   vancomycin  Stopped (11/21/24 1139)     LOS: 1 day     Lebron JINNY Cage, MD [facility]  If 7PM-7AM, please contact night-coverage [email or web address] 11/21/2024, 4:47 PM    "

## 2024-11-22 DIAGNOSIS — L0231 Cutaneous abscess of buttock: Secondary | ICD-10-CM | POA: Diagnosis not present

## 2024-11-22 LAB — BASIC METABOLIC PANEL WITH GFR
Anion gap: 10 (ref 5–15)
BUN: 31 mg/dL — ABNORMAL HIGH (ref 8–23)
CO2: 21 mmol/L — ABNORMAL LOW (ref 22–32)
Calcium: 8.8 mg/dL — ABNORMAL LOW (ref 8.9–10.3)
Chloride: 104 mmol/L (ref 98–111)
Creatinine, Ser: 1.36 mg/dL — ABNORMAL HIGH (ref 0.61–1.24)
GFR, Estimated: 53 mL/min — ABNORMAL LOW
Glucose, Bld: 101 mg/dL — ABNORMAL HIGH (ref 70–99)
Potassium: 4.6 mmol/L (ref 3.5–5.1)
Sodium: 134 mmol/L — ABNORMAL LOW (ref 135–145)

## 2024-11-22 LAB — CBC
HCT: 25.9 % — ABNORMAL LOW (ref 39.0–52.0)
Hemoglobin: 8.4 g/dL — ABNORMAL LOW (ref 13.0–17.0)
MCH: 31.9 pg (ref 26.0–34.0)
MCHC: 32.4 g/dL (ref 30.0–36.0)
MCV: 98.5 fL (ref 80.0–100.0)
Platelets: 103 K/uL — ABNORMAL LOW (ref 150–400)
RBC: 2.63 MIL/uL — ABNORMAL LOW (ref 4.22–5.81)
RDW: 15.4 % (ref 11.5–15.5)
WBC: 42.3 K/uL — ABNORMAL HIGH (ref 4.0–10.5)
nRBC: 0 % (ref 0.0–0.2)

## 2024-11-22 LAB — HEPARIN LEVEL (UNFRACTIONATED)
Heparin Unfractionated: 0.15 [IU]/mL — ABNORMAL LOW (ref 0.30–0.70)
Heparin Unfractionated: 0.38 [IU]/mL (ref 0.30–0.70)
Heparin Unfractionated: 0.38 [IU]/mL (ref 0.30–0.70)

## 2024-11-22 MED ORDER — HEPARIN BOLUS VIA INFUSION
2000.0000 [IU] | Freq: Once | INTRAVENOUS | Status: AC
  Administered 2024-11-22: 2000 [IU] via INTRAVENOUS
  Filled 2024-11-22: qty 2000

## 2024-11-22 NOTE — Progress Notes (Addendum)
 PHARMACY - ANTICOAGULATION CONSULT NOTE  Pharmacy Consult for IV heparin  Indication: Hx DVT/PE, r/o warfarin-induced necrosis  Allergies[1]  Patient Measurements: Height: 5' 11 (180.3 cm) Weight: 80 kg (176 lb 5.9 oz) IBW/kg (Calculated) : 75.3 HEPARIN  DW (KG): 80  Vital Signs: Temp: 99.1 F (37.3 C) (12/24 1959) Temp Source: Oral (12/24 1959) BP: 106/61 (12/24 1959) Pulse Rate: 78 (12/24 1959)  Labs: Recent Labs    11/20/24 1258 11/21/24 0606 11/22/24 0022  HGB 10.2* 8.2* 8.4*  HCT 31.7* 25.0* 25.9*  PLT 118* 91* 103*  LABPROT 17.1* 19.3*  --   INR 1.3* 1.5*  --   HEPARINUNFRC  --   --  0.15*  CREATININE 1.48* 1.36*  --     Estimated Creatinine Clearance: 46.9 mL/min (A) (by C-G formula based on SCr of 1.36 mg/dL (H)).   Medical History: 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   Clotting disorder 11/29/62   Have had three episodes of DVTs   ED (erectile dysfunction)    History of DVT (deep vein thrombosis)    Hyperlipidemia    Hypertension    Pericarditis    Pulmonary embolism (HCC)     Medications:  Medications Prior to Admission  Medication Sig Dispense Refill Last Dose/Taking   acalabrutinib  maleate (CALQUENCE ) 100 MG tablet Take 1 tablet (100 mg total) by mouth 2 (two) times daily. 60 tablet 0 11/20/2024 Morning   acetaminophen  (TYLENOL ) 500 MG tablet Take 500-1,000 mg by mouth every 6 (six) hours as needed (for pain or headaches).   11/20/2024 Bedtime   amoxicillin -clavulanate (AUGMENTIN ) 875-125 MG tablet Take 1 tablet by mouth every 12 (twelve) hours. 10 tablet 0 11/20/2024 Morning   Coenzyme Q10 (COQ10) 100 MG CAPS Take 100 mg by mouth daily.   Past Week   Ensure Plus (ENSURE PLUS) LIQD Take 237 mLs by mouth See admin instructions. Drink 237 ml's of either STRAWBERRY or CHOCOLATE by mouth one to two times a day    Taking   GLUCOSAMINE CHONDROITIN COMPLX PO Take 1 tablet by mouth daily.   Past Week   hydrochlorothiazide  (HYDRODIURIL ) 12.5 MG tablet TAKE 1 TABLET BY MOUTH EVERY DAY 90 tablet 3 11/20/2024 Morning   losartan  (COZAAR ) 50 MG tablet TAKE 1 TABLET BY MOUTH EVERY DAY 90 tablet 3 11/20/2024 Morning   meclizine  (ANTIVERT ) 12.5 MG tablet Take 1 tablet (12.5 mg total) by mouth 3 (three) times daily as needed for dizziness. 60 tablet 0 Unknown   Multiple Vitamins-Minerals (MENS MULTIVITAMIN) TABS Take 1 tablet by mouth daily with supper.   11/19/2024   pravastatin  (PRAVACHOL ) 40 MG tablet TAKE 1 TABLET BY MOUTH EVERY DAY (Patient taking differently: Take 40 mg by mouth at bedtime.) 90 tablet 3 11/19/2024   traMADol  (ULTRAM ) 50 MG tablet Take 1 tablet (50 mg total) by mouth every 6 (six) hours as needed for moderate pain or severe pain. 30 tablet 0 Unknown   triamcinolone  acetonide 40 MG/ML SUSP 40 mg, mupirocin cream 2 % CREA 15 g Apply 1 application  topically as needed (for irritation- face).   Unknown   triamcinolone  cream (KENALOG ) 0.1 % Apply 1 Application topically 2 (two) times daily as needed. (Patient taking differently: Apply 1 Application topically 2 (two) times daily as needed (fotr itching).) 30 g 0 Unknown   warfarin (COUMADIN ) 10 MG tablet TAKE 1 TABLET BY MOUTH DAILY  AT 6:00 AM (Patient taking differently: Take 10 mg by mouth every evening.) 90 tablet 1 11/19/2024   warfarin (COUMADIN ) 2.5 MG tablet TAKE 2.5 MG WITH 10 MG TABLET ON MONDAY & THURSDAY FOR DOSE OF 12.5 MG (Patient taking differently: Take 2.5 mg by mouth See admin instructions. Take 2.5 mg by mouth in the evening on ONLY Mondays and Thursdays- in conjunction with one 10 mg tablet *equals 12.5 mg*) 24 tablet 3 11/19/2024   Scheduled:   pravastatin   40 mg Oral Daily   PRN: acetaminophen  **OR** acetaminophen , bisacodyl , ondansetron  **OR** ondansetron  (ZOFRAN ) IV, oxyCODONE -acetaminophen , senna-docusate  Assessment: 53 yoM with  PMH DVT/PE on chronic warfarin, CLL on active chemo, chronic thrombocytopenia, admitted for suspected cellulitis +/- abscess on R buttock. Warfarin originally held d/t concern for possible procedures needed, but Surgery feels wound is more likely pressure-related, and no immediate plans for surgery. On the remote chance that lesion is warfarin-induced tissue necrosis, Onc recommends anticoagulation with heparin  at this time, with Pharmacy to dose.  Baseline INR subtherapeutic Baseline Plt looks to be ~80-90k Prior anticoagulation: warfarin 12.5 mg on Mon & Thurs, 10 mg all other days Last dose 12/22 PM (presumably 12.5 mg)  Significant events:  Today, 11/22/2024: Heparin  level 0.15 subtherapeutic on 1100 units/hr Hgb 8.4, plts 103 No bleeding noted per RN  Goal of Therapy: Heparin  level 0.3-0.7 units/ml Monitor platelets by anticoagulation protocol: Yes  Plan: Heparin  2000 units IV bolus x 1 Increase heparin  infusion to 1350 units/hr Check heparin  level in 8 hours Daily CBC, daily heparin  level once stable Monitor for signs of bleeding or thrombosis  Leeroy Mace RPh 11/22/2024, 1:13 AM      [1] No Known Allergies

## 2024-11-22 NOTE — Progress Notes (Signed)
 "  PROGRESS NOTE  Chad Gross FMW:[id number] DOB: 08/04/1946 DOA: 11/20/2024 PCP: de [place], Raymond J, MD  HPI/Recap of past 24 hours: Chad Gross is a 78 y.o. 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  Patient reports after his recent hospitalization for pneumonia and E. coli bacteremia, he was feeling well.  Over the last 3 to 4 days PTA, he has noticed a painful and hard cyst on his right buttocks, reported low grade fever. Patient has been taking Augmentin  since discharge with the last dose on 12/23. Initial vitals on admission showed Tmax 99, SBP 110-120s. Labs from oncology office shows creatinine 1.48, WBC 49.7, Hgb 10.2, platelet 118, PT/INR 17.1/1.3.  General surgery consulted.  Patient admitted for further management.    Today, pt denies any new complaints, reports R buttock pain is improving.    Assessment/Plan: Principal Problem:   Abscess of right buttock Active Problems:   Essential hypertension   Thrombocytopenia   History of DVT (deep vein thrombosis)   Lymphocytosis   Right buttock abscess ??Warfarin skin necrosis Currently afebrile, with chronic leukocytosis due to CLL Noted indurated, fluctuance, tender and erythematous area on his right buttocks concerning for abscess CT pelvis noted right buttock cellulitis without abscess BC x 2 NGTD General Surgery on board, currently no indication for surgical debridement, will follow Oncology on board, appreciate recs Continue IV vancomycin , cefepime  and flagyl  to cover possible MRSA Consider biopsy of the right gluteal lesion to rule out warfarin skin necrosis if  the area is not improved in the next day Daily CBC, monitor closely  Chronic thrombocytopenia and anemia Secondary to CLL Daily CBC   HTN BP stable/soft Hold HCTZ, losartan    CKD 3A Daily BMP   CLL Followed by Dr. Cloretta at the cancer center, started on Acalabrutinib  earlier this month Oncology following, continue to hold Acalabrutinib   Chronic DVT History of PE On indefinite anticoagulation with Coumadin  due to recurrent DVTs Continue to hold warfarin as per oncology, start IV heparin  as per oncology   HLD Continue pravastatin    Wound 11/12/24 1652 Pressure Injury Coccyx Medial Stage 3 -  Full thickness tissue loss. Subcutaneous fat may be visible but bone, tendon or muscle are NOT exposed. (Active)       Estimated body mass index is 24.6 kg/m as calculated from the following:   Height as of this encounter: 5' 11 (1.803 m).   Weight as of this encounter: 80 kg.     Code Status: Full  Family Communication: Girlfriend at bedside  Disposition Plan: Status is: Inpatient Remains inpatient appropriate because: Level of care      Consultants: General surgery Oncology  Procedures: None  Antimicrobials: Vancomycin , cefepime , Flagyl   DVT prophylaxis: IV heparin    Objective: Vitals:   11/21/24 0446 11/21/24 1346 11/21/24 1959 11/22/24 0417  BP: (!) 102/54 (!) 104/55 106/61 110/61  Pulse: 71 73 78 75  Resp: 18 20 18 18   Temp: 98.7 F (37.1 C) 98 F (36.7 C) 99.1 F (37.3 C) 99.1 F (37.3 C)  TempSrc: Oral Oral Oral Oral  SpO2: 90% 91% 92% 94%  Weight:      Height:        Intake/Output Summary (Last 24 hours) at 11/22/2024 1357 Last data filed  at 11/22/2024 0700 Gross per 24 hour  Intake 260.38 ml  Output 250 ml  Net 10.38 ml   Filed Weights   11/20/24 1821  Weight: 80 kg    Exam: General: NAD  Cardiovascular: S1, S2 present Respiratory: CTAB Abdomen: Soft, nontender, nondistended, bowel sounds present Musculoskeletal: No  bilateral pedal edema noted Skin: Noted erythema around right gluteal region, with noted central necrotic appearing area Psychiatry: Normal mood     Data Reviewed: CBC: Recent Labs  Lab 11/20/24 1258 11/21/24 0606 11/22/24 0022  WBC 49.7* 34.6* 42.3*  NEUTROABS 3.8  --   --   HGB 10.2* 8.2* 8.4*  HCT 31.7* 25.0* 25.9*  MCV 98.4 98.0 98.5  PLT 118* 91* 103*   Basic Metabolic Panel: Recent Labs  Lab 11/20/24 1258 11/21/24 0606 11/22/24 0022  NA 135 134* 134*  K 4.4 4.4 4.6  CL 101 105 104  CO2 26 23 21*  GLUCOSE 93 89 101*  BUN 31* 31* 31*  CREATININE 1.48* 1.36* 1.36*  CALCIUM 10.2 8.4* 8.8*   GFR: Estimated Creatinine Clearance: 46.9 mL/min (A) (by C-G formula based on SCr of 1.36 mg/dL (H)). Liver Function Tests: Recent Labs  Lab 11/20/24 1258  AST 15  ALT 8  ALKPHOS 43  BILITOT 0.7  PROT 9.2*  ALBUMIN 4.0   No results for input(s): LIPASE, AMYLASE in the last 168 hours. No results for input(s): AMMONIA in the last 168 hours. Coagulation Profile: Recent Labs  Lab 11/20/24 1258 11/21/24 0606  INR 1.3* 1.5*   Cardiac Enzymes: No results for input(s): CKTOTAL, CKMB, CKMBINDEX, TROPONINI in the last 168 hours. BNP (last 3 results) No results for input(s): PROBNP in the last 8760 hours. HbA1C: No results for input(s): HGBA1C in the last 72 hours. CBG: No results for input(s): GLUCAP in the last 168 hours. Lipid Profile: No results for input(s): CHOL, HDL, LDLCALC, TRIG, CHOLHDL, LDLDIRECT in the last 72 hours. Thyroid  Function Tests: No results for input(s): TSH, T4TOTAL, FREET4, T3FREE, THYROIDAB in the last 72 hours. Anemia Panel: No results for input(s): VITAMINB12, FOLATE, FERRITIN, TIBC, IRON, RETICCTPCT in the last 72 hours. Urine analysis:    Component Value Date/Time   COLORURINE YELLOW 11/11/2024 1837   APPEARANCEUR HAZY (A) 11/11/2024 1837   LABSPEC 1.014 11/11/2024 1837   PHURINE  5.0 11/11/2024 1837   GLUCOSEU NEGATIVE 11/11/2024 1837   GLUCOSEU NEGATIVE 02/20/2020 1431   HGBUR NEGATIVE 11/11/2024 1837   BILIRUBINUR NEGATIVE 11/11/2024 1837   KETONESUR NEGATIVE 11/11/2024 1837   PROTEINUR 30 (A) 11/11/2024 1837   UROBILINOGEN 0.2 02/20/2020 1431   NITRITE NEGATIVE 11/11/2024 1837   LEUKOCYTESUR NEGATIVE 11/11/2024 1837   Sepsis Labs: @LABRCNTIP (procalcitonin:4,lacticidven:4)  ) Recent Results (from the past 240 hours)  Expectorated Sputum Assessment w Gram Stain, Rflx to Resp Cult     Status: None   Collection Time: 11/14/24  6:36 PM   Specimen: Expectorated Sputum  Result Value Ref Range Status   Specimen Description EXPECTORATED SPUTUM  Final   Special Requests Immunocompromised  Final   Sputum evaluation   Final    THIS SPECIMEN IS ACCEPTABLE FOR SPUTUM CULTURE Performed at [facility], [address], [place], [place] [address]    Report Status 11/14/2024 FINAL  Final  Culture, Respiratory w Gram Stain     Status: None   Collection Time: 11/14/24  6:36 PM  Result Value Ref Range Status   Specimen Description   Final    EXPECTORATED SPUTUM Performed at  [facility], [address], [place], [place] [address]    Special Requests   Final    Immunocompromised Reflexed from [id number] Performed at [facility], [address], [place], [place] [address]    Gram Stain   Final    RARE WBC SEEN NO ORGANISMS SEEN Performed at [facility], [address] [place], [place] [address]    Culture RARE CANDIDA ALBICANS  Final   Report Status 11/17/2024 FINAL  Final  Culture, blood (Routine X 2) w Reflex to ID Panel     Status: None (Preliminary result)   Collection Time: 11/20/24  7:30 PM   Specimen: BLOOD LEFT FOREARM  Result Value Ref Range Status   Specimen Description   Final    BLOOD LEFT FOREARM Performed at [facility], [address], [place], [place] [address]    Special  Requests   Final    BOTTLES DRAWN AEROBIC AND ANAEROBIC Blood Culture adequate volume Performed at [facility], [address], [place], [place] [address]    Culture   Final    NO GROWTH 2 DAYS Performed at [facility], [address] [place], [place] [address]    Report Status PENDING  Incomplete  Culture, blood (Routine X 2) w Reflex to ID Panel     Status: None (Preliminary result)   Collection Time: 11/20/24  7:33 PM   Specimen: BLOOD LEFT HAND  Result Value Ref Range Status   Specimen Description   Final    BLOOD LEFT HAND Performed at [facility], [address] [place], [place] [address]    Special Requests   Final    BOTTLES DRAWN AEROBIC AND ANAEROBIC Blood Culture adequate volume Performed at [facility], [address], [place], [place] [address]    Culture   Final    NO GROWTH 2 DAYS Performed at [facility], [address], [place], [place] [address]    Report Status PENDING  Incomplete      Studies: No results found.   Scheduled Meds:  pravastatin   40 mg Oral Daily    Continuous Infusions:  ceFEPime  (MAXIPIME ) IV 2 g (11/22/24 1029)   heparin  1,350 Units/hr (11/22/24 0934)   metronidazole  500 mg (11/22/24 1121)   vancomycin  1,250 mg (11/22/24 1302)     LOS: 2 days     Lebron JINNY Cage, MD [facility]  If 7PM-7AM, please contact night-coverage [email or web address] 11/22/2024, 1:57 PM    "

## 2024-11-22 NOTE — Progress Notes (Addendum)
 PHARMACY - ANTICOAGULATION CONSULT NOTE  Pharmacy Consult for IV heparin  Indication: Hx DVT/PE, r/o warfarin-induced necrosis  Allergies[1]  Patient Measurements: Height: 5' 11 (180.3 cm) Weight: 80 kg (176 lb 5.9 oz) IBW/kg (Calculated) : 75.3 HEPARIN  DW (KG): 80  Vital Signs: Temp: 99.1 F (37.3 C) (12/25 0417) Temp Source: Oral (12/25 0417) BP: 110/61 (12/25 0417) Pulse Rate: 75 (12/25 0417)  Labs: Recent Labs    11/20/24 1258 11/21/24 0606 11/22/24 0022  HGB 10.2* 8.2* 8.4*  HCT 31.7* 25.0* 25.9*  PLT 118* 91* 103*  LABPROT 17.1* 19.3*  --   INR 1.3* 1.5*  --   HEPARINUNFRC  --   --  0.15*  CREATININE 1.48* 1.36* 1.36*    Estimated Creatinine Clearance: 46.9 mL/min (A) (by C-G formula based on SCr of 1.36 mg/dL (H)).   Medical History: 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   Clotting disorder 11/29/62   Have had three episodes of DVTs   ED (erectile dysfunction)    History of DVT (deep vein thrombosis)    Hyperlipidemia    Hypertension    Pericarditis    Pulmonary embolism (HCC)     Medications:  Medications Prior to Admission  Medication Sig Dispense Refill Last Dose/Taking   acalabrutinib  maleate (CALQUENCE ) 100 MG tablet Take 1 tablet (100 mg total) by mouth 2 (two) times daily. 60 tablet 0 11/20/2024 Morning   acetaminophen  (TYLENOL ) 500 MG tablet Take 500-1,000 mg by mouth every 6 (six) hours as needed (for pain or headaches).   11/20/2024 Bedtime   amoxicillin -clavulanate (AUGMENTIN ) 875-125 MG tablet Take 1 tablet by mouth every 12 (twelve) hours. 10 tablet 0 11/20/2024 Morning   Coenzyme Q10 (COQ10) 100 MG CAPS Take 100 mg by mouth daily.   Past Week   Ensure Plus (ENSURE PLUS) LIQD Take 237 mLs by mouth See admin instructions. Drink 237 ml's of either STRAWBERRY or CHOCOLATE by mouth one to two times a day    Taking   GLUCOSAMINE CHONDROITIN COMPLX PO Take 1 tablet by mouth daily.   Past Week   hydrochlorothiazide  (HYDRODIURIL ) 12.5 MG tablet TAKE 1 TABLET BY MOUTH EVERY DAY 90 tablet 3 11/20/2024 Morning   losartan  (COZAAR ) 50 MG tablet TAKE 1 TABLET BY MOUTH EVERY DAY 90 tablet 3 11/20/2024 Morning   meclizine  (ANTIVERT ) 12.5 MG tablet Take 1 tablet (12.5 mg total) by mouth 3 (three) times daily as needed for dizziness. 60 tablet 0 Unknown   Multiple Vitamins-Minerals (MENS MULTIVITAMIN) TABS Take 1 tablet by mouth daily with supper.   11/19/2024   pravastatin  (PRAVACHOL ) 40 MG tablet TAKE 1 TABLET BY MOUTH EVERY DAY (Patient taking differently: Take 40 mg by mouth at bedtime.) 90 tablet 3 11/19/2024   traMADol  (ULTRAM ) 50 MG tablet Take 1 tablet (50 mg total) by mouth every 6 (six) hours as needed for moderate pain or severe pain. 30 tablet 0 Unknown   triamcinolone  acetonide 40 MG/ML SUSP 40 mg, mupirocin cream 2 % CREA 15 g Apply 1 application  topically as needed (for irritation- face).   Unknown   triamcinolone  cream (KENALOG ) 0.1 % Apply 1 Application topically 2 (two) times daily as needed. (Patient taking differently: Apply 1 Application topically 2 (two) times daily as needed (fotr itching).) 30 g 0 Unknown   warfarin (COUMADIN ) 10 MG tablet TAKE 1 TABLET BY MOUTH DAILY AT 6:00  AM (Patient taking differently: Take 10 mg by mouth every evening.) 90 tablet 1 11/19/2024   warfarin (COUMADIN ) 2.5 MG tablet TAKE 2.5 MG WITH 10 MG TABLET ON MONDAY & THURSDAY FOR DOSE OF 12.5 MG (Patient taking differently: Take 2.5 mg by mouth See admin instructions. Take 2.5 mg by mouth in the evening on ONLY Mondays and Thursdays- in conjunction with one 10 mg tablet *equals 12.5 mg*) 24 tablet 3 11/19/2024   Scheduled:   pravastatin   40 mg Oral Daily   PRN: acetaminophen  **OR** acetaminophen , bisacodyl , ondansetron  **OR** ondansetron  (ZOFRAN ) IV, oxyCODONE -acetaminophen , senna-docusate  Assessment: 56 yoM with  PMH DVT/PE on chronic warfarin, CLL on active chemo, chronic thrombocytopenia, admitted for suspected cellulitis +/- abscess on R buttock. Warfarin originally held d/t concern for possible procedures needed, but Surgery feels wound is more likely pressure-related, and no immediate plans for surgery. On the remote chance that lesion is warfarin-induced tissue necrosis, Onc recommends anticoagulation with heparin  at this time, with Pharmacy to dose.  Baseline INR subtherapeutic Baseline Plt looks to be ~80-90k Prior anticoagulation: warfarin 12.5 mg on Mon & Thurs, 10 mg all other days Last dose 12/22 PM (presumably 12.5 mg)  Significant events:  Today, 11/22/2024: Initial heparin  level = 0.38, therapeutic on 1350 units/hr Hgb 8.4, plts 103 No bleeding noted per RN  Goal of Therapy: Heparin  level 0.3-0.7 units/ml Monitor platelets by anticoagulation protocol: Yes  Plan: Continue heparin  infusion at 1350 units/hr Check confirmatory heparin  level in 8 hours Daily CBC, daily heparin  level once stable Monitor for signs of bleeding or thrombosis  Thank you for allowing pharmacy to be a part of this patients care.  Marget Hench, PharmD Clinical Pharmacist 11/22/2024 12:01 PM         [1] No Known Allergies

## 2024-11-22 NOTE — Plan of Care (Signed)
  Problem: Education: Goal: Knowledge of General Education information will improve Description: Including pain rating scale, medication(s)/side effects and non-pharmacologic comfort measures Outcome: Progressing   Problem: Clinical Measurements: Goal: Cardiovascular complication will be avoided Outcome: Progressing   Problem: Health Behavior/Discharge Planning: Goal: Ability to manage health-related needs will improve Outcome: Not Progressing   Problem: Clinical Measurements: Goal: Ability to maintain clinical measurements within normal limits will improve Outcome: Not Progressing Goal: Will remain free from infection Outcome: Not Progressing Goal: Diagnostic test results will improve Outcome: Not Progressing Goal: Respiratory complications will improve Outcome: Not Progressing

## 2024-11-22 NOTE — Progress Notes (Signed)
"   ° °  Subjective/Chief Complaint: No complaints   Objective: Vital signs in last 24 hours: Temp:  [98 F (36.7 C)-99.1 F (37.3 C)] 99.1 F (37.3 C) (12/25 0417) Pulse Rate:  [73-78] 75 (12/25 0417) Resp:  [18-20] 18 (12/25 0417) BP: (104-110)/(55-61) 110/61 (12/25 0417) SpO2:  [91 %-94 %] 94 % (12/25 0417) Last BM Date : 11/19/24  Intake/Output from previous day: 12/24 0701 - 12/25 0700 In: 952.7 [P.O.:480; I.V.:20.4; IV Piggyback:452.4] Out: 250 [Urine:250] Intake/Output this shift: No intake/output data recorded.  General appearance: alert and cooperative Resp: clear to auscultation bilaterally Cardio: regular rate and rhythm GI: soft, nontender. Buttock discoloration stable. May be developing blister on top  Lab Results:  Recent Labs    11/21/24 0606 11/22/24 0022  WBC 34.6* 42.3*  HGB 8.2* 8.4*  HCT 25.0* 25.9*  PLT 91* 103*   BMET Recent Labs    11/21/24 0606 11/22/24 0022  NA 134* 134*  K 4.4 4.6  CL 105 104  CO2 23 21*  GLUCOSE 89 101*  BUN 31* 31*  CREATININE 1.36* 1.36*  CALCIUM 8.4* 8.8*   PT/INR Recent Labs    11/20/24 1258 11/21/24 0606  LABPROT 17.1* 19.3*  INR 1.3* 1.5*   ABG No results for input(s): PHART, HCO3 in the last 72 hours.  Invalid input(s): PCO2, PO2  Studies/Results: CT PELVIS W CONTRAST Result Date: 11/20/2024 EXAM: CT PELVIS, WITH IV CONTRAST 11/20/2024 07:48:44 PM TECHNIQUE: Axial images were acquired through the pelvis with IV contrast. Reformatted images were reviewed. Automated exposure control, iterative reconstruction, and/or weight based adjustment of the mA/kV was utilized to reduce the radiation dose to as low as reasonably achievable. COMPARISON: None available. CLINICAL HISTORY: Perianal abscess or fistula suspected; Right buttocks abscess. FINDINGS: BONES: No acute fracture or focal osseous lesion. JOINTS: No dislocation. The joint spaces are normal. SOFT TISSUES: Soft tissue thickening of the medial  right buttock with associated inflammatory stranding extending into the perineum. No drainable fluid collection or abscess. No definite fistula. INTRAPELVIC CONTENTS: Confluent adenopathy in the abdomen / pelvis with extensive iliac chain adenopathy. Penile prosthesis. IMPRESSION: 1. Right buttock cellulitis without abscess. 2. Confluent adenopathy in the abdomen/pelvis. Electronically signed by: Norman Gatlin MD 11/20/2024 08:49 PM EST RP Workstation: [id number]    Anti-infectives: Anti-infectives (From admission, onward)    Start     Dose/Rate Route Frequency Ordered Stop   11/20/24 2100  ceFEPIme  (MAXIPIME ) 2 g in sodium chloride  0.9 % 100 mL IVPB        2 g 200 mL/hr over 30 Minutes Intravenous 2 times daily 11/20/24 2008     11/20/24 2100  metroNIDAZOLE  (FLAGYL ) IVPB 500 mg        500 mg 100 mL/hr over 60 Minutes Intravenous 2 times daily 11/20/24 2008     11/20/24 2100  vancomycin  (VANCOREADY) IVPB 1250 mg/250 mL        1,250 mg 166.7 mL/hr over 90 Minutes Intravenous Daily 11/20/24 2008         Assessment/Plan: s/p * No surgery found * Advance diet WBC 42k. Chronic CLL. Stable Continue to treat as pressure sore Will likely benefit from follow up in wound clinic after d/c No plan for surgery at this point  LOS: 2 days    Chad Gross 11/22/2024  "

## 2024-11-22 NOTE — Plan of Care (Signed)

## 2024-11-22 NOTE — Progress Notes (Signed)
 PHARMACY - ANTICOAGULATION CONSULT NOTE  Pharmacy Consult for IV heparin  Indication: Hx DVT/PE, r/o warfarin-induced necrosis  Allergies[1]  Patient Measurements: Height: 5' 11 (180.3 cm) Weight: 80 kg (176 lb 5.9 oz) IBW/kg (Calculated) : 75.3 HEPARIN  DW (KG): 80  Vital Signs: Temp: 98.4 F (36.9 C) (12/25 1544) Temp Source: Oral (12/25 1544) BP: 106/57 (12/25 1544) Pulse Rate: 75 (12/25 1544)  Labs: Recent Labs    11/20/24 1258 11/21/24 0606 11/22/24 0022 11/22/24 1035 11/22/24 1918  HGB 10.2* 8.2* 8.4*  --   --   HCT 31.7* 25.0* 25.9*  --   --   PLT 118* 91* 103*  --   --   LABPROT 17.1* 19.3*  --   --   --   INR 1.3* 1.5*  --   --   --   HEPARINUNFRC  --   --  0.15* 0.38 0.38  CREATININE 1.48* 1.36* 1.36*  --   --     Estimated Creatinine Clearance: 46.9 mL/min (A) (by C-G formula based on SCr of 1.36 mg/dL (H)).   Medical History: 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   Clotting disorder 11/29/62   Have had three episodes of DVTs   ED (erectile dysfunction)    History of DVT (deep vein thrombosis)    Hyperlipidemia    Hypertension    Pericarditis    Pulmonary embolism (HCC)     Medications:  Medications Prior to Admission  Medication Sig Dispense Refill Last Dose/Taking   acalabrutinib  maleate (CALQUENCE ) 100 MG tablet Take 1 tablet (100 mg total) by mouth 2 (two) times daily. 60 tablet 0 11/20/2024 Morning   acetaminophen  (TYLENOL ) 500 MG tablet Take 500-1,000 mg by mouth every 6 (six) hours as needed (for pain or headaches).   11/20/2024 Bedtime   amoxicillin -clavulanate (AUGMENTIN ) 875-125 MG tablet Take 1 tablet by mouth every 12 (twelve) hours. 10 tablet 0 11/20/2024 Morning   Coenzyme Q10 (COQ10) 100 MG CAPS Take 100 mg by mouth daily.   Past Week   Ensure Plus (ENSURE PLUS) LIQD Take 237 mLs by mouth See admin  instructions. Drink 237 ml's of either STRAWBERRY or CHOCOLATE by mouth one to two times a day   Taking   GLUCOSAMINE CHONDROITIN COMPLX PO Take 1 tablet by mouth daily.   Past Week   hydrochlorothiazide  (HYDRODIURIL ) 12.5 MG tablet TAKE 1 TABLET BY MOUTH EVERY DAY 90 tablet 3 11/20/2024 Morning   losartan  (COZAAR ) 50 MG tablet TAKE 1 TABLET BY MOUTH EVERY DAY 90 tablet 3 11/20/2024 Morning   meclizine  (ANTIVERT ) 12.5 MG tablet Take 1 tablet (12.5 mg total) by mouth 3 (three) times daily as needed for dizziness. 60 tablet 0 Unknown   Multiple Vitamins-Minerals (MENS MULTIVITAMIN) TABS Take 1 tablet by mouth daily with supper.   11/19/2024   pravastatin  (PRAVACHOL ) 40 MG tablet TAKE 1 TABLET BY MOUTH EVERY DAY (Patient taking differently: Take 40 mg by mouth at bedtime.) 90 tablet 3 11/19/2024   traMADol  (ULTRAM ) 50 MG tablet Take 1 tablet (50 mg total) by mouth every 6 (six) hours as needed for moderate pain or severe pain. 30 tablet 0 Unknown   triamcinolone  acetonide 40 MG/ML SUSP 40 mg, mupirocin cream 2 % CREA 15 g Apply 1 application  topically as needed (for irritation- face).   Unknown   triamcinolone  cream (KENALOG ) 0.1 % Apply 1  Application topically 2 (two) times daily as needed. (Patient taking differently: Apply 1 Application topically 2 (two) times daily as needed (fotr itching).) 30 g 0 Unknown   warfarin (COUMADIN ) 10 MG tablet TAKE 1 TABLET BY MOUTH DAILY AT 6:00 AM (Patient taking differently: Take 10 mg by mouth every evening.) 90 tablet 1 11/19/2024   warfarin (COUMADIN ) 2.5 MG tablet TAKE 2.5 MG WITH 10 MG TABLET ON MONDAY & THURSDAY FOR DOSE OF 12.5 MG (Patient taking differently: Take 2.5 mg by mouth See admin instructions. Take 2.5 mg by mouth in the evening on ONLY Mondays and Thursdays- in conjunction with one 10 mg tablet *equals 12.5 mg*) 24 tablet 3 11/19/2024   Scheduled:   pravastatin   40 mg Oral Daily   PRN: acetaminophen  **OR** acetaminophen , bisacodyl , ondansetron   **OR** ondansetron  (ZOFRAN ) IV, oxyCODONE -acetaminophen , senna-docusate  Assessment: 28 yoM with PMH DVT/PE on chronic warfarin, CLL on active chemo, chronic thrombocytopenia, admitted for suspected cellulitis +/- abscess on R buttock. Warfarin originally held d/t concern for possible procedures needed, but Surgery feels wound is more likely pressure-related, and no immediate plans for surgery. On the remote chance that lesion is warfarin-induced tissue necrosis, Onc recommends anticoagulation with heparin  at this time, with Pharmacy to dose.  Baseline INR subtherapeutic Baseline Plt looks to be ~80-90k Prior anticoagulation: warfarin 12.5 mg on Mon & Thurs, 10 mg all other days Last dose 12/22 PM (presumably 12.5 mg)  Significant events:  Today, 11/22/2024: Heparin  level 0.38, remains therapeutic on 1350 units/hr Hgb 8.4, plts 103 No complications of therapy noted  Goal of Therapy: Heparin  level 0.3-0.7 units/ml Monitor platelets by anticoagulation protocol: Yes  Plan: Continue heparin  infusion at 1350 units/hr Daily CBC, heparin  level Monitor for signs of bleeding or thrombosis Continue to follow for plan of care  Stefano MARLA Bologna, PharmD, BCPS Clinical Pharmacist 11/22/2024 7:49 PM           [1] No Known Allergies

## 2024-11-23 ENCOUNTER — Other Ambulatory Visit (HOSPITAL_COMMUNITY): Payer: Self-pay

## 2024-11-23 DIAGNOSIS — L0231 Cutaneous abscess of buttock: Secondary | ICD-10-CM | POA: Diagnosis not present

## 2024-11-23 LAB — BASIC METABOLIC PANEL WITH GFR
Anion gap: 11 (ref 5–15)
BUN: 31 mg/dL — ABNORMAL HIGH (ref 8–23)
CO2: 20 mmol/L — ABNORMAL LOW (ref 22–32)
Calcium: 8.6 mg/dL — ABNORMAL LOW (ref 8.9–10.3)
Chloride: 105 mmol/L (ref 98–111)
Creatinine, Ser: 1.29 mg/dL — ABNORMAL HIGH (ref 0.61–1.24)
GFR, Estimated: 56 mL/min — ABNORMAL LOW
Glucose, Bld: 92 mg/dL (ref 70–99)
Potassium: 4.1 mmol/L (ref 3.5–5.1)
Sodium: 136 mmol/L (ref 135–145)

## 2024-11-23 LAB — PROTIME-INR
INR: 1.3 — ABNORMAL HIGH (ref 0.8–1.2)
Prothrombin Time: 16.8 s — ABNORMAL HIGH (ref 11.4–15.2)

## 2024-11-23 LAB — CBC
HCT: 26.8 % — ABNORMAL LOW (ref 39.0–52.0)
Hemoglobin: 8.6 g/dL — ABNORMAL LOW (ref 13.0–17.0)
MCH: 31.4 pg (ref 26.0–34.0)
MCHC: 32.1 g/dL (ref 30.0–36.0)
MCV: 97.8 fL (ref 80.0–100.0)
Platelets: 101 K/uL — ABNORMAL LOW (ref 150–400)
RBC: 2.74 MIL/uL — ABNORMAL LOW (ref 4.22–5.81)
RDW: 15.6 % — ABNORMAL HIGH (ref 11.5–15.5)
WBC: 37.5 K/uL — ABNORMAL HIGH (ref 4.0–10.5)
nRBC: 0 % (ref 0.0–0.2)

## 2024-11-23 LAB — HEPARIN LEVEL (UNFRACTIONATED): Heparin Unfractionated: 0.38 [IU]/mL (ref 0.30–0.70)

## 2024-11-23 MED ORDER — AMOXICILLIN-POT CLAVULANATE 875-125 MG PO TABS
1.0000 | ORAL_TABLET | Freq: Two times a day (BID) | ORAL | 0 refills | Status: AC
  Filled 2024-11-23: qty 20, 10d supply, fill #0

## 2024-11-23 MED ORDER — WARFARIN SODIUM 5 MG PO TABS
15.0000 mg | ORAL_TABLET | Freq: Once | ORAL | Status: AC
  Administered 2024-11-23: 15 mg via ORAL
  Filled 2024-11-23: qty 3

## 2024-11-23 MED ORDER — ENOXAPARIN SODIUM 80 MG/0.8ML IJ SOSY
1.0000 mg/kg | PREFILLED_SYRINGE | Freq: Two times a day (BID) | INTRAMUSCULAR | 0 refills | Status: DC
  Filled 2024-11-23: qty 22.4, 14d supply, fill #0

## 2024-11-23 MED ORDER — ENOXAPARIN SODIUM 80 MG/0.8ML IJ SOSY
1.0000 mg/kg | PREFILLED_SYRINGE | Freq: Two times a day (BID) | INTRAMUSCULAR | Status: DC
  Administered 2024-11-23: 80 mg via SUBCUTANEOUS
  Filled 2024-11-23: qty 0.8

## 2024-11-23 MED ORDER — WARFARIN - PHARMACIST DOSING INPATIENT: Freq: Every day | Status: DC

## 2024-11-23 MED ORDER — DOXYCYCLINE HYCLATE 100 MG PO TABS
100.0000 mg | ORAL_TABLET | Freq: Two times a day (BID) | ORAL | 0 refills | Status: AC
  Filled 2024-11-23: qty 20, 10d supply, fill #0

## 2024-11-23 NOTE — Evaluation (Signed)
 Occupational Therapy Evaluation Patient Details Name: Chad Gross MRN: [id number] DOB: 1946-09-15 Today's Date: 11/23/2024   History of Present Illness   [name] is a 78 yr old male admitted to the hospital 11-20-24 for evaluation of R buttocks abscess. PMH: CLL, chronic DVT, PE, thrombocytopenia, erectile dysfunctional with penile implant, pleural effusion, hypercalcemia, HTN, HLD, pericarditis, arthritis     Clinical Impressions During the session today, the pt performed all assessed functional tasks with supervision or better, including sit to stand, simulated dressing, and functional ambulation into the hall without an assistive device. Per the pt and his significant other, the pt has also gotten up to the bathroom in his room today without the need for assistance. He is very close to his baseline level of functioning for the management of ADLs. He does not required further OT services in the acute care setting. Per the pt and his significant other, home health OT and PT services have already been set-up from his last & recent hospitalization. They would like to resume such services.      If plan is discharge home, recommend the following:   Assist for transportation;Assistance with cooking/housework     Functional Status Assessment   Patient has not had a recent decline in their functional status     Equipment Recommendations   None recommended by OT     Recommendations for Other Services         Precautions/Restrictions    R buttocks abscess     Mobility Bed Mobility Overal bed mobility: Modified Independent Bed Mobility: Supine to Sit     Supine to sit: Modified independent (Device/Increase time)          Transfers Overall transfer level: Needs assistance Equipment used: None Transfers: Sit to/from Stand Sit to Stand: Supervision                  Balance     Sitting balance-Leahy Scale: Good       Standing balance-Leahy  Scale:  (Supervision)          ADL either performed or assessed with clinical judgement   ADL Overall ADL's : At baseline        General ADL Comments: During the session today, the pt performed all assessed ADLs with distant supervision or better. Per the pt and his significant other, he has also gotten up to the bathroom in his room today without the need for assistance. He is close to his baseline level of functioning for the management of ADLs.      Pertinent Vitals/Pain Pain Assessment Pain Location: He denied having pain at rest, however reported some R buttocks discomfort in sitting. Pain Intervention(s): Repositioned, Monitored during session, Limited activity within patient's tolerance     Extremity/Trunk Assessment Upper Extremity Assessment Upper Extremity Assessment: Overall WFL for tasks assessed (BUE AROM and strength WFL)   Lower Extremity Assessment Lower Extremity Assessment: Overall WFL for tasks assessed (BLE AROM and strength WFL)       Communication Communication Communication: No apparent difficulties   Cognition Arousal: Alert Behavior During Therapy: WFL for tasks assessed/performed Cognition: No apparent impairments             OT - Cognition Comments: Oriented x4                 Following commands: Intact       Cueing  General Comments   Cueing Techniques: Verbal cues  Home Living Family/patient expects to be discharged to:: Private residence Living Arrangements: Spouse/significant other Available Help at Discharge: Family Type of Home: House Home Access: Level entry     Home Layout: One level     Bathroom Shower/Tub: Runner, Broadcasting/film/video: Shower seat - built in;Shower Counsellor (2 wheels);Cane - single point          Prior Functioning/Environment Prior Level of Function : Independent/Modified Independent;Driving             Mobility Comments: Independent with  ambulation. ADLs Comments: Independent with ADLs, cleaning, and driving, though he hasn't driven in a few weeks due to being on a restriction secondary to elevated calcium levels. His significant other performs most of the cooking.    OT Problem List: Pain   OT Treatment/Interventions:  (no further OT treatment needs identified)      OT Goals(Current goals can be found in the care plan section)   Acute Rehab OT Goals OT Goal Formulation: All assessment and education complete, DC therapy   OT Frequency:  Other (comment) (N/A)    Co-evaluation PT/OT/SLP Co-Evaluation/Treatment: Yes Reason for Co-Treatment: To address functional/ADL transfers PT goals addressed during session: Balance OT goals addressed during session: ADL's and self-care      AM-PAC OT 6 Clicks Daily Activity     Outcome Measure Help from another person eating meals?: None Help from another person taking care of personal grooming?: None Help from another person toileting, which includes using toliet, bedpan, or urinal?: None Help from another person bathing (including washing, rinsing, drying)?: None Help from another person to put on and taking off regular upper body clothing?: None Help from another person to put on and taking off regular lower body clothing?: None 6 Click Score: 24   End of Session Equipment Utilized During Treatment: Gait belt Nurse Communication: Mobility status  Activity Tolerance: Patient tolerated treatment well Patient left: in chair;with call bell/phone within reach;with family/visitor present  OT Visit Diagnosis: Pain Pain - Right/Left: Right Pain - part of body:  (buttocks)                Time: 9044-8984 OT Time Calculation (min): 20 min Charges:  OT General Charges $OT Visit: 1 Visit OT Evaluation $OT Eval Low Complexity: 1 Low  Delanna JINNY Lesches, OTR/L 11/23/2024, 11:56 AM

## 2024-11-23 NOTE — Progress Notes (Signed)
 IP PROGRESS NOTE  Subjective:   Chad. Gross denies fever and chills.  The right buttock feels better.  The area of erythema and induration is regressing.  He reports having a red/purple lesion on the right buttock chronically.  Objective: Vital signs in last 24 hours: Blood pressure 117/69, pulse 72, temperature 97.7 F (36.5 C), resp. rate 20, height 5' 11 (1.803 m), weight 176 lb 5.9 oz (80 kg), SpO2 91%.  Intake/Output from previous day: 12/25 0701 - 12/26 0700 In: 480 [P.O.:480] Out: 800 [Urine:800]  Physical Exam:  Lymph nodes: 2 cm left axillary node, 1/2-1 cm bilateral axillary nodes Abdomen: Firm mass in the mid abdomen centered to the left of midline Musculoskeletal: Erythema at the medial lower right gluteal region extending to the outer perineum appears to be regressing.  3 cm purple central area is less raised and without fluctuance  Lab Results: Recent Labs    11/22/24 0022 11/23/24 0554  WBC 42.3* 37.5*  HGB 8.4* 8.6*  HCT 25.9* 26.8*  PLT 103* 101*    BMET Recent Labs    11/22/24 0022 11/23/24 0554  NA 134* 136  K 4.6 4.1  CL 104 105  CO2 21* 20*  GLUCOSE 101* 92  BUN 31* 31*  CREATININE 1.36* 1.29*  CALCIUM 8.8* 8.6*    No results found for: CEA1, CEA, CAN199, CA125  Studies/Results: No results found.   Medications: I have reviewed the patient's current medications.  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 He will return for a monthly PT check.  Indwelling IVC catheter.  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        4. 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 erectile dysfunction-followed by Dr. Watt , status post placement of a penile implant at [facility] in April 2014            7. CT of the abdomen/pelvis at [facility] February 2014 for evaluation of hematuria-12 mm external iliac nodes  and left periaortic retroperitoneal lymph node-11 mm-potentially related to CLL      8.  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, peripheral lymphocytosis and small palpable lymph nodes          CT abdomen/pelvis 09/18/2019-extensive abdominal pelvic lymphadenopathy including a left periaortic node measuring 5.7 cm 08/13/2020 CLL FISH panel-no evidence of trisomy 12; no evidence of [id number]; no evidence of p53 deletion or amplification; deletion of ATM present 11/05/2020-peripheral blood TP53 mutation-negative Cycle 1 bendamustine /Rituxan  10/08/2020, 10/09/2020 Cycle 2 bendamustine /Rituxan  11/05/2020, 11/06/2020 Cycle 3 bendamustine /Rituxan  12/03/2020, 12/04/2020 Cycle 4 bendamustine /Rituxan  12/31/2020, 01/01/2021 CTs 01/19/2021-decrease in bulky abdomen/pelvic lymphadenopathy with more widespread ill-defined haziness in the mesentery and retroperitoneal fat, decreased pelvic lymphadenopathy Cycle 5 bendamustine /Rituxan  01/28/2021, 01/29/2021 Cycle 6 bendamustine /rituximab  02/25/2021, 02/26/2021 CT 02/22/2023-mild generalized lymphadenopathy in the neck, significant increase in chest andabdominal/pelvic lymph nodes, dominant nodal mass in the mesentery CT 06/25/2023-progressive lymphadenopathy in the abdomen and pelvis, dominant confluent mass in the upper abdomen/retroperitoneum has enlarged, stable small nodes in the chest Cycle 1 Bendamustine /Rituxan  07/07/2023, 07/08/2023 Cycle 2 Bendamustine /Rituxan  08/08/2023, 08/09/2023 CT abdomen/pelvis 08/30/2023-bulky matted lymphadenopathy and soft tissue mass encasing retroperitoneal structures and extending into the small bowel mesentery slightly diminished in size.  Additional matted gastrohepatic ligament, portacaval, porta hepatis, bilateral iliac and inguinal lymph nodes also slightly diminished in size.  Spleen measures smaller. Cycle 3 Bendamustine /Rituxan  09/05/2023,  09/06/2023 Cycle 4 Bendamustine /Rituxan   10/03/2023, 10/04/2023 10/23/2024 CTs: New moderate right pleural effusion, progressive mediastinal and axillary nodes, confluent abdominal/retroperitoneal adenopathy with a mass at the level of the kidneys measuring 19.1 cm, marked bilateral pelvic sidewall adenopathy Trial of acalabrutinib  11/07/2024, stopped 11/11/2024 when hospitalized, resumed 11/15/2024     9.  Basal cell carcinoma removed from the left superior central forehead 01/21/2020    10.  Renal insufficiency    11.  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  Indeterminate left hepatic lobe lesion on CT 02/22/2023 13.  Fever and altered mental status following cycle 2 Cemiplimab -no source for infection identified 14.  Hypercalcemia-Zometa  10/30/2024 15.  Admission 11/11/2024 through 11/15/2024 with pneumonia, E. coli bacteremia 16.  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  He began treatment with acalabrutinib  on 11/07/2024.  The palpable abdominal mass appears smaller.  He otherwise appears stable from a hematologic standpoint.  He can resume acalabrutinib   since no surgical intervention is planned.  He was admitted last week with pneumonia and E. coli bacteremia.  He was admitted 11/20/2024 with erythema/pain at the right gluteal region with a report of fever at home.  The etiology of the inflammatory process of the right gluteus is unclear.  He most has a pressure sore with associated cellulitis.  He reports having a sore in this area chronically.  The erythema/induration appear improved and he is afebrile.  Cultures remain negative.  The differential diagnosis included warfarin skin necrosis, but I think this is very unlikely given his history. I recommend resuming Coumadin  and covering him with Lovenox  until the PT/INR is therapeutic for several days.   Recommendations: 1.  Antibiotics per the medical service, would consider outpatient course of antibiotics to include coverage of the E. coli from 11/11/2024 2.  Resume acalabrutinib  3.   Lovenox  1 mg/kg SQ twice daily until PT/INR therapeutic 4.  Resume Coumadin  at his chronic outpatient dose 5.  [facility]up [facility] for a PT/INR and CBC on 11/27/2024       LOS: 3 days   Arley Hof, MD   11/23/2024, 7:58 AM

## 2024-11-23 NOTE — Progress Notes (Signed)
 Pharmacy Antibiotic Note  Chad Gross is a 78 y.o. male admitted on 11/20/2024 with cellulitis with possible abscess. Pharmacy has been consulted for vancomycin  and cefepime  dosing.  Day 4 antibiotics WBC elevated but stable Afebrile SCr 1.29, improved slightly  Plan: Continue vancomycin  1250 mg IV q24 hr (est AUC 542 based on SCr 1.48; Vd 0.72) Measure vancomycin  AUC at steady state as indicated SCr q48 while on vanc Cefepime  2 g IV q12 hr; future adjustments per standing pharmacy protocol Flagyl  500 mg IV q12 hr per MD; dosing appropriate What is plan for antibiotics? Possibly change to Unasyn + doxy?  Height: 5' 11 (180.3 cm) Weight: 80 kg (176 lb 5.9 oz) IBW/kg (Calculated) : 75.3  Temp (24hrs), Avg:98.2 F (36.8 C), Min:97.7 F (36.5 C), Max:98.6 F (37 C)  Recent Labs  Lab 11/20/24 1258 11/21/24 0606 11/22/24 0022 11/23/24 0554  WBC 49.7* 34.6* 42.3* 37.5*  CREATININE 1.48* 1.36* 1.36* 1.29*    Estimated Creatinine Clearance: 49.5 mL/min (A) (by C-G formula based on SCr of 1.29 mg/dL (H)).    Allergies[1]  Antimicrobials this admission: 12/23 vancomycin  >>  12/23 cefepime  >>  12/23 Flagyl  >>   Dose adjustments this admission: N/a  Microbiology results: 12/23 BCx: ngtd   Thank you for allowing pharmacy to be a part of this patients care.  Britta Eva Na 11/23/2024 7:38 AM      [1] No Known Allergies

## 2024-11-23 NOTE — Progress Notes (Signed)
"   ° °  Subjective/Chief Complaint: No complaints   Objective: Vital signs in last 24 hours: Temp:  [97.7 F (36.5 C)-98.6 F (37 C)] 97.7 F (36.5 C) (12/26 0646) Pulse Rate:  [72-75] 72 (12/26 0646) Resp:  [17-20] 20 (12/26 0646) BP: (106-117)/(57-69) 117/69 (12/26 0646) SpO2:  [91 %-93 %] 91 % (12/26 0646) Last BM Date : 11/19/24  Intake/Output from previous day: 12/25 0701 - 12/26 0700 In: 480 [P.O.:480] Out: 800 [Urine:800] Intake/Output this shift: No intake/output data recorded.  General appearance: alert and cooperative Resp: clear to auscultation bilaterally Cardio: regular rate and rhythm GI: soft, nontender. Pressure sore stable.   Lab Results:  Recent Labs    11/22/24 0022 11/23/24 0554  WBC 42.3* 37.5*  HGB 8.4* 8.6*  HCT 25.9* 26.8*  PLT 103* 101*   BMET Recent Labs    11/22/24 0022 11/23/24 0554  NA 134* 136  K 4.6 4.1  CL 104 105  CO2 21* 20*  GLUCOSE 101* 92  BUN 31* 31*  CREATININE 1.36* 1.29*  CALCIUM 8.8* 8.6*   PT/INR Recent Labs    11/20/24 1258 11/21/24 0606  LABPROT 17.1* 19.3*  INR 1.3* 1.5*   ABG No results for input(s): PHART, HCO3 in the last 72 hours.  Invalid input(s): PCO2, PO2  Studies/Results: No results found.  Anti-infectives: Anti-infectives (From admission, onward)    Start     Dose/Rate Route Frequency Ordered Stop   11/20/24 2100  ceFEPIme  (MAXIPIME ) 2 g in sodium chloride  0.9 % 100 mL IVPB        2 g 200 mL/hr over 30 Minutes Intravenous 2 times daily 11/20/24 2008     11/20/24 2100  metroNIDAZOLE  (FLAGYL ) IVPB 500 mg        500 mg 100 mL/hr over 60 Minutes Intravenous 2 times daily 11/20/24 2008     11/20/24 2100  vancomycin  (VANCOREADY) IVPB 1250 mg/250 mL        1,250 mg 166.7 mL/hr over 90 Minutes Intravenous Daily 11/20/24 2008         Assessment/Plan: s/p * No surgery found * Advance diet Continue to keep pressure off buttock.  No indication for surgery at this point. Will  sign off Should follow up in wound clinic or with medical doc in next few weeks  LOS: 3 days    Deward Null III 11/23/2024  "

## 2024-11-23 NOTE — Progress Notes (Addendum)
 PHARMACY - ANTICOAGULATION CONSULT NOTE  Pharmacy Consult for IV heparin  >> transition to full dose Lovenox  and restart warfarin Indication: Hx DVT/PE  Allergies[1]  Patient Measurements: Height: 5' 11 (180.3 cm) Weight: 80 kg (176 lb 5.9 oz) IBW/kg (Calculated) : 75.3 HEPARIN  DW (KG): 80  Vital Signs: Temp: 97.7 F (36.5 C) (12/26 0646) BP: 117/69 (12/26 0646) Pulse Rate: 72 (12/26 0646)  Labs: Recent Labs    11/20/24 1258 11/20/24 1258 11/21/24 0606 11/21/24 0606 11/22/24 0022 11/22/24 1035 11/22/24 1918 11/23/24 0554  HGB 10.2*   < > 8.2*  --  8.4*  --   --  8.6*  HCT 31.7*  --  25.0*  --  25.9*  --   --  26.8*  PLT 118*  --  91*  --  103*  --   --  101*  LABPROT 17.1*  --  19.3*  --   --   --   --   --   INR 1.3*  --  1.5*  --   --   --   --   --   HEPARINUNFRC  --   --   --    < > 0.15* 0.38 0.38 0.38  CREATININE 1.48*  --  1.36*  --  1.36*  --   --  1.29*   < > = values in this interval not displayed.    Estimated Creatinine Clearance: 49.5 mL/min (A) (by C-G formula based on SCr of 1.29 mg/dL (H)).   Medical History: 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   Clotting disorder 11/29/62   Have had three episodes of DVTs   ED (erectile dysfunction)    History of DVT (deep vein thrombosis)    Hyperlipidemia    Hypertension    Pericarditis    Pulmonary embolism (HCC)     Medications:  Medications Prior to Admission  Medication Sig Dispense Refill Last Dose/Taking   acalabrutinib  maleate (CALQUENCE ) 100 MG tablet Take 1 tablet (100 mg total) by mouth 2 (two) times daily. 60 tablet 0 11/20/2024 Morning   acetaminophen  (TYLENOL ) 500 MG tablet Take 500-1,000 mg by mouth every 6 (six) hours as needed (for pain or headaches).   11/20/2024 Bedtime   amoxicillin -clavulanate (AUGMENTIN ) 875-125 MG tablet Take 1 tablet by mouth  every 12 (twelve) hours. 10 tablet 0 11/20/2024 Morning   Coenzyme Q10 (COQ10) 100 MG CAPS Take 100 mg by mouth daily.   Past Week   Ensure Plus (ENSURE PLUS) LIQD Take 237 mLs by mouth See admin instructions. Drink 237 ml's of either STRAWBERRY or CHOCOLATE by mouth one to two times a day   Taking   GLUCOSAMINE CHONDROITIN COMPLX PO Take 1 tablet by mouth daily.   Past Week   hydrochlorothiazide  (HYDRODIURIL ) 12.5 MG tablet TAKE 1 TABLET BY MOUTH EVERY DAY 90 tablet 3 11/20/2024 Morning   losartan  (COZAAR ) 50 MG tablet TAKE 1 TABLET BY MOUTH EVERY DAY 90 tablet 3 11/20/2024 Morning   meclizine  (ANTIVERT ) 12.5 MG tablet Take 1 tablet (12.5 mg total) by mouth 3 (three) times daily as needed for dizziness. 60 tablet 0 Unknown   Multiple Vitamins-Minerals (MENS MULTIVITAMIN) TABS Take 1 tablet by mouth daily with supper.   11/19/2024   pravastatin  (PRAVACHOL ) 40 MG tablet TAKE 1 TABLET BY MOUTH EVERY DAY (Patient taking differently: Take 40 mg by mouth at bedtime.)  90 tablet 3 11/19/2024   traMADol  (ULTRAM ) 50 MG tablet Take 1 tablet (50 mg total) by mouth every 6 (six) hours as needed for moderate pain or severe pain. 30 tablet 0 Unknown   triamcinolone  acetonide 40 MG/ML SUSP 40 mg, mupirocin cream 2 % CREA 15 g Apply 1 application  topically as needed (for irritation- face).   Unknown   triamcinolone  cream (KENALOG ) 0.1 % Apply 1 Application topically 2 (two) times daily as needed. (Patient taking differently: Apply 1 Application topically 2 (two) times daily as needed (fotr itching).) 30 g 0 Unknown   warfarin (COUMADIN ) 10 MG tablet TAKE 1 TABLET BY MOUTH DAILY AT 6:00 AM (Patient taking differently: Take 10 mg by mouth every evening.) 90 tablet 1 11/19/2024   warfarin (COUMADIN ) 2.5 MG tablet TAKE 2.5 MG WITH 10 MG TABLET ON MONDAY & THURSDAY FOR DOSE OF 12.5 MG (Patient taking differently: Take 2.5 mg by mouth See admin instructions. Take 2.5 mg by mouth in the evening on ONLY Mondays and Thursdays-  in conjunction with one 10 mg tablet *equals 12.5 mg*) 24 tablet 3 11/19/2024   Scheduled:   pravastatin   40 mg Oral Daily   PRN: acetaminophen  **OR** acetaminophen , bisacodyl , ondansetron  **OR** ondansetron  (ZOFRAN ) IV, oxyCODONE -acetaminophen , senna-docusate  Assessment: 28 yoM with PMH DVT/PE on chronic warfarin, CLL on active chemo, chronic thrombocytopenia, admitted for suspected cellulitis +/- abscess on R buttock. Warfarin originally held d/t concern for possible procedures needed, but Surgery feels wound is more likely pressure-related, and no immediate plans for surgery. On the remote chance that lesion is warfarin-induced tissue necrosis, Onc recommends anticoagulation with heparin  at this time, with Pharmacy to dose.  Baseline INR subtherapeutic Baseline Plt looks to be ~80-90k Prior anticoagulation: warfarin 12.5 mg on Mon & Thurs, 10 mg all other days Last dose 12/22 PM (presumably 12.5 mg)  Significant events:  Today, 11/23/2024: per Md to change IV heparin  to Lovenox  and restart warfarin Heparin  level remains therapeutic on 1350 units/hr Hgb 8.6, plts 101 both stable Note patient on Flagyl  which will interfere with INR causing reduction in warfarin dosage requirements  Goal of Therapy: Heparin  level 0.6-1.1 units/ml INR 2-3 Monitor platelets by anticoagulation protocol: Yes  Plan: Discontinue IV heparin , start Lovenox  80mg  (1mg /kg) SQ q12 and continue until INR 2-3 Warfarin 15mg  PO x 1 this evening at 1600 Daily CBC, heparin  level Monitor for signs of bleeding or thrombosis Continue to follow for plan of care   Eva CHRISTELLA Allis, PharmD, BCPS Secure Chat if ?s 11/23/2024 7:37 AM            [1] No Known Allergies

## 2024-11-23 NOTE — TOC Transition Note (Signed)
 Transition of Care [facility]) - Discharge Note   Patient Details  Name: Chad Gross MRN: [id number] Date of Birth: [birth year]  [facility] CM/SW Contact:  Bascom Service, RN Phone Number: 11/23/2024, 4:23 PM   Clinical Narrative:  [facility] rep [facility] aware-HHPT/OT. Has own transport home. No further CM needs.     Final next level of care: Home w Home Health Services Barriers to Discharge: No Barriers Identified   Patient Goals and CMS Choice Patient states their goals for this hospitalization and ongoing recovery are:: Home CMS Medicare.gov Compare Post Acute Care list provided to:: Patient Choice offered to / list presented to : Patient [place] ownership interest in [facility].provided to:: Patient    Discharge Placement                       Discharge Plan and Services Additional resources added to the After Visit Summary for     Discharge Planning Services: CM Consult Post Acute Care Choice: Home Health                    HH Arranged: PT, OT [facility] Agency: [facility] Date [facility] Agency Contacted: 11/23/24 Time HH Agency Contacted: 1623 Representative spoke with at [facility] Agency: cory  Social Drivers of Health (SDOH) Interventions SDOH Screenings   Food Insecurity: No Food Insecurity (11/20/2024)  Housing: Low Risk (11/20/2024)  Transportation Needs: No Transportation Needs (11/20/2024)  Utilities: Not At Risk (11/20/2024)  Alcohol Screen: Low Risk (10/28/2024)  Depression (PHQ2-9): Low Risk (11/20/2024)  Financial Resource Strain: Low Risk (10/28/2024)  Physical Activity: Sufficiently Active (10/28/2024)  Social Connections: Moderately Isolated (11/20/2024)  Stress: No Stress Concern Present (10/28/2024)  Tobacco Use: Low Risk (11/20/2024)  Health Literacy: Adequate Health Literacy (02/14/2024)     Readmission Risk Interventions     No data to display

## 2024-11-23 NOTE — Discharge Summary (Signed)
 " Physician Discharge Summary   Patient: Chad Gross MRN: [id number] DOB: 07-Jun-1946  Admit date:     11/20/2024  Discharge date: 11/23/2024  Discharge Physician: Lebron JINNY Cage   PCP: de [place], Raymond J, MD   Recommendations at discharge:   Follow-up with PCP Follow-up with oncology as scheduled  Discharge Diagnoses: Principal Problem:   Abscess of right buttock Active Problems:   Essential hypertension   Thrombocytopenia   History of DVT (deep vein thrombosis)   Lymphocytosis    Hospital Course: Chad Gross is a 78 y.o. 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  Patient reports after his recent hospitalization for pneumonia and E. coli bacteremia, he was feeling well.  Over the last 3 to 4 days PTA, he has noticed a painful and hard cyst on his right buttocks, reported low grade fever. Patient has been taking Augmentin  since discharge with the last dose on 12/23. Initial vitals on admission showed Tmax 99, SBP 110-120s. Labs from oncology office shows creatinine 1.48, WBC 49.7, Hgb 10.2, platelet 118, PT/INR 17.1/1.3.  General surgery consulted.  Patient admitted for further management.    Today, pt continues to deny any new complaints. Reports buttock pain is improved, denies any fever/chills. Discussed with patient and girlfriend at bedside, about the need for lovenox . RN educated patient on how to administer lovenox .     Assessment and Plan:  Right buttock abscess ??Warfarin skin necrosis Currently afebrile, with chronic leukocytosis due to CLL Noted indurated, fluctuance, tender and erythematous area on his  right buttocks concerning for abscess CT pelvis noted right buttock cellulitis without abscess BC x 2 NGTD General Surgery consulted, no indication for surgical debridement Oncology on board, Dr. Deanne, appears to be improving, unlikely warfarin skin necrosis, resume Coumadin  and bridge with Lovenox  S/p IV vancomycin , cefepime  and flagyl , switch to p.o. doxycycline  and Augmentin  x 10 days (discussed with ID Dr. Dennise on 12/26) Consider biopsy of the right gluteal lesion to rule out warfarin skin necrosis if the area worsens or fails to improve PCP in 1 week   Chronic thrombocytopenia and anemia Secondary to CLL Oncology outpatient follow-up   HTN BP stable/soft Continue to hold HCTZ, restart losartan    CKD 3A   CLL Followed by Dr. Cloretta at the cancer center, started on Acalabrutinib  earlier this month Continue Acalabrutinib  as per oncology   Chronic DVT History of PE On indefinite anticoagulation with Coumadin  due to recurrent DVTs Continue warfarin as per oncology and bridge with Lovenox  Outpatient follow-up with oncology   HLD Continue pravastatin      Wound 11/12/24 1652 Pressure Injury Coccyx Medial Stage 3 -  Full thickness tissue loss. Subcutaneous fat may be visible but bone, tendon or muscle are NOT exposed. (Active)            Consultants: General surgery, oncology Procedures performed: None Disposition: Home Diet recommendation:  Cardiac diet    DISCHARGE MEDICATION: Allergies as of 11/23/2024   No Known Allergies      Medication List     PAUSE taking these medications    hydrochlorothiazide  12.5 MG tablet Wait to take this until your doctor or other care provider tells you to start again. Commonly known as: HYDRODIURIL  TAKE 1 TABLET BY MOUTH EVERY  DAY       TAKE these medications    acetaminophen  500 MG tablet Commonly known as: TYLENOL  Take 500-1,000 mg by mouth every 6 (six) hours as needed (for pain or headaches).    amoxicillin -clavulanate 875-125 MG tablet Commonly known as: AUGMENTIN  Take 1 tablet by mouth 2 (two) times daily for 10 days. What changed: when to take this   Calquence  100 MG tablet Generic drug: acalabrutinib  maleate Take 1 tablet (100 mg total) by mouth 2 (two) times daily.   CoQ10 100 MG Caps Take 100 mg by mouth daily.   doxycycline  100 MG tablet Commonly known as: VIBRA -TABS Take 1 tablet (100 mg total) by mouth 2 (two) times daily for 10 days.   enoxaparin  80 MG/0.8ML injection Commonly known as: LOVENOX  Inject 0.8 mLs (80 mg total) into the skin every 12 (twelve) hours.   Ensure Plus Liqd Take 237 mLs by mouth See admin instructions. Drink 237 ml's of either STRAWBERRY or CHOCOLATE by mouth one to two times a day   GLUCOSAMINE CHONDROITIN COMPLX PO Take 1 tablet by mouth daily.   losartan  50 MG tablet Commonly known as: COZAAR  TAKE 1 TABLET BY MOUTH EVERY DAY   meclizine  12.5 MG tablet Commonly known as: ANTIVERT  Take 1 tablet (12.5 mg total) by mouth 3 (three) times daily as needed for dizziness.   Mens Multivitamin Tabs Take 1 tablet by mouth daily with supper.   pravastatin  40 MG tablet Commonly known as: PRAVACHOL  TAKE 1 TABLET BY MOUTH EVERY DAY What changed: when to take this   traMADol  50 MG tablet Commonly known as: ULTRAM  Take 1 tablet (50 mg total) by mouth every 6 (six) hours as needed for moderate pain or severe pain.   triamcinolone  acetonide 40 MG/ML SUSP 40 mg, mupirocin cream 2 % CREA 15 g Apply 1 application  topically as needed (for irritation- face).   triamcinolone  cream 0.1 % Commonly known as: KENALOG  Apply 1 Application topically 2 (two) times daily as needed. What changed: reasons to take this   warfarin 10 MG tablet Commonly known as: COUMADIN  TAKE 1 TABLET BY MOUTH DAILY AT 6:00 AM What changed: See the new instructions.   warfarin 2.5 MG tablet Commonly known as: COUMADIN  TAKE 2.5 MG WITH 10 MG TABLET ON MONDAY &  THURSDAY FOR DOSE OF 12.5 MG What changed: See the new instructions.               Discharge Care Instructions  (From admission, onward)           Start     Ordered   11/23/24 0000  Discharge wound care:       Comments: Foam dressing  Every 3 days     Comments: Mepilex foam dressing to affected buttock   11/23/24 1442            Discharge Exam: Filed Weights   11/20/24 1821  Weight: 80 kg   General: NAD  Cardiovascular: S1, S2 present Respiratory: CTAB Abdomen: Soft, nontender, nondistended, bowel sounds present Musculoskeletal: No bilateral pedal edema noted Skin: Improving central necrotic area on right buttock Psychiatry: Normal mood   Condition at discharge: stable  The results of significant diagnostics from this hospitalization (including imaging, microbiology, ancillary and laboratory) are listed below for reference.   Imaging Studies: CT PELVIS W CONTRAST Result Date: 11/20/2024 EXAM: CT PELVIS, WITH IV CONTRAST 11/20/2024 07:48:44 PM TECHNIQUE: Axial images were acquired through the pelvis with IV contrast. Reformatted images were reviewed. Automated  exposure control, iterative reconstruction, and/or weight based adjustment of the mA/kV was utilized to reduce the radiation dose to as low as reasonably achievable. COMPARISON: None available. CLINICAL HISTORY: Perianal abscess or fistula suspected; Right buttocks abscess. FINDINGS: BONES: No acute fracture or focal osseous lesion. JOINTS: No dislocation. The joint spaces are normal. SOFT TISSUES: Soft tissue thickening of the medial right buttock with associated inflammatory stranding extending into the perineum. No drainable fluid collection or abscess. No definite fistula. INTRAPELVIC CONTENTS: Confluent adenopathy in the abdomen / pelvis with extensive iliac chain adenopathy. Penile prosthesis. IMPRESSION: 1. Right buttock cellulitis without abscess. 2. Confluent adenopathy in the abdomen/pelvis.  Electronically signed by: Norman Gatlin MD 11/20/2024 08:49 PM EST RP Workstation: [id number]   DG Chest 2 View Result Date: 11/11/2024 EXAM: 2 VIEW(S) XRAY OF THE CHEST 11/11/2024 07:41:00 PM COMPARISON: 02/10/2024 CLINICAL HISTORY: sepsis? FINDINGS: LUNGS AND PLEURA: Right lower lobe airspace opacity. Left base airspace opacity. Small right pleural effusion. No pneumothorax. HEART AND MEDIASTINUM: Aortic calcification. No acute abnormality of the cardiac and mediastinal silhouettes. BONES AND SOFT TISSUES: No acute osseous abnormality. IMPRESSION: 1. Right lower lobe and left base airspace opacities, suspicious for pneumonia. 2. Small right pleural effusion. Electronically signed by: Oneil Devonshire MD 11/11/2024 07:43 PM EST RP Workstation: [id number]    Microbiology: Results for orders placed or performed during the hospital encounter of 11/20/24  Culture, blood (Routine X 2) w Reflex to ID Panel     Status: None (Preliminary result)   Collection Time: 11/20/24  7:30 PM   Specimen: BLOOD LEFT FOREARM  Result Value Ref Range Status   Specimen Description   Final    BLOOD LEFT FOREARM Performed at [facility], [address] [place], [place] [address]    Special Requests   Final    BOTTLES DRAWN AEROBIC AND ANAEROBIC Blood Culture adequate volume Performed at [facility], [address], [place], [place] [address]    Culture   Final    NO GROWTH 3 DAYS Performed at [facility], [address] [place], [place] [address]    Report Status PENDING  Incomplete  Culture, blood (Routine X 2) w Reflex to ID Panel     Status: None (Preliminary result)   Collection Time: 11/20/24  7:33 PM   Specimen: BLOOD LEFT HAND  Result Value Ref Range Status   Specimen Description   Final    BLOOD LEFT HAND Performed at [facility], [address], [place], [place] [address]    Special Requests   Final    BOTTLES DRAWN AEROBIC AND ANAEROBIC Blood Culture  adequate volume Performed at [facility], [address] [place], [place] [address]    Culture   Final    NO GROWTH 3 DAYS Performed at [facility], [address] [place], [place] [address]    Report Status PENDING  Incomplete   *Note: Due to a large number of results and/or encounters for the requested time period, some results have not been displayed. A complete set of results can be found in Results Review.    Labs: CBC: Recent Labs  Lab 11/20/24 1258 11/21/24 0606 11/22/24 0022 11/23/24 0554  WBC 49.7* 34.6* 42.3* 37.5*  NEUTROABS 3.8  --   --   --   HGB 10.2* 8.2* 8.4* 8.6*  HCT 31.7* 25.0* 25.9* 26.8*  MCV 98.4 98.0 98.5 97.8  PLT 118* 91* 103* 101*   Basic Metabolic  Panel: Recent Labs  Lab 11/20/24 1258 11/21/24 0606 11/22/24 0022 11/23/24 0554  NA 135 134* 134* 136  K 4.4 4.4 4.6 4.1  CL 101 105 104 105  CO2 26 23 21* 20*  GLUCOSE 93 89 101* 92  BUN 31* 31* 31* 31*  CREATININE 1.48* 1.36* 1.36* 1.29*  CALCIUM 10.2 8.4* 8.8* 8.6*   Liver Function Tests: Recent Labs  Lab 11/20/24 1258  AST 15  ALT 8  ALKPHOS 43  BILITOT 0.7  PROT 9.2*  ALBUMIN 4.0   CBG: No results for input(s): GLUCAP in the last 168 hours.  Discharge time spent: greater than 30 minutes.  Signed: Lebron JINNY Cage, MD [facility] 11/23/2024 "

## 2024-11-23 NOTE — Evaluation (Signed)
 Physical Therapy Evaluation Patient Details Name: Chad Gross MRN: [id number] DOB: 04-28-1946 Today's Date: 11/23/2024  History of Present Illness  [name] is a 78 yr old male admitted to the hospital 11-20-24 for evaluation of R buttocks abscess. PMH: CLL, chronic DVT, PE, thrombocytopenia, erectile dysfunctional with penile implant, pleural effusion, hypercalcemia, HTN, HLD, pericarditis, arthritis  Clinical Impression  Pt admitted as above and presenting with functional mobility limitations 2* generalized weakness, decreased activity tolerance and ambulatory balance deficits.  Pt very motivated and should progress to dc home with family assist.  Pt reports currently on caseload with [facility] PT/OTand would like to continue with same.        If plan is discharge home, recommend the following: A little help with walking and/or transfers;Assistance with cooking/housework;Help with stairs or ramp for entrance;Assist for transportation;A little help with bathing/dressing/bathroom   Can travel by private vehicle        Equipment Recommendations None recommended by PT  Recommendations for Other Services       Functional Status Assessment Patient has had a recent decline in their functional status and demonstrates the ability to make significant improvements in function in a reasonable and predictable amount of time.     Precautions / Restrictions Precautions Precautions: Fall Precaution/Restrictions Comments: monitor sats, BP Restrictions Weight Bearing Restrictions Per Provider Order: No      Mobility  Bed Mobility Overal bed mobility: Modified Independent Bed Mobility: Supine to Sit     Supine to sit: Modified independent (Device/Increase time)          Transfers Overall transfer level: Needs assistance Equipment used: None Transfers: Sit to/from Stand Sit to Stand: Supervision           General transfer comment: Mild instability but no physical  assist    Ambulation/Gait Ambulation/Gait assistance: Contact guard assist Gait Distance (Feet): 300 Feet Assistive device: None Gait Pattern/deviations: Step-through pattern, Wide base of support, Shuffle Gait velocity: decreased     General Gait Details: instability noted improving with increased distance ambulated but no overt LOB; standing rest breaks for task completion  Stairs            Wheelchair Mobility     Tilt Bed    Modified Rankin (Stroke Patients Only)       Balance Overall balance assessment: Needs assistance Sitting-balance support: Feet supported Sitting balance-Leahy Scale: Good     Standing balance support: No upper extremity supported Standing balance-Leahy Scale: Fair                               Pertinent Vitals/Pain Pain Assessment Pain Assessment: No/denies pain Pain Location: He denied having pain at rest, however reported some R buttocks discomfort in sitting. Pain Intervention(s): Limited activity within patient's tolerance, Monitored during session    Home Living Family/patient expects to be discharged to:: Private residence Living Arrangements: Spouse/significant other Available Help at Discharge: Family Type of Home: House Home Access: Level entry       Home Layout: One level Home Equipment: Shower seat - built in;Shower Counsellor (2 wheels);Cane - single point Additional Comments: has neighbor and daughter close when sig other working    Prior Function Prior Level of Function : Independent/Modified Independent;Driving             Mobility Comments: Independent with ambulation. ADLs Comments: Independent with ADLs, cleaning, and driving, though he hasn't driven in a few weeks  due to being on a restriction secondary to elevated calcium levels. His significant other performs most of the cooking.     Extremity/Trunk Assessment   Upper Extremity Assessment Upper Extremity Assessment: Overall  WFL for tasks assessed    Lower Extremity Assessment Lower Extremity Assessment: Overall WFL for tasks assessed    Cervical / Trunk Assessment Cervical / Trunk Assessment: Kyphotic  Communication   Communication Communication: No apparent difficulties    Cognition Arousal: Alert Behavior During Therapy: WFL for tasks assessed/performed   PT - Cognitive impairments: No apparent impairments                         Following commands: Intact       Cueing Cueing Techniques: Verbal cues     General Comments      Exercises     Assessment/Plan    PT Assessment Patient needs continued PT services  PT Problem List Decreased strength;Decreased activity tolerance;Decreased mobility       PT Treatment Interventions DME instruction;Gait training;Functional mobility training;Therapeutic activities;Therapeutic exercise;Patient/family education    PT Goals (Current goals can be found in the Care Plan section)  Acute Rehab PT Goals Patient Stated Goal: go home PT Goal Formulation: With patient/family Time For Goal Achievement: 11/26/24 Potential to Achieve Goals: Good    Frequency Min 3X/week     Co-evaluation PT/OT/SLP Co-Evaluation/Treatment: Yes Reason for Co-Treatment: To address functional/ADL transfers PT goals addressed during session: Mobility/safety with mobility;Balance OT goals addressed during session: ADL's and self-care       AM-PAC PT 6 Clicks Mobility  Outcome Measure Help needed turning from your back to your side while in a flat bed without using bedrails?: A Little Help needed moving from lying on your back to sitting on the side of a flat bed without using bedrails?: A Little Help needed moving to and from a bed to a chair (including a wheelchair)?: A Little Help needed standing up from a chair using your arms (e.g., wheelchair or bedside chair)?: A Little Help needed to walk in hospital room?: A Little Help needed climbing 3-5 steps  with a railing? : A Little 6 Click Score: 18    End of Session Equipment Utilized During Treatment: Gait belt Activity Tolerance: Patient tolerated treatment well;Patient limited by fatigue Patient left: in chair;with call bell/phone within reach;with chair alarm set;with family/visitor present Nurse Communication: Mobility status PT Visit Diagnosis: Unsteadiness on feet (R26.81);Muscle weakness (generalized) (M62.81);Difficulty in walking, not elsewhere classified (R26.2)    Time: 9044-8983 PT Time Calculation (min) (ACUTE ONLY): 21 min   Charges:   PT Evaluation $PT Eval Low Complexity: 1 Low   PT General Charges $$ ACUTE PT VISIT: 1 Visit         [facility] PT Acute Rehabilitation Services Office [phone]   Ninetta Adelstein 11/23/2024, 3:53 PM

## 2024-11-25 LAB — CULTURE, BLOOD (ROUTINE X 2)
Culture: NO GROWTH
Culture: NO GROWTH
Special Requests: ADEQUATE
Special Requests: ADEQUATE

## 2024-11-26 ENCOUNTER — Other Ambulatory Visit: Payer: Self-pay

## 2024-11-26 ENCOUNTER — Encounter: Payer: Self-pay | Admitting: Oncology

## 2024-11-26 ENCOUNTER — Other Ambulatory Visit: Payer: Self-pay | Admitting: Oncology

## 2024-11-26 ENCOUNTER — Other Ambulatory Visit: Payer: Self-pay | Admitting: *Deleted

## 2024-11-26 ENCOUNTER — Telehealth: Payer: Self-pay

## 2024-11-26 DIAGNOSIS — C9110 Chronic lymphocytic leukemia of B-cell type not having achieved remission: Secondary | ICD-10-CM

## 2024-11-26 DIAGNOSIS — Z7901 Long term (current) use of anticoagulants: Secondary | ICD-10-CM

## 2024-11-26 NOTE — Progress Notes (Signed)
 Specialty Pharmacy Ongoing Clinical Assessment Note  Chad Gross is a 78 y.o. male who is being followed by the specialty pharmacy service for RxSp Oncology   Patient's specialty medication(s) reviewed today: Acalabrutinib  Maleate (Calquence )   Missed doses in the last 4 weeks: 4 (medication was held while patient was hospitalized)   Patient/Caregiver did not have any additional questions or concerns.   Therapeutic benefit summary: Patient is achieving benefit   Adverse events/side effects summary: No adverse events/side effects   Patient's therapy is appropriate to: Continue    Goals Addressed             This Visit's Progress    Achieve or maintain remission   No change    Patient is initiating therapy. Patient will maintain adherence         Follow up: 3 months  [name] Specialty Pharmacist

## 2024-11-26 NOTE — Telephone Encounter (Signed)
 Will refill on 12/30 after MD visit

## 2024-11-26 NOTE — Transitions of Care (Post Inpatient/ED Visit) (Addendum)
 "  11/26/2024  Name: Chad Gross MRN: [id number] DOB: 1946-05-15  Today's TOC FU Call Status: Today's TOC FU Call Status:: Successful TOC FU Call Completed  Patient's Name and Date of Birth confirmed. Name, DOB  Transition Care Management Follow-up Telephone Call Date of Discharge: 11/23/24 Discharge Facility: [facility]) Type of Discharge: Inpatient Admission Primary Inpatient Discharge Diagnosis:: Abscess of right buttocks How have you been since you were released from the hospital?: Better Any questions or concerns?: No  Items Reviewed: Did you receive and understand the discharge instructions provided?: Yes Medications obtained,verified, and reconciled?: Yes (Medications Reviewed) Any new allergies since your discharge?: No Dietary orders reviewed?: Yes Type of Diet Ordered:: Regular diet with Ensure Plus Do you have support at home?: Yes People in Home [RPT]: significant other  Medications Reviewed Today: Medications Reviewed Today     Reviewed by Eilleen Richerd GRADE, RN (Registered Nurse) on 11/26/24 at [phone]  Med List Status: <None>   Medication Order Taking? Sig Documenting Provider Last Dose Status Informant  acalabrutinib  maleate (CALQUENCE ) 100 MG tablet [id number]  Take 1 tablet (100 mg total) by mouth 2 (two) times daily. Cloretta Arley NOVAK, MD  Active Self  acetaminophen  (TYLENOL ) 500 MG tablet [id number]  Take 500-1,000 mg by mouth every 6 (six) hours as needed (for pain or headaches). [provider]  Active Self  amoxicillin -clavulanate (AUGMENTIN ) 875-125 MG tablet [id number]  Take 1 tablet by mouth 2 (two) times daily for 10 days. Ezenduka, Nkeiruka J, MD  Active   Coenzyme Q10 (COQ10) 100 MG CAPS [id number] Yes Take 100 mg by mouth daily. [provider]  Active Self  doxycycline  (VIBRA -TABS) 100 MG tablet [id number] Yes Take 1 tablet (100 mg total) by mouth 2 (two) times daily for 10 days. Ezenduka, Nkeiruka J, MD  Active   enoxaparin   (LOVENOX ) 80 MG/0.8ML injection [id number] Yes Inject 0.8 mLs (80 mg total) into the skin every 12 (twelve) hours. Ezenduka, Nkeiruka J, MD  Active   Ensure Plus (ENSURE PLUS) BERNICE [id number] Yes Take 237 mLs by mouth See admin instructions. Drink 237 ml's of either STRAWBERRY or CHOCOLATE by mouth one to two times a day [provider]  Active Self  GLUCOSAMINE CHONDROITIN COMPLX PO [id number] Yes Take 1 tablet by mouth daily. [provider]  Active Self  hydrochlorothiazide  (HYDRODIURIL ) 12.5 MG tablet [id number]  TAKE 1 TABLET BY MOUTH EVERY DAY de [place], Quintin PARAS, MD  Active Self           Med Note LESLY, RICHERD GRADE Kitchens Nov 26, 2024  9:47 AM) 11/26/24 Continue to pause until he sees Dr. Cloretta  losartan  (COZAAR ) 50 MG tablet [id number] Yes TAKE 1 TABLET BY MOUTH EVERY DAY [place], Quintin PARAS, MD  Active Self  meclizine  (ANTIVERT ) 12.5 MG tablet [id number] Yes Take 1 tablet (12.5 mg total) by mouth 3 (three) times daily as needed for dizziness. Cloretta Arley NOVAK, MD  Active Self  Multiple Vitamins-Minerals (MENS MULTIVITAMIN) TABS [id number] Yes Take 1 tablet by mouth daily with supper. [provider]  Active Self  pravastatin  (PRAVACHOL ) 40 MG tablet [id number]  TAKE 1 TABLET BY MOUTH EVERY DAY  Patient taking differently: Take 40 mg by mouth at bedtime.   de [place], Quintin PARAS, MD  Active Self  traMADol  (ULTRAM ) 50 MG tablet [id number] Yes Take 1 tablet (50 mg total) by mouth every 6 (six) hours as needed for moderate pain or severe pain. de [place], Quintin PARAS, MD  Active Self  triamcinolone  acetonide 40 MG/ML SUSP 40 mg, mupirocin cream 2 % CREA 15 g [id number]  Apply 1 application  topically as needed (for irritation- face). [provider]  Active Self  triamcinolone  cream (KENALOG ) 0.1 % [id number]  Apply 1 Application topically 2 (two) times daily as needed.  Patient taking differently: Apply 1 Application topically 2 (two) times daily as needed (fotr itching).   de  [place], Raymond J, MD  Active Self  warfarin (COUMADIN ) 10 MG tablet [id number]  TAKE 1 TABLET BY MOUTH DAILY AT 6:00 AM  Patient taking differently: Take 10 mg by mouth every evening.   Cloretta Arley NOVAK, MD  Active Self           Med Note RENNIS, DUROJAHYE' R   Mon Nov 12, 2024  2:43 AM)    warfarin (COUMADIN ) 2.5 MG tablet [id number]  TAKE 2.5 MG WITH 10 MG TABLET ON MONDAY & THURSDAY FOR DOSE OF 12.5 MG  Patient taking differently: Take 2.5 mg by mouth See admin instructions. Take 2.5 mg by mouth in the evening on ONLY Mondays and Thursdays- in conjunction with one 10 mg tablet *equals 12.5 mgDEWAINE Cloretta, Arley NOVAK, MD  Active Self           Med Note MARISA, NATHANEL SAILOR   Wed Nov 21, 2024  4:00 PM)    Med List Note Marisa Nathanel SAILOR, CPhT 11/21/24 1556): Calquence  filled at [facility] (Specialty)  Last name is [facility] and Equipment/Supplies: Were Home Health Services Ordered?: Yes Name of Home Health Agency:: [facility] Has Agency set up a time to come to your home?: No EMR reviewed for Home Health Orders: Orders present/patient has not received call (refer to CM for follow-up) Were you able to get the equipment/medical supplies?: No Do you have any questions related to the use of the equipment/supplies?: No  Functional Questionnaire: Do you need assistance with bathing/showering or dressing?: No Do you need assistance with meal preparation?: No Do you need assistance with eating?: No Do you have difficulty maintaining continence: No Do you need assistance with getting out of bed/getting out of a chair/moving?: No Do you have difficulty managing or taking your medications?: No  Follow up appointments reviewed: PCP Follow-up appointment confirmed?: Yes Date of PCP follow-up appointment?: 12/24/24 Follow-up Provider: Quintin Penne Driscilla Lionel Follow-up appointment confirmed?: Yes Date of Specialist follow-up appointment?: 12/03/24 Follow-Up  Specialty Provider:: Dr. Acquanetta Staff Do you need transportation to your follow-up appointment?: No Do you understand care options if your condition(s) worsen?: Yes-patient verbalized understanding  SDOH Interventions Today    Flowsheet Row Most Recent Value  SDOH Interventions   Food Insecurity Interventions Intervention Not Indicated  Housing Interventions Intervention Not Indicated  Transportation Interventions Intervention Not Indicated  Utilities Interventions Intervention Not Indicated    Richerd Fish, RN, BSN, CCM [place]  [facility], [facility] Health Care Management Coordinator Direct Dial: [phone]        "

## 2024-11-26 NOTE — Progress Notes (Signed)
 Specialty Pharmacy Refill Coordination Note  Chad Gross is a 78 y.o. male contacted today regarding refills of specialty medication(s) Acalabrutinib  Maleate (Calquence )   Patient requested Delivery   Delivery date: 12/05/24   Verified address: [address] [place] [address]   Medication will be filled on: 12/04/24   This fill date is pending response to refill request from provider. Patient is aware and if they have not received fill by intended date they must follow up with pharmacy.

## 2024-11-26 NOTE — Patient Instructions (Signed)
 Visit Information  Thank you for taking time to visit with me today. Please don't hesitate to contact me if I can be of assistance to you before our next scheduled telephone appointment.  Our next appointment is by telephone on 12/04/24 at 10:00 AM with Davina Green, TOC RN  Following is a copy of your care plan:   Goals Addressed             This Visit's Progress    VBCI Transitions of Care (TOC) Care Plan       Patient readmitted to hospital.  Goals closed - reactivated 11/26/24 11/26/24 Restarted Care Plan with ongoing follow up for pneumonia and currently with Abscess to right buttocks Problems:  Recent Hospitalization for treatment of community acquired pneumonia 11/26/24 Restart care plan - wound to right buttocks,    No Hospital Follow Up Provider appointment attempted to assist patient with scheduling hospital follow up appointment. No appointment with provider until 12/11/23. Advised patient to call her primary care provider office to attempt to schedule an earlier visit.  Patient states the earliest hospital follow up is not until 12/24/24. 11/26/24 Earliest hospital follow up is noted for 12/24/24 with PCP; patient endorses follow up with Dr. Cloretta for most of his medical management at this time. He is anticipating a call from Dr. Andriette office for labs and follow up. Patient desires to keep current appointment. Patient has a follow up with Pulmonologist 12/03/2024 as well.  Goal:  Over the next 30 days, the patient will not experience hospital readmission  Interventions:  Transitions of Care: Doctor Visits  - discussed the importance of doctor visits Communication with primary care provider re: enrollment in the 30 day TOC program Encouraged deep breathing and coughing every 2 hours to mobilize secretions  Monitor oxygen level with oximeter to ensure oxygen level in the 90's. Report any symptoms of shortness of breath and or low oxygen levels less than 90% to provider.   Seek emergency medical services for severe symptoms.  Take antibiotics until completed Take in 8-10 glasses / day of water/ fluid to thin secretions  Reviewed upcoming provider visits Medications reviewed and compliance discussed/ advised. Advised to call and scheduled hospital follow up visit with primary care provider  11/26/24 Reviewed Discharge wound care: Foam dressing  Every 3 days     Comments: Mepilex foam dressing to affected buttock, patient to change 11/27/24 he states, and verbalizes understanding.  Patient Self Care Activities:  Attend all scheduled provider appointments Call pharmacy for medication refills 3-7 days in advance of running out of medications Call provider office for new concerns or questions  Notify RN Care Manager of TOC call rescheduling needs Participate in Transition of Care Program/Attend TOC scheduled calls Take medications as prescribed   Stay hydrated drinking at least 8-10 glasses of water/ fluid daily Call and schedule a hospital follow up visit with your primary care provider Take your antibiotics until completed Contact your provider for any new/ ongoing symptoms Seek emergency medical services for severe symptoms.   Plan:  Patient readmitted to the hospital on 11/20/24.  Goals closed.   11/26/24 Reactivated care plan - pneumonia; right abscess to buttocks - upcoming pulmonology appointment as well, patient desires same [facility] nurse. 11/26/24 Discussed and offered 30 day TOC program.  Patient accepts to restart program and states with the [facility] RN he started with last week.  The patient has been provided with contact information for the care management team and has been advised to  call with any health -related questions or concerns.  The patient verbalized understanding with current plan of care.  The patient was placed on Davina Green's schedule for Tuesday 12/04/24 at 10:00 AM        Patient verbalizes understanding of instructions and care plan  provided today and agrees to view in MyChart. Active MyChart status and patient understanding of how to access instructions and care plan via MyChart confirmed with patient.    Patient states he reviews MyChart religiously.  The patient has been provided with contact information for the care management team and has been advised to call with any health related questions or concerns.   Please call the care guide team at [phone] if you need to cancel or reschedule your appointment.   Please call the [place]  National Suicide Prevention Lifeline: (979)515-0806 or TTY: 517 029 2440 TTY [phone]) to talk to a trained counselor if you are experiencing a Mental Health or Behavioral Health Crisis or need someone to talk to.  Richerd Fish, RN, BSN, CCM [facility], [facility] Management Coordinator Direct Dial: [phone]

## 2024-11-27 ENCOUNTER — Inpatient Hospital Stay: Payer: MEDICARE

## 2024-11-27 ENCOUNTER — Other Ambulatory Visit: Payer: Self-pay

## 2024-11-27 ENCOUNTER — Inpatient Hospital Stay: Payer: MEDICARE | Admitting: Nurse Practitioner

## 2024-11-27 VITALS — BP 142/71 | HR 89 | Temp 97.8°F | Resp 18 | Ht 71.0 in | Wt 171.9 lb

## 2024-11-27 DIAGNOSIS — Z7901 Long term (current) use of anticoagulants: Secondary | ICD-10-CM | POA: Diagnosis not present

## 2024-11-27 DIAGNOSIS — R630 Anorexia: Secondary | ICD-10-CM | POA: Diagnosis not present

## 2024-11-27 DIAGNOSIS — C9110 Chronic lymphocytic leukemia of B-cell type not having achieved remission: Secondary | ICD-10-CM

## 2024-11-27 DIAGNOSIS — K5900 Constipation, unspecified: Secondary | ICD-10-CM | POA: Diagnosis not present

## 2024-11-27 DIAGNOSIS — N529 Male erectile dysfunction, unspecified: Secondary | ICD-10-CM | POA: Diagnosis not present

## 2024-11-27 DIAGNOSIS — R5383 Other fatigue: Secondary | ICD-10-CM | POA: Diagnosis not present

## 2024-11-27 DIAGNOSIS — K625 Hemorrhage of anus and rectum: Secondary | ICD-10-CM | POA: Diagnosis not present

## 2024-11-27 DIAGNOSIS — R110 Nausea: Secondary | ICD-10-CM | POA: Diagnosis not present

## 2024-11-27 DIAGNOSIS — D696 Thrombocytopenia, unspecified: Secondary | ICD-10-CM | POA: Diagnosis not present

## 2024-11-27 LAB — CBC WITH DIFFERENTIAL (CANCER CENTER ONLY)
Abs Immature Granulocytes: 0.06 K/uL (ref 0.00–0.07)
Basophils Absolute: 0 K/uL (ref 0.0–0.1)
Basophils Relative: 0 %
Eosinophils Absolute: 0.3 K/uL (ref 0.0–0.5)
Eosinophils Relative: 1 %
HCT: 32.4 % — ABNORMAL LOW (ref 39.0–52.0)
Hemoglobin: 10.3 g/dL — ABNORMAL LOW (ref 13.0–17.0)
Immature Granulocytes: 0 %
Lymphocytes Relative: 95 %
Lymphs Abs: 53.2 K/uL — ABNORMAL HIGH (ref 0.7–4.0)
MCH: 31.9 pg (ref 26.0–34.0)
MCHC: 31.8 g/dL (ref 30.0–36.0)
MCV: 100.3 fL — ABNORMAL HIGH (ref 80.0–100.0)
Monocytes Absolute: 0.4 K/uL (ref 0.1–1.0)
Monocytes Relative: 1 %
Neutro Abs: 1.8 K/uL (ref 1.7–7.7)
Neutrophils Relative %: 3 %
Platelet Count: 147 K/uL — ABNORMAL LOW (ref 150–400)
RBC: 3.23 MIL/uL — ABNORMAL LOW (ref 4.22–5.81)
RDW: 15.9 % — ABNORMAL HIGH (ref 11.5–15.5)
WBC Count: 55.7 K/uL (ref 4.0–10.5)
nRBC: 0 % (ref 0.0–0.2)

## 2024-11-27 LAB — PROTIME-INR
INR: 1.3 — ABNORMAL HIGH (ref 0.8–1.2)
Prothrombin Time: 16.8 s — ABNORMAL HIGH (ref 11.4–15.2)

## 2024-11-27 MED ORDER — CALQUENCE 100 MG PO TABS
100.0000 mg | ORAL_TABLET | Freq: Two times a day (BID) | ORAL | 0 refills | Status: DC
  Filled 2024-11-27: qty 60, 30d supply, fill #0

## 2024-11-27 NOTE — Progress Notes (Signed)
 " [facility] OFFICE PROGRESS NOTE   Diagnosis: CLL  INTERVAL HISTORY:   Mr. Dall returns for follow-up.  He continues acalabrutinib .  No fever or chills since hospital discharge.  Right buttock is less painful.  He denies bleeding.  No arthralgias.  No nausea/vomiting/diarrhea.  No longer having issues with confusion.  Objective:  Vital signs in last 24 hours:  Blood pressure (!) 142/71, pulse 89, temperature 97.8 F (36.6 C), temperature source Temporal, resp. rate 18, height 5' 11 (1.803 m), weight 171 lb 14.4 oz (78 kg), SpO2 98%.    HEENT: No thrush or ulcers. Lymphatics: Small bilateral axillary lymph nodes. Resp: Lungs clear bilaterally. Cardio: Regular rate and rhythm. GI: Firm mass mid abdomen just to the left of midline. Vascular: No leg edema. Neuro: Alert and oriented. Skin: Approximate 3 cm lesion right buttock, appears dry and superficially ulcerated.  Bilateral buttock regions with dusky erythema, dryness.   Lab Results:  Lab Results  Component Value Date   WBC 55.7 (HH) 11/27/2024   HGB 10.3 (L) 11/27/2024   HCT 32.4 (L) 11/27/2024   MCV 100.3 (H) 11/27/2024   PLT 147 (L) 11/27/2024   NEUTROABS 1.8 11/27/2024    Imaging:  No results found.  Medications: I have reviewed the patient's current medications.  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 He will return for a monthly PT check.  Indwelling IVC catheter.  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        4. 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 erectile dysfunction-followed by Dr. Watt , status post placement of a penile implant at [facility] in April 2014            7. CT of the abdomen/pelvis at [facility] urology February 2014 for evaluation of hematuria-12 mm external iliac nodes and left periaortic retroperitoneal lymph node-11 mm-potentially  related to CLL      8.  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, peripheral lymphocytosis and small palpable lymph nodes          CT abdomen/pelvis 09/18/2019-extensive abdominal pelvic lymphadenopathy including a left periaortic node measuring 5.7 cm 08/13/2020 CLL FISH panel-no evidence of trisomy 12; no evidence of [id number]; no evidence of p53 deletion or amplification; deletion of ATM present 11/05/2020-peripheral blood TP53 mutation-negative Cycle 1 bendamustine /Rituxan  10/08/2020, 10/09/2020 Cycle 2 bendamustine /Rituxan  11/05/2020, 11/06/2020 Cycle 3 bendamustine /Rituxan  12/03/2020, 12/04/2020 Cycle 4 bendamustine /Rituxan  12/31/2020, 01/01/2021 CTs 01/19/2021-decrease in bulky abdomen/pelvic lymphadenopathy with more widespread ill-defined haziness in the mesentery and retroperitoneal fat, decreased pelvic lymphadenopathy Cycle 5 bendamustine /Rituxan  01/28/2021, 01/29/2021 Cycle 6 bendamustine /rituximab  02/25/2021, 02/26/2021 CT 02/22/2023-mild generalized lymphadenopathy in the neck, significant increase in chest andabdominal/pelvic lymph nodes, dominant nodal mass in the mesentery CT 06/25/2023-progressive lymphadenopathy in the abdomen and pelvis, dominant confluent mass in the upper abdomen/retroperitoneum has enlarged, stable small nodes in the chest Cycle 1 Bendamustine /Rituxan  07/07/2023, 07/08/2023 Cycle 2 Bendamustine /Rituxan  08/08/2023, 08/09/2023 CT abdomen/pelvis 08/30/2023-bulky matted lymphadenopathy and soft tissue mass encasing retroperitoneal structures and extending into the small bowel mesentery slightly diminished in size.  Additional matted gastrohepatic ligament, portacaval, porta hepatis, bilateral iliac and inguinal lymph nodes also slightly diminished in size.  Spleen measures smaller. Cycle 3 Bendamustine /Rituxan  09/05/2023, 09/06/2023 Cycle 4 Bendamustine /Rituxan  10/03/2023, 10/04/2023 10/23/2024 CTs: New moderate right pleural  effusion, progressive mediastinal and axillary nodes, confluent abdominal/retroperitoneal adenopathy with a mass at  the level of the kidneys measuring 19.1 cm, marked bilateral pelvic sidewall adenopathy Trial of acalabrutinib  11/07/2024, stopped 11/11/2024 when hospitalized, resumed 11/15/2024     9.  Basal cell carcinoma removed from the left superior central forehead 01/21/2020    10.  Renal insufficiency    11.  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  Indeterminate left hepatic lobe lesion on CT 02/22/2023 13.  Fever and altered mental status following cycle 2 Cemiplimab -no source for infection identified 14.  Hypercalcemia-Zometa  10/30/2024 15.  Admission 11/11/2024 through 11/15/2024 with pneumonia, E. coli bacteremia 16.  Admission 11/20/2024 with erythema/pain at the right gluteal region, report of fever and chills 11/20/2024 CT pelvis: Soft tissue thickening at the medial right buttock with inflammatory stranding extending into the perineum, no drainable fluid collection, confluent abdomen/pelvic adenopathy  Disposition: Mr. Buchanon appears stable.  He continues acalabrutinib  for CLL.  CBC and PT/INR reviewed.  Coumadin  remains subtherapeutic.  He will continue Lovenox  as he is currently taking, Coumadin  at the current dose, return for follow-up PT/INR on 12/01/2023.  The lesion at the right buttock  appears improved.  He is completing a course of Augmentin  and doxycycline .  He will return for lab and follow-up in approximately 2 weeks.  We are available to see him sooner if needed.  Patient seen with Dr. Cloretta.  Olam Ned ANP/GNP-BC   11/27/2024  9:08 AM  This was a shared visit with Olam Ned.  Mr. Recupero was interviewed and examined.  The erythema/induration at the buttock appears improved.  He will complete the planned course of antibiotics.  He is maintained on Lovenox  and Coumadin .  He will continue Lovenox  until the PT/INR is therapeutic.  The rise in the lymphocyte count is likely secondary to acalabrutinib  as opposed to a marker of disease progression.  Arvella Cloretta, MD      "

## 2024-11-28 ENCOUNTER — Telehealth: Payer: Self-pay | Admitting: Dietician

## 2024-11-28 NOTE — Telephone Encounter (Signed)
 Patient requested a nutrition referral. First attempt to reach. He was unavailable today.  Set up remote appointment next week. Provided my contact info and appointment details in text.  Micheline Craven, RDN, LDN Registered Dietitian, [facility] Part Time Remote (Usual office hours: Tuesday-Thursday) Cell: [phone]

## 2024-11-30 ENCOUNTER — Encounter: Payer: Self-pay | Admitting: Oncology

## 2024-11-30 ENCOUNTER — Telehealth: Payer: Self-pay | Admitting: *Deleted

## 2024-11-30 ENCOUNTER — Inpatient Hospital Stay: Payer: MEDICARE | Attending: Oncology

## 2024-11-30 DIAGNOSIS — C9110 Chronic lymphocytic leukemia of B-cell type not having achieved remission: Secondary | ICD-10-CM

## 2024-11-30 DIAGNOSIS — Z7901 Long term (current) use of anticoagulants: Secondary | ICD-10-CM

## 2024-11-30 LAB — PROTIME-INR
INR: 1.4 — ABNORMAL HIGH (ref 0.8–1.2)
Prothrombin Time: 18.1 s — ABNORMAL HIGH (ref 11.4–15.2)

## 2024-11-30 NOTE — Telephone Encounter (Signed)
 Called Chad Gross to inform him his INR is not yet therapeutic, so he needs to stay on Lovenox  80 mg every 12 hours per NP directions. Continue his warfarin at 10 mg daily, except 12.5 mg M & Th. Will recheck his INR on 12/03/24 and go from there.

## 2024-12-02 ENCOUNTER — Encounter: Payer: Self-pay | Admitting: Nurse Practitioner

## 2024-12-02 ENCOUNTER — Other Ambulatory Visit (HOSPITAL_BASED_OUTPATIENT_CLINIC_OR_DEPARTMENT_OTHER): Payer: Self-pay | Admitting: Family Medicine

## 2024-12-02 DIAGNOSIS — I10 Essential (primary) hypertension: Secondary | ICD-10-CM

## 2024-12-03 ENCOUNTER — Telehealth: Payer: Self-pay | Admitting: *Deleted

## 2024-12-03 ENCOUNTER — Ambulatory Visit (HOSPITAL_BASED_OUTPATIENT_CLINIC_OR_DEPARTMENT_OTHER): Payer: MEDICARE | Admitting: Pulmonary Disease

## 2024-12-03 ENCOUNTER — Encounter (HOSPITAL_BASED_OUTPATIENT_CLINIC_OR_DEPARTMENT_OTHER): Payer: Self-pay | Admitting: Pulmonary Disease

## 2024-12-03 ENCOUNTER — Inpatient Hospital Stay (HOSPITAL_BASED_OUTPATIENT_CLINIC_OR_DEPARTMENT_OTHER): Payer: MEDICARE | Admitting: Nurse Practitioner

## 2024-12-03 ENCOUNTER — Encounter: Payer: Self-pay | Admitting: Oncology

## 2024-12-03 ENCOUNTER — Other Ambulatory Visit: Payer: Self-pay

## 2024-12-03 ENCOUNTER — Telehealth: Payer: Self-pay

## 2024-12-03 ENCOUNTER — Inpatient Hospital Stay: Payer: MEDICARE

## 2024-12-03 ENCOUNTER — Encounter: Payer: Self-pay | Admitting: Nurse Practitioner

## 2024-12-03 ENCOUNTER — Encounter (HOSPITAL_BASED_OUTPATIENT_CLINIC_OR_DEPARTMENT_OTHER): Payer: Self-pay | Admitting: Family Medicine

## 2024-12-03 VITALS — BP 118/63 | HR 82 | Wt 170.5 lb

## 2024-12-03 VITALS — BP 118/63 | HR 82 | Temp 98.1°F | Resp 18 | Ht 71.0 in | Wt 170.8 lb

## 2024-12-03 DIAGNOSIS — J42 Unspecified chronic bronchitis: Secondary | ICD-10-CM

## 2024-12-03 DIAGNOSIS — C9110 Chronic lymphocytic leukemia of B-cell type not having achieved remission: Secondary | ICD-10-CM

## 2024-12-03 DIAGNOSIS — J90 Pleural effusion, not elsewhere classified: Secondary | ICD-10-CM | POA: Diagnosis not present

## 2024-12-03 DIAGNOSIS — R8290 Unspecified abnormal findings in urine: Secondary | ICD-10-CM

## 2024-12-03 DIAGNOSIS — Z7901 Long term (current) use of anticoagulants: Secondary | ICD-10-CM

## 2024-12-03 LAB — URINALYSIS, COMPLETE (UACMP) WITH MICROSCOPIC
Bacteria, UA: NONE SEEN
Bilirubin Urine: NEGATIVE
Glucose, UA: NEGATIVE mg/dL
Hgb urine dipstick: NEGATIVE
Ketones, ur: NEGATIVE mg/dL
Leukocytes,Ua: NEGATIVE
Nitrite: NEGATIVE
Protein, ur: 30 mg/dL — AB
Specific Gravity, Urine: 1.025 (ref 1.005–1.030)
pH: 6 (ref 5.0–8.0)

## 2024-12-03 LAB — PROTIME-INR
INR: 1.3 — ABNORMAL HIGH (ref 0.8–1.2)
Prothrombin Time: 16.9 s — ABNORMAL HIGH (ref 11.4–15.2)

## 2024-12-03 MED ORDER — FUROSEMIDE 20 MG PO TABS: 20.0000 mg | ORAL_TABLET | Freq: Every day | ORAL | 0 refills | Status: AC

## 2024-12-03 MED ORDER — ALBUTEROL SULFATE HFA 108 (90 BASE) MCG/ACT IN AERS: 1.0000 | INHALATION_SPRAY | Freq: Four times a day (QID) | RESPIRATORY_TRACT | 1 refills | Status: AC | PRN

## 2024-12-03 NOTE — Patient Instructions (Signed)
 Chronic bronchitis, unspecified chronic bronchitis type (HCC) -Albuterol  TWO puffs in the morning and evening -Will evaluate in 6 weeks  Pleural effusion on right -In-clinic ultrasound of right thorax which showed persistent right small pleural effusion -Will lasix  20 mg x 3 days  CLL (chronic lymphocytic leukemia) (HCC) -On acalabrutinib  per Oncology -Will message Dr. Cloretta regarding plan

## 2024-12-03 NOTE — Telephone Encounter (Signed)
 Unable to reach patient, contacted Amy significant other in regards to mychart message being sent. Amy stated that this morning she noticed on patients upper left buttocks a  dark purple hard spot that is approximately the size of a 50 cent piece. Stated that this area is new, also mentioned the lesions on the right buttocks was improving and now has also turned dark. Patient receiving the Lovenox  injections have been leaving a larger than normal bruise on just the left side of abdomen, none on the right side of the abdominal area. Made aware would contact patient to see if any other symptoms are present. Reached patient, stated urine has had a stronger than usual smell which started 4 days ago, denies any burning/ stinging when urinating. Patient denies having blood in urine,  SOB, coughing, fever, dark tarry stools. States having a lab appointment today at 2pm, voiced wanting to see Olam afterwards. Made aware would make NP aware of concerns, then follow back up. Understood.   Patient notified via cell phone/ mychart message per Olam NP office visit at 3 pm will be scheduled for today, confirmed/ understood.

## 2024-12-03 NOTE — Progress Notes (Signed)
 " [facility] OFFICE PROGRESS NOTE   Diagnosis: CLL  INTERVAL HISTORY:   [name] is seen prior to scheduled follow-up for evaluation of a new spot at the upper left buttock.  The area is not tender.  He is not aware of any trauma.  The right buttock lesion continues to feel better with no associated pain.  He continues Coumadin /Lovenox .  He is not aware of any bleeding.  He takes his final antibiotic dose today.  No fever.  No further issues with confusion.  Only complaint is urine with a foul odor.  He continues acalabrutinib .  Objective:  Vital signs in last 24 hours:  Blood pressure 118/63, pulse 82, temperature 98.1 F (36.7 C), temperature source Temporal, resp. rate 18, height 5' 11 (1.803 m), weight 170 lb 12.8 oz (77.5 kg), SpO2 98%.    HEENT: No thrush or ulcers. Resp: Lungs clear bilaterally. Cardio: Regular rate and rhythm. GI: Firm mass mid abdomen just to left of midline, associated tenderness. Vascular: No leg edema. Skin: Approximate 3 cm dry appearing lesion right buttock with a small area of superficial ulceration, nontender.  Left low back/upper buttock region with dark purple discoloration measuring approximately 3 cm, appears consistent with ecchymosis.   Lab Results:  Lab Results  Component Value Date   WBC 55.7 (HH) 11/27/2024   HGB 10.3 (L) 11/27/2024   HCT 32.4 (L) 11/27/2024   MCV 100.3 (H) 11/27/2024   PLT 147 (L) 11/27/2024   NEUTROABS 1.8 11/27/2024    Imaging:  No results found.  Medications: I have reviewed the patient's current medications.  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 He will return for a monthly PT check.  Indwelling IVC catheter.  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        4. 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 erectile dysfunction-followed by Dr. Watt ,  status post placement of a penile implant at [facility] in April 2014            7. CT of the abdomen/pelvis at [facility] urology February 2014 for evaluation of hematuria-12 mm external iliac nodes and left periaortic retroperitoneal lymph node-11 mm-potentially related to CLL      8.  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, peripheral lymphocytosis and small palpable lymph nodes          CT abdomen/pelvis 09/18/2019-extensive abdominal pelvic lymphadenopathy including a left periaortic node measuring 5.7 cm 08/13/2020 CLL FISH panel-no evidence of trisomy 12; no evidence of [id number]; no evidence of p53 deletion or amplification; deletion of ATM present 11/05/2020-peripheral blood TP53 mutation-negative Cycle 1 bendamustine /Rituxan  10/08/2020, 10/09/2020 Cycle 2 bendamustine /Rituxan  11/05/2020, 11/06/2020 Cycle 3 bendamustine /Rituxan  12/03/2020, 12/04/2020 Cycle 4 bendamustine /Rituxan  12/31/2020, 01/01/2021 CTs 01/19/2021-decrease in bulky abdomen/pelvic lymphadenopathy with more widespread ill-defined haziness in the mesentery and retroperitoneal fat, decreased pelvic lymphadenopathy Cycle 5 bendamustine /Rituxan  01/28/2021, 01/29/2021 Cycle 6 bendamustine /rituximab  02/25/2021, 02/26/2021 CT 02/22/2023-mild generalized lymphadenopathy in the neck, significant increase in chest andabdominal/pelvic lymph nodes, dominant nodal mass in the mesentery CT 06/25/2023-progressive lymphadenopathy in the abdomen and pelvis, dominant confluent mass in the upper abdomen/retroperitoneum has enlarged, stable small nodes in the chest Cycle 1 Bendamustine /Rituxan  07/07/2023, 07/08/2023 Cycle 2 Bendamustine /Rituxan  08/08/2023, 08/09/2023 CT abdomen/pelvis 08/30/2023-bulky matted lymphadenopathy and soft tissue mass encasing retroperitoneal structures and extending into the small bowel mesentery slightly diminished in size.  Additional matted gastrohepatic ligament, portacaval, porta  hepatis, bilateral iliac and inguinal lymph nodes also slightly diminished in size.  Spleen measures smaller. Cycle 3 Bendamustine /Rituxan  09/05/2023, 09/06/2023 Cycle 4 Bendamustine /Rituxan  10/03/2023, 10/04/2023 10/23/2024 CTs: New moderate right pleural effusion, progressive mediastinal and axillary nodes, confluent abdominal/retroperitoneal adenopathy with a mass at the level of the kidneys measuring 19.1 cm, marked bilateral pelvic sidewall adenopathy Trial of acalabrutinib  11/07/2024, stopped 11/11/2024 when hospitalized, resumed 11/15/2024     9.  Basal cell carcinoma removed from the left superior central forehead 01/21/2020    10.  Renal insufficiency    11.  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  Indeterminate left hepatic lobe lesion on CT 02/22/2023 13.  Fever and altered mental status following cycle 2 Cemiplimab -no source for infection identified 14.  Hypercalcemia-Zometa  10/30/2024 15.  Admission 11/11/2024 through 11/15/2024 with pneumonia, E. coli bacteremia 16.  Admission 11/20/2024 with erythema/pain at the right gluteal region, report of fever and chills 11/20/2024 CT pelvis: Soft tissue thickening at the medial right buttock with inflammatory stranding extending into the perineum, no drainable fluid collection, confluent abdomen/pelvic adenopathy    Disposition: Chad Gross appears stable.   He continues acalabrutinib  for CLL.  He has a history of recurrent venous thromboembolism, maintained on chronic Coumadin  anticoagulation.  PT/INR remains subtherapeutic.  He will increase the dose of Coumadin  to 12.5 mg Monday/Wednesday/Friday, 10 mg other days.  Continue Lovenox .  Return for follow-up PT/INR 12/07/2024.  The new lesion at the left upper buttock appears consistent with ecchymosis.  He will continue to monitor, call with any increase/change.  He will submit a urine specimen today due to complaint of foul odor.  Next scheduled office visit 12/13/2024.  We are available to see him sooner if needed.    Chad Gross ANP/GNP-BC   12/03/2024  3:02 PM        "

## 2024-12-03 NOTE — Progress Notes (Signed)
 "   Subjective:   PATIENT ID: Chad Gross GENDER: male DOB: 02/14/1946, MRN: [id number]  Chief Complaint  Patient presents with   Consult    Shortness of breath    Reason for Visit: New consult for shortness of breath with activity     Chad Gross is a 79 y.o. male with CLL, CAD, HTN, HLD recurrent DVT/hx PE, SCC of scalp who presents for evaluation for shortness of breath.  Social History: Never smoker  Environmental exposures:      12/03/2024 Discussed the use of AI scribe software for clinical note transcription with the patient, who gave verbal consent to proceed.  History of Present Illness Chad Gross is a 79 year old male with chronic kidney disease stage 3 and chronic lymphocytic leukemia who presents with shortness of breath with activity. He was referred by his primary care doctor for evaluation of shortness of breath and mucus production.  He has been experiencing shortness of breath with activity for the past two months, initially leading to a hospitalization for pneumonia and E. coli bacteremia. The shortness of breath has improved by about 50% since his hospital discharge, but he still experiences it after walking two to three blocks, especially when walking his dog. No wheezing is present, but he mentions a change in his voice, describing it as 'raspy' and quieter, particularly noticeable when talking on the phone. He has not used inhalers in the past and denies any history of smoking or exposure to environmental irritants.  He was hospitalized from December 14 to December 18 for sepsis secondary to pneumonia with a right pleural effusion and E. coli bacteremia. During this time, he also experienced acute kidney injury on top of his chronic kidney disease stage 3. He was treated with antibiotics and fluids. He reports that a chest x-ray was performed on December 23, which showed a small amount of residual pleural effusion.  He was  readmitted from December 23 to December 26 for a right buttock abscess, which was treated with antibiotics. He mentions ongoing pain in the area and is concerned about the use of warfarin. The abscess was not related to warfarin-induced necrosis.  He has a history of chronic lymphocytic leukemia (CLL) and is currently on acalabrutinib , taking two pills daily. He has previously been treated with Rituxan  and mentions having undergone IV treatments in the past. His white blood cell count has increased from 37 to 55, and he is scheduled for further lab work today.  He reports occasional phlegm production, primarily in the mornings, which has improved recently. No nasal congestion, chronic runny nose, or significant allergy issues, although he takes Zyrtec for allergies. He also reports occasional reflux symptoms but denies regular occurrences.  He has experienced urinary incontinence issues, particularly during his recent illness, but does not report ongoing problems. He mentions a decrease in energy and appetite, although both are improving. He has lost weight since his illness.     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   Clotting disorder 11/29/62   Have had three episodes of DVTs   ED (erectile dysfunction)    History of DVT (deep vein thrombosis)    Hyperlipidemia    Hypertension    Pericarditis    Pulmonary embolism (HCC)      Family History  Problem Relation Age of Onset  Breast cancer Mother    Hypertension Mother    Arthritis Mother    Cancer Mother    Heart failure Father    Pneumonia Father    Hypertension Father    Stroke Maternal Uncle      Social History   Occupational History   Not on file  Tobacco Use   Smoking status: Never    Passive exposure: Never   Smokeless tobacco: Never  Vaping Use   Vaping status: Never Used  Substance  and Sexual Activity   Alcohol use: Yes    Comment: rare   Drug use: No   Sexual activity: Yes    Allergies[1]   Outpatient Medications Prior to Visit  Medication Sig Dispense Refill   acalabrutinib  maleate (CALQUENCE ) 100 MG tablet Take 1 tablet (100 mg total) by mouth 2 (two) times daily. 60 tablet 0   acetaminophen  (TYLENOL ) 500 MG tablet Take 500-1,000 mg by mouth every 6 (six) hours as needed (for pain or headaches).     amoxicillin -clavulanate (AUGMENTIN ) 875-125 MG tablet Take 1 tablet by mouth 2 (two) times daily for 10 days. 20 tablet 0   Coenzyme Q10 (COQ10) 100 MG CAPS Take 100 mg by mouth daily.     doxycycline  (VIBRA -TABS) 100 MG tablet Take 1 tablet (100 mg total) by mouth 2 (two) times daily for 10 days. 20 tablet 0   Ensure Plus (ENSURE PLUS) LIQD Take 237 mLs by mouth See admin instructions. Drink 237 ml's of either STRAWBERRY or CHOCOLATE by mouth one to two times a day     GLUCOSAMINE CHONDROITIN COMPLX PO Take 1 tablet by mouth daily.     losartan  (COZAAR ) 50 MG tablet TAKE 1 TABLET BY MOUTH EVERY DAY 90 tablet 3   meclizine  (ANTIVERT ) 12.5 MG tablet Take 1 tablet (12.5 mg total) by mouth 3 (three) times daily as needed for dizziness. 60 tablet 0   Multiple Vitamins-Minerals (MENS MULTIVITAMIN) TABS Take 1 tablet by mouth daily with supper.     pravastatin  (PRAVACHOL ) 40 MG tablet TAKE 1 TABLET BY MOUTH EVERY DAY (Patient taking differently: Take 40 mg by mouth at bedtime.) 90 tablet 3   traMADol  (ULTRAM ) 50 MG tablet Take 1 tablet (50 mg total) by mouth every 6 (six) hours as needed for moderate pain or severe pain. 30 tablet 0   triamcinolone  acetonide 40 MG/ML SUSP 40 mg, mupirocin cream 2 % CREA 15 g Apply 1 application  topically as needed (for irritation- face).     triamcinolone  cream (KENALOG ) 0.1 % Apply 1 Application topically 2 (two) times daily as needed. (Patient taking differently: Apply 1 Application topically 2 (two) times daily as needed (fotr itching).) 30  g 0   warfarin (COUMADIN ) 10 MG tablet TAKE 1 TABLET BY MOUTH DAILY AT 6:00 AM (Patient taking differently: Take 10 mg by mouth every evening.) 90 tablet 1   warfarin (COUMADIN ) 2.5 MG tablet TAKE 2.5 MG WITH 10 MG TABLET ON MONDAY & THURSDAY FOR DOSE OF 12.5 MG (Patient taking differently: Take 2.5 mg by mouth See admin instructions. Take 2.5 mg by mouth in the evening on ONLY Mondays and Thursdays- in conjunction with one 10 mg tablet *equals 12.5 mg*) 24 tablet 3   enoxaparin  (LOVENOX ) 80 MG/0.8ML injection Inject 0.8 mLs (80 mg total) into the skin every 12 (twelve) hours. (Patient not taking: Reported on 12/03/2024) 22.4 mL 0   hydrochlorothiazide  (HYDRODIURIL ) 12.5 MG tablet TAKE 1 TABLET BY MOUTH EVERY DAY (Patient not taking: Reported  on 11/27/2024) 90 tablet 3   No facility-administered medications prior to visit.    Review of Systems  Constitutional:  Negative for chills, diaphoresis, fever, malaise/fatigue and weight loss.  HENT:  Negative for congestion.   Respiratory:  Positive for cough and sputum production. Negative for hemoptysis, shortness of breath and wheezing.   Cardiovascular:  Negative for chest pain, palpitations and leg swelling.     Objective:   Vitals:   12/03/24 1303  BP: 118/63  Pulse: 82  SpO2: 98%  Weight: 170 lb 8 oz (77.3 kg)   SpO2: 98 %  Physical Exam GENERAL: Well appearing, no acute distress. HEAD EARS NOSE THROAT: Normocephalic, atraumatic. EYES: Extraocular movements intact, no scleral icterus. RESPIRATORY: Clear to auscultation on the left, right pleural effusion present, no crackles, wheezing or rales. CARDIOVASCULAR: Regular rate and rhythm, no murmurs, rubs, or gallops, no jugular venous distention. EXTREMITIES: No edema, no tenderness. NEUROLOGICAL: Alert and oriented x4, cranial nerves II-XII grossly intact. PSYCHIATRIC: Normal mood, normal affect.   Data Reviewed:  Imaging: CT CAP 10/23/24 - Visualized lung fields with moderate  right pleural effusion with interstitial thickening bilateral, chronic right pleural thickening CXR 11/20/24 - Right lower lobe and left base airspace opacities  PFT: None on file  Labs: CBC    Component Value Date/Time   WBC 55.7 (HH) 11/27/2024 0840   WBC 37.5 (H) 11/23/2024 0554   RBC 3.23 (L) 11/27/2024 0840   HGB 10.3 (L) 11/27/2024 0840   HGB 13.7 09/24/2021 1125   HGB 14.1 06/12/2013 1348   HCT 32.4 (L) 11/27/2024 0840   HCT 41.2 09/24/2021 1125   HCT 41.1 06/12/2013 1348   PLT 147 (L) 11/27/2024 0840   PLT 155 09/24/2021 1125   MCV 100.3 (H) 11/27/2024 0840   MCV 94 09/24/2021 1125   MCV 90.1 06/12/2013 1348   MCH 31.9 11/27/2024 0840   MCHC 31.8 11/27/2024 0840   RDW 15.9 (H) 11/27/2024 0840   RDW 12.9 09/24/2021 1125   RDW 12.3 06/12/2013 1348   LYMPHSABS 53.2 (H) 11/27/2024 0840   LYMPHSABS 1.2 09/24/2021 1125   LYMPHSABS 3.3 06/12/2013 1348   MONOABS 0.4 11/27/2024 0840   MONOABS 0.5 06/12/2013 1348   EOSABS 0.3 11/27/2024 0840   EOSABS 0.4 09/24/2021 1125   BASOSABS 0.0 11/27/2024 0840   BASOSABS 0.0 09/24/2021 1125   BASOSABS 0.0 06/12/2013 1348      Latest Ref Rng & Units 11/27/2024    8:40 AM 11/23/2024    5:54 AM 11/22/2024   12:22 AM  CBC  WBC 4.0 - 10.5 K/uL 55.7  37.5  42.3   Hemoglobin 13.0 - 17.0 g/dL 89.6  8.6  8.4   Hematocrit 39.0 - 52.0 % 32.4  26.8  25.9   Platelets 150 - 400 K/uL 147  101  103     Absolute eos  11/27/24 - 300     Assessment & Plan:   Discussion: HPI  Assessment & Plan Chronic bronchitis, unspecified chronic bronchitis type [facility]) May be secondary to pneumonia. Improved after antibiotics. Persistent symptoms likely post-infectious. Will manage conservatively -Albuterol  TWO puffs in the morning and evening -Will evaluate in 6 weeks Pleural effusion on right -In-clinic ultrasound of right thorax which showed persistent right small pleural effusion -Will lasix  20 mg x 3 days. Caution with stage IIIA CKD CLL  (chronic lymphocytic leukemia) (HCC) -On acalabrutinib  per Oncology -Will message Dr. Cloretta regarding plan  Health Maintenance Immunization History  Administered Date(s) Administered   Fluad   Quad(high Dose 65+) 08/15/2019, 08/13/2020, 09/24/2021, 08/16/2022   Fluad  Trivalent(High Dose 65+) 08/08/2023   INFLUENZA, HIGH DOSE SEASONAL PF 08/15/2018, 09/25/2024   Influenza,inj,quad, With Preservative 10/17/2013, 08/30/2014, 08/30/2015, 10/14/2016, 08/17/2017   PFIZER Comirnaty [place] Top)Covid-19 Tri-Sucrose Vaccine 03/25/2021   PFIZER(Purple Top)SARS-COV-2 Vaccination 12/23/2019, 01/14/2020, 07/22/2020   Pfizer(Comirnaty )Fall Seasonal Vaccine 12 years and older 09/23/2022, 08/30/2023   Pneumococcal Conjugate-13 05/19/2015, 01/06/2018   Pneumococcal Polysaccharide-23 11/29/2009, 12/13/2018   Respiratory Syncytial Virus Vaccine ,Recomb Aduvanted(Arexvy ) 11/15/2022   Tdap 05/19/2015   Zoster Recombinant(Shingrix) 06/20/2019, 09/12/2019   Zoster, Live 11/30/2011   CT Lung Screen - never smoker   No orders of the defined types were placed in this encounter.  Meds ordered this encounter  Medications   albuterol  (VENTOLIN  HFA) 108 (90 Base) MCG/ACT inhaler    Sig: Inhale 1-2 puffs into the lungs every 6 (six) hours as needed for wheezing or shortness of breath.    Dispense:  18 g    Refill:  1   furosemide  (LASIX ) 20 MG tablet    Sig: Take 1 tablet (20 mg total) by mouth daily.    Dispense:  3 tablet    Refill:  0    Return in about 6 weeks (around 01/14/2025).  I have spent a total time of 45-minutes on the day of the appointment reviewing prior documentation, coordinating care and discussing medical diagnosis and plan with the patient/family. Imaging, labs and tests included in this note have been reviewed and interpreted independently by me. This note is generated using [employer] programming. Patient/family has given consent.  Leeanne Butters Slater Staff, MD [facility] 12/03/2024 1:22 PM        [1] No Known Allergies  "

## 2024-12-03 NOTE — Telephone Encounter (Signed)
 Notified Mr. Claros that per NP, UA looks OK. Will f/u on culture.

## 2024-12-03 NOTE — Assessment & Plan Note (Signed)
-  On acalabrutinib  per Oncology -Will message Dr. Cloretta regarding plan

## 2024-12-04 ENCOUNTER — Encounter (HOSPITAL_BASED_OUTPATIENT_CLINIC_OR_DEPARTMENT_OTHER): Payer: Self-pay | Admitting: Pulmonary Disease

## 2024-12-04 ENCOUNTER — Other Ambulatory Visit: Payer: MEDICARE

## 2024-12-04 ENCOUNTER — Other Ambulatory Visit: Payer: Self-pay

## 2024-12-04 LAB — URINE CULTURE: Culture: NO GROWTH

## 2024-12-04 NOTE — Transitions of Care (Post Inpatient/ED Visit) (Unsigned)
 " Transition of Care week 2  Visit Note  12/05/2024  Name: Chad Gross MRN: [id number]          DOB: 01-27-1946  Situation: Patient enrolled in [facility] 30-day program. Visit completed with patient by telephone.   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   Clotting disorder 11/29/62   Have had three episodes of DVTs   ED (erectile dysfunction)    History of DVT (deep vein thrombosis)    Hyperlipidemia    Hypertension    Pericarditis    Pulmonary embolism (HCC)     Assessment: Patient Reported Symptoms: Cognitive Cognitive Status: No symptoms reported, Alert and oriented to person, place, and time, Insightful and able to interpret abstract concepts, Normal speech and language skills      Neurological Neurological Review of Symptoms: No symptoms reported    HEENT HEENT Symptoms Reported: Other: HEENT Management Strategies: Routine screening HEENT Comment: patient reports having ongoing symptoms of hoarsness and phelgm production.  Patient reports seeing his pulmonologist on 12/03/24.    Cardiovascular Cardiovascular Symptoms Reported: No symptoms reported    Respiratory Respiratory Symptoms Reported: Productive cough, Shortness of breath Additional Respiratory Details: patient reports having follow up visit with his pulmologist on 12/03/24. Reports starting albuterol  and lasix  as prescribed by pulmonologist. Respiratory Management Strategies: Medication therapy, Adequate rest, Routine screening  Endocrine Endocrine Symptoms Reported: No symptoms reported Is patient diabetic?: No    Gastrointestinal Gastrointestinal Symptoms Reported: No symptoms reported      Genitourinary Genitourinary Symptoms Reported:  Other Other Genitourinary Symptoms: patient reports having urine odor.  He states he reported this to his oncologist and was advised to provide urine sample for further evaluation    Integumentary Integumentary Symptoms Reported: Other, Bruising Other Integumentary Symptoms: patient reports having a spot to his left upper buttock area that is being monitored by his oncologist.  Patient reports having some bruising to his abdomen due to levenox injections. Skin Management Strategies: Routine screening  Musculoskeletal Musculoskelatal Symptoms Reviewed: Weakness Additional Musculoskeletal Details: patient states home health PT  to resume on 12/05/24.  Patient reports drinking 1-2 protein drinks per day Musculoskeletal Management Strategies:  (home health services) Falls in the past year?: No Number of falls in past year: 1 or less Was there an injury with Fall?: No Fall Risk Category Calculator: 0 Patient Fall Risk Level: Low Fall Risk    Psychosocial Psychosocial Symptoms Reported: No symptoms reported         There were no vitals filed for this visit. Pain Scale: 0-10 Pain Score: 0-No pain  Medications Reviewed Today     Reviewed by Desteny Freeman E, RN (Registered Nurse) on 12/04/24 at 1052  Med List Status: <None>   Medication Order Taking? Sig Documenting Provider Last Dose Status Informant  acalabrutinib  maleate (CALQUENCE ) 100 MG tablet [id number] Yes Take 1 tablet (100 mg total) by mouth 2 (two) times daily. Cloretta Arley NOVAK, MD  Active   acetaminophen  (TYLENOL ) 500 MG tablet [id number] Yes Take 500-1,000 mg by mouth every 6 (six) hours as needed (for pain or headaches). [provider]  Active Self  albuterol  (VENTOLIN  HFA) 108 (90 Base) MCG/ACT inhaler [id number]  Inhale 1-2 puffs into the lungs every 6 (six) hours  as needed for wheezing or shortness of breath.  Patient not taking: Reported on 12/03/2024   Kassie Acquanetta Bradley, MD  Active   Coenzyme Q10 (COQ10) 100 MG CAPS  [id number] Yes Take 100 mg by mouth daily. [provider]  Active Self  enoxaparin  (LOVENOX ) 80 MG/0.8ML injection [id number] Yes Inject 0.8 mLs (80 mg total) into the skin every 12 (twelve) hours. Ezenduka, Nkeiruka J, MD  Active   Ensure Plus (ENSURE PLUS) BERNICE [id number] Yes Take 237 mLs by mouth See admin instructions. Drink 237 ml's of either STRAWBERRY or CHOCOLATE by mouth one to two times a day [provider]  Active Self  furosemide  (LASIX ) 20 MG tablet [id number] Yes Take 1 tablet (20 mg total) by mouth daily.  Patient taking differently: Take 20 mg by mouth daily. To start this weekend   Kassie Acquanetta Bradley, MD  Active   GLUCOSAMINE CHONDROITIN COMPLX PO [id number] Yes Take 1 tablet by mouth daily. [provider]  Active Self  hydrochlorothiazide  (HYDRODIURIL ) 12.5 MG tablet [id number] Yes TAKE 1 TABLET BY MOUTH EVERY DAY [place], Quintin PARAS, MD  Active Self           Med Note LESLY, RICHERD CINDERELLA Kitchens Nov 26, 2024  9:47 AM) 11/26/24 Continue to pause until he sees Dr. Cloretta  losartan  (COZAAR ) 50 MG tablet [id number] Yes TAKE 1 TABLET BY MOUTH EVERY DAY [place], Quintin PARAS, MD  Active Self  meclizine  (ANTIVERT ) 12.5 MG tablet [id number] Yes Take 1 tablet (12.5 mg total) by mouth 3 (three) times daily as needed for dizziness. Cloretta Arley NOVAK, MD  Active Self  Multiple Vitamins-Minerals (MENS MULTIVITAMIN) TABS [id number] Yes Take 1 tablet by mouth daily with supper. [provider]  Active Self  pravastatin  (PRAVACHOL ) 40 MG tablet [id number] Yes TAKE 1 TABLET BY MOUTH EVERY DAY  Patient taking differently: Take 40 mg by mouth at bedtime.   de [place], Raymond J, MD  Active Self  traMADol  (ULTRAM ) 50 MG tablet [id number] Yes Take 1 tablet (50 mg total) by mouth every 6 (six) hours as needed for moderate pain or severe pain. [place], Quintin PARAS, MD  Active Self  triamcinolone  acetonide 40 MG/ML SUSP 40 mg, mupirocin cream 2 % CREA 15 g [id number] Yes Apply 1  application  topically as needed (for irritation- face). [provider]  Active Self  triamcinolone  cream (KENALOG ) 0.1 % [id number] Yes Apply 1 Application topically 2 (two) times daily as needed.  Patient taking differently: Apply 1 Application topically 2 (two) times daily as needed (fotr itching).   [place], Raymond J, MD  Active Self  warfarin (COUMADIN ) 10 MG tablet [id number] Yes TAKE 1 TABLET BY MOUTH DAILY AT 6:00 AM  Patient taking differently: Take 10 mg by mouth every evening.   Cloretta Arley NOVAK, MD  Active Self           Med Note RENNIS, DUROJAHYE' R   Mon Nov 12, 2024  2:43 AM)    warfarin (COUMADIN ) 2.5 MG tablet [id number] Yes TAKE 2.5 MG WITH 10 MG TABLET ON MONDAY & THURSDAY FOR DOSE OF 12.5 MG  Patient taking differently: Take 2.5 mg by mouth See admin instructions. Take 2.5 mg by mouth in the evening on MWF in conjunction with one 10 mg tablet *equals 12.5 mgDEWAINE Cloretta, Arley NOVAK, MD  Active Self           Med Note MARISA, NATHANEL LOISE [place] Nov 21, 2024  4:00 PM)    Med List Note Marisa Nathanel SAILOR, CPhT 11/21/24 1556): Calquence  filled at [facility]  Last name is SHUH-pell            Goals Addressed             This Visit's Progress    VBCI Transitions of Care (TOC) Care Plan       Goals - reactivated 11/26/24 11/26/24 Restarted Care Plan with ongoing follow up for pneumonia and currently with Abscess to right buttocks Problems:  Recent Hospitalization for treatment of community acquired pneumonia 11/26/24 Restart care plan - wound to right buttocks,    11/26/24 Earliest hospital follow up is noted for 12/24/24 with PCP; patient endorses follow up with Dr. Cloretta for most of his medical management at this time. He is anticipating a call from Dr. Andriette office for labs and follow up. Patient desires to keep current appointment. Patient has a follow up with Pulmonologist 12/03/2024 as well.  Goal:  Over the next 30 days, the patient  will not experience hospital readmission  Interventions:  Transitions of Care: Doctor Visits  - discussed the importance of doctor visits Communication with primary care provider re: enrollment in the 30 day TOC program Encouraged deep breathing and coughing every 2 hours to mobilize secretions  Monitor oxygen level with oximeter to ensure oxygen level in the 90's. Report any symptoms of shortness of breath and or low oxygen levels less than 90% to provider.  Seek emergency medical services for severe symptoms.  Take in 8-10 glasses / day of water/ fluid to thin secretions  Reviewed upcoming provider visits Medications reviewed and compliance discussed/ advised. 11/26/24 Reviewed Discharge wound care: Foam dressing  Every 3 days     Comments: Mepilex foam dressing to affected buttock, patient to change 11/27/24 he states, and verbalizes understanding. Assessed for restart of home health services Discussed signs/ symptoms of bleeding. Advised to notify provider of any signs of bleeding immediately. Fall precautions discussed- on warfarin/ levenox Reviewed and discussed pulmonology and oncology appointments from 12/03/24  Patient Self Care Activities:  Attend all scheduled provider appointments Call pharmacy for medication refills 3-7 days in advance of running out of medications Call provider office for new concerns or questions  Notify RN Care Manager of TOC call rescheduling needs Participate in Transition of Care Program/Attend TOC scheduled calls Take medications as prescribed   Stay hydrated drinking at least 8-10 glasses of water/ fluid daily Call and schedule a hospital follow up visit with your primary care provider Take your antibiotics until completed Contact your provider for any new/ ongoing symptoms Seek emergency medical services for severe symptoms.  Notify provider immediately for any signs of bleeding: blood in your urine, stool, excessive bruising, bleeding from  gums  Plan:  Telephone follow up appointment with care management team member scheduled for:  12/13/24 at 2 pm            Recommendation:   Continue Current Plan of Care  Follow Up Plan:   Telephone follow-up in 1 week  Arvin Seip RN, BSN, CCM [place]  [facility], Population Health Case Manager Phone: [phone]     "

## 2024-12-05 ENCOUNTER — Telehealth: Payer: Self-pay

## 2024-12-05 NOTE — Patient Instructions (Signed)
 Visit Information  Thank you for taking time to visit with me today. Please don't hesitate to contact me if I can be of assistance to you before our next scheduled telephone appointment.  Our next appointment is by telephone on 12/13/24 at 2 pm  Following is a copy of your care plan:   Goals Addressed             This Visit's Progress    VBCI Transitions of Care (TOC) Care Plan       Goals - reactivated 11/26/24 11/26/24 Restarted Care Plan with ongoing follow up for pneumonia and currently with Abscess to right buttocks Problems:  Recent Hospitalization for treatment of community acquired pneumonia 11/26/24 Restart care plan - wound to right buttocks,    11/26/24 Earliest hospital follow up is noted for 12/24/24 with PCP; patient endorses follow up with Dr. Cloretta for most of his medical management at this time. He is anticipating a call from Dr. Andriette office for labs and follow up. Patient desires to keep current appointment. Patient has a follow up with Pulmonologist 12/03/2024 as well.  Goal:  Over the next 30 days, the patient will not experience hospital readmission  Interventions:  Transitions of Care: Doctor Visits  - discussed the importance of doctor visits Communication with primary care provider re: enrollment in the 30 day TOC program Encouraged deep breathing and coughing every 2 hours to mobilize secretions  Monitor oxygen level with oximeter to ensure oxygen level in the 90's. Report any symptoms of shortness of breath and or low oxygen levels less than 90% to provider.  Seek emergency medical services for severe symptoms.  Take in 8-10 glasses / day of water/ fluid to thin secretions  Reviewed upcoming provider visits Medications reviewed and compliance discussed/ advised. 11/26/24 Reviewed Discharge wound care: Foam dressing  Every 3 days     Comments: Mepilex foam dressing to affected buttock, patient to change 11/27/24 he states, and verbalizes  understanding. Assessed for restart of home health services Discussed signs/ symptoms of bleeding. Advised to notify provider of any signs of bleeding immediately. Fall precautions discussed- on warfarin/ levenox Reviewed and discussed pulmonology and oncology appointments from 12/03/24  Patient Self Care Activities:  Attend all scheduled provider appointments Call pharmacy for medication refills 3-7 days in advance of running out of medications Call provider office for new concerns or questions  Notify RN Care Manager of TOC call rescheduling needs Participate in Transition of Care Program/Attend TOC scheduled calls Take medications as prescribed   Stay hydrated drinking at least 8-10 glasses of water/ fluid daily Call and schedule a hospital follow up visit with your primary care provider Take your antibiotics until completed Contact your provider for any new/ ongoing symptoms Seek emergency medical services for severe symptoms.  Notify provider immediately for any signs of bleeding: blood in your urine, stool, excessive bruising, bleeding from gums  Plan:  Telephone follow up appointment with care management team member scheduled for:  12/13/24 at 2 pm           Patient verbalizes understanding of instructions and care plan provided today and agrees to view in MyChart. Active MyChart status and patient understanding of how to access instructions and care plan via MyChart confirmed with patient.     The patient has been provided with contact information for the care management team and has been advised to call with any health related questions or concerns.   Please call the care guide team at [phone]  if you need to cancel or reschedule your appointment.   Please call the Suicide and Crisis Lifeline: 988 call the [place]  National Suicide Prevention Lifeline: (626)587-0764 or TTY: 787-272-8194 TTY (814) 428-5302) to talk to a trained counselor call 1-800-273-TALK (toll free, 24  hour hotline) if you are experiencing a Mental Health or Behavioral Health Crisis or need someone to talk to.  Arvin Seip RN, BSN, CCM [employer], Population Health Case Manager Phone: [phone]

## 2024-12-05 NOTE — Telephone Encounter (Signed)
 Attempted to reach patient via cell phone without success. Sent patient mychart message in regards to per Olam Ned NP urine culture has come back negative.

## 2024-12-06 ENCOUNTER — Inpatient Hospital Stay: Payer: MEDICARE | Admitting: Dietician

## 2024-12-06 NOTE — Progress Notes (Signed)
 Nutrition Assessment   Reason for Assessment: Patient requested referral for weight loss.    ASSESSMENT: Patient is 79 year old male with CLL treated with acalabrutinib   and followed by Dr. Cloretta. 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   Patient concerned with lack of appetite.  Lives with girlfriend who helps with cooking, also works during day she is allergic to yellow onions (she can do red and white) and garlic (substitutes shallots) which dictates some food choices he has no known food allergies. Currently has wound left upper buttock appears consistent with ecchymosis.  Usual intake: Breakfast: Cereal sometimes cold, sometimes oatmeal, biscotti, loves yogurt light and fit sometimes with fruit  Takes dog for walk. Also getting some PT. Lunch: most difficult to know how to eat as girlfriend is working. Usual PB and  jelly sandwich, also likes scramble eggs,   Dinner: Salmon,  salads, likes mushrooms, beets.  Fluids: Coffee in morning, 64 oz water goal, coke or soda rarely, half and half tea  Patient concerned with weight loss recently. Also desired recommendations on how to bulk up and have more energy.  Meds include: Coumadin , MVI  Labs: 11/23/24  GFR 56, Ca 8.6, Creat 1.29  Anthropometrics:   Height: 71 Weight:  12/04/23  170.7# UBW: 185-190# BMI: 23.82    NUTRITION DIAGNOSIS: Inadequate PO intake to meet increased nutrient needs, r/t recent infections and increased EEN  INTERVENTION:   Educated on importance of adequate calorie and protein energy intake  with nutrient dense foods when possible to maintain weight/strength    Encouraged added plant foods and healthy snacking to  boost energy and calorie intake  Reviewed role of oral nutrition supplement and cautioned if using not to but anything greater than 30 g per serving for renal preservation  Discussed strategies for protein pacing and goal 20-30 grams per meal.  Suggested 500mg  Vit C/day for wound healing.  Emailed Nutrition Tip sheet  for  High Protein Milk Recipe, Protein foods with contact information provided   MONITORING, EVALUATION, GOAL: weight, PO intake, Nutrition Impact Symptoms, labs Goal is weight maintenance  Next Visit: Remote next month, prefers early in day, SO Amy may be able to join.  Micheline Craven, RDN, LDN Registered Dietitian, [facility] Part Time Remote (Usual office hours: Tuesday-Thursday) Cell: [phone]

## 2024-12-07 ENCOUNTER — Inpatient Hospital Stay: Payer: MEDICARE

## 2024-12-07 ENCOUNTER — Telehealth: Payer: Self-pay | Admitting: *Deleted

## 2024-12-07 DIAGNOSIS — R8290 Unspecified abnormal findings in urine: Secondary | ICD-10-CM

## 2024-12-07 DIAGNOSIS — C9110 Chronic lymphocytic leukemia of B-cell type not having achieved remission: Secondary | ICD-10-CM

## 2024-12-07 LAB — CBC WITH DIFFERENTIAL (CANCER CENTER ONLY)
Abs Immature Granulocytes: 0.04 K/uL (ref 0.00–0.07)
Basophils Absolute: 0.1 K/uL (ref 0.0–0.1)
Basophils Relative: 0 %
Eosinophils Absolute: 0.2 K/uL (ref 0.0–0.5)
Eosinophils Relative: 1 %
HCT: 29 % — ABNORMAL LOW (ref 39.0–52.0)
Hemoglobin: 9.2 g/dL — ABNORMAL LOW (ref 13.0–17.0)
Immature Granulocytes: 0 %
Lymphocytes Relative: 95 %
Lymphs Abs: 47.4 K/uL — ABNORMAL HIGH (ref 0.7–4.0)
MCH: 32.2 pg (ref 26.0–34.0)
MCHC: 31.7 g/dL (ref 30.0–36.0)
MCV: 101.4 fL — ABNORMAL HIGH (ref 80.0–100.0)
Monocytes Absolute: 0.2 K/uL (ref 0.1–1.0)
Monocytes Relative: 0 %
Neutro Abs: 1.9 K/uL (ref 1.7–7.7)
Neutrophils Relative %: 4 %
Platelet Count: 91 K/uL — ABNORMAL LOW (ref 150–400)
RBC: 2.86 MIL/uL — ABNORMAL LOW (ref 4.22–5.81)
RDW: 16 % — ABNORMAL HIGH (ref 11.5–15.5)
WBC Count: 49.9 K/uL — ABNORMAL HIGH (ref 4.0–10.5)
nRBC: 0.1 % (ref 0.0–0.2)

## 2024-12-07 LAB — PROTIME-INR
INR: 1.4 — ABNORMAL HIGH (ref 0.8–1.2)
Prothrombin Time: 18.1 s — ABNORMAL HIGH (ref 11.4–15.2)

## 2024-12-07 NOTE — Telephone Encounter (Signed)
 MD reviewed CBC/INR today. May D/C Lovenox  and change warfarin to 12.5 mg daily. Recheck lab on 12/12/23. HP scheduling message sent.

## 2024-12-10 ENCOUNTER — Telehealth: Payer: Self-pay | Admitting: Oncology

## 2024-12-10 ENCOUNTER — Ambulatory Visit (HOSPITAL_BASED_OUTPATIENT_CLINIC_OR_DEPARTMENT_OTHER): Payer: MEDICARE | Admitting: Family Medicine

## 2024-12-10 NOTE — Telephone Encounter (Signed)
 Left detailed message on Patient voicemail.

## 2024-12-11 ENCOUNTER — Encounter: Payer: Self-pay | Admitting: Oncology

## 2024-12-11 ENCOUNTER — Encounter (HOSPITAL_BASED_OUTPATIENT_CLINIC_OR_DEPARTMENT_OTHER): Payer: Self-pay | Admitting: Pulmonary Disease

## 2024-12-11 ENCOUNTER — Inpatient Hospital Stay: Payer: MEDICARE

## 2024-12-11 ENCOUNTER — Ambulatory Visit: Payer: Self-pay | Admitting: Oncology

## 2024-12-11 DIAGNOSIS — Z7901 Long term (current) use of anticoagulants: Secondary | ICD-10-CM

## 2024-12-11 DIAGNOSIS — J42 Unspecified chronic bronchitis: Secondary | ICD-10-CM

## 2024-12-11 DIAGNOSIS — J90 Pleural effusion, not elsewhere classified: Secondary | ICD-10-CM

## 2024-12-11 DIAGNOSIS — C9110 Chronic lymphocytic leukemia of B-cell type not having achieved remission: Secondary | ICD-10-CM

## 2024-12-11 LAB — PROTIME-INR
INR: 1.5 — ABNORMAL HIGH (ref 0.8–1.2)
Prothrombin Time: 19 s — ABNORMAL HIGH (ref 11.4–15.2)

## 2024-12-11 NOTE — Telephone Encounter (Signed)
-----   Message from Arley Hof, MD sent at 12/11/2024  2:03 PM EST ----- Please call patient, the PT/INR is slightly higher, continue Coumadin  at the current dose, repeat PT/INR 12/15/2023 ----- Message ----- From: Debby Olam POUR, NP Sent: 12/11/2024  12:51 PM EST To: Arley KATHEE Hof, MD  Coumadin  increased to 12.5 mg daily beginning 12/07/2024

## 2024-12-12 NOTE — Telephone Encounter (Signed)
 I have sent an order to DME this is not something we have in the office

## 2024-12-13 ENCOUNTER — Inpatient Hospital Stay: Payer: MEDICARE

## 2024-12-13 ENCOUNTER — Telehealth: Payer: Self-pay | Admitting: *Deleted

## 2024-12-13 ENCOUNTER — Inpatient Hospital Stay: Payer: MEDICARE | Admitting: Nurse Practitioner

## 2024-12-13 ENCOUNTER — Encounter: Payer: Self-pay | Admitting: Nurse Practitioner

## 2024-12-13 VITALS — BP 124/66 | HR 78 | Temp 97.6°F | Resp 16 | Wt 172.2 lb

## 2024-12-13 DIAGNOSIS — Z7901 Long term (current) use of anticoagulants: Secondary | ICD-10-CM

## 2024-12-13 DIAGNOSIS — C9110 Chronic lymphocytic leukemia of B-cell type not having achieved remission: Secondary | ICD-10-CM

## 2024-12-13 LAB — CBC WITH DIFFERENTIAL (CANCER CENTER ONLY)
Abs Immature Granulocytes: 0.05 K/uL (ref 0.00–0.07)
Basophils Absolute: 0 K/uL (ref 0.0–0.1)
Basophils Relative: 0 %
Eosinophils Absolute: 0.3 K/uL (ref 0.0–0.5)
Eosinophils Relative: 1 %
HCT: 29.8 % — ABNORMAL LOW (ref 39.0–52.0)
Hemoglobin: 9.8 g/dL — ABNORMAL LOW (ref 13.0–17.0)
Immature Granulocytes: 0 %
Lymphocytes Relative: 93 %
Lymphs Abs: 51.2 K/uL — ABNORMAL HIGH (ref 0.7–4.0)
MCH: 32.9 pg (ref 26.0–34.0)
MCHC: 32.9 g/dL (ref 30.0–36.0)
MCV: 100 fL (ref 80.0–100.0)
Monocytes Absolute: 0.3 K/uL (ref 0.1–1.0)
Monocytes Relative: 1 %
Neutro Abs: 2.4 K/uL (ref 1.7–7.7)
Neutrophils Relative %: 5 %
Platelet Count: 89 K/uL — ABNORMAL LOW (ref 150–400)
RBC: 2.98 MIL/uL — ABNORMAL LOW (ref 4.22–5.81)
RDW: 16.3 % — ABNORMAL HIGH (ref 11.5–15.5)
Smear Review: NORMAL
WBC Count: 54.2 K/uL (ref 4.0–10.5)
nRBC: 0 % (ref 0.0–0.2)

## 2024-12-13 LAB — CMP (CANCER CENTER ONLY)
ALT: 15 U/L (ref 0–44)
AST: 15 U/L (ref 15–41)
Albumin: 4 g/dL (ref 3.5–5.0)
Alkaline Phosphatase: 38 U/L (ref 38–126)
Anion gap: 10 (ref 5–15)
BUN: 45 mg/dL — ABNORMAL HIGH (ref 8–23)
CO2: 25 mmol/L (ref 22–32)
Calcium: 10.3 mg/dL (ref 8.9–10.3)
Chloride: 102 mmol/L (ref 98–111)
Creatinine: 1.27 mg/dL — ABNORMAL HIGH (ref 0.61–1.24)
GFR, Estimated: 57 mL/min — ABNORMAL LOW
Glucose, Bld: 97 mg/dL (ref 70–99)
Potassium: 4 mmol/L (ref 3.5–5.1)
Sodium: 137 mmol/L (ref 135–145)
Total Bilirubin: 0.5 mg/dL (ref 0.0–1.2)
Total Protein: 9.1 g/dL — ABNORMAL HIGH (ref 6.5–8.1)

## 2024-12-13 LAB — PROTIME-INR
INR: 1.6 — ABNORMAL HIGH (ref 0.8–1.2)
Prothrombin Time: 19.8 s — ABNORMAL HIGH (ref 11.4–15.2)

## 2024-12-13 NOTE — Progress Notes (Signed)
 " [facility] OFFICE PROGRESS NOTE   Diagnosis: CLL  INTERVAL HISTORY:   [name] returns as scheduled.  He continues acalabrutinib .  He denies nausea/vomiting.  No mouth sores.  No diarrhea.  Recent constipation.  Last bowel movement 2 days ago.  Appetite is better.  No fevers or sweats.  No confusion.  No arthralgias.  He denies bleeding.  He notes ecchymoses lower chest and upper abdomen.  Objective:  Vital signs in last 24 hours:  Blood pressure 124/66, pulse 78, temperature 97.6 F (36.4 C), temperature source Temporal, resp. rate 16, weight 172 lb 3.2 oz (78.1 kg), SpO2 100%.    HEENT: No thrush or ulcers. Resp: Lungs clear bilaterally. Cardio: Regular rate and rhythm. GI: Firm mass mid abdomen just to left of midline, mild associated tenderness. Vascular: No leg edema. Neuro: Alert and oriented. Skin: Skin at gluteal fold and extending to buttocks is erythematous.  Previously noted ecchymosis at the left low back/upper buttock has resolved.  Resolving lesion at the right lower buttock with small scabbed area located centrally.  Resolving ecchymoses left breast, right lower anterior chest, lower abdominal wall.   Lab Results:  Lab Results  Component Value Date   WBC 54.2 (HH) 12/13/2024   HGB 9.8 (L) 12/13/2024   HCT 29.8 (L) 12/13/2024   MCV 100.0 12/13/2024   PLT 89 (L) 12/13/2024   NEUTROABS PENDING 12/13/2024    Imaging:  No results found.  Medications: I have reviewed the patient's current medications.  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 He will return for a monthly PT check.  Indwelling IVC catheter.  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        4. 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 erectile dysfunction-followed by Dr. Watt , status post placement of a penile implant at [facility] in April  2014            7. CT of the abdomen/pelvis at [facility] urology February 2014 for evaluation of hematuria-12 mm external iliac nodes and left periaortic retroperitoneal lymph node-11 mm-potentially related to CLL      8.  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, peripheral lymphocytosis and small palpable lymph nodes          CT abdomen/pelvis 09/18/2019-extensive abdominal pelvic lymphadenopathy including a left periaortic node measuring 5.7 cm 08/13/2020 CLL FISH panel-no evidence of trisomy 12; no evidence of [id number]; no evidence of p53 deletion or amplification; deletion of ATM present 11/05/2020-peripheral blood TP53 mutation-negative Cycle 1 bendamustine /Rituxan  10/08/2020, 10/09/2020 Cycle 2 bendamustine /Rituxan  11/05/2020, 11/06/2020 Cycle 3 bendamustine /Rituxan  12/03/2020, 12/04/2020 Cycle 4 bendamustine /Rituxan  12/31/2020, 01/01/2021 CTs 01/19/2021-decrease in bulky abdomen/pelvic lymphadenopathy with more widespread ill-defined haziness in the mesentery and retroperitoneal fat, decreased pelvic lymphadenopathy Cycle 5 bendamustine /Rituxan  01/28/2021, 01/29/2021 Cycle 6 bendamustine /rituximab  02/25/2021, 02/26/2021 CT 02/22/2023-mild generalized lymphadenopathy in the neck, significant increase in chest andabdominal/pelvic lymph nodes, dominant nodal mass in the mesentery CT 06/25/2023-progressive lymphadenopathy in the abdomen and pelvis, dominant confluent mass in the upper abdomen/retroperitoneum has enlarged, stable small nodes in the chest Cycle 1 Bendamustine /Rituxan  07/07/2023, 07/08/2023 Cycle 2 Bendamustine /Rituxan  08/08/2023, 08/09/2023 CT abdomen/pelvis 08/30/2023-bulky matted lymphadenopathy and soft tissue mass encasing retroperitoneal structures and extending into the small bowel mesentery slightly diminished in size.  Additional matted gastrohepatic ligament, portacaval, porta hepatis, bilateral iliac and inguinal lymph nodes also slightly  diminished in size.  Spleen measures smaller. Cycle 3 Bendamustine /Rituxan  09/05/2023, 09/06/2023 Cycle 4 Bendamustine /Rituxan  10/03/2023, 10/04/2023 10/23/2024 CTs: New moderate right pleural effusion, progressive mediastinal and axillary nodes, confluent abdominal/retroperitoneal adenopathy with a mass at the level of the kidneys measuring 19.1 cm, marked bilateral pelvic sidewall adenopathy Trial of acalabrutinib  11/07/2024, stopped 11/11/2024 when hospitalized, resumed 11/15/2024     9.  Basal cell carcinoma removed from the left superior central forehead 01/21/2020    10.  Renal insufficiency    11.  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  Indeterminate left hepatic lobe lesion on CT 02/22/2023 13.  Fever and altered mental status following cycle 2 Cemiplimab -no source for infection identified 14.  Hypercalcemia-Zometa  10/30/2024 15.  Admission 11/11/2024 through 11/15/2024 with pneumonia, E. coli bacteremia 16.  Admission 11/20/2024 with erythema/pain at the right gluteal region, report of fever and chills 11/20/2024 CT pelvis: Soft tissue thickening at the medial right buttock with inflammatory stranding extending into the perineum, no drainable fluid collection, confluent abdomen/pelvic adenopathy  Disposition: Chad Gross appears stable.  He continues acalabrutinib .  Overall tolerating well.  Plan to  continue same.  The ecchymoses, which all appear to be resolving, are likely secondary to a combination of acalabrutinib , thrombocytopenia, blood thinner.  Labs from today reviewed.  CBC is stable.  INR remains subtherapeutic, slowly improving.  He will continue Coumadin  12.5 mg daily.  Repeat CBC/PT/INR in 1 week.  He will return for lab and follow-up on 12/31/2024.  He will contact the office in the interim with any problems.    Chad Gross ANP/GNP-BC   12/13/2024  10:22 AM         "

## 2024-12-13 NOTE — Telephone Encounter (Signed)
 done

## 2024-12-13 NOTE — Progress Notes (Signed)
 Patient seen by Lonna Cobb NP today  Vitals are within treatment parameters:Yes   Labs are within treatment parameters: No (Please specify and give further instructions.)   Treatment plan has been signed: Yes   Per physician team, Patient is ready for treatment and there are NO modifications to the treatment plan.

## 2024-12-14 ENCOUNTER — Telehealth: Payer: Self-pay

## 2024-12-14 NOTE — Patient Instructions (Signed)
 Visit Information  Thank you for taking time to visit with me today. Please don't hesitate to contact me if I can be of assistance to you before our next scheduled telephone appointment.  Our next appointment is by telephone on 12/19/24 at 10:30 am  Following is a copy of your care plan:   Goals Addressed             This Visit's Progress    VBCI Transitions of Care (TOC) Care Plan       11/26/24 Restarted Care Plan with ongoing follow up for pneumonia and currently with Abscess to right buttocks Problems:  Recent Hospitalization for treatment of community acquired pneumonia 11/26/24 Restart care plan - wound to right buttocks,    11/26/24 Earliest hospital follow up is noted for 12/24/24 with PCP; patient endorses follow up with Dr. Cloretta for most of his medical management at this time. He is anticipating a call from Dr. Andriette office for labs and follow up. Patient desires to keep current appointment.   Goal:  Over the next 30 days, the patient will not experience hospital readmission  Interventions:  Transitions of Care: Doctor Visits  - discussed the importance of doctor visits Encouraged deep breathing and coughing every 2 hours to mobilize secretions  Monitor oxygen level with oximeter to ensure oxygen level in the 90's. Report any symptoms of shortness of breath and or low oxygen levels less than 90% to provider.  Seek emergency medical services for severe symptoms.  Assessed activity level Take in 8-10 glasses / day of water/ fluid to thin secretions  Reviewed upcoming provider visits- discussed 12/06/24 oncology visit  Medications reviewed and compliance discussed/ advised. Patient reports completed antibiotics Assessed for restart of home health services  Patient states his home health services have started Discussed signs/ symptoms of bleeding. Advised to notify provider of any signs of bleeding immediately. Fall precautions reinforced- on warfarin/ levenox  Patient  Self Care Activities:  Attend all scheduled provider appointments Call pharmacy for medication refills 3-7 days in advance of running out of medications Call provider office for new concerns or questions  Notify RN Care Manager of TOC call rescheduling needs Participate in Transition of Care Program/Attend TOC scheduled calls Take medications as prescribed   Stay hydrated drinking at least 8-10 glasses of water/ fluid daily Call and schedule a hospital follow up visit with your primary care provider Take your antibiotics until completed Contact your provider for any new/ ongoing symptoms Seek emergency medical services for severe symptoms.  Notify provider immediately for any signs of bleeding: blood in your urine, stool, excessive bruising, bleeding from gums  Plan:  Telephone follow up appointment with care management team member scheduled for:  12/19/24 at 10:30 am           Patient verbalizes understanding of instructions and care plan provided today and agrees to view in MyChart. Active MyChart status and patient understanding of how to access instructions and care plan via MyChart confirmed with patient.     The patient has been provided with contact information for the care management team and has been advised to call with any health related questions or concerns.   Please call the care guide team at [phone] if you need to cancel or reschedule your appointment.   Please call the Suicide and Crisis Lifeline: 988 call the [place]  National Suicide Prevention Lifeline: 681-466-5871 or TTY: 639-252-4476 TTY (272)489-4456) to talk to a trained counselor call 1-800-273-TALK (toll free, 24 hour hotline)  if you are experiencing a Mental Health or Behavioral Health Crisis or need someone to talk to.  Arvin Seip RN, BSN, CCM [employer], Population Health Case Manager Phone: [phone]

## 2024-12-14 NOTE — Transitions of Care (Post Inpatient/ED Visit) (Signed)
 " Transition of Care week 3  Visit Note  12/14/2024  Name: Chad Gross MRN: [id number]          DOB: [birth year]  Situation: Patient enrolled in [facility] 30-day program. Visit completed with patient by telephone.   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   Clotting disorder 11/29/62   Have had three episodes of DVTs   ED (erectile dysfunction)    History of DVT (deep vein thrombosis)    Hyperlipidemia    Hypertension    Pericarditis    Pulmonary embolism (HCC)     Assessment: Patient Reported Symptoms: Cognitive Cognitive Status: No symptoms reported, Alert and oriented to person, place, and time, Insightful and able to interpret abstract concepts, Normal speech and language skills      Neurological Neurological Review of Symptoms: No symptoms reported    HEENT HEENT Symptoms Reported: Other: HEENT Comment: patient reports improvement in hoarseness and phelgm production    Cardiovascular Cardiovascular Symptoms Reported: No symptoms reported    Respiratory Respiratory Symptoms Reported: No symptoms reported    Endocrine Endocrine Symptoms Reported: No symptoms reported    Gastrointestinal Gastrointestinal Symptoms Reported: Constipation Additional Gastrointestinal Details: patient reports having mild constipation. He states his doctor advised him to get an OTC stool softner.  He states he will check on a stool softner today at [facility].      Genitourinary Genitourinary Symptoms Reported: No symptoms reported    Integumentary Integumentary Symptoms Reported: Bruising Additional Integumentary Details: patient reports ongoing bruising  to chest and upper abdomen.  He states the bruising is somewhat  improving. Patient reports having hematoma in several areas on his body such as buttocks and arm.  He states his oncologist is aware of this and reassured him that this is normal for his condition and treatment.  Next oncology appointment scheduled for 12/31/24. Skin Management Strategies: Routine screening, Medication therapy  Musculoskeletal Musculoskelatal Symptoms Reviewed: No symptoms reported Additional Musculoskeletal Details: patient states he is feeling much better. he states he is able to walk his dog and has been given authorization to restart driving. Musculoskeletal Management Strategies: Routine screening      Psychosocial Psychosocial Symptoms Reported: No symptoms reported         There were no vitals filed for this visit. Pain Scale: 0-10 Pain Score: 0-No pain  Medications Reviewed Today     Reviewed by Shan Valdes E, RN (Registered Nurse) on 12/14/24 at 1634  Med List Status: <None>   Medication Order Taking? Sig Documenting Provider Last Dose Status Informant  acalabrutinib  maleate (CALQUENCE ) 100 MG tablet [id number] Yes Take 1 tablet (100 mg total) by mouth 2 (two) times daily. Cloretta Arley NOVAK, MD  Active   acetaminophen  (TYLENOL ) 500 MG tablet [id number] Yes Take 500-1,000 mg by mouth every 6 (six) hours as needed (for pain or headaches). [provider]  Active Self  albuterol  (VENTOLIN  HFA) 108 (90 Base) MCG/ACT inhaler [id number] Yes Inhale 1-2 puffs into the lungs every 6 (six) hours as needed for wheezing or shortness of breath. Kassie Acquanetta Bradley, MD  Active   Coenzyme Q10 (COQ10) 100 MG CAPS [id number] Yes Take 100 mg by mouth daily. [provider]  Active Self  enoxaparin  (LOVENOX ) 80 MG/0.8ML  injection [id number] Yes Inject 0.8 mLs (80 mg total) into the skin every 12 (twelve) hours. Ezenduka, Nkeiruka J, MD  Active   Ensure Plus (ENSURE PLUS) BERNICE [id number] Yes Take 237 mLs by mouth See admin instructions. Drink 237 ml's of either STRAWBERRY or  CHOCOLATE by mouth one to two times a day [provider]  Active Self  furosemide  (LASIX ) 20 MG tablet [id number] Yes Take 1 tablet (20 mg total) by mouth daily.  Patient taking differently: Take 20 mg by mouth daily. To start this weekend   Kassie Acquanetta Bradley, MD  Active   GLUCOSAMINE CHONDROITIN COMPLX PO [id number] Yes Take 1 tablet by mouth daily. [provider]  Active Self  hydrochlorothiazide  (HYDRODIURIL ) 12.5 MG tablet [id number] Yes TAKE 1 TABLET BY MOUTH EVERY DAY de [place], Raymond J, MD  Active   losartan  (COZAAR ) 50 MG tablet [id number] Yes TAKE 1 TABLET BY MOUTH EVERY DAY [place], Quintin PARAS, MD  Active Self  meclizine  (ANTIVERT ) 12.5 MG tablet [id number] Yes Take 1 tablet (12.5 mg total) by mouth 3 (three) times daily as needed for dizziness. Cloretta Arley NOVAK, MD  Active Self  Multiple Vitamins-Minerals (MENS MULTIVITAMIN) TABS [id number] Yes Take 1 tablet by mouth daily with supper. [provider]  Active Self  pravastatin  (PRAVACHOL ) 40 MG tablet [id number] Yes TAKE 1 TABLET BY MOUTH EVERY DAY  Patient taking differently: Take 40 mg by mouth at bedtime.   de [place], Quintin PARAS, MD  Active Self  traMADol  (ULTRAM ) 50 MG tablet [id number] Yes Take 1 tablet (50 mg total) by mouth every 6 (six) hours as needed for moderate pain or severe pain. de [place], Quintin PARAS, MD  Active Self  triamcinolone  acetonide 40 MG/ML SUSP 40 mg, mupirocin cream 2 % CREA 15 g [id number] Yes Apply 1 application  topically as needed (for irritation- face). [provider]  Active Self  triamcinolone  cream (KENALOG ) 0.1 % [id number] Yes Apply 1 Application topically 2 (two) times daily as needed.  Patient taking differently: Apply 1 Application topically 2 (two) times daily as needed (fotr itching).   de [place], Raymond J, MD  Active Self  warfarin (COUMADIN ) 10 MG tablet [id number] Yes TAKE 1 TABLET BY MOUTH DAILY AT 6:00 AM  Patient taking differently: Take 10 mg by mouth every evening.    Cloretta Arley NOVAK, MD  Active Self           Med Note RENNIS, DUROJAHYE' R   Mon Nov 12, 2024  2:43 AM)    warfarin (COUMADIN ) 2.5 MG tablet [id number] Yes TAKE 2.5 MG WITH 10 MG TABLET ON MONDAY & THURSDAY FOR DOSE OF 12.5 MG  Patient taking differently: Take 2.5 mg by mouth See admin instructions. Take 2.5 mg by mouth in the evening on MWF in conjunction with one 10 mg tablet *equals 12.5 mgDEWAINE Cloretta Arley NOVAK, MD  Active Self           Med Note MARISA, NATHANEL LOISE [place] Nov 21, 2024  4:00 PM)    Med List Note Marisa Nathanel LOISE [place] 11/21/24 1556): [place]  filled at [facility]Special[facility])  Last name is SHUH-pell            Goals Addressed             This Visit's Progress    VBCI Transitions of Care (TOC) Care Plan       11/26/24 Restarted Care Plan with ongoing follow up for pneumonia  and currently with Abscess to right buttocks Problems:  Recent Hospitalization for treatment of community acquired pneumonia 11/26/24 Restart care plan - wound to right buttocks,    11/26/24 Earliest hospital follow up is noted for 12/24/24 with PCP; patient endorses follow up with Dr. Cloretta for most of his medical management at this time. He is anticipating a call from Dr. Andriette office for labs and follow up. Patient desires to keep current appointment.   Goal:  Over the next 30 days, the patient will not experience hospital readmission  Interventions:  Transitions of Care: Doctor Visits  - discussed the importance of doctor visits Encouraged deep breathing and coughing every 2 hours to mobilize secretions  Monitor oxygen level with oximeter to ensure oxygen level in the 90's. Report any symptoms of shortness of breath and or low oxygen levels less than 90% to provider.  Seek emergency medical services for severe symptoms.  Assessed activity level Take in 8-10 glasses / day of water/ fluid to thin secretions  Reviewed upcoming provider visits- discussed 12/06/24 oncology visit   Medications reviewed and compliance discussed/ advised. Patient reports completed antibiotics Assessed for restart of home health services  Patient states his home health services have started Discussed signs/ symptoms of bleeding. Advised to notify provider of any signs of bleeding immediately. Fall precautions reinforced- on warfarin/ levenox  Patient Self Care Activities:  Attend all scheduled provider appointments Call pharmacy for medication refills 3-7 days in advance of running out of medications Call provider office for new concerns or questions  Notify RN Care Manager of TOC call rescheduling needs Participate in Transition of Care Program/Attend TOC scheduled calls Take medications as prescribed   Stay hydrated drinking at least 8-10 glasses of water/ fluid daily Call and schedule a hospital follow up visit with your primary care provider Take your antibiotics until completed Contact your provider for any new/ ongoing symptoms Seek emergency medical services for severe symptoms.  Notify provider immediately for any signs of bleeding: blood in your urine, stool, excessive bruising, bleeding from gums  Plan:  Telephone follow up appointment with care management team member scheduled for:  12/19/24 at 10:30 am            Recommendation:   Continue Current Plan of Care  Follow Up Plan:   Telephone follow-up in 1 week  Arvin Seip RN, BSN, CCM [place]  [facility], Population Health Case Manager Phone: [phone]     "

## 2024-12-16 ENCOUNTER — Other Ambulatory Visit: Payer: Self-pay | Admitting: Oncology

## 2024-12-16 DIAGNOSIS — Z7901 Long term (current) use of anticoagulants: Secondary | ICD-10-CM

## 2024-12-17 ENCOUNTER — Encounter: Payer: Self-pay | Admitting: Oncology

## 2024-12-18 ENCOUNTER — Encounter (HOSPITAL_BASED_OUTPATIENT_CLINIC_OR_DEPARTMENT_OTHER): Payer: Self-pay | Admitting: Family Medicine

## 2024-12-18 DIAGNOSIS — R531 Weakness: Secondary | ICD-10-CM

## 2024-12-19 ENCOUNTER — Other Ambulatory Visit: Payer: Self-pay | Admitting: Oncology

## 2024-12-19 ENCOUNTER — Telehealth: Payer: Self-pay

## 2024-12-19 DIAGNOSIS — Z7901 Long term (current) use of anticoagulants: Secondary | ICD-10-CM

## 2024-12-19 NOTE — Patient Instructions (Signed)
 Visit Information  Thank you for taking time to visit with me today. Please don't hesitate to contact me if I can be of assistance to you before our next scheduled telephone appointment.  Our next appointment is by telephone on 12/27/24 at 11:00 am  Following is a copy of your care plan:   Goals Addressed             This Visit's Progress    VBCI Transitions of Care (TOC) Care Plan       11/26/24 Restarted Care Plan with ongoing follow up for pneumonia and currently with Abscess to right buttocks Problems:  Recent Hospitalization for treatment of community acquired pneumonia 11/26/24 Restart care plan - wound to right buttocks,     Goal:  Over the next 30 days, the patient will not experience hospital readmission  Interventions:  Transitions of Care: Assessed for breathing issues  Report any symptoms of shortness of breath and or low oxygen levels less than 90% to provider.  Seek emergency medical services for severe symptoms.  Assessed activity level Take in 8-10 glasses / day of water/ fluid to thin secretions  Reviewed upcoming provider visits Medications reviewed and compliance discussed/ advised Assessed ongoing home health services  Discussed signs/ symptoms of bleeding. Advised to notify provider of any signs of bleeding immediately. Fall precautions reinforced- on warfarin  Patient Self Care Activities:  Call pharmacy for medication refills 3-7 days in advance of running out of medications Call provider office for new concerns or questions  Notify RN Care Manager of TOC call rescheduling needs Participate in Transition of Care Program/Attend TOC scheduled calls Take medications as prescribed   Stay hydrated drinking at least 8-10 glasses of water/ fluid daily Contact your provider for any new/ ongoing symptoms Seek emergency medical services for severe symptoms.  Notify provider immediately for any signs of bleeding: blood in your urine, stool, excessive bruising,  bleeding from gums  Plan:  Telephone follow up appointment with care management team member scheduled for:  12/27/24 at 11:00 am           Patient verbalizes understanding of instructions and care plan provided today and agrees to view in MyChart. Active MyChart status and patient understanding of how to access instructions and care plan via MyChart confirmed with patient.     The patient has been provided with contact information for the care management team and has been advised to call with any health related questions or concerns.   Please call the care guide team at [phone] if you need to cancel or reschedule your appointment.   Please call the Suicide and Crisis Lifeline: 988 call the [place]  National Suicide Prevention Lifeline: 934 642 9521 or TTY: 989-770-3753 TTY (323) 408-9968) to talk to a trained counselor call 1-800-273-TALK (toll free, 24 hour hotline) if you are experiencing a Mental Health or Behavioral Health Crisis or need someone to talk to.  Arvin Seip RN, BSN, CCM [employer], Population Health Case Manager Phone: [phone]

## 2024-12-19 NOTE — Transitions of Care (Post Inpatient/ED Visit) (Signed)
 " Transition of Care week 4  Visit Note  12/19/2024  Name: Chad Gross MRN: [id number]          DOB: Apr 18, 1946  Situation: Patient enrolled in [facility] 30-day program. Visit completed with patient by telephone.   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   Clotting disorder 11/29/62   Have had three episodes of DVTs   ED (erectile dysfunction)    History of DVT (deep vein thrombosis)    Hyperlipidemia    Hypertension    Pericarditis    Pulmonary embolism (HCC)     Assessment: Patient Reported Symptoms: Cognitive Cognitive Status: No symptoms reported, Able to follow simple commands, Insightful and able to interpret abstract concepts, Normal speech and language skills, Alert and oriented to person, place, and time      Neurological Neurological Review of Symptoms: No symptoms reported    HEENT HEENT Symptoms Reported: No symptoms reported      Cardiovascular Cardiovascular Symptoms Reported: No symptoms reported    Respiratory Respiratory Symptoms Reported: No symptoms reported    Endocrine Endocrine Symptoms Reported: No symptoms reported Is patient diabetic?: No    Gastrointestinal Gastrointestinal Symptoms Reported: Constipation Additional Gastrointestinal Details: Patient reports mild constipation. Patient states he stopped taking stool softners to avoid diarrhea Gastrointestinal Management Strategies: Medication therapy    Genitourinary Genitourinary Symptoms Reported: No symptoms reported    Integumentary Integumentary Symptoms Reported: No symptoms reported Other Integumentary Symptoms: Patient reports previous bruising has resolved.    Musculoskeletal Musculoskelatal Symptoms Reviewed: No  symptoms reported Additional Musculoskeletal Details: Patient reports home health PT will be completed on 12/20/24 pending referral for out-patient PT services. Patient reports OT completed assessment with home safety evaluation. No additional OT services needed. Musculoskeletal Management Strategies: Routine screening      Psychosocial Psychosocial Symptoms Reported: No symptoms reported         There were no vitals filed for this visit. Pain Scale: 0-10 Pain Score: 0-No pain  Medications Reviewed Today     Reviewed by Andon Villard E, RN (Registered Nurse) on 12/19/24 at 1601  Med List Status: <None>   Medication Order Taking? Sig Documenting Provider Last Dose Status Informant  acalabrutinib  maleate (CALQUENCE ) 100 MG tablet [id number] Yes Take 1 tablet (100 mg total) by mouth 2 (two) times daily. Cloretta Arley NOVAK, MD  Active   acetaminophen  (TYLENOL ) 500 MG tablet [id number] Yes Take 500-1,000 mg by mouth every 6 (six) hours as needed (for pain or headaches). [provider]  Active Self  albuterol  (VENTOLIN  HFA) 108 (90 Base) MCG/ACT inhaler [id number] Yes Inhale 1-2 puffs into the lungs every 6 (six) hours as needed for wheezing or shortness of breath. Kassie Acquanetta Bradley, MD  Active   Coenzyme Q10 (COQ10) 100 MG CAPS [id number] Yes Take 100 mg by mouth daily. [provider]  Active Self  enoxaparin  (LOVENOX ) 80 MG/0.8ML injection [id number]  Inject 0.8 mLs (80 mg total) into the skin every 12 (twelve) hours. Ezenduka, Nkeiruka J, MD  Expired 12/14/24 2359   Ensure Plus (ENSURE PLUS) LIQD [id number] Yes Take 237 mLs by mouth See admin instructions. Drink 237 ml's of either STRAWBERRY or CHOCOLATE by mouth one to two times a day [provider]  Active  Self  furosemide  (LASIX ) 20 MG tablet [id number] Yes Take 1 tablet (20 mg total) by mouth daily.  Patient taking differently: Take 20 mg by mouth daily. To start this weekend   Kassie Acquanetta Bradley, MD  Active    GLUCOSAMINE CHONDROITIN COMPLX PO [id number] Yes Take 1 tablet by mouth daily. [provider]  Active Self  hydrochlorothiazide  (HYDRODIURIL ) 12.5 MG tablet [id number] Yes TAKE 1 TABLET BY MOUTH EVERY DAY de [place], Raymond J, MD  Active   losartan  (COZAAR ) 50 MG tablet [id number] Yes TAKE 1 TABLET BY MOUTH EVERY DAY [place], Quintin PARAS, MD  Active Self  meclizine  (ANTIVERT ) 12.5 MG tablet [id number] Yes Take 1 tablet (12.5 mg total) by mouth 3 (three) times daily as needed for dizziness. Cloretta Arley NOVAK, MD  Active Self  Multiple Vitamins-Minerals (MENS MULTIVITAMIN) TABS [id number] Yes Take 1 tablet by mouth daily with supper. [provider]  Active Self  pravastatin  (PRAVACHOL ) 40 MG tablet [id number] Yes TAKE 1 TABLET BY MOUTH EVERY DAY  Patient taking differently: Take 40 mg by mouth at bedtime.   de [place], Quintin PARAS, MD  Active Self  traMADol  (ULTRAM ) 50 MG tablet [id number] Yes Take 1 tablet (50 mg total) by mouth every 6 (six) hours as needed for moderate pain or severe pain. de [place], Quintin PARAS, MD  Active Self  triamcinolone  acetonide 40 MG/ML SUSP 40 mg, mupirocin cream 2 % CREA 15 g [id number]  Apply 1 application  topically as needed (for irritation- face). [provider]  Active Self  triamcinolone  cream (KENALOG ) 0.1 % [id number] Yes Apply 1 Application topically 2 (two) times daily as needed.  Patient taking differently: Apply 1 Application topically 2 (two) times daily as needed (fotr itching).   [place], Raymond J, MD  Active Self  warfarin (COUMADIN ) 10 MG tablet [id number] Yes TAKE 1 TABLET BY MOUTH DAILY AT 6:00 AM Cloretta Arley NOVAK, MD  Active   warfarin (COUMADIN ) 2.5 MG tablet [id number] Yes TAKE 2.5 MG WITH 10 MG TABLET ON MONDAY & THURSDAY FOR DOSE OF 12.5 MG  Patient taking differently: Take 2.5 mg by mouth See admin instructions. Take 2.5 mg by mouth in the evening daily in conjunction with one 10 mg tablet *equals 12.5 mg per patient   Cloretta Arley NOVAK, MD   Active Self           Med Note MARISA, NATHANEL SAILOR   Wed Nov 21, 2024  4:00 PM)    Med List Note Marisa Nathanel SAILOR Bishop 11/21/24 1556): Calquence  filled at [facility] (Specialty)  Last name is SHUH-pell            Goals Addressed             This Visit's Progress    VBCI Transitions of Care (TOC) Care Plan       11/26/24 Restarted Care Plan with ongoing follow up for pneumonia and currently with Abscess to right buttocks Problems:  Recent Hospitalization for treatment of community acquired pneumonia 11/26/24 Restart care plan - wound to right buttocks,     Goal:  Over the next 30 days, the patient will not experience hospital readmission  Interventions:  Transitions of Care: Assessed for breathing issues  Report any symptoms of shortness of breath and or low oxygen levels less than 90% to provider.  Seek emergency medical services for severe symptoms.  Assessed activity level Take in 8-10 glasses / day of water/ fluid to thin secretions  Reviewed  upcoming provider visits Medications reviewed and compliance discussed/ advised Assessed ongoing home health services  Discussed signs/ symptoms of bleeding. Advised to notify provider of any signs of bleeding immediately. Fall precautions reinforced- on warfarin  Patient Self Care Activities:  Call pharmacy for medication refills 3-7 days in advance of running out of medications Call provider office for new concerns or questions  Notify RN Care Manager of TOC call rescheduling needs Participate in Transition of Care Program/Attend TOC scheduled calls Take medications as prescribed   Stay hydrated drinking at least 8-10 glasses of water/ fluid daily Contact your provider for any new/ ongoing symptoms Seek emergency medical services for severe symptoms.  Notify provider immediately for any signs of bleeding: blood in your urine, stool, excessive bruising, bleeding from gums  Plan:  Telephone follow up appointment with  care management team member scheduled for:  12/27/24 at 11:00 am           Recommendation:   PCP Follow-up Continue Current Plan of Care  Follow Up Plan:   Telephone follow-up in 1 week  Arvin Seip RN, BSN, CCM [place]  [facility], Population Health Case Manager Phone: [phone]     "

## 2024-12-20 ENCOUNTER — Inpatient Hospital Stay: Payer: MEDICARE

## 2024-12-20 DIAGNOSIS — C9110 Chronic lymphocytic leukemia of B-cell type not having achieved remission: Secondary | ICD-10-CM

## 2024-12-20 LAB — CBC WITH DIFFERENTIAL (CANCER CENTER ONLY)
Abs Immature Granulocytes: 0.04 K/uL (ref 0.00–0.07)
Basophils Absolute: 0.1 K/uL (ref 0.0–0.1)
Basophils Relative: 0 %
Eosinophils Absolute: 0.2 K/uL (ref 0.0–0.5)
Eosinophils Relative: 1 %
HCT: 28.2 % — ABNORMAL LOW (ref 39.0–52.0)
Hemoglobin: 9 g/dL — ABNORMAL LOW (ref 13.0–17.0)
Immature Granulocytes: 0 %
Lymphocytes Relative: 93 %
Lymphs Abs: 36.2 K/uL — ABNORMAL HIGH (ref 0.7–4.0)
MCH: 32.6 pg (ref 26.0–34.0)
MCHC: 31.9 g/dL (ref 30.0–36.0)
MCV: 102.2 fL — ABNORMAL HIGH (ref 80.0–100.0)
Monocytes Absolute: 0.2 K/uL (ref 0.1–1.0)
Monocytes Relative: 0 %
Neutro Abs: 2.3 K/uL (ref 1.7–7.7)
Neutrophils Relative %: 6 %
Platelet Count: 84 K/uL — ABNORMAL LOW (ref 150–400)
RBC: 2.76 MIL/uL — ABNORMAL LOW (ref 4.22–5.81)
RDW: 17.2 % — ABNORMAL HIGH (ref 11.5–15.5)
Smear Review: NORMAL
WBC Count: 39 K/uL — ABNORMAL HIGH (ref 4.0–10.5)
nRBC: 0 % (ref 0.0–0.2)

## 2024-12-20 LAB — PROTIME-INR
INR: 1.7 — ABNORMAL HIGH (ref 0.8–1.2)
Prothrombin Time: 21.2 s — ABNORMAL HIGH (ref 11.4–15.2)

## 2024-12-21 ENCOUNTER — Telehealth: Payer: Self-pay | Admitting: *Deleted

## 2024-12-21 DIAGNOSIS — Z7901 Long term (current) use of anticoagulants: Secondary | ICD-10-CM

## 2024-12-21 NOTE — Telephone Encounter (Signed)
 Called Chad Gross with INR 1.7 (slowly improving). Per NP: Continue warfarin at current dose of 12.5 mg daily and recheck INR on 1 pm on 12/27/24. He understands and agrees.

## 2024-12-24 ENCOUNTER — Inpatient Hospital Stay (HOSPITAL_BASED_OUTPATIENT_CLINIC_OR_DEPARTMENT_OTHER): Payer: MEDICARE | Admitting: Family Medicine

## 2024-12-27 ENCOUNTER — Encounter: Payer: Self-pay | Admitting: Oncology

## 2024-12-27 ENCOUNTER — Other Ambulatory Visit: Payer: Self-pay

## 2024-12-27 ENCOUNTER — Inpatient Hospital Stay: Payer: MEDICARE

## 2024-12-27 ENCOUNTER — Other Ambulatory Visit: Payer: Self-pay | Admitting: Oncology

## 2024-12-27 ENCOUNTER — Telehealth: Payer: Self-pay | Admitting: *Deleted

## 2024-12-27 ENCOUNTER — Telehealth: Payer: Self-pay

## 2024-12-27 DIAGNOSIS — C9110 Chronic lymphocytic leukemia of B-cell type not having achieved remission: Secondary | ICD-10-CM

## 2024-12-27 DIAGNOSIS — Z7901 Long term (current) use of anticoagulants: Secondary | ICD-10-CM

## 2024-12-27 LAB — PROTIME-INR
INR: 1.8 — ABNORMAL HIGH (ref 0.8–1.2)
Prothrombin Time: 21.4 s — ABNORMAL HIGH (ref 11.4–15.2)

## 2024-12-27 MED ORDER — CALQUENCE 100 MG PO TABS
100.0000 mg | ORAL_TABLET | Freq: Two times a day (BID) | ORAL | 0 refills | Status: AC
  Filled 2024-12-27 – 2024-12-28 (×2): qty 60, 30d supply, fill #0

## 2024-12-27 NOTE — Patient Instructions (Signed)
 Visit Information  Thank you for taking time to visit with me today. You have completed the 30 day TOC program and met your program goals. Please contact your primary care provider for any further needs or concerns.   Following is a copy of your care plan:   Goals Addressed             This Visit's Progress    COMPLETED: VBCI Transitions of Care (TOC) Care Plan       11/26/24 Restarted Care Plan with ongoing follow up for pneumonia and currently with Abscess to right buttocks Problems:  Recent Hospitalization for treatment of community acquired pneumonia 11/26/24 Restart care plan - wound to right buttocks,     Goal:  Over the next 30 days, the patient will not experience hospital readmission  Interventions:  Transitions of Care: Assessed for breathing issues  Advised to report any symptoms of shortness of breath and or low oxygen levels less than 90% to provider.  Seek emergency medical services for severe symptoms.  Assessed activity level Reviewed upcoming provider visits Medications reviewed and compliance discussed/ advised Discussed signs/ symptoms of bleeding. Advised to notify provider of any signs of bleeding immediately. Fall precautions reinforced- on warfarin Discussed and offered ongoing follow up with a CCM longitudinal nurse case manager.   Patient declined.   Patient Self Care Activities:  Call pharmacy for medication refills 3-7 days in advance of running out of medications Call provider office for new concerns or questions  Take medications as prescribed   Contact your provider for any new/ ongoing symptoms Seek emergency medical services for severe symptoms.  Notify provider immediately for any signs of bleeding: blood in your urine, stool, excessive bruising, bleeding from gums  Plan:  No further follow up required: Patient has completed the 30 day TOC program and met his program goals.            Patient verbalizes understanding of instructions and  care plan provided today and agrees to view in MyChart. Active MyChart status and patient understanding of how to access instructions and care plan via MyChart confirmed with patient.     No further follow up required: Patient has completed the 30 day TOC program and met his program goals.   Please call the care guide team at [phone] if you need to cancel or reschedule your appointment.   Please call the Suicide and Crisis Lifeline: 988 call the [place]  National Suicide Prevention Lifeline: 289-649-7606 or TTY: (410) 117-8785 TTY (316) 615-2810) to talk to a trained counselor call 1-800-273-TALK (toll free, 24 hour hotline) if you are experiencing a Mental Health or Behavioral Health Crisis or need someone to talk to. Arvin Seip RN, BSN, CCM [employer], Population Health Case Manager Phone: [phone]

## 2024-12-27 NOTE — Transitions of Care (Post Inpatient/ED Visit) (Signed)
 " Transition of Care week #5  Visit Note  12/27/2024  Name: Chad Gross MRN: [id number]          DOB: May 08, 1946  Situation: Patient enrolled in [facility] 30-day program. Visit completed with patient by telephone.   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   Clotting disorder 11/29/62   Have had three episodes of DVTs   ED (erectile dysfunction)    History of DVT (deep vein thrombosis)    Hyperlipidemia    Hypertension    Pericarditis    Pulmonary embolism (HCC)     Assessment: Patient Reported Symptoms: Cognitive Cognitive Status: No symptoms reported, Alert and oriented to person, place, and time, Insightful and able to interpret abstract concepts, Normal speech and language skills      Neurological Neurological Review of Symptoms: No symptoms reported    HEENT HEENT Symptoms Reported: No symptoms reported      Cardiovascular Cardiovascular Symptoms Reported: No symptoms reported    Respiratory Respiratory Symptoms Reported: No symptoms reported    Endocrine Endocrine Symptoms Reported: No symptoms reported    Gastrointestinal Gastrointestinal Symptoms Reported: No symptoms reported      Genitourinary Genitourinary Symptoms Reported: No symptoms reported    Integumentary Integumentary Symptoms Reported: No symptoms reported    Musculoskeletal Musculoskelatal Symptoms Reviewed: No symptoms reported        Psychosocial Psychosocial Symptoms Reported: No symptoms reported         There were no vitals filed for this visit. Pain Scale: 0-10 Pain Score: 0-No pain  Medications Reviewed Today     Reviewed by Carolyna Yerian E, RN (Registered Nurse) on 12/27/24 at 1030  Med List Status: <None>    Medication Order Taking? Sig Documenting Provider Last Dose Status Informant  acalabrutinib  maleate (CALQUENCE ) 100 MG tablet [id number] Yes Take 1 tablet (100 mg total) by mouth 2 (two) times daily. Cloretta Arley NOVAK, MD  Active   acetaminophen  (TYLENOL ) 500 MG tablet [id number] Yes Take 500-1,000 mg by mouth every 6 (six) hours as needed (for pain or headaches). [provider]  Active Self  albuterol  (VENTOLIN  HFA) 108 (90 Base) MCG/ACT inhaler [id number] Yes Inhale 1-2 puffs into the lungs every 6 (six) hours as needed for wheezing or shortness of breath. Kassie Acquanetta Bradley, MD  Active   Coenzyme Q10 (COQ10) 100 MG CAPS [id number] Yes Take 100 mg by mouth daily. [provider]  Active Self  enoxaparin  (LOVENOX ) 80 MG/0.8ML injection [id number]  Inject 0.8 mLs (80 mg total) into the skin every 12 (twelve) hours. Ezenduka, Nkeiruka J, MD  Expired 12/14/24 2359   Ensure Plus (ENSURE PLUS) LIQD [id number] Yes Take 237 mLs by mouth See admin instructions. Drink 237 ml's of either STRAWBERRY or CHOCOLATE by mouth one to two times a day [provider]  Active Self  furosemide  (LASIX ) 20 MG tablet [id number] Yes Take 1 tablet (20 mg total) by mouth daily.  Patient taking differently: Take 20 mg by mouth daily. To start this weekend   Kassie Acquanetta Bradley, MD  Active   GLUCOSAMINE CHONDROITIN COMPLX PO [id number] Yes Take 1 tablet by mouth daily. [provider]  Active Self  hydrochlorothiazide  (HYDRODIURIL ) 12.5 MG tablet [id number] Yes TAKE 1 TABLET BY  MOUTH EVERY DAY de [place], Raymond J, MD  Active   losartan  (COZAAR ) 50 MG tablet [id number] Yes TAKE 1 TABLET BY MOUTH EVERY DAY [place], Quintin PARAS, MD  Active Self  meclizine  (ANTIVERT ) 12.5 MG tablet [id number] Yes Take 1 tablet (12.5 mg total) by mouth 3 (three) times daily as needed for dizziness. Cloretta Arley NOVAK, MD  Active Self  Multiple Vitamins-Minerals (MENS MULTIVITAMIN) TABS [id number] Yes Take 1 tablet by mouth daily  with supper. [provider]  Active Self  pravastatin  (PRAVACHOL ) 40 MG tablet [id number] Yes TAKE 1 TABLET BY MOUTH EVERY DAY  Patient taking differently: Take 40 mg by mouth at bedtime.   de [place], Quintin PARAS, MD  Active Self  traMADol  (ULTRAM ) 50 MG tablet [id number] Yes Take 1 tablet (50 mg total) by mouth every 6 (six) hours as needed for moderate pain or severe pain. [place], Quintin PARAS, MD  Active Self  triamcinolone  acetonide 40 MG/ML SUSP 40 mg, mupirocin cream 2 % CREA 15 g [id number] Yes Apply 1 application  topically as needed (for irritation- face). [provider]  Active Self  triamcinolone  cream (KENALOG ) 0.1 % [id number] Yes Apply 1 Application topically 2 (two) times daily as needed.  Patient taking differently: Apply 1 Application topically 2 (two) times daily as needed (fotr itching).   de [place], Raymond J, MD  Active Self  warfarin (COUMADIN ) 10 MG tablet [id number] Yes TAKE 1 TABLET BY MOUTH DAILY AT 6:00 AM Cloretta Arley NOVAK, MD  Active   warfarin (COUMADIN ) 2.5 MG tablet [id number] Yes TAKE 2.5 MG WITH 10 MG TABLET ON MONDAY & THURSDAY FOR DOSE OF 12.5 MG  Patient taking differently: Take 2.5 mg by mouth See admin instructions. Take 2.5 mg by mouth in the evening daily in conjunction with one 10 mg tablet *equals 12.5 mg per patient   Cloretta Arley NOVAK, MD  Active Self           Med Note MARISA, NATHANEL SAILOR   Wed Nov 21, 2024  4:00 PM)    Med List Note Marisa Nathanel SAILOR Bishop 11/21/24 1556): Calquence  filled at [facility] (Specialty)  Last name is SHUH-pell            Goals Addressed             This Visit's Progress    COMPLETED: VBCI Transitions of Care (TOC) Care Plan       11/26/24 Restarted Care Plan with ongoing follow up for pneumonia and currently with Abscess to right buttocks Problems:  Recent Hospitalization for treatment of community acquired pneumonia 11/26/24 Restart care plan - wound to right buttocks,     Goal:  Over the next 30  days, the patient will not experience hospital readmission  Interventions:  Transitions of Care: Assessed for breathing issues  Advised to report any symptoms of shortness of breath and or low oxygen levels less than 90% to provider.  Seek emergency medical services for severe symptoms.  Assessed activity level Reviewed upcoming provider visits Medications reviewed and compliance discussed/ advised Discussed signs/ symptoms of bleeding. Advised to notify provider of any signs of bleeding immediately. Fall precautions reinforced- on warfarin Discussed and offered ongoing follow up with a CCM longitudinal nurse case manager.   Patient declined.   Patient Self Care Activities:  Call pharmacy for medication refills 3-7 days in advance of running out of medications Call provider office for new concerns or questions  Take medications as prescribed  Contact your provider for any new/ ongoing symptoms Seek emergency medical services for severe symptoms.  Notify provider immediately for any signs of bleeding: blood in your urine, stool, excessive bruising, bleeding from gums  Plan:  No further follow up required: Patient has completed the 30 day TOC program and met his program goals.             Recommendation:   Continue Current Plan of Care  Follow Up Plan:   Closing From:  Transitions of Care Program Patient has completed the 30 day TOC program and met his program goals.   Arvin Seip RN, BSN, CCM [employer], Population Health Case Manager Phone: [phone]     "

## 2024-12-27 NOTE — Telephone Encounter (Signed)
 Notified of INR 1.8 and to continue same warfarin at 12.5 mg daily. F/U as scheduled.

## 2024-12-28 ENCOUNTER — Other Ambulatory Visit: Payer: Self-pay

## 2024-12-28 ENCOUNTER — Other Ambulatory Visit (HOSPITAL_COMMUNITY): Payer: Self-pay

## 2024-12-28 ENCOUNTER — Other Ambulatory Visit: Payer: Self-pay | Admitting: Pharmacist

## 2024-12-28 ENCOUNTER — Encounter: Payer: Self-pay | Admitting: Nurse Practitioner

## 2024-12-28 NOTE — Progress Notes (Signed)
 Clinical Intervention Note  Clinical Intervention Notes: Patient reported that his warfarin dose has been increased temporarily. No DDI's identified with warfarin.   Clinical Intervention Outcomes: Prevention of an adverse drug event   Lyle LELON Chalk Specialty Pharmacist

## 2024-12-28 NOTE — Progress Notes (Signed)
 Specialty Pharmacy Refill Coordination Note  Chad Gross is a 79 y.o. male contacted today regarding refills of specialty medication(s) Acalabrutinib  Maleate (Calquence )   Patient requested Delivery   Delivery date: 01/03/25   Verified address: [address], [place] , [address] [place]   Medication will be filled on: 01/02/25

## 2024-12-30 ENCOUNTER — Telehealth: Payer: Self-pay

## 2024-12-30 NOTE — Telephone Encounter (Signed)
 Unable to reach patient, left a detailed voice message advising that Monday 12/31/24 appointments have been rescheduled.

## 2024-12-31 ENCOUNTER — Inpatient Hospital Stay: Payer: MEDICARE

## 2024-12-31 ENCOUNTER — Inpatient Hospital Stay: Payer: MEDICARE | Admitting: Oncology

## 2025-01-01 ENCOUNTER — Encounter: Payer: Self-pay | Admitting: Oncology

## 2025-01-01 ENCOUNTER — Inpatient Hospital Stay: Payer: MEDICARE | Attending: Oncology | Admitting: Oncology

## 2025-01-01 ENCOUNTER — Inpatient Hospital Stay: Payer: MEDICARE

## 2025-01-01 VITALS — BP 131/70 | HR 76 | Temp 97.8°F | Resp 18 | Ht 71.0 in | Wt 174.2 lb

## 2025-01-01 DIAGNOSIS — Z7901 Long term (current) use of anticoagulants: Secondary | ICD-10-CM

## 2025-01-01 DIAGNOSIS — C9110 Chronic lymphocytic leukemia of B-cell type not having achieved remission: Secondary | ICD-10-CM | POA: Diagnosis not present

## 2025-01-01 LAB — CBC WITH DIFFERENTIAL (CANCER CENTER ONLY)
Abs Immature Granulocytes: 0.03 10*3/uL (ref 0.00–0.07)
Basophils Absolute: 0.1 10*3/uL (ref 0.0–0.1)
Basophils Relative: 0 %
Eosinophils Absolute: 0.2 10*3/uL (ref 0.0–0.5)
Eosinophils Relative: 1 %
HCT: 30.2 % — ABNORMAL LOW (ref 39.0–52.0)
Hemoglobin: 10 g/dL — ABNORMAL LOW (ref 13.0–17.0)
Immature Granulocytes: 0 %
Lymphocytes Relative: 92 %
Lymphs Abs: 28.1 10*3/uL — ABNORMAL HIGH (ref 0.7–4.0)
MCH: 34 pg (ref 26.0–34.0)
MCHC: 33.1 g/dL (ref 30.0–36.0)
MCV: 102.7 fL — ABNORMAL HIGH (ref 80.0–100.0)
Monocytes Absolute: 0.1 10*3/uL (ref 0.1–1.0)
Monocytes Relative: 0 %
Neutro Abs: 2.2 10*3/uL (ref 1.7–7.7)
Neutrophils Relative %: 7 %
Platelet Count: 92 10*3/uL — ABNORMAL LOW (ref 150–400)
RBC: 2.94 MIL/uL — ABNORMAL LOW (ref 4.22–5.81)
RDW: 16 % — ABNORMAL HIGH (ref 11.5–15.5)
WBC Count: 30.7 10*3/uL — ABNORMAL HIGH (ref 4.0–10.5)
nRBC: 0 % (ref 0.0–0.2)

## 2025-01-01 LAB — CMP (CANCER CENTER ONLY)
ALT: 11 U/L (ref 0–44)
AST: 14 U/L — ABNORMAL LOW (ref 15–41)
Albumin: 4 g/dL (ref 3.5–5.0)
Alkaline Phosphatase: 40 U/L (ref 38–126)
Anion gap: 10 (ref 5–15)
BUN: 38 mg/dL — ABNORMAL HIGH (ref 8–23)
CO2: 26 mmol/L (ref 22–32)
Calcium: 10 mg/dL (ref 8.9–10.3)
Chloride: 102 mmol/L (ref 98–111)
Creatinine: 1.45 mg/dL — ABNORMAL HIGH (ref 0.61–1.24)
GFR, Estimated: 49 mL/min — ABNORMAL LOW
Glucose, Bld: 75 mg/dL (ref 70–99)
Potassium: 3.9 mmol/L (ref 3.5–5.1)
Sodium: 138 mmol/L (ref 135–145)
Total Bilirubin: 0.4 mg/dL (ref 0.0–1.2)
Total Protein: 8.9 g/dL — ABNORMAL HIGH (ref 6.5–8.1)

## 2025-01-01 LAB — PROTIME-INR
INR: 1.8 — ABNORMAL HIGH (ref 0.8–1.2)
Prothrombin Time: 22.1 s — ABNORMAL HIGH (ref 11.4–15.2)

## 2025-01-02 ENCOUNTER — Other Ambulatory Visit: Payer: Self-pay

## 2025-01-04 ENCOUNTER — Inpatient Hospital Stay: Payer: MEDICARE

## 2025-01-04 ENCOUNTER — Inpatient Hospital Stay: Payer: MEDICARE | Admitting: Nurse Practitioner

## 2025-01-08 ENCOUNTER — Inpatient Hospital Stay: Payer: MEDICARE | Admitting: Dietician

## 2025-01-14 ENCOUNTER — Inpatient Hospital Stay: Payer: MEDICARE

## 2025-01-14 ENCOUNTER — Ambulatory Visit (HOSPITAL_BASED_OUTPATIENT_CLINIC_OR_DEPARTMENT_OTHER): Payer: MEDICARE | Admitting: Pulmonary Disease

## 2025-01-15 ENCOUNTER — Inpatient Hospital Stay: Payer: MEDICARE

## 2025-01-23 ENCOUNTER — Inpatient Hospital Stay (HOSPITAL_BASED_OUTPATIENT_CLINIC_OR_DEPARTMENT_OTHER): Payer: MEDICARE | Admitting: Family Medicine

## 2025-01-29 ENCOUNTER — Inpatient Hospital Stay: Payer: MEDICARE | Admitting: Nurse Practitioner

## 2025-01-29 ENCOUNTER — Inpatient Hospital Stay: Payer: MEDICARE

## 2025-02-26 ENCOUNTER — Encounter (HOSPITAL_BASED_OUTPATIENT_CLINIC_OR_DEPARTMENT_OTHER): Payer: MEDICARE
